# Patient Record
Sex: Female | Born: 1950
Health system: Southern US, Community
[De-identification: ages and names within clinical notes are randomized; demographics above are authoritative.]

## PROBLEM LIST (undated history)

## (undated) ENCOUNTER — Emergency Department (HOSPITAL_COMMUNITY): Admission: EM | Discharge status: Absent without leave | Payer: Self-pay

## (undated) DIAGNOSIS — R011 Cardiac murmur, unspecified: Secondary | ICD-10-CM

## (undated) DIAGNOSIS — E785 Hyperlipidemia, unspecified: Secondary | ICD-10-CM

## (undated) DIAGNOSIS — I119 Hypertensive heart disease without heart failure: Secondary | ICD-10-CM

## (undated) DIAGNOSIS — I2510 Atherosclerotic heart disease of native coronary artery without angina pectoris: Secondary | ICD-10-CM

## (undated) DIAGNOSIS — C801 Malignant (primary) neoplasm, unspecified: Secondary | ICD-10-CM

## (undated) DIAGNOSIS — I10 Essential (primary) hypertension: Secondary | ICD-10-CM

## (undated) DIAGNOSIS — I471 Supraventricular tachycardia, unspecified: Secondary | ICD-10-CM

## (undated) DIAGNOSIS — IMO0001 Reserved for inherently not codable concepts without codable children: Secondary | ICD-10-CM

## (undated) DIAGNOSIS — F419 Anxiety disorder, unspecified: Secondary | ICD-10-CM

## (undated) DIAGNOSIS — F329 Major depressive disorder, single episode, unspecified: Secondary | ICD-10-CM

## (undated) DIAGNOSIS — H269 Unspecified cataract: Secondary | ICD-10-CM

## (undated) DIAGNOSIS — R42 Dizziness and giddiness: Secondary | ICD-10-CM

## (undated) DIAGNOSIS — Z794 Long term (current) use of insulin: Secondary | ICD-10-CM

## (undated) DIAGNOSIS — E1165 Type 2 diabetes mellitus with hyperglycemia: Secondary | ICD-10-CM

## (undated) DIAGNOSIS — Z923 Personal history of irradiation: Secondary | ICD-10-CM

## (undated) DIAGNOSIS — F3289 Other specified depressive episodes: Secondary | ICD-10-CM

## (undated) HISTORY — DX: Anxiety disorder, unspecified: F41.9

## (undated) HISTORY — PX: COLONOSCOPY: SHX174

## (undated) HISTORY — PX: COLON SURGERY: SHX602

## (undated) HISTORY — DX: Dizziness and giddiness: R42

## (undated) HISTORY — DX: Malignant (primary) neoplasm, unspecified: C80.1

## (undated) HISTORY — DX: Unspecified cataract: H26.9

## (undated) HISTORY — PX: OTHER SURGICAL HISTORY: SHX169

## (undated) HISTORY — PX: CORONARY ANGIOPLASTY WITH STENT PLACEMENT: SHX49

---

## 2002-01-01 ENCOUNTER — Encounter: Payer: Self-pay | Admitting: Family Medicine

## 2002-01-01 ENCOUNTER — Inpatient Hospital Stay (HOSPITAL_COMMUNITY): Admission: EM | Admit: 2002-01-01 | Discharge: 2002-01-02 | Payer: Self-pay

## 2002-01-01 ENCOUNTER — Encounter: Payer: Self-pay | Admitting: Emergency Medicine

## 2002-09-14 ENCOUNTER — Emergency Department (HOSPITAL_COMMUNITY): Admission: EM | Admit: 2002-09-14 | Discharge: 2002-09-14 | Payer: Self-pay | Admitting: Emergency Medicine

## 2006-03-14 ENCOUNTER — Encounter: Payer: Self-pay | Admitting: Family Medicine

## 2007-02-26 ENCOUNTER — Emergency Department (HOSPITAL_COMMUNITY): Admission: EM | Admit: 2007-02-26 | Discharge: 2007-02-26 | Payer: Self-pay | Admitting: Emergency Medicine

## 2007-02-26 ENCOUNTER — Ambulatory Visit: Payer: Self-pay | Admitting: Vascular Surgery

## 2007-02-26 ENCOUNTER — Encounter (INDEPENDENT_AMBULATORY_CARE_PROVIDER_SITE_OTHER): Payer: Self-pay | Admitting: Emergency Medicine

## 2007-03-17 ENCOUNTER — Emergency Department (HOSPITAL_COMMUNITY): Admission: EM | Admit: 2007-03-17 | Discharge: 2007-03-17 | Payer: Self-pay | Admitting: Emergency Medicine

## 2007-04-04 ENCOUNTER — Emergency Department (HOSPITAL_COMMUNITY): Admission: EM | Admit: 2007-04-04 | Discharge: 2007-04-05 | Payer: Self-pay | Admitting: Emergency Medicine

## 2008-04-28 ENCOUNTER — Telehealth: Payer: Self-pay | Admitting: Family Medicine

## 2008-04-29 ENCOUNTER — Ambulatory Visit: Payer: Self-pay | Admitting: Family Medicine

## 2008-04-29 DIAGNOSIS — E785 Hyperlipidemia, unspecified: Secondary | ICD-10-CM

## 2008-04-29 DIAGNOSIS — I119 Hypertensive heart disease without heart failure: Secondary | ICD-10-CM

## 2008-04-29 DIAGNOSIS — E1159 Type 2 diabetes mellitus with other circulatory complications: Secondary | ICD-10-CM | POA: Insufficient documentation

## 2008-05-02 LAB — CONVERTED CEMR LAB
ALT: 57 units/L — ABNORMAL HIGH (ref 0–35)
AST: 37 units/L (ref 0–37)
Albumin: 4.5 g/dL (ref 3.5–5.2)
Alkaline Phosphatase: 49 units/L (ref 39–117)
Calcium: 10.3 mg/dL (ref 8.4–10.5)
Cholesterol: 223 mg/dL — ABNORMAL HIGH (ref 0–200)
Creatinine, Ser: 0.63 mg/dL (ref 0.40–1.20)
HDL: 46 mg/dL (ref >39)
Sodium: 140 meq/L (ref 135–145)
Total Bilirubin: 0.6 mg/dL (ref 0.3–1.2)
Total Protein: 7.6 g/dL (ref 6.0–8.3)
Triglycerides: 219 mg/dL — ABNORMAL HIGH (ref <150)

## 2008-06-03 ENCOUNTER — Encounter: Payer: Self-pay | Admitting: Family Medicine

## 2008-06-18 ENCOUNTER — Encounter (INDEPENDENT_AMBULATORY_CARE_PROVIDER_SITE_OTHER): Payer: Self-pay | Admitting: Cardiology

## 2008-06-18 ENCOUNTER — Inpatient Hospital Stay (HOSPITAL_COMMUNITY): Admission: EM | Admit: 2008-06-18 | Discharge: 2008-06-19 | Payer: Self-pay | Admitting: Emergency Medicine

## 2008-06-18 ENCOUNTER — Ambulatory Visit: Payer: Self-pay | Admitting: *Deleted

## 2008-06-23 ENCOUNTER — Emergency Department (HOSPITAL_COMMUNITY): Admission: EM | Admit: 2008-06-23 | Discharge: 2008-06-23 | Payer: Self-pay | Admitting: Emergency Medicine

## 2008-06-24 ENCOUNTER — Inpatient Hospital Stay (HOSPITAL_COMMUNITY): Admission: AD | Admit: 2008-06-24 | Discharge: 2008-06-25 | Payer: Self-pay | Admitting: Cardiology

## 2008-07-01 ENCOUNTER — Encounter: Payer: Self-pay | Admitting: Family Medicine

## 2008-07-15 ENCOUNTER — Inpatient Hospital Stay (HOSPITAL_COMMUNITY): Admission: AD | Admit: 2008-07-15 | Discharge: 2008-07-16 | Payer: Self-pay | Admitting: Cardiology

## 2008-08-23 ENCOUNTER — Emergency Department (HOSPITAL_COMMUNITY): Admission: EM | Admit: 2008-08-23 | Discharge: 2008-08-23 | Payer: Self-pay | Admitting: Emergency Medicine

## 2008-09-15 ENCOUNTER — Ambulatory Visit: Payer: Self-pay | Admitting: Internal Medicine

## 2008-09-15 ENCOUNTER — Inpatient Hospital Stay (HOSPITAL_COMMUNITY): Admission: EM | Admit: 2008-09-15 | Discharge: 2008-09-16 | Payer: Self-pay | Admitting: Emergency Medicine

## 2008-09-28 ENCOUNTER — Ambulatory Visit: Payer: Self-pay | Admitting: Internal Medicine

## 2008-09-28 ENCOUNTER — Observation Stay (HOSPITAL_COMMUNITY): Admission: AD | Admit: 2008-09-28 | Discharge: 2008-09-29 | Payer: Self-pay | Admitting: Internal Medicine

## 2008-09-29 IMAGING — CR DG CHEST 1V PORT
1 series · 1 of 1 positions shown · non-contrast
Comparison: none

CLINICAL DATA: Nausea. Tachycardia. ER patient.
 PORTABLE CHEST - 1 VIEW (1210 hours):

[AP]
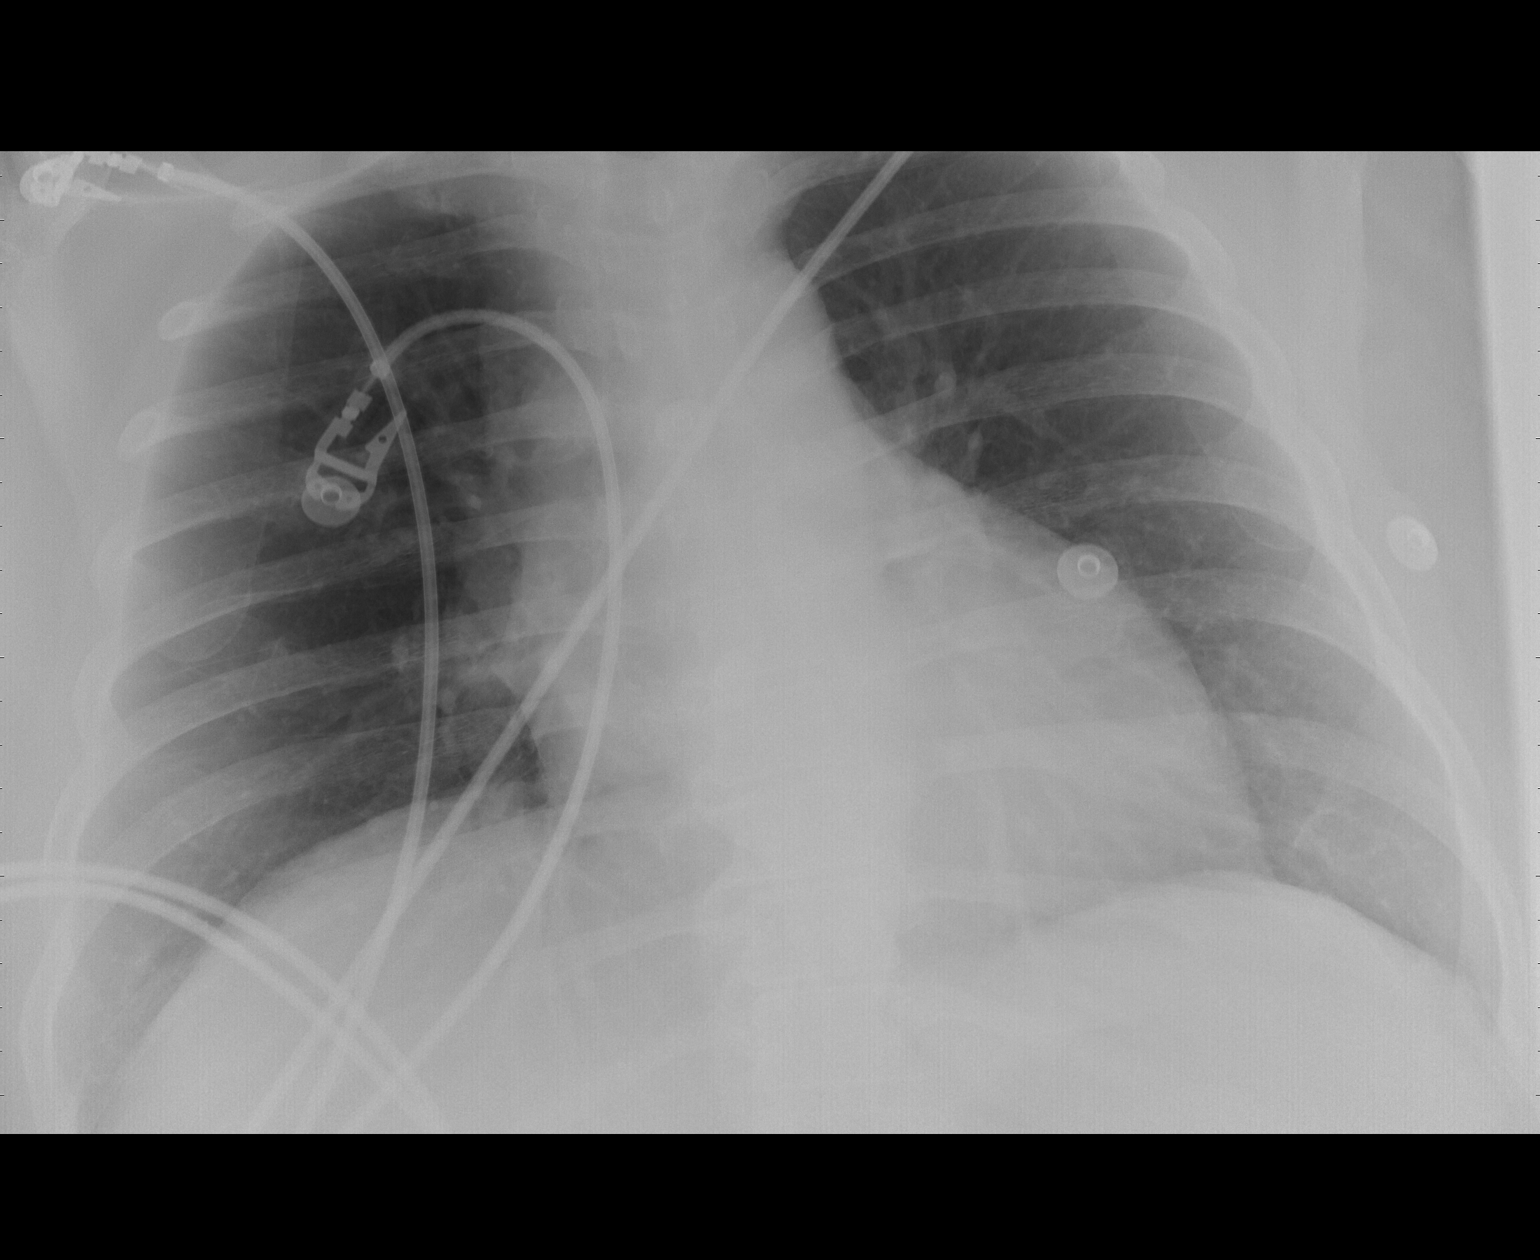

[1 of 1 positions shown; findings below may reference images not displayed]

FINDINGS: Cardiac size is normal for this single view.  No pulmonary vascular congestion or active lung process.
IMPRESSION: No acute chest disease.

## 2008-10-14 ENCOUNTER — Telehealth: Payer: Self-pay | Admitting: Internal Medicine

## 2008-10-15 DIAGNOSIS — Z8659 Personal history of other mental and behavioral disorders: Secondary | ICD-10-CM

## 2008-10-15 DIAGNOSIS — I471 Supraventricular tachycardia: Secondary | ICD-10-CM

## 2008-10-15 DIAGNOSIS — I2510 Atherosclerotic heart disease of native coronary artery without angina pectoris: Secondary | ICD-10-CM | POA: Insufficient documentation

## 2009-02-07 ENCOUNTER — Telehealth: Payer: Self-pay | Admitting: Family Medicine

## 2009-02-22 ENCOUNTER — Telehealth: Payer: Self-pay | Admitting: Family Medicine

## 2009-03-03 ENCOUNTER — Telehealth: Payer: Self-pay | Admitting: Family Medicine

## 2009-04-27 ENCOUNTER — Telehealth: Payer: Self-pay | Admitting: Family Medicine

## 2010-03-06 NOTE — Progress Notes (Signed)
Summary: Plavix BEING MAILED  Phone Note Call from Patient   Caller: Patient  [phone] Reason for Call: Talk to Nurse Summary of Call: Pt  called to adv that the manufacer [facility] [employer] patient assistance) sent her medicine (plavix, 90-day supply) to the wrong place... The med was suppose to go to [facility] but was sent to Dr Caryl Never at old location of [facility].... [facility] then put the med in the mail yesterday (04/26/2009) to be sent to [facility]... We are currently awaiting the arrival of pts med.... She would like to be contacted at [phone] when same arrives at [facility] so she can come and pick med up.   Initial call taken by: Debbra Riding,  April 27, 2009 10:20 AM  Follow-up for Phone Call        Pt informed we did receive the plavix yesterday.  Pt will pick up med next week. Follow-up by: Sid Falcon LPN,  April 27, 2009 10:43 AM

## 2010-03-06 NOTE — Progress Notes (Signed)
Summary: fyi  Phone Note Call from Patient Call back at Home Phone [phone]   Caller: Patient Call For: Evelena Peat MD Summary of Call: pt know longer has ins, she will be getting her care at health department Initial call taken by: Heron Sabins,  February 22, 2009 12:53 PM  Follow-up for Phone Call        Noted. Follow-up by: Evelena Peat MD,  February 22, 2009 12:55 PM

## 2010-03-06 NOTE — Progress Notes (Signed)
Summary: Faxed back unsigned med refill request  Phone Note From Other Clinic   Summary of Call: Received faxed refill requests for Accupril and Zoloft.  Per Dr Caryl Never, pt is no longer being treated by per earlier phone note.  These 2 requests were faxed back to [phone], Blima Singer. Initial call taken by: Sid Falcon LPN,  March 03, 2009 8:20 AM

## 2010-03-06 NOTE — Progress Notes (Signed)
Summary: plavix  Phone Note From Other Clinic   Caller: Morrie Sheldon [phone], [facility] Call For: Clorine Swing Summary of Call: Today got a bottle for a Berneda Piccininni, no date of birth, of #90 Plavix 75mg  once daily from [facility] [employer] patient [facility].  She was former patient here.  Call me back so we can get this to the right patient.   Initial call taken by: Rudy Jew, RN,  February 07, 2009 12:33 PM  Follow-up for Phone Call        This pt had been seen formerly at [facility] but has established here.  We need to call pt to get here med samples.  Pt also needs f/u here as not seen since last march. Follow-up by: Evelena Peat MD,  February 07, 2009 1:28 PM  Additional Follow-up for Phone Call Additional follow up Details #1::        Left message to inform patient. Additional Follow-up by: Rudy Jew, RN,  February 07, 2009 1:51 PM

## 2010-04-11 ENCOUNTER — Other Ambulatory Visit: Payer: Self-pay | Admitting: Obstetrics and Gynecology

## 2010-04-11 ENCOUNTER — Ambulatory Visit (HOSPITAL_COMMUNITY)
Admission: RE | Admit: 2010-04-11 | Discharge: 2010-04-11 | Disposition: A | Discharge status: Home / Self Care | Payer: Self-pay | Source: Ambulatory Visit | Attending: Obstetrics and Gynecology | Admitting: Obstetrics and Gynecology

## 2010-04-11 DIAGNOSIS — R059 Cough, unspecified: Secondary | ICD-10-CM | POA: Insufficient documentation

## 2010-04-11 DIAGNOSIS — R05 Cough: Secondary | ICD-10-CM

## 2010-05-12 LAB — BASIC METABOLIC PANEL
BUN: 12 mg/dL (ref 6–23)
Creatinine, Ser: 0.46 mg/dL (ref 0.4–1.2)
GFR calc non Af Amer: >60 mL/min (ref >60)
Glucose, Bld: 295 mg/dL — ABNORMAL HIGH (ref 70–99)
Potassium: 3.8 mEq/L (ref 3.5–5.1)

## 2010-05-12 LAB — POCT CARDIAC MARKERS
CKMB, poc: 1.3 ng/mL (ref 1.0–8.0)
CKMB, poc: 2.2 ng/mL (ref 1.0–8.0)
Myoglobin, poc: 47.7 ng/mL (ref 12–200)
Troponin i, poc: <0.05 ng/mL (ref 0.00–0.09)
Troponin i, poc: <0.05 ng/mL (ref 0.00–0.09)

## 2010-05-12 LAB — POCT I-STAT, CHEM 8
Chloride: 101 mEq/L (ref 96–112)
HCT: 39 % (ref 36.0–46.0)
Potassium: 4.1 mEq/L (ref 3.5–5.1)

## 2010-05-12 LAB — DIFFERENTIAL
Eosinophils Absolute: 0.1 10*3/uL (ref 0.0–0.7)
Eosinophils Relative: 1 % (ref 0–5)
Lymphs Abs: 1.7 10*3/uL (ref 0.7–4.0)

## 2010-05-12 LAB — COMPREHENSIVE METABOLIC PANEL
ALT: 48 U/L — ABNORMAL HIGH (ref 0–35)
AST: 35 U/L (ref 0–37)
CO2: 25 mEq/L (ref 19–32)
Calcium: 9.6 mg/dL (ref 8.4–10.5)
Chloride: 99 mEq/L (ref 96–112)
GFR calc Af Amer: >60 mL/min (ref >60)
GFR calc non Af Amer: >60 mL/min (ref >60)
Sodium: 136 mEq/L (ref 135–145)
Total Bilirubin: 0.7 mg/dL (ref 0.3–1.2)

## 2010-05-12 LAB — CBC
HCT: 39.9 % (ref 36.0–46.0)
HCT: 32.1 % — ABNORMAL LOW (ref 36.0–46.0)
Hemoglobin: 11.5 g/dL — ABNORMAL LOW (ref 12.0–15.0)
MCHC: 35.9 g/dL (ref 30.0–36.0)
MCV: 93.3 fL (ref 78.0–100.0)
Platelets: 115 10*3/uL — ABNORMAL LOW (ref 150–400)
RBC: 4.24 MIL/uL (ref 3.87–5.11)
RDW: 13.3 % (ref 11.5–15.5)
RDW: 12.7 % (ref 11.5–15.5)
WBC: 7.7 10*3/uL (ref 4.0–10.5)

## 2010-05-12 LAB — CARDIAC PANEL(CRET KIN+CKTOT+MB+TROPI)
CK, MB: 2.6 ng/mL (ref 0.3–4.0)
Total CK: 100 U/L (ref 7–177)
Total CK: 101 U/L (ref 7–177)

## 2010-05-12 LAB — HEMOGLOBIN A1C: Hgb A1c MFr Bld: 9 % — ABNORMAL HIGH (ref 4.6–6.1)

## 2010-05-12 LAB — GLUCOSE, CAPILLARY
Glucose-Capillary: 152 mg/dL — ABNORMAL HIGH (ref 70–99)
Glucose-Capillary: 159 mg/dL — ABNORMAL HIGH (ref 70–99)

## 2010-05-12 LAB — LIPID PANEL
Cholesterol: 147 mg/dL (ref 0–200)
LDL Cholesterol: UNDETERMINED mg/dL (ref 0–99)

## 2010-05-13 LAB — CBC
Hemoglobin: 14.6 g/dL (ref 12.0–15.0)
RBC: 4.42 MIL/uL (ref 3.87–5.11)
RDW: 12.8 % (ref 11.5–15.5)

## 2010-05-13 LAB — POCT I-STAT, CHEM 8
BUN: 19 mg/dL (ref 6–23)
Calcium, Ion: 1.11 mmol/L — ABNORMAL LOW (ref 1.12–1.32)
Chloride: 98 mEq/L (ref 96–112)

## 2010-05-13 LAB — DIFFERENTIAL
Basophils Absolute: 0 10*3/uL (ref 0.0–0.1)
Lymphocytes Relative: 21 % (ref 12–46)
Monocytes Absolute: 0.6 10*3/uL (ref 0.1–1.0)
Neutro Abs: 5.1 10*3/uL (ref 1.7–7.7)

## 2010-05-14 LAB — BASIC METABOLIC PANEL
Calcium: 8.8 mg/dL (ref 8.4–10.5)
Chloride: 105 mEq/L (ref 96–112)
Creatinine, Ser: 0.51 mg/dL (ref 0.4–1.2)
GFR calc Af Amer: >60 mL/min (ref >60)
GFR calc non Af Amer: >60 mL/min (ref >60)

## 2010-05-14 LAB — CBC
RBC: 3.89 MIL/uL (ref 3.87–5.11)
WBC: 4.7 10*3/uL (ref 4.0–10.5)

## 2010-05-14 LAB — GLUCOSE, CAPILLARY
Glucose-Capillary: 135 mg/dL — ABNORMAL HIGH (ref 70–99)
Glucose-Capillary: 172 mg/dL — ABNORMAL HIGH (ref 70–99)
Glucose-Capillary: 210 mg/dL — ABNORMAL HIGH (ref 70–99)

## 2010-05-15 LAB — URINALYSIS, ROUTINE W REFLEX MICROSCOPIC
Bilirubin Urine: NEGATIVE
Glucose, UA: >1000 mg/dL — AB
Hgb urine dipstick: NEGATIVE
Ketones, ur: 15 mg/dL — AB
Protein, ur: NEGATIVE mg/dL
pH: 6 (ref 5.0–8.0)

## 2010-05-15 LAB — GLUCOSE, CAPILLARY
Glucose-Capillary: 169 mg/dL — ABNORMAL HIGH (ref 70–99)
Glucose-Capillary: 179 mg/dL — ABNORMAL HIGH (ref 70–99)
Glucose-Capillary: 184 mg/dL — ABNORMAL HIGH (ref 70–99)
Glucose-Capillary: 300 mg/dL — ABNORMAL HIGH (ref 70–99)
Glucose-Capillary: 44 mg/dL — ABNORMAL LOW (ref 70–99)
Glucose-Capillary: 103 mg/dL — ABNORMAL HIGH (ref 70–99)

## 2010-05-15 LAB — CARDIAC PANEL(CRET KIN+CKTOT+MB+TROPI)
CK, MB: 12.2 ng/mL — ABNORMAL HIGH (ref 0.3–4.0)
CK, MB: 20.6 ng/mL — ABNORMAL HIGH (ref 0.3–4.0)
CK, MB: 28.5 ng/mL — ABNORMAL HIGH (ref 0.3–4.0)
Relative Index: 10.6 — ABNORMAL HIGH (ref 0.0–2.5)
Relative Index: 6.8 — ABNORMAL HIGH (ref 0.0–2.5)
Total CK: 126 U/L (ref 7–177)
Total CK: 180 U/L — ABNORMAL HIGH (ref 7–177)
Total CK: 229 U/L — ABNORMAL HIGH (ref 7–177)
Total CK: 268 U/L — ABNORMAL HIGH (ref 7–177)
Troponin I: 1.43 ng/mL (ref 0.00–0.06)
Troponin I: 2.62 ng/mL (ref 0.00–0.06)

## 2010-05-15 LAB — DIFFERENTIAL
Basophils Absolute: 0 10*3/uL (ref 0.0–0.1)
Basophils Relative: 0 % (ref 0–1)
Basophils Relative: 0 % (ref 0–1)
Basophils Relative: 0 % (ref 0–1)
Eosinophils Absolute: 0.1 10*3/uL (ref 0.0–0.7)
Eosinophils Absolute: 0.1 10*3/uL (ref 0.0–0.7)
Eosinophils Absolute: 0.1 10*3/uL (ref 0.0–0.7)
Eosinophils Relative: 1 % (ref 0–5)
Eosinophils Relative: 2 % (ref 0–5)
Lymphocytes Relative: 26 % (ref 12–46)
Lymphs Abs: 1.5 10*3/uL (ref 0.7–4.0)
Monocytes Relative: 9 % (ref 3–12)
Monocytes Relative: 7 % (ref 3–12)
Neutro Abs: 4.9 10*3/uL (ref 1.7–7.7)
Neutrophils Relative %: 57 % (ref 43–77)
Neutrophils Relative %: 70 % (ref 43–77)

## 2010-05-15 LAB — BASIC METABOLIC PANEL
BUN: 13 mg/dL (ref 6–23)
BUN: 19 mg/dL (ref 6–23)
CO2: 23 mEq/L (ref 19–32)
CO2: 27 mEq/L (ref 19–32)
Chloride: 102 mEq/L (ref 96–112)
Chloride: 102 mEq/L (ref 96–112)
Creatinine, Ser: 0.71 mg/dL (ref 0.4–1.2)
GFR calc Af Amer: >60 mL/min (ref >60)
GFR calc non Af Amer: >60 mL/min (ref >60)
GFR calc non Af Amer: >60 mL/min (ref >60)
Glucose, Bld: 212 mg/dL — ABNORMAL HIGH (ref 70–99)
Potassium: 3.7 mEq/L (ref 3.5–5.1)
Potassium: 4 mEq/L (ref 3.5–5.1)
Potassium: 3.9 mEq/L (ref 3.5–5.1)
Sodium: 139 mEq/L (ref 135–145)

## 2010-05-15 LAB — CK TOTAL AND CKMB (NOT AT ARMC)
Relative Index: 7.3 — ABNORMAL HIGH (ref 0.0–2.5)
Total CK: 273 U/L — ABNORMAL HIGH (ref 7–177)

## 2010-05-15 LAB — POCT CARDIAC MARKERS
CKMB, poc: 1 ng/mL (ref 1.0–8.0)
Myoglobin, poc: 74 ng/mL (ref 12–200)
Troponin i, poc: <0.05 ng/mL (ref 0.00–0.09)

## 2010-05-15 LAB — CBC
HCT: 33.5 % — ABNORMAL LOW (ref 36.0–46.0)
HCT: 41.1 % (ref 36.0–46.0)
HCT: 38.7 % (ref 36.0–46.0)
MCHC: 35.1 g/dL (ref 30.0–36.0)
MCHC: 35.8 g/dL (ref 30.0–36.0)
MCHC: 35.9 g/dL (ref 30.0–36.0)
MCV: 92.5 fL (ref 78.0–100.0)
MCV: 93.2 fL (ref 78.0–100.0)
MCV: 93.3 fL (ref 78.0–100.0)
Platelets: 110 10*3/uL — ABNORMAL LOW (ref 150–400)
Platelets: 117 10*3/uL — ABNORMAL LOW (ref 150–400)
Platelets: 128 10*3/uL — ABNORMAL LOW (ref 150–400)
RBC: 4.41 MIL/uL (ref 3.87–5.11)
RBC: 4.15 MIL/uL (ref 3.87–5.11)
RDW: 12.7 % (ref 11.5–15.5)
WBC: 7 10*3/uL (ref 4.0–10.5)

## 2010-05-15 LAB — LIPID PANEL
Cholesterol: 142 mg/dL (ref 0–200)
LDL Cholesterol: 83 mg/dL (ref 0–99)

## 2010-05-15 LAB — URINE MICROSCOPIC-ADD ON

## 2010-05-15 LAB — TSH: TSH: 1.046 u[IU]/mL (ref 0.350–4.500)

## 2010-05-15 LAB — PROTIME-INR: INR: 1 (ref 0.00–1.49)

## 2010-05-15 LAB — TROPONIN I
Troponin I: 1.27 ng/mL (ref 0.00–0.06)
Troponin I: 3.23 ng/mL (ref 0.00–0.06)

## 2010-06-19 NOTE — H&P (Signed)
[name], [name]               ACCOUNT NO.:  [id number]   MEDICAL RECORD NO.:  [id number]          PATIENT TYPE:  INP   LOCATION:  4705                         FACILITY:  [facility]   PHYSICIAN:  Armanda Magic, M.D.     DATE OF BIRTH:  [birth year]   DATE OF ADMISSION:  09/15/2008  DATE OF DISCHARGE:                              HISTORY & PHYSICAL   REFERRING PHYSICIAN:  Donnetta Hutching, MD   CHIEF COMPLAINT:  Chest pain and palpitations.   HISTORY OF PRESENT ILLNESS:  This is a 60 year old white female with a  history of coronary disease and SVT who presented to the emergency room  with complaints of chest pain or palpitations.  Apparently around 1  o'clock today while at school helping with Bible [employer] at church, she  noticed acute onset of chest pressure and then developed diaphoresis.  She then started to go home but then decided to go to the emergency  room.  On the way to the emergency room, she broke out in palpitations  about 5-10 minutes after the chest pressure started.  By arrival in the  emergency room, she was still having palpitations and EKG showed SVT at  157 beats per minute, most likely AVNRT.  She was given adenosine 6 mg  IV, then 12 mg IV, and converted to sinus rhythm.  Immediately after  conversion to sinus rhythm, her chest pain terminated.   PAST MEDICAL HISTORY:  1. Coronary disease status post non-ST elevation MI in May 2010 with      PCI of the left circumflex on Jun 24, 2008.  She then had a PCI of      the RCA on July 15, 2008.  2. 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  3. Diabetes mellitus.  4. Hypertension.  5. Depression.   PAST SURGICAL HISTORY:  PCI.   MEDICATIONS:  1. Lantus insulin 90 mg at bedtime.  2. Metformin 500 mg b.i.d.  3. Accupril 20 mg at bedtime.  4. Plavix 75 mg a day.  5. Metoprolol 50 mg in the morning and 25 mg in the evening.  6. Simvastatin 40 mg daily.  7. Zoloft 50 mg at bedtime.  8. Aspirin 325 mg a day.  9. Fish oil 3000 mg daily.  10.Hydrochlorothiazide 12.5 mg a day.   ALLERGIES:  None.   SOCIAL HISTORY:  She has one son.  She is single.  She denies tobacco or  alcohol use.   FAMILY HISTORY:  Noncontributory.   REVIEW OF SYSTEMS:  Otherwise stated in HPI is negative.   PHYSICAL EXAMINATION:  VITAL SIGNS:  Blood pressure is 106/47, heart  rate 74, respirations 20, and O2 saturation is 99% on 2 L O2.  GENERAL:  This is a well-developed, well-nourished female in no acute  distress.  HEENT:  Benign.  NECK:  Supple without lymphadenopathy.  Carotid upstrokes +2  bilaterally.  No bruits.  LUNGS:  Clear to auscultation throughout.  HEART:  Regular rate and rhythm.  No murmurs, rubs, or gallops.  Normal  S1-S2.  ABDOMEN:  Soft, nontender, and nondistended with active bowel sounds.  No  hepatosplenomegaly.  EXTREMITIES:  No edema.   LABORATORY DATA:  Myoglobin 78.  CPK-MB 2.2 and troponin less than 0.05.  Sodium 136, potassium 4.1, chloride 101, bicarb 24, BUN 28, creatinine  0.4, and glucose 351.  Chest x-ray shows questionable left lower lobe  retrocardiac airspace opacity, recommending further evaluation.  EKG #1  shows SVT 157 beats per minute with no ST changes.  She is now back in  sinus rhythm.   ASSESSMENT:  1. Chest pain with initial cardiac markers negative.  There was no      ischemic changes on EKG during supraventricular tachycardia.  2. Coronary artery disease status post non-ST elevation myocardial      infarction with percutaneous coronary intervention of the left      circumflex in May 2010 and percutaneous coronary intervention of      the right coronary artery in June 2010, on aspirin and Plavix.  3. Supraventricular tachycardia, now in normal sinus rhythm after      adenosine.  She has had multiple emergency room admissions for      supraventricular tachycardia which appears to be atrioventricular      nodal reentrant tachycardia.  4. Diabetes mellitus.  5. Dyslipidemia.  6.  Hypertension.   PLAN:  Admit to telemetry to rule out MI with serial cardiac enzymes,  subcu Lovenox per Pharmacy, ED consult to evaluate possible ablation.  We cannot increase her beta-blocker further secondary to decreased blood  pressure.  Sliding-scale insulin coverage.  We will continue  nitroglycerin paste 1 inch anterior chest wall q.6 h. Until she rules  out.  Continue aspirin and Plavix.  We will check a chest CT to further  evaluate abnormal chest x-ray and rule out PE as well.      Armanda Magic, M.D.  Electronically Signed     TT/MEDQ  D:  09/15/2008  T:  09/16/2008  Job:  [id number]

## 2010-06-19 NOTE — Consult Note (Signed)
[name], [name]               ACCOUNT NO.:  [id number]   MEDICAL RECORD NO.:  [id number]          PATIENT TYPE:  INP   LOCATION:  4705                         FACILITY:  [facility]   PHYSICIAN:  Doylene Canning. Ladona Ridgel, MD    DATE OF BIRTH:  08/04/1950   DATE OF CONSULTATION:  09/16/2008  DATE OF DISCHARGE:  09/16/2008                                 CONSULTATION   REQUESTING PHYSICIAN:  Armanda Magic, MD   INDICATION FOR CONSULTATION:  Evaluation of recurrent episodes of  supraventricular tachycardia.   HISTORY OF PRESENT ILLNESS:  The patient is a 60 year old woman with  known coronary artery disease and SVT.  She has had multiple  hospitalizations in the last year, both for SVT and coronary artery  disease with chest pain.  She sustained a non ST-segment elevation MI in  May 2010 and ultimately underwent percutaneous intervention of her left  circumflex and her right coronary artery.  The patient's SVT starts and  stop suddenly.  It has been terminated on multiple occasions with  intravenous adenosine and only began occurring approximately 3 years  ago.  She specifically denies SVT symptoms as a teenager or as a young  adult.  The patient states when she has SVT, she has pressure on her  chest and she gets short of breath.  She feels palpitations.  She has  had no frank syncope.  She has been on escalating doses of beta-  blockers, but despite this she has continued have episodes of SVT.   PAST MEDICAL HISTORY:  Also notable for diabetes, hypertension, and a  history of depression.   SOCIAL HISTORY:  The patient has one child.  She is single.  She denies  tobacco or ethanol abuse.   PHYSICAL EXAMINATION:  Noncontributory advanced age, but is negative for  SVT.   REVIEW OF SYSTEMS:  The patient has minimal arthritic complaints.  She  has occasional anginal symptoms.  She has tachy palpitations as  previously noted with her SVT.  She has problems with excess weight and  has been  obese for a while.  Otherwise, all systems are reviewed and  negative except as noted in the HPI.   PHYSICAL EXAMINATION:  GENERAL:  She is a pleasant middle-aged obese  woman in no acute distress.  VITAL SIGNS:  Blood pressure was 110/50.  The pulse was 80 and regular,  respirations were 18, temperature was 98.  HEENT:  Normocephalic and atraumatic.  Pupils are round.  Oropharynx  moist.  Sclerae anicteric.  NECK:  Mo jugular distention.  There is no thyromegaly.  Trachea is  midline.  Carotid are 2+ and symmetric.  LUNGS:  Clear bilaterally to auscultation.  No wheezes, rales, or  rhonchi are present.  There is no increased work of breathing.  CARDIOVASCULAR:  Regular rate and rhythm.  Normal S1 and S2.  There are  no murmurs, rubs, or gallops.  ABDOMEN:  Soft, nontender, nondistended, obese.  There is no obvious  organomegaly.  EXTREMITIES:  No cyanosis, clubbing or edema.  The pulses 2+ and  symmetric.  NEUROLOGIC:  Alert  and oriented x3.  Cranial nerves intact.  Strength  was 5/5 and symmetric.   DIAGNOSTIC STUDIES:  The EKG demonstrates sinus rhythm with no evidence  of preexcitation.  EKGs during SVT demonstrates a narrow QRS, short RP  tachycardia around 60 beats per minute.   IMPRESSION:  1. Recurrent symptomatic supraventricular tachycardia despite medical      therapy.  2. Coronary artery disease status post intervention and stents.   DISCUSSION:  I have discussed treatment options with the patient.  The  risks, benefits, goals, and expectations of electrophysiology study and  catheter ablation were discussed with the patient.  She is willing to  proceed with this procedure though she would like to do it as an  outpatient.  I will allow the patient to be discharged home and have her  rescheduled in the next several weeks.      Doylene Canning. Ladona Ridgel, MD  Electronically Signed     GWT/MEDQ  D:  09/16/2008  T:  09/17/2008  Job:  [id number]   cc:   Armanda Magic, M.D.

## 2010-06-19 NOTE — H&P (Signed)
[name], [name]               ACCOUNT NO.:  [id number]   MEDICAL RECORD NO.:  [id number]          PATIENT TYPE:  INP   LOCATION:  4735                         FACILITY:  [facility]   PHYSICIAN:  Jennelle Human. Marisue Humble, MD DATE OF BIRTH:  1950-09-28   DATE OF ADMISSION:  06/18/2008  DATE OF DISCHARGE:                              HISTORY & PHYSICAL   PRIMARY CARE PHYSICIAN:  Danielle Peat, MD, with [facility].   CHIEF COMPLAINT:  Supraventricular tachycardia.   HISTORY OF PRESENT ILLNESS:  This is a 60 year old lady with history of  tachyarrhythmias with which she had an episode today that began around  10:00 p.m., it felt as if her heart was pounding for about 30-40 minutes  before paramedics arrived and administered adenosine x2 doses.  She  subsequently broke as it appeared she was in AVNRT.  She had  premedicated with 4 baby aspirins and nitroglycerin x3, although she  does not know of any coronary artery disease.  She states that she has  had these episodes approximately 3-4 times before and they did not start  until approximately 10 years ago.  She had no chest pain, diaphoresis,  or shortness of breath.   PAST MEDICAL HISTORY:  1. Diabetes mellitus x15 years, on insulin.  2. Hyperlipidemia, combined hypertriglyceridemia and      hypercholesterolemia.  3. History of SVT.   ALLERGIES:  No known drug allergies.   MEDICATIONS:  1. Lantus 90 units nightly.  2. Accupril 20 mg daily.  3. Zoloft 50 mg daily.  4. Simvastatin 20 mg daily.  5. Metformin 500 mg b.i.d.   SOCIAL HISTORY:  She lives in [place] with her husband and her son.  She is currently retired.  No tobacco or alcohol use.   Family history is not significant for any history of early heart  disease; however, there is stroke, COPD, and diabetes that runs in the  family.   Review of systems is negative x10 except for that stated in the HPI.   PHYSICAL EXAMINATION:  VITAL SIGNS:  She is  afebrile, pulse is 80,  respirations 24, blood pressure 126/64.  GENERAL:  She is alert, awake, and oriented x3.  HEENT:  Normal.  NECK:  Normal jugular venous pressure.  Carotid upstrokes normal.  No  bruits.  CARDIOVASCULAR:  Regular rate and rhythm without murmurs, gallops, or  rubs.  LUNGS:  Clear to auscultation bilaterally.  GI:  Soft, nondistended, nontender.  Good bowel sounds.  No  hepatosplenomegaly.  EXTREMITIES:  No clubbing, cyanosis, or edema.  Dorsalis pedis,  posterior tibial pulses are normal.   Chest x-ray is normal.  ECG shows a normal sinus rhythm with a leftward  axis and poor R-wave progression.  Reviewing the strips from the field  shows that there is an RSR prime pattern during the tachycardia that is  not present in her native EKG, thus this is consistent with AVNRT.   LABORATORY DATA:  Sodium is 139, potassium is 3.9, chloride 102, bicarb  26, BUN 16, creatinine 0.57, glucose 301.  First set of cardiac enzymes,  total CK is 292, MB is 21.3, and troponin is 1.27.   ASSESSMENT AND PLAN:  1. Atrioventricular nodal reentrant tachycardia with elevated cardiac      biomarkers.  Her episode lasted approximately 30-40 minutes, and      her elevated cardiac biomarkers are likely secondary to demand      ischemia.  She will likely need an echocardiogram to assess for      structural changes.  However, I think the most important thing for      her is to get a nuclear stress test to evaluate for underlying flow-      limiting ischemic disease.  2. Diabetes mellitus.  We will continue her current management.  3. Hyperlipidemia.  We will continue her current regimen and get a      fasting lipid panel in the morning.      Jennelle Human Marisue Humble, MD  Electronically Signed     GBS/MEDQ  D:  06/18/2008  T:  06/18/2008  Job:  [id number]

## 2010-06-19 NOTE — H&P (Signed)
[name], [name]               ACCOUNT NO.:  [id number]   MEDICAL RECORD NO.:  [id number]          PATIENT TYPE:  OIB   LOCATION:  2505                         FACILITY:  [facility]   PHYSICIAN:  [name], M.D.  DATE OF BIRTH:  [birth year]   DATE OF ADMISSION:  06/24/2008  DATE OF DISCHARGE:                              HISTORY & PHYSICAL   CHIEF COMPLAINT:  Palpitations and recent elevated troponin.   [name] is a 60 year old female who was initially admitted to [facility] on Jun 18, 2008, with SVT.  She did have some elevated  troponins and we felt this was secondary to demand ischemia.  Ultimately  her SVT resolved and she had no further chest pain and we felt that it  was safe for her to go home and come back today for cardiac  catheterization.  While lying on the cath table, she does tell us that  she had a come back in last night with SVT, had to get a shot, and  then she was let to go home.   PAST MEDICAL HISTORY:  1. Diabetes mellitus.  2. Hyperlipidemia.  3. SVT.  4. Long-term medication use.  5. Depression.   ALLERGIES:  No known drug allergies.   MEDICATIONS:  1. Lantus 90 units subcu q.h.s.  2. Accupril 20 mg a day.  3. Zoloft 50 mg a day.  4. Simvastatin 20 mg a day.  5. Metformin 500 mg p.o. b.i.d. (she has held this for a week).   SOCIAL HISTORY:  She lives in [place] with her husband and her son.  She is currently retired.  She denies any tobacco, alcohol, or illicit  drug use.   FAMILY HISTORY:  Not significant for any early heart disease, but there  is a history of stroke, COPD, and diabetes that runs in the family.   Glucose 179, BUN 18, creatinine 0.5, sodium 137, potassium 4.2,  hemoglobin 14.3, hematocrit 40.3.  White count 6.3, platelets 137.  PT  12.3, INR 1.12.   PHYSICAL EXAMINATION:  VITAL SIGNS:  Pulse 90, blood pressure 138/75,  respirations 20, and O2 saturation is 97% on room air.  HEENT:  Grossly normal.  No carotid or  subclavian bruits.  No JVD or  thyromegaly.  Sclerae clear.  Conjunctivae normal.  Nares without  drainage.  CHEST:  Clear to auscultation bilaterally.  No wheezing or rhonchi.  HEART:  Regular rate and rhythm.  No obvious murmur, rub, or ectopy.  ABDOMEN:  Good bowel sounds.  Nontender and nondistended.  No masses.  No bruits.  EXTREMITIES:  No peripheral edema.  SKIN:  Warm and dry.  NEUROLOGIC:  Cranial nerves II through XII grossly intact.   ASSESSMENT AND PLAN:  1. Supraventricular tachycardia.  2. Recent elevated troponin consistent with myocardial      demand/ischemia.  3. Diabetes mellitus.  4. Hyperlipidemia.   We will wait 48 hours, then restart her metformin.  She is being  admitted today for cardiac catheterization with possible percutaneous  intervention as well as arrhythmia control.      [name],  P.A.      [name], M.D.  Electronically Signed    LB/MEDQ  D:  06/24/2008  T:  06/25/2008  Job:  [id number]

## 2010-06-19 NOTE — Consult Note (Signed)
[name], [name]               ACCOUNT NO.:  [id number]   MEDICAL RECORD NO.:  [id number]          PATIENT TYPE:  INP   LOCATION:                               FACILITY:  [facility]   PHYSICIAN:  Doylene Canning. Ladona Ridgel, MD    DATE OF BIRTH:  01-15-1951   DATE OF CONSULTATION:  09/16/2008  DATE OF DISCHARGE:                                 CONSULTATION   PRESENTING CIRCUMSTANCE:  My heart has been racing again.   HISTORY OF PRESENT ILLNESS:  [name] is a 60 year old female.  She  has a history of palpitations and heart racing.  Her first experience  with this occurred back in 2003.  She thought she had been under a lot  of stress.  She felt a range of symptoms which included palpitation,  shortness of breath, nausea, diaphoresis, flushing, and chest pressure,  but no dizziness, no presyncope, or syncope.  By the time she got to the  emergency room, the symptoms had abated and the patient was released the  next day.  Since that time, she has had recurrence of these tachy  palpitations off and they have lasted about 20-30 minutes.  Usually when  she would rest and relax, they would dissipate.  For the longest time  since 2003, they have occurred about one time a month   However, lately the symptoms of her heart racing have increased and they  have not abated with any procedures that she was familiar with.  Once  again, the symptoms are chest pressure, shortness of breath, fatigue,  diaphoresis, nausea, decreased exercise tolerance.  She relates at least  4 visits to the emergency room in the year 2010 alone.  She presented on  September 15, 2008, once again with these symptoms.  She required adenosine  6 mg and a 12 mg dose to abate these symptoms, and she says she has been  using chemical prophylaxis all through this year.   Of note, the patient did have 2 other admissions this year, once in May  for STEMI.  At that time, catheterization was done on Jun 24, 2008, that  showed two-vessel  coronary artery disease.  At the May catheterization,  she received a left circumflex stent.  At the subsequent catheterization  on July 15, 2008, she received a right coronary artery stent.  The  patient is on Plavix and aspirin for 1 year.  The patient also has a  history of diabetes insulin dependent, hypertension, and depression.   SOCIAL HISTORY:  The patient lives in [place].  She is divorced.  She does not smoke, take alcoholic beverages, or use drugs.   FAMILY HISTORY:  Her father died at age 76 of CHF and COPD.  Mother had  schizophrenia.  She has 2 sisters, both of whom do have obesity but no  coronary artery disease.   REVIEW OF SYSTEMS:  GENERAL:  The patient is not complaining of fevers,  chills, sweats when she is in a rapid tachy palpitations.  No weight  gain or loss unintentionally.  No adenopathy.  HEENT:  No epistaxis.  No  hoarseness.  No vertigo.  No photophobia.  No diplopia.  INTEGUMENT:  No  rashes.  No nonhealing ulcerations.  CARDIOPULMONARY:  Chest pressure  when she has tachy palpitations and shortness of breath.  Otherwise, she  is not dyspneic on exertion.  No orthopnea.  No paroxysmal nocturnal  dyspnea.  She does not relate edema of the lower extremities or  claudication with walking.  Definite palpitation when the patient goes  into her rapid supraventricular tachycardia.  No presyncope or syncope.  UROGENITAL:  No dysuria.  No hematuria.  Occasional nocturia.  NEUROPSYCHIATRIC:  No weakness, numbness.  She does have depression.  Somewhat anxious.  MUSCULOSKELETAL:  No arthralgias.  No effusions or  deformities.  GASTROINTESTINAL:  The patient does not have chronic  heartburn.  She is not experiencing nausea, vomiting, or diarrhea  lately.  She has never had hematemesis or melena.  ENDOCRINE:  The  patient has history of diabetes.  This is a risky equivalent as well as  her known coronary artery disease.  She does not follow a diabetic diet.    PHYSICAL EXAMINATION:  VITAL SIGNS:  Temperature 97.8, pulse was 83,  respirations 18, blood pressure 121/74, oxygen saturation 98% on room  air.  The patient's electrocardiogram shows a sinus rhythm.  There is an  electrocardiogram on admission to the emergency room which shows a  narrow complex tachycardia 158 beats per minute, consistent with SVT.  GENERAL:  Alert and oriented x3 in no acute distress.  HEENT:  Eyes, pupils equal, round, reactive to light.  Extraocular  movements are intact.  The sclerae are anicteric.  Nares are patent.  Oropharynx shows the mucous membranes are moist.  Dentition preserved.  No plaques, exudates, or erythema.  NECK:  Supple.  No carotid bruits auscultated.  No jugular venous  distention.  No cervical lymphadenopathy.  HEART:  Regular rate and rhythm without murmur.  No rubs or gallops.  PULMONARY:  Lungs are clear to auscultation bilaterally.  INTEGUMENT:  No rashes or lesions.  ABDOMEN:  Soft, nondistended.  Bowel sounds are present.  Deeper  structures not palpated.  UROGENITAL AND RECTAL:  Deferred.  EXTREMITIES:  No evidence of clubbing, cyanosis, edema.  MUSCULOSKELETAL:  No joint deformity or effusions.  NEUROLOGIC:  Alert and oriented x3.  Cranial nerves II through XII  grossly intact.  Grip strength preserved bilaterally.  Gait is normal.   CT of the chest on September 15, 2008, no pulmonary embolism.  Chest x-ray  on September 15, 2008, left lower lobe opacity, but this is not confirmed  on CT scan.  Once again electrocardiogram shows sinus rhythm with narrow  QRS.  QTc is not prolonged, and there is an electrocardiogram showing  her SVT.   LABORATORY STUDIES:  White cells 5.3, hemoglobin 11.5, hematocrit 32.1,  platelets are 298.  Sodium 136, potassium 3.8, chloride 99, carbonate  25, glucose is 345, BUN is 24, creatinine 0.63.  TSH is 0.464.  HGB A1c  is 9.  BNP this admission is 70.  Protime 13.3, INR 1.0.  Total  cholesterol 147,  triglycerides 569, HDL cholesterol 29, LDL cannot  calculate secondary to high triglycerides.  Troponin I studies are  negative x3.   Medications for this patient are as follows:  1. Lantus 90 units subcutaneously at bedtime.  2. Metformin 500 mg b.i.d.  3. Accupril 20 mg at bedtime.  4. Plavix 75 mg daily.  5. Metoprolol 50 mg q.a.m. and 25 mg  q.p.m.  6. Simvastatin 40 mg at bedtime.  7. Zoloft 50 mg daily.  8. Enteric-coated aspirin 325 mg daily.  9. Fish oil capsule 1000 mg twice daily.  10.Hydrochlorothiazide 12.5 mg daily.   The patient was seen by Dr. Ladona Ridgel who analyzed the patient's history  and the electrocardiogram.  Dr. Ladona Ridgel is happy to offer this patient  ablation of her SVT.  The risks and benefits of the procedure have been  described to the patient, and she wishes to proceed.  This procedure  will be done on September 28, 2008.  She will present to Short Stay at 7:30  in the morning.  She will be n.p.o.  IV fluids will be started.  Her  metformin and Lantus will be held.  Other medications she may take in  the morning.  She is asked to stop her metoprolol with the 50 mg a.m.  dose on Tuesday, September 28, 2008, not to take the 25 mg p.m. dose.   Once again, this patient has symptomatic supraventricular tachycardia.  Symptoms include chest pressure, shortness of breath, diaphoresis,  palpitation.      Maple Mirza, PA      Doylene Canning. Ladona Ridgel, MD  Electronically Signed    GM/MEDQ  D:  09/16/2008  T:  09/17/2008  Job:  [id number]

## 2010-06-19 NOTE — Op Note (Signed)
[name], [name]               ACCOUNT NO.:  [id number]   MEDICAL RECORD NO.:  [id number]          PATIENT TYPE:  INP   LOCATION:  4729                         FACILITY:  [facility]   PHYSICIAN:  Doylene Canning. Ladona Ridgel, MD    DATE OF BIRTH:  01/23/1951   DATE OF PROCEDURE:  09/28/2008  DATE OF DISCHARGE:                               OPERATIVE REPORT   PROCEDURE PERFORMED:  Electrophysiologic study and RF catheter ablation  of arteriovenous nodal reentrant tachycardia.   INTRODUCTION:  The patient is a 60 year old woman who had a several-year  history of recurrent tachy palpitations and documented SVT rates over  160 beats per minute.  These have been incessant as of late and  terminated with adenosine.   PROCEDURE:  After informed consent was obtained, the patient was taken  to the diagnostic EP lab in a fasting state.  After usual preparation  and draping, intravenous fentanyl and midazolam were given for sedation.  A 6-French hexapolar catheter was inserted percutaneously into the right  jugular vein and advanced to the coronary sinus.  A 5-French quadripolar  catheter was inserted percutaneously in the right femoral vein and  advanced to the right ventricle.  A 5-French quadripolar catheter was  inserted percutaneously in the right femoral vein and advanced to the  His bundle region under fluoroscopic guidance.  After measurement of the  basic intervals, rapid ventricular pacing was carried out from the RV  apex demonstrating VA Wenckebach at 300 milliseconds.  During rapid  ventricular pacing, the atrial activation sequence was midline and  decremental.  Next, programmed ventricular stimulation was carried out  from the RV apex at a base drive cycle length of 540 milliseconds.  The  S1-S2 interval was stepwise decreased down to 320 milliseconds where  ventricular refractoriness was observed.  During programmed ventricular  stimulation, the atrial activation sequence was again midline  and  decremental.  Next, programmed atrial stimulation was carried out from  the coronary sinus at a base drive cycle length of 086 milliseconds and  stepwise decreased down to 330 milliseconds resulting in the initiation  of SVT.  This was nonsustained AVNRT.  Next, rapid atrial pacing was  carried out from the coronary sinus and the high right atrium at a base  drive cycle length of 761 milliseconds and stepwise decreased down to  320 milliseconds where AV Wenckebach was observed.  During rapid atrial  pacing, the PR interval was initially greater than the RR interval  followed by the initiation of SVT.  At this point, unfortunately the SVT  was nonsustained typically lasting 6-8 beats.  Isoproterenol was then  infused at a mcg per minute, and while it was being infused, a 7-French  quadripolar ablation catheter was advanced by way of the right femoral  vein into the right atrium under fluoroscopic guidance.  Mapping was  carried out in the right atrium.  Alycia Rossetti triangle was unusually small and  anteriorly displaced.  After additional mapping, two RF energy  applications were subsequently delivered and this resulted in prolonged  accelerated junctional rhythm.  At this point, low-dose  isoproterenol 1  mcg per minute was infused and additional rapid atrial pacing and  programmed atrial stimulation of the right atrium and then the right  ventricle carried out and in the coronary sinus.  This demonstrated no  inducible SVT and no evidence of any residual slow pathway conduction.  The patient was observed for approximately 30 minutes and during this  time, additional rapid atrial pacing was carried out with and without  isoproterenol resulting in no inducible SVT and furthermore, no evidence  of any residual slow pathway conduction.  At this point, the catheters  were removed.  Hemostasis assured and the patient was returned to her  room in satisfactory condition.   COMPLICATIONS:  There  were no immediate procedure complications.   RESULTS:  A.  Baseline ECG.  Baseline ECG demonstrates sinus rhythm with  normal axis and intervals.  B.  Baseline intervals.  The sinus node cycle length was 688  milliseconds.  The HV interval was 50 milliseconds, the QRS duration was  81 milliseconds, the AH interval was 100 milliseconds.  C.  Rapid ventricular pacing.  Rapid ventricular pacing was carried from  the RV apex demonstrating a VA Wenckebach cycle length of 300  milliseconds.  During rapid ventricular pacing, the atrial activation  sequence was midline and decremental.  D.  Programmed ventricular stimulation.  Programmed ventricular  stimulation was carried out from the RV apex at base drive cycle length  of 347 milliseconds.  The S1-S2 interval was stepwise decreased from 400  milliseconds down to 220 milliseconds where ventricular refractoriness  was observed.  During programmed ventricular stimulation, the atrial  activation sequence was midline and decremental.  E.  Rapid atrial pacing.  Rapid atrial pacing was carried out from the  coronary sinus and high right atrium and stepwise decreased down to 320  milliseconds resulting in initiation of AV nodal reentrant tachycardia.  The tachycardia, however, was nonsustained.  F.  Programmed atrial stimulation.  Programmed atrial stimulation was  carried out from the coronary sinus and the high right atrium, base  drive cycle length of 425 milliseconds.  The S1-S2 interval stepwise  decreased down to 330 milliseconds where the AV node ERP was observed.  During programmed atrial stimulation, there were jumps and echo beats  and inducible SVT.  Following ablation, there were neither.  G.  Arrhythmia was observed.  1. AV nodal reentrant tachycardia initiation was with rapid atrial      pacing and programmed atrial stimulation.  The duration was      sustained.  Termination was spontaneous.  Cycle length was around      320  milliseconds.      a.     Mapping.  Mapping of the patient's SVT demonstrated a very       small Koch triangle which was anteriorly displaced.      b.     RF energy application.  Total of two RF energy applications       were delivered resulting in prolonged accelerated junctional       rhythm.  There was intact AV conduction following the ablation,       although the slow pathway was no longer present.   CONCLUSION:  This study demonstrates successful electrophysiology study  and RF catheter ablation of AV nodal reentrant tachycardia with a total  of 2 RF energy applications delivered to sites 7 and 8 in Saxon triangle  rendering the SVT noninducible.      Doylene Canning.  Ladona Ridgel, MD  Electronically Signed     GWT/MEDQ  D:  09/28/2008  T:  09/29/2008  Job:  [id number]   cc:   Armanda Magic, M.D.

## 2010-06-22 NOTE — Discharge Summary (Signed)
NAMEHARGUN, SPURLING               ACCOUNT NO.:  [id number]   MEDICAL RECORD NO.:  [id number]          PATIENT TYPE:  INP   LOCATION:  4735                         FACILITY:  [facility]   PHYSICIAN:  Francisca December, M.D.  DATE OF BIRTH:  01-17-1951   DATE OF ADMISSION:  06/18/2008  DATE OF DISCHARGE:  06/19/2008                               DISCHARGE SUMMARY   DISCHARGE DIAGNOSES:  1. Atrioventricular nodal reentrant tachycardia, resolved.  2. Elevated troponin, felt to be secondary to atrioventricular nodal      reentrant tachycardia.  3. Diabetes mellitus.  4. Hyperlipidemia.  5. Longterm medication use.   HOSPITAL COURSE:  [name] is a 60 year old female who is admitted to  the hospital on Jun 18, 2008, after having an episode of tachycardia.  She said it felt as if her heart was pounding for about 30-40 minutes.  Paramedics arrived and administered adenosine x2 doses.  She  subsequently broke as it appears she was in AVNRT.  She came into the  hospital for observation and her troponin was elevated at 2.62.  The  troponins seem to be a little high, just as in an SVT, so we wanted to  do a cardiac catheterization on the patient.  She was hesitant to have  this done, instead she will let us do an echo on her that showed normal  systolic function.  Images were inadequate for LV wall motion  assessment.   She has been discharged to home in stable, but improved condition.   LABORATORY STUDIES:  BUN 16, creatinine 0.57, hemoglobin 13.8, and  hematocrit 38.7.   DISCHARGE MEDICATIONS:  1. Lantus as before admission.  2. Accupril 20 mg a day.  3. Zoloft 50 mg a day.  4. Simvastatin 20 mg a day.  5. Metoprolol 25 mg twice a day.  6. Plavix 75 mg a day.  7. Enteric-coated aspirin 325 mg a day.  8. She was encouraged not to take the Glucophage at this time.   She is to remain on a low-sodium heart-healthy diabetic diet.  Increase  activity slowly.  Dr. Amil Amen will call the  patient for an appointment.      Guy Franco, P.A.      Francisca December, M.D.  Electronically Signed    LB/MEDQ  D:  08/02/2008  T:  08/02/2008  Job:  [id number]

## 2010-06-22 NOTE — H&P (Signed)
NAMESONNY, POTH                           ACCOUNT NO.:  [id number]   MEDICAL RECORD NO.:  [id number]                   PATIENT TYPE:  INP   LOCATION:  1824                                 FACILITY:  [facility]   PHYSICIAN:  Cassell Clement, M.D.              DATE OF BIRTH:  13-Aug-1950   DATE OF ADMISSION:  01/01/2002  DATE OF DISCHARGE:                                HISTORY & PHYSICAL   CHIEF COMPLAINT:  Chest discomfort.   HISTORY:  This is a 60 year old Caucasian female referred from [facility] today to the emergency room because of abnormal EKG and  chest pressure.  The patient states that she has not felt well for about ten  days.  About ten days ago, she felt that her heart had become overstressed  and she felt tachycardia and pressure.  She felt that if she cut back on her  workload and rested that her symptoms would improve.  She has tried to do  that over the past ten days with no improvement.   Today, she went to [facility], where her electrocardiogram  was done and showed some nonspecific changes, and she was referred to [facility] and brought by EMS.  The patient does not have any history of  clear cut angina pectoris with exertion.  There has been a feeling of  squeezing in the throat but no radiation into the arms.  There was dyspnea,  but no nausea or vomiting.  The patient has had a history of palpitations  and racing of the heart.   SOCIAL HISTORY:  Revealed that she has been divorced for twenty years.  She  has a 33 year old son.  She works as a Administrator, arts.  She enjoys  her work but finds it to be stressful at times.  She does not use alcohol or  tobacco.  She lives in [place] and enjoys gardening.  She avoids  caffeine and, although a diabetic, has been on a regular diet.  She has had  a history of elevated triglycerides in the past.   PAST MEDICAL HISTORY:  She has had no operations.  She was  hospitalized  years ago for exhaustion.   ALLERGIES:  No known drug allergies.   MEDICATIONS:  1. Lantus insulin 120 units at h.s.  2. Avandia 8 mg each a.m. and, by mistake, she has been taking 16 mg each     a.m..  3. Glucovance 5/500, one b.i.d.  4. Zoloft 50 mg h.s.  5. Altace 2.5 mg daily.  6. Aspirin 325 mg daily.   REVIEW OF SYSTEMS:  She denies any history of exertional chest pain.  There  has been no history of congestive heart failure or lung congestion or fluid  accumulation.  Gastrointestinal reveals no history of ulcers.  Appetite is  good, weight is 170 pounds.  Genitourinary reveals no dysuria.  Remainder of  review of systems is negative in detail.   FAMILY HISTORY:  The patient's father died of CHF and COPD at age 2.  Her  mother is living but is in a nursing home with schizophrenia.  She has  several overweight siblings who have not had premature coronary disease.   PHYSICAL EXAMINATION:  VITAL SIGNS:  Blood pressure is 130/65, pulse is 92  and regular.  Respirations are normal.  GENERAL:  Reveals an obese, Caucasian female in no acute distress.  SKIN:  Is warm and dry.  HEENT:  Pupils are equal and reactive to light and accommodation.  Extraocular movements are full.  Sclerae are clear.  Fundi show no  hemorrhages or exudate.  Mouth and pharynx:  Normal.  NECK:  Jugulovenous pressure normal.  Carotids normal.  CHEST:  Clear.  HEART:  Normal first and second heart sound.  There is no S3.  There is a  very faint apical systolic murmur grade 1/6.  ABDOMEN:  Soft and nontender without hepatosplenomegaly or masses.  EXTREMITIES:  Show good peripheral pulses and no phlebitis or edema.   LABORATORY DATA:  Her electrocardiogram shows normal sinus rhythm,  nonspecific ST-T wave abnormalities with low voltage but no acute ischemia  change.   Chest x-ray shows normal heart size and clear lungs.   IMPRESSION:  1. Chest discomfort with dyspnea; rule out myocardial  infarction.  2. Diabetes mellitus.  3. Hyperlipidemia and exogenous obesity, all consistent with a metabolic     syndrome.   DISPOSITION:  Admit to telemetry.  Will get serial enzymes.  Anticipate home  on November 29, if initial studies are negative.                                               Cassell Clement, M.D.    TB/MEDQ  D:  01/01/2002  T:  01/01/2002  Job:  [id number]   cc:   Evelena Peat, M.D.  [address]  [place]  [place] [address]  Fax: [phone]

## 2010-06-22 NOTE — Discharge Summary (Signed)
[name], [name]                           ACCOUNT NO.:  [id number]   MEDICAL RECORD NO.:  [id number]                   PATIENT TYPE:  INP   LOCATION:  2031                                 FACILITY:  [facility]   PHYSICIAN:  Cassell Clement, M.D.              DATE OF BIRTH:  10-29-1950   DATE OF ADMISSION:  01/01/2002  DATE OF DISCHARGE:  01/02/2002                                 DISCHARGE SUMMARY   HISTORY:  This is a 60 year old woman admitted through the emergency room  with chest pressure. She began having trouble with her heart about 10 days  ago, and she attributed at that time to increased stress. She noted that her  heart appeared to be racing, and she felt some squeezing in her throat.  There was mild dyspnea but no nausea. There was no left arm radiation. For  the last 10 days, she has felt somewhat more weak and fatigued. She became  concerned on the day of admission and went to [facility]  where she was evaluated, and electrocardiogram was felt to be abnormal, and  she was referred to [facility] ER and brought here by EMS. History reveals that  she does not smoke or drink. She is on a regular diet but is a diabetic and  has a history of hypertriglyceridemia.   PRESENT MEDICATIONS:  1. Lantus insulin 20 units a day.  2. Avandia 8 mg daily.  3. Glucovance 5/500 one twice a day.  4. Zoloft 50 mg daily.  5. Altace 2.5 mg daily.  6. Ecotrin at 325 daily.   FAMILY HISTORY:  She has a positive family history for heart problems.   REVIEW OF SYMPTOMS:  Positive for obesity and diabetes, and she is not  careful with her diet.   PHYSICAL EXAMINATION:  Unremarkable except for a soft systolic ejection  murmur at the left sternal edge.   LABORATORY DATA:  The chest x-ray showed normal heart size, clear lungs. EKG  showed low voltage QRS but otherwise unremarkable EKG. There were minimal  nonspecific ST-T wave abnormalities but no acute ischemic changes.   HOSPITAL COURSE:  The patient was admitted to telemetry. Serial enzymes were  obtained and showed no evidence of myocardial dysfunction or damage. By the  day after admission which was 01/02/02, rhythm had remained stable  overnight, a repeat EKG was normal, and her exam was unchanged except that  the systolic murmur was slightly louder in sitting position. Thyroid studies  and lipid studies are still pending at the time of this dictation.   This being the weekend, it was felt it would be reasonable to discharge the  patient home to be followed up in the office. Depending on clinical course,  we may wish to do a Cardiolite study on her as an outpatient and also  consider a 2-D echocardiogram if heart murmur remains a concern.    DISCHARGE  INSTRUCTIONS:  The patient is being discharged on her same  medication except that we have increased her Altace to 5 mg daily, and we  have added Nitrostat 1/150 sublingually p.r.n. She is to continue on a more  strict diet and must loose weight. She will be rechecked in the office in  two weeks or sooner p.r.n.                                               Cassell Clement, M.D.    TB/MEDQ  D:  01/02/2002  T:  01/02/2002  Job:  [id number]   cc:   Evelena Peat, M.D.  [address]  [place]  [place] [address]  Fax: [phone]

## 2010-10-25 LAB — APTT: aPTT: 25

## 2010-10-25 LAB — I-STAT 8, (EC8 V) (CONVERTED LAB)
Acid-base deficit: 4 — ABNORMAL HIGH
BUN: 12
Bicarbonate: 19.7 — ABNORMAL LOW
Chloride: 107
Glucose, Bld: 158 — ABNORMAL HIGH
HCT: 35 — ABNORMAL LOW
Hemoglobin: 11.9 — ABNORMAL LOW
Operator id: 198171
Potassium: 4
Sodium: 136
TCO2: 21
pCO2, Ven: 31 — ABNORMAL LOW
pH, Ven: 7.411 — ABNORMAL HIGH

## 2010-10-25 LAB — PROTIME-INR: INR: 1

## 2010-10-25 LAB — POCT I-STAT CREATININE
Creatinine, Ser: 0.4
Operator id: 198171

## 2010-10-25 LAB — CBC
MCV: 92.1
RBC: 3.76 — ABNORMAL LOW
WBC: 8.6

## 2010-10-25 LAB — DIFFERENTIAL
Eosinophils Absolute: 0.1
Lymphocytes Relative: 18
Lymphs Abs: 1.6
Monocytes Relative: 8
Neutrophils Relative %: 72

## 2010-10-26 LAB — POCT CARDIAC MARKERS
CKMB, poc: 1.8
Operator id: 277751
Troponin i, poc: <0.05

## 2010-10-26 LAB — DIFFERENTIAL
Basophils Relative: 0
Eosinophils Absolute: 0.1
Eosinophils Relative: 2
Neutrophils Relative %: 66

## 2010-10-26 LAB — I-STAT 8, (EC8 V) (CONVERTED LAB)
Acid-base deficit: 3 — ABNORMAL HIGH
Glucose, Bld: 184 — ABNORMAL HIGH
TCO2: 24
pCO2, Ven: 39.7 — ABNORMAL LOW
pH, Ven: 7.359 — ABNORMAL HIGH

## 2010-10-26 LAB — CBC
MCHC: 35.2
MCV: 92
Platelets: 152
RDW: 13.4

## 2010-10-26 LAB — POCT I-STAT CREATININE
Creatinine, Ser: 0.6
Operator id: 277751

## 2010-10-29 LAB — I-STAT 8, (EC8 V) (CONVERTED LAB)
Acid-base deficit: 2
Chloride: 103
HCT: 38
Hemoglobin: 12.9
Operator id: 222501
Potassium: 3.8
Sodium: 136
TCO2: 23
pH, Ven: 7.414 — ABNORMAL HIGH

## 2010-10-29 LAB — POCT CARDIAC MARKERS
CKMB, poc: <1 — ABNORMAL LOW
Myoglobin, poc: 25.7
Myoglobin, poc: 32.5
Operator id: 222501
Troponin i, poc: <0.05

## 2010-10-29 LAB — DIFFERENTIAL
Basophils Absolute: 0
Eosinophils Relative: 2
Lymphocytes Relative: 33
Lymphs Abs: 2.1
Neutro Abs: 3.6

## 2010-10-29 LAB — POCT I-STAT CREATININE: Operator id: 222501

## 2010-10-29 LAB — CBC
HCT: 36.6
Platelets: 135 — ABNORMAL LOW
RDW: 13.6
WBC: 6.5

## 2010-10-29 LAB — D-DIMER, QUANTITATIVE: D-Dimer, Quant: 0.39

## 2011-01-24 ENCOUNTER — Other Ambulatory Visit: Payer: Self-pay

## 2011-01-24 ENCOUNTER — Emergency Department (HOSPITAL_COMMUNITY)
Admission: EM | Admit: 2011-01-24 | Discharge: 2011-01-24 | Disposition: A | Discharge status: Home / Self Care | Payer: Self-pay | Attending: Emergency Medicine | Admitting: Emergency Medicine

## 2011-01-24 DIAGNOSIS — M5430 Sciatica, unspecified side: Secondary | ICD-10-CM | POA: Insufficient documentation

## 2011-01-24 DIAGNOSIS — I10 Essential (primary) hypertension: Secondary | ICD-10-CM | POA: Insufficient documentation

## 2011-01-24 DIAGNOSIS — Z794 Long term (current) use of insulin: Secondary | ICD-10-CM | POA: Insufficient documentation

## 2011-01-24 DIAGNOSIS — E119 Type 2 diabetes mellitus without complications: Secondary | ICD-10-CM | POA: Insufficient documentation

## 2011-01-24 DIAGNOSIS — R110 Nausea: Secondary | ICD-10-CM | POA: Insufficient documentation

## 2011-01-24 DIAGNOSIS — M25559 Pain in unspecified hip: Secondary | ICD-10-CM | POA: Insufficient documentation

## 2011-01-24 DIAGNOSIS — M79609 Pain in unspecified limb: Secondary | ICD-10-CM | POA: Insufficient documentation

## 2011-01-24 DIAGNOSIS — I252 Old myocardial infarction: Secondary | ICD-10-CM | POA: Insufficient documentation

## 2011-01-24 DIAGNOSIS — R61 Generalized hyperhidrosis: Secondary | ICD-10-CM | POA: Insufficient documentation

## 2011-01-24 DIAGNOSIS — R42 Dizziness and giddiness: Secondary | ICD-10-CM | POA: Insufficient documentation

## 2011-01-24 HISTORY — DX: Essential (primary) hypertension: I10

## 2011-01-24 LAB — BASIC METABOLIC PANEL
Calcium: 9.3 mg/dL (ref 8.4–10.5)
Creatinine, Ser: 0.38 mg/dL — ABNORMAL LOW (ref 0.50–1.10)
GFR calc non Af Amer: >90 mL/min (ref >90)
Glucose, Bld: 339 mg/dL — ABNORMAL HIGH (ref 70–99)
Sodium: 133 mEq/L — ABNORMAL LOW (ref 135–145)

## 2011-01-24 LAB — DIFFERENTIAL
Basophils Absolute: 0 10*3/uL (ref 0.0–0.1)
Eosinophils Absolute: 0.1 10*3/uL (ref 0.0–0.7)
Eosinophils Relative: 1 % (ref 0–5)
Monocytes Absolute: 0.5 10*3/uL (ref 0.1–1.0)

## 2011-01-24 LAB — CBC
MCH: 32.4 pg (ref 26.0–34.0)
MCV: 89 fL (ref 78.0–100.0)
Platelets: 114 10*3/uL — ABNORMAL LOW (ref 150–400)
RDW: 13.2 % (ref 11.5–15.5)

## 2011-01-24 LAB — POCT I-STAT TROPONIN I: Troponin i, poc: 0.01 ng/mL (ref 0.00–0.08)

## 2011-01-24 LAB — GLUCOSE, CAPILLARY
Glucose-Capillary: 343 mg/dL — ABNORMAL HIGH (ref 70–99)
Glucose-Capillary: 272 mg/dL — ABNORMAL HIGH (ref 70–99)

## 2011-01-24 MED ORDER — FENTANYL CITRATE 0.05 MG/ML IJ SOLN
50.0000 ug | Freq: Once | INTRAMUSCULAR | Status: AC
  Administered 2011-01-24: 50 ug via INTRAVENOUS
  Filled 2011-01-24: qty 2

## 2011-01-24 MED ORDER — DIAZEPAM 5 MG/ML IJ SOLN
5.0000 mg | Freq: Once | INTRAMUSCULAR | Status: AC
  Administered 2011-01-24: 5 mg via INTRAVENOUS
  Filled 2011-01-24: qty 2

## 2011-01-24 MED ORDER — ONDANSETRON HCL 4 MG/2ML IJ SOLN
4.0000 mg | Freq: Once | INTRAMUSCULAR | Status: AC
  Administered 2011-01-24: 4 mg via INTRAVENOUS
  Filled 2011-01-24: qty 2

## 2011-01-24 MED ORDER — KETOROLAC TROMETHAMINE 30 MG/ML IJ SOLN
30.0000 mg | Freq: Once | INTRAMUSCULAR | Status: AC
  Administered 2011-01-24: 30 mg via INTRAVENOUS
  Filled 2011-01-24: qty 1

## 2011-01-24 MED ORDER — NAPROXEN 500 MG PO TABS: 500.0000 mg | ORAL_TABLET | Freq: Two times a day (BID) | ORAL | Status: AC

## 2011-01-24 MED ORDER — SODIUM CHLORIDE 0.9 % IV BOLUS (SEPSIS)
1000.0000 mL | Freq: Once | INTRAVENOUS | Status: AC
  Administered 2011-01-24: 1000 mL via INTRAVENOUS

## 2011-01-24 MED ORDER — HYDROCODONE-ACETAMINOPHEN 5-500 MG PO TABS: 1.0000 | ORAL_TABLET | Freq: Four times a day (QID) | ORAL | Status: AC | PRN

## 2011-01-24 NOTE — ED Provider Notes (Signed)
Medical screening examination/treatment/procedure(s) were conducted as a shared visit with non-physician practitioner(s) and myself.  I personally evaluated the patient during the encounter  L hip and leg pain x 4 days, acutely worsened today and was associated with nausea, dizziness, and flushing.  Similar symptoms to previous MI but without chest pain.  No weakness, numbness, tingling, bowel or bladder incontinence.  5/5 strength throughout, bilateral great toes upgoing.  EKG without changes  Glynn Octave, MD 01/24/11 [phone]

## 2011-01-24 NOTE — ED Notes (Signed)
Catheter intact on IV removal

## 2011-01-24 NOTE — ED Notes (Signed)
Notified PA CBG [id number]

## 2011-01-24 NOTE — ED Notes (Signed)
Notified RN CBG [id number]

## 2011-01-24 NOTE — ED Notes (Signed)
Patient presents with left hip pain x 3 days with pain now radiating down left leg since last night.  Patient also reported a sudden onset of dizziness, diaphoresis and nausea this AM.

## 2011-01-24 NOTE — ED Provider Notes (Signed)
History     CSN: [id number]  Arrival date & time 01/24/11  1054   First MD Initiated Contact with Patient 01/24/11 1111      Chief Complaint  Patient presents with  . Dizziness  . Nausea  . Hip Pain    (Consider location/radiation/quality/duration/timing/severity/associated sxs/prior treatment) HPI  Patient with history of non-STEMI MI in May of 2010 with stent placement x 2 is followed by [facility] cardiology presents to emergency department complaining of a four-day history of left lateral hip/buttocks pain with radiation of pain down into left inner thigh with radiation to into left lower leg towards great toe stating that the pain was not improved with Tylenol however after taking a hot bath at 5 AM temporary resolution of pain until she was sitting in her desk chair at 9 AM doing office work when she had acute onset sharp pain in her hip followed by nausea, dizziness, and then feeling flushed and diaphoretic. Patient states that when she became flushed and diaphoretic along with the nausea and the dizziness she became worried because she had her in 9 she had similar symptoms however at that time she also had "crushing chest pain" and patient denies any chest pain throughout episode today. Patient states since arriving to the emergency room in laying down the dizziness and nausea has significantly improved as well as her hip pain. Patient denies any known history of injury to her hip, loss of bowel or bladder function, saddle seat paresthesias, extremity weakness/tingling/numbness, skin changes of extremity to include swelling, redness, or heat. Pain in hip aggravated by movement and prolonged sitting and improved with hot bath and lying down. Patient denies visual changes, headache, facial droop, slurred speech, extremity weakness, or loss of coordination. Patient states dizziness was like a "light headedness" denying the room spinning about.   Past Medical History  Diagnosis Date  .  Diabetes mellitus   . Hypertension     Past Surgical History  Procedure Date  . Coronary angioplasty with stent placement     History reviewed. No pertinent family history.  History  Substance Use Topics  . Smoking status: Never Smoker   . Smokeless tobacco: Not on file  . Alcohol Use: No    OB History    Grav Para Term Preterm Abortions TAB SAB Ect Mult Living                  Review of Systems  All other systems reviewed and are negative.    Allergies  Review of patient's allergies indicates no known allergies.  Home Medications   Current Outpatient Rx  Name Route Sig Dispense Refill  . ASPIRIN EC 81 MG PO TBEC Oral Take 81 mg by mouth at bedtime.      . CLOPIDOGREL BISULFATE 75 MG PO TABS Oral Take 75 mg by mouth daily.      . OMEGA-3 FATTY ACIDS 1000 MG PO CAPS Oral Take 2 g by mouth 2 (two) times daily.      Marland Kitchen HYDROCHLOROTHIAZIDE 12.5 MG PO CAPS Oral Take 12.5 mg by mouth daily.      . INSULIN DETEMIR 100 UNIT/ML Burr SOLN Subcutaneous Inject 100 Units into the skin at bedtime.      Marland Kitchen METFORMIN HCL 500 MG PO TABS Oral Take 500 mg by mouth 2 (two) times daily with a meal.      . METOPROLOL TARTRATE 50 MG PO TABS Oral Take 50 mg by mouth 2 (two) times daily.      Marland Kitchen  QUINAPRIL HCL 20 MG PO TABS Oral Take 20 mg by mouth at bedtime.      . SERTRALINE HCL 50 MG PO TABS Oral Take 50 mg by mouth daily.      Marland Kitchen SIMVASTATIN 40 MG PO TABS Oral Take 40 mg by mouth at bedtime.        BP 147/92  Pulse 65  Temp(Src) 98.5 F (36.9 C) (Oral)  Resp 16  SpO2 100%  Physical Exam  Nursing note and vitals reviewed. Constitutional: She is oriented to person, place, and time. She appears well-developed and well-nourished. No distress.  HENT:  Head: Normocephalic and atraumatic.  Eyes: Conjunctivae and EOM are normal. Pupils are equal, round, and reactive to light.  Neck: Normal range of motion. Neck supple.  Cardiovascular: Normal rate, regular rhythm, normal heart sounds and  intact distal pulses.  Exam reveals no gallop and no friction rub.   No murmur heard. Pulmonary/Chest: Effort normal and breath sounds normal. No respiratory distress. She has no wheezes. She has no rales. She exhibits no tenderness.  Abdominal: Bowel sounds are normal. She exhibits no distension and no mass. There is no tenderness. There is no rebound and no guarding.  Musculoskeletal: Normal range of motion. She exhibits tenderness. She exhibits no edema.       Tenderness to palpation of left lateral hip/buttocks but no skin changes or crepitus. No tenderness to palpation of entire left lower extremity with no skin changes or edema. Full range of motion of left lower extremity with mild pain in lateral hip but 5 out of 5 strength. No inguinal tenderness to palpation or mass. Good femoral pulses and pedal pulses.  Neurological: She is alert and oriented to person, place, and time. She has normal reflexes. She displays normal reflexes. No cranial nerve deficit. Coordination normal.  Skin: Skin is warm and dry. No rash noted. She is not diaphoretic. No erythema.  Psychiatric: She has a normal mood and affect.    ED Course  Procedures (including critical care time)  IV fentanyl and Zofran, by mouth Valium. IV fluid bolus  IV fentanyl and Toradol.    Date: 01/24/2011  Rate: 65  Rhythm: normal sinus rhythm  QRS Axis: normal  Intervals: normal  ST/T Wave abnormalities: normal, non specific T wave changes  Conduction Disutrbances:none  Narrative Interpretation:   Old EKG Reviewed: compare to Sep 29, 2008    Labs Reviewed  CBC - Abnormal; Notable for the following:    MCHC 36.4 (*)    Platelets 114 (*) PLATELET COUNT CONFIRMED BY SMEAR   All other components within normal limits  BASIC METABOLIC PANEL - Abnormal; Notable for the following:    Sodium 133 (*)    Glucose, Bld 339 (*)    Creatinine, Ser 0.38 (*)    All other components within normal limits  GLUCOSE, CAPILLARY - Abnormal;  Notable for the following:    Glucose-Capillary 343 (*)    All other components within normal limits  GLUCOSE, CAPILLARY - Abnormal; Notable for the following:    Glucose-Capillary 272 (*)    All other components within normal limits  DIFFERENTIAL  POCT I-STAT TROPONIN I  POCT I-STAT TROPONIN I  I-STAT TROPONIN I  POCT CBG MONITORING  I-STAT TROPONIN I  POCT CBG MONITORING   No results found.   1. Sciatica   2. Diabetes mellitus     2:29 PM I spoke with Dr. Garnette Scheuermann for [facility] who states that with out CP throughout  event and patient became symptomatic of nausea, diaphoresis and dizziness acutely after severe pain that it is appropriate to perform 2 sets of troponins with her EKG findings of only slight non specific T wave changes and allow patient to follow up with cardiology as an outpatient with pain management for sciatica by PCP.   MDM  No chest pain throughout ER stay with 2 negative troponins. No signs or symptoms of cauda equina or central cord compression with no known injury to hip. No signs of septic joint. Left lower extremities neurovascularly intact. The neurofocal findings. Patient ambulating without difficulty. Patient is agreeable to following up with her primary care physician for further evaluation and management of her diabetes in for further evaluation and management of sciatica however to return to emergency department for emergent changing or worsening of symptoms as well as following up with cardiology for further evaluation but returning to emergency department for emergent changing or worsening of symptoms.        Jenness Corner, PA 01/24/11 1500

## 2011-03-06 ENCOUNTER — Ambulatory Visit (INDEPENDENT_AMBULATORY_CARE_PROVIDER_SITE_OTHER): Payer: Self-pay | Admitting: Family Medicine

## 2011-03-06 ENCOUNTER — Encounter: Payer: Self-pay | Admitting: Family Medicine

## 2011-03-06 DIAGNOSIS — R42 Dizziness and giddiness: Secondary | ICD-10-CM

## 2011-03-06 DIAGNOSIS — G5700 Lesion of sciatic nerve, unspecified lower limb: Secondary | ICD-10-CM

## 2011-03-06 DIAGNOSIS — E119 Type 2 diabetes mellitus without complications: Secondary | ICD-10-CM

## 2011-03-06 DIAGNOSIS — M5430 Sciatica, unspecified side: Secondary | ICD-10-CM

## 2011-03-06 DIAGNOSIS — I10 Essential (primary) hypertension: Secondary | ICD-10-CM

## 2011-03-06 NOTE — Patient Instructions (Signed)
You need to followup with your primary provider at [facility] Department within the next week or 2 to have blood pressure reassessed with elevated reading today of 180/80

## 2011-03-06 NOTE — Progress Notes (Signed)
  Subjective:    Patient ID: [name], female    DOB: [birth year], 61 y.o.   MRN: [id number]  HPI  Patient has not been seen here in over a couple of years. She has multiple chronic medical problems include type 2 diabetes, hyperlipidemia, depression, hypertension, and CAD history. She has been basically followed at [facility] Department because of lack of insurance and lack of funds. She's had multiple emergency department visits over the past couple of years. Seen basically today for consultation requesting a letter.  She relates she has plane tickets to fly to the [place] in March. However, she had change in health status since her plane ticket purchased and is trying to get a refund. She's had some recent increased vertigo. No hearing changes. Also some left sciatica symptoms. Also her blood pressure is poorly controlled today. She done any recent or current chest pain. She was placed recently on Naprosyn for her sciatica symptoms and that seems to be helping.   Diabetes and poorly controlled. She is followed through the health Department regarding this. History of poor compliance with medications.  Past Medical History  Diagnosis Date  . Diabetes mellitus   . Hypertension    Past Surgical History  Procedure Date  . Coronary angioplasty with stent placement     reports that she has never smoked. She does not have any smokeless tobacco history on file. She reports that she does not drink alcohol or use illicit drugs. family history is not on file. No Known Allergies   Review of Systems  Constitutional: Negative for fatigue.  Eyes: Negative for visual disturbance.  Respiratory: Negative for cough, chest tightness, shortness of breath and wheezing.   Cardiovascular: Negative for chest pain, palpitations and leg swelling.  Musculoskeletal: Positive for back pain.  Neurological: Positive for dizziness. Negative for seizures, syncope, weakness, light-headedness and  headaches.       Objective:   Physical Exam  Constitutional: She is oriented to person, place, and time. She appears well-developed and well-nourished.  HENT:  Mouth/Throat: Oropharynx is clear and moist.  Neck: Neck supple. No thyromegaly present.  Cardiovascular: Normal rate and regular rhythm.   Pulmonary/Chest: Effort normal and breath sounds normal. No respiratory distress. She has no wheezes. She has no rales.  Musculoskeletal: She exhibits no edema.  Lymphadenopathy:    She has no cervical adenopathy.  Neurological: She is alert and oriented to person, place, and time. No cranial nerve deficit.       No ataxia.  Skin: No rash noted.  Psychiatric: She has a normal mood and affect. Her behavior is normal.          Assessment & Plan:  #1 left-sided sciatica symptoms. Improved on naproxen. #2 vertigo with nonfocal exam. Probably benign positional vertigo  #3 hypertension poorly controlled. She is encouraged to followup promptly in the next week or 2 with [facility] Department who is providing her primary care at this time

## 2011-12-23 ENCOUNTER — Encounter: Payer: Self-pay | Admitting: Family Medicine

## 2011-12-23 ENCOUNTER — Ambulatory Visit (INDEPENDENT_AMBULATORY_CARE_PROVIDER_SITE_OTHER): Payer: Medicaid Other | Admitting: Family Medicine

## 2011-12-23 VITALS — BP 130/68 | Temp 98.6°F | Wt 159.0 lb

## 2011-12-23 DIAGNOSIS — K219 Gastro-esophageal reflux disease without esophagitis: Secondary | ICD-10-CM

## 2011-12-23 NOTE — Progress Notes (Signed)
Subjective:     Patient ID: [name], female   DOB: 16-Apr-1950, 61 y.o.   MRN: [id number]  HPI 61 year old with history of T2DM, HTN, hyperlipidemia and CAD here for evaluation of RUQ and post-sternal discomfort.  Describes a burning sensation periodically that extends continuously from RUQ to epigastrium, behind sternum and into the back of her throat.  She has been experiencing this for roughly 3 months and is concerned about it being a gall bladder issue.  Denies that it is colicky, or associated with fever, and she has not vomited.  Sometimes makes her nauseated.  No hematemesis, melena or grossly bloody stool.  No particular aggravating factors, including large meals, or fried/fatty foods.  Has not noticed any relieving factors.  No changes in bowel habits--she describes baseline loose stools as a result of the diabetes medications she takes, including metformin and insulin.  No dysphagia or unexpected weight loss.  Review of Systems  Constitutional: Negative for fever, diaphoresis and unexpected weight change.  Respiratory: Negative for chest tightness and shortness of breath.   Cardiovascular: Negative for chest pain.  Gastrointestinal: Positive for nausea (per HPI) and abdominal pain (as per HPI.). Negative for vomiting, diarrhea, constipation and blood in stool.       Objective:   Physical Exam  Constitutional: She is oriented to person, place, and time. She appears well-developed and well-nourished. She does not appear ill. No distress.  Cardiovascular: Normal rate, regular rhythm and normal heart sounds.   Pulmonary/Chest: Effort normal and breath sounds normal.  Abdominal: Soft. Bowel sounds are normal. She exhibits no distension and no mass. There is no tenderness. There is no rebound and no guarding.  Neurological: She is alert and oriented to person, place, and time.  Skin: Skin is warm and dry.  Psychiatric: She has a normal mood and affect.       Assessment:       61 year old with T2DM, HTN, hyperlipidemia, and CAD here for evaluation of RUQ discomfort.    Plan:     1. RUQ discomfort: likely GERD.  Pt describes discomfort as burning, and denies that it is ever colicky, or radiating to her back, accompanied by fever, worse at night, or that it has caused her to vomit, which make a gallbladder process less likely.  Will continue to monitor for development of gallbladder symptoms however.  Educate on lifestyle modifications for GERD, including limiting spicy or acidic foods, raising the head of the bed 6-8 inches, and losing weight.  Also start daily famotidine or ranitidine for several weeks to help control symptoms.  Follow up if these do not relieve symptoms, or intense, colicky RUQ pain with or without radiation to the back, vomiting, or fever develop, as this would warrant further workup for potential gallbladder process.  Marthann Schiller, MS3     Agree with assessment and plan as per Marthann Schiller, MS 3 She has radiation pattern toward sternum and burning quality which suggest reflux.  No chest tightness or exertional feature to suggest cardiac. Pt needs to lose some weight but she admits current motivation low. Evelena Peat MD

## 2011-12-23 NOTE — Patient Instructions (Addendum)
Try Pepcid (famotidine) or Zantac (ranitidine) everyday for several weeks to see if this helps relieve your symptoms. In addition, see the information below for other ways you may be able to improve your symptoms. If you start to feel sharp right-sided abdominal pain or cramps, fever, vomiting, or pain radiating to your back, follow up immediately for further evaluation.    Gastroesophageal Reflux Disease, Adult Gastroesophageal reflux disease (GERD) happens when acid from your stomach flows up into the esophagus. When acid comes in contact with the esophagus, the acid causes soreness (inflammation) in the esophagus. Over time, GERD may create small holes (ulcers) in the lining of the esophagus. CAUSES   Increased body weight. This puts pressure on the stomach, making acid rise from the stomach into the esophagus.  Smoking. This increases acid production in the stomach.  Drinking alcohol. This causes decreased pressure in the lower esophageal sphincter (valve or ring of muscle between the esophagus and stomach), allowing acid from the stomach into the esophagus.  Late evening meals and a full stomach. This increases pressure and acid production in the stomach.  A malformed lower esophageal sphincter. Sometimes, no cause is found. SYMPTOMS   Burning pain in the lower part of the mid-chest behind the breastbone and in the mid-stomach area. This may occur twice a week or more often.  Trouble swallowing.  Sore throat.  Dry cough.  Asthma-like symptoms including chest tightness, shortness of breath, or wheezing. DIAGNOSIS  Your caregiver may be able to diagnose GERD based on your symptoms. In some cases, X-rays and other tests may be done to check for complications or to check the condition of your stomach and esophagus. TREATMENT  Your caregiver may recommend over-the-counter or prescription medicines to help decrease acid production. Ask your caregiver before starting or adding any new  medicines.  HOME CARE INSTRUCTIONS   Change the factors that you can control. Ask your caregiver for guidance concerning weight loss, quitting smoking, and alcohol consumption.  Avoid foods and drinks that make your symptoms worse, such as:  Caffeine or alcoholic drinks.  Chocolate.  Peppermint or mint flavorings.  Garlic and onions.  Spicy foods.  Citrus fruits, such as oranges, lemons, or limes.  Tomato-based foods such as sauce, chili, salsa, and pizza.  Fried and fatty foods.  Avoid lying down for the 3 hours prior to your bedtime or prior to taking a nap.  Eat small, frequent meals instead of large meals.  Wear loose-fitting clothing. Do not wear anything tight around your waist that causes pressure on your stomach.  Raise the head of your bed 6 to 8 inches with wood blocks to help you sleep. Extra pillows will not help.  Only take over-the-counter or prescription medicines for pain, discomfort, or fever as directed by your caregiver.  Do not take aspirin, ibuprofen, or other nonsteroidal anti-inflammatory drugs (NSAIDs). SEEK IMMEDIATE MEDICAL CARE IF:   You have pain in your arms, neck, jaw, teeth, or back.  Your pain increases or changes in intensity or duration.  You develop nausea, vomiting, or sweating (diaphoresis).  You develop shortness of breath, or you faint.  Your vomit is green, yellow, black, or looks like coffee grounds or blood.  Your stool is red, bloody, or black. These symptoms could be signs of other problems, such as heart disease, gastric bleeding, or esophageal bleeding. MAKE SURE YOU:   Understand these instructions.  Will watch your condition.  Will get help right away if you are not doing well  or get worse. Document Released: 10/31/2004 Document Revised: 04/15/2011 Document Reviewed: 08/10/2010 [facility] Patient Information 2013 [place], [place].   Diet for Gastroesophageal Reflux Disease, Adult Reflux (acid reflux) is when acid  from your stomach flows up into the esophagus. When acid comes in contact with the esophagus, the acid causes irritation and soreness (inflammation) in the esophagus. When reflux happens often or so severely that it causes damage to the esophagus, it is called gastroesophageal reflux disease (GERD). Nutrition therapy can help ease the discomfort of GERD. FOODS OR DRINKS TO AVOID OR LIMIT  Smoking or chewing tobacco. Nicotine is one of the most potent stimulants to acid production in the gastrointestinal tract.  Caffeinated and decaffeinated coffee and black tea.  Regular or low-calorie carbonated beverages or energy drinks (caffeine-free carbonated beverages are allowed).   Strong spices, such as black pepper, white pepper, red pepper, cayenne, curry powder, and chili powder.  Peppermint or spearmint.  Chocolate.  High-fat foods, including meats and fried foods. Extra added fats including oils, butter, salad dressings, and nuts. Limit these to less than 8 tsp per day.  Fruits and vegetables if they are not tolerated, such as citrus fruits or tomatoes.  Alcohol.  Any food that seems to aggravate your condition. If you have questions regarding your diet, call your caregiver or a registered dietitian. OTHER THINGS THAT MAY HELP GERD INCLUDE:   Eating your meals slowly, in a relaxed setting.  Eating 5 to 6 small meals per day instead of 3 large meals.  Eliminating food for a period of time if it causes distress.  Not lying down until 3 hours after eating a meal.  Keeping the head of your bed raised 6 to 9 inches (15 to 23 cm) by using a foam wedge or blocks under the legs of the bed. Lying flat may make symptoms worse.  Being physically active. Weight loss may be helpful in reducing reflux in overweight or obese adults.  Wear loose fitting clothing EXAMPLE MEAL PLAN This meal plan is approximately 2,000 calories based on https://www.[facility]nard.org/ meal planning guidelines. Breakfast    cup cooked oatmeal.  1 cup strawberries.  1 cup low-fat milk.  1 oz almonds. Snack  1 cup cucumber slices.  6 oz yogurt (made from low-fat or fat-free milk). Lunch  2 slice whole-wheat bread.  2 oz sliced [place].  2 tsp mayonnaise.  1 cup blueberries.  1 cup snap peas. Snack  6 whole-wheat crackers.  1 oz string cheese. Dinner   cup brown rice.  1 cup mixed veggies.  1 tsp olive oil.  3 oz grilled fish. Document Released: 01/21/2005 Document Revised: 04/15/2011 Document Reviewed: 12/07/2010 [facility] Patient Information 2013 [place], [place].

## 2012-02-22 ENCOUNTER — Inpatient Hospital Stay (HOSPITAL_COMMUNITY)
Admission: EM | Admit: 2012-02-22 | Discharge: 2012-03-04 | DRG: 234 | Disposition: A | Discharge status: Home / Self Care | Payer: Medicaid Other | Attending: Thoracic Surgery (Cardiothoracic Vascular Surgery) | Admitting: Thoracic Surgery (Cardiothoracic Vascular Surgery)

## 2012-02-22 ENCOUNTER — Encounter (HOSPITAL_COMMUNITY): Payer: Self-pay | Admitting: *Deleted

## 2012-02-22 ENCOUNTER — Emergency Department (HOSPITAL_COMMUNITY): Payer: Medicaid Other

## 2012-02-22 DIAGNOSIS — I200 Unstable angina: Secondary | ICD-10-CM | POA: Diagnosis present

## 2012-02-22 DIAGNOSIS — D696 Thrombocytopenia, unspecified: Secondary | ICD-10-CM | POA: Diagnosis present

## 2012-02-22 DIAGNOSIS — I119 Hypertensive heart disease without heart failure: Secondary | ICD-10-CM | POA: Diagnosis present

## 2012-02-22 DIAGNOSIS — Z79899 Other long term (current) drug therapy: Secondary | ICD-10-CM

## 2012-02-22 DIAGNOSIS — Z9861 Coronary angioplasty status: Secondary | ICD-10-CM

## 2012-02-22 DIAGNOSIS — E669 Obesity, unspecified: Secondary | ICD-10-CM | POA: Diagnosis present

## 2012-02-22 DIAGNOSIS — Z951 Presence of aortocoronary bypass graft: Secondary | ICD-10-CM

## 2012-02-22 DIAGNOSIS — D62 Acute posthemorrhagic anemia: Secondary | ICD-10-CM | POA: Diagnosis not present

## 2012-02-22 DIAGNOSIS — F3289 Other specified depressive episodes: Secondary | ICD-10-CM | POA: Diagnosis present

## 2012-02-22 DIAGNOSIS — E785 Hyperlipidemia, unspecified: Secondary | ICD-10-CM | POA: Diagnosis present

## 2012-02-22 DIAGNOSIS — Z794 Long term (current) use of insulin: Secondary | ICD-10-CM

## 2012-02-22 DIAGNOSIS — I798 Other disorders of arteries, arterioles and capillaries in diseases classified elsewhere: Secondary | ICD-10-CM | POA: Diagnosis present

## 2012-02-22 DIAGNOSIS — E8779 Other fluid overload: Secondary | ICD-10-CM | POA: Diagnosis not present

## 2012-02-22 DIAGNOSIS — Z7982 Long term (current) use of aspirin: Secondary | ICD-10-CM

## 2012-02-22 DIAGNOSIS — F329 Major depressive disorder, single episode, unspecified: Secondary | ICD-10-CM

## 2012-02-22 DIAGNOSIS — F411 Generalized anxiety disorder: Secondary | ICD-10-CM | POA: Diagnosis present

## 2012-02-22 DIAGNOSIS — I471 Supraventricular tachycardia: Secondary | ICD-10-CM

## 2012-02-22 DIAGNOSIS — I498 Other specified cardiac arrhythmias: Secondary | ICD-10-CM | POA: Diagnosis not present

## 2012-02-22 DIAGNOSIS — E1159 Type 2 diabetes mellitus with other circulatory complications: Secondary | ICD-10-CM | POA: Diagnosis present

## 2012-02-22 DIAGNOSIS — I2510 Atherosclerotic heart disease of native coronary artery without angina pectoris: Principal | ICD-10-CM | POA: Diagnosis present

## 2012-02-22 HISTORY — DX: Atherosclerotic heart disease of native coronary artery without angina pectoris: I25.10

## 2012-02-22 HISTORY — DX: Long term (current) use of insulin: Z79.4

## 2012-02-22 HISTORY — DX: Supraventricular tachycardia: I47.1

## 2012-02-22 HISTORY — DX: Other specified depressive episodes: F32.89

## 2012-02-22 HISTORY — DX: Supraventricular tachycardia, unspecified: I47.10

## 2012-02-22 HISTORY — DX: Type 2 diabetes mellitus with hyperglycemia: E11.65

## 2012-02-22 HISTORY — DX: Hypertensive heart disease without heart failure: I11.9

## 2012-02-22 HISTORY — DX: Reserved for inherently not codable concepts without codable children: IMO0001

## 2012-02-22 HISTORY — DX: Major depressive disorder, single episode, unspecified: F32.9

## 2012-02-22 HISTORY — DX: Hyperlipidemia, unspecified: E78.5

## 2012-02-22 LAB — COMPREHENSIVE METABOLIC PANEL
AST: 25 U/L (ref 0–37)
Albumin: 3.6 g/dL (ref 3.5–5.2)
BUN: 17 mg/dL (ref 6–23)
BUN: 18 mg/dL (ref 6–23)
CO2: 26 mEq/L (ref 19–32)
CO2: 25 mEq/L (ref 19–32)
Calcium: 10 mg/dL (ref 8.4–10.5)
Chloride: 97 mEq/L (ref 96–112)
Chloride: 100 mEq/L (ref 96–112)
Creatinine, Ser: 0.42 mg/dL — ABNORMAL LOW (ref 0.50–1.10)
Creatinine, Ser: 0.4 mg/dL — ABNORMAL LOW (ref 0.50–1.10)
GFR calc Af Amer: >90 mL/min (ref >90)
GFR calc non Af Amer: >90 mL/min (ref >90)
GFR calc non Af Amer: >90 mL/min (ref >90)
Glucose, Bld: 278 mg/dL — ABNORMAL HIGH (ref 70–99)
Total Bilirubin: 0.3 mg/dL (ref 0.3–1.2)
Total Bilirubin: 0.3 mg/dL (ref 0.3–1.2)

## 2012-02-22 LAB — CBC
MCH: 32.6 pg (ref 26.0–34.0)
MCV: 89.6 fL (ref 78.0–100.0)
Platelets: 127 10*3/uL — ABNORMAL LOW (ref 150–400)
RBC: 4.32 MIL/uL (ref 3.87–5.11)
RDW: 13 % (ref 11.5–15.5)

## 2012-02-22 LAB — HEMOGLOBIN A1C
Hgb A1c MFr Bld: 8.5 % — ABNORMAL HIGH (ref <5.7)
Mean Plasma Glucose: 197 mg/dL — ABNORMAL HIGH (ref <117)

## 2012-02-22 LAB — GLUCOSE, CAPILLARY: Glucose-Capillary: 117 mg/dL — ABNORMAL HIGH (ref 70–99)

## 2012-02-22 LAB — TROPONIN I: Troponin I: <0.3 ng/mL (ref <0.30)

## 2012-02-22 LAB — POCT I-STAT TROPONIN I

## 2012-02-22 LAB — PROTIME-INR: Prothrombin Time: 13.1 seconds (ref 11.6–15.2)

## 2012-02-22 LAB — APTT: aPTT: 26 seconds (ref 24–37)

## 2012-02-22 MED ORDER — INSULIN ASPART 100 UNIT/ML ~~LOC~~ SOLN
10.0000 [IU] | Freq: Three times a day (TID) | SUBCUTANEOUS | Status: DC
  Administered 2012-02-22 – 2012-02-25 (×9): 10 [IU] via SUBCUTANEOUS

## 2012-02-22 MED ORDER — SODIUM CHLORIDE 0.9 % IV SOLN
250.0000 mL | INTRAVENOUS | Status: DC | PRN
  Administered 2012-02-22: 250 mL via INTRAVENOUS

## 2012-02-22 MED ORDER — ACETAMINOPHEN 325 MG PO TABS: 650.0000 mg | ORAL_TABLET | ORAL | Status: DC | PRN

## 2012-02-22 MED ORDER — ONDANSETRON HCL 4 MG/2ML IJ SOLN: 4.0000 mg | Freq: Four times a day (QID) | INTRAMUSCULAR | Status: DC | PRN

## 2012-02-22 MED ORDER — INSULIN REGULAR HUMAN 100 UNIT/ML IJ SOLN
10.0000 [IU] | Freq: Three times a day (TID) | INTRAMUSCULAR | Status: DC
  Administered 2012-02-22: 10 [IU] via SUBCUTANEOUS

## 2012-02-22 MED ORDER — SERTRALINE HCL 50 MG PO TABS
50.0000 mg | ORAL_TABLET | Freq: Every day | ORAL | Status: DC
  Administered 2012-02-22 – 2012-02-25 (×4): 50 mg via ORAL
  Filled 2012-02-22 (×5): qty 1

## 2012-02-22 MED ORDER — INSULIN ASPART 100 UNIT/ML ~~LOC~~ SOLN
0.0000 [IU] | Freq: Three times a day (TID) | SUBCUTANEOUS | Status: DC
  Administered 2012-02-23 (×2): 11 [IU] via SUBCUTANEOUS
  Administered 2012-02-23: 3 [IU] via SUBCUTANEOUS
  Administered 2012-02-24: 7 [IU] via SUBCUTANEOUS
  Administered 2012-02-24: 4 [IU] via SUBCUTANEOUS
  Administered 2012-02-25: 7 [IU] via SUBCUTANEOUS
  Administered 2012-02-25 (×2): 4 [IU] via SUBCUTANEOUS

## 2012-02-22 MED ORDER — SIMVASTATIN 40 MG PO TABS
40.0000 mg | ORAL_TABLET | Freq: Every day | ORAL | Status: DC
  Administered 2012-02-22 – 2012-02-25 (×4): 40 mg via ORAL
  Filled 2012-02-22 (×5): qty 1

## 2012-02-22 MED ORDER — SODIUM CHLORIDE 0.9 % IJ SOLN: 3.0000 mL | INTRAMUSCULAR | Status: DC | PRN

## 2012-02-22 MED ORDER — INSULIN DETEMIR 100 UNIT/ML ~~LOC~~ SOLN
100.0000 [IU] | Freq: Every day | SUBCUTANEOUS | Status: DC
  Administered 2012-02-22 – 2012-02-25 (×4): 100 [IU] via SUBCUTANEOUS
  Filled 2012-02-22: qty 10

## 2012-02-22 MED ORDER — ASPIRIN EC 81 MG PO TBEC
81.0000 mg | DELAYED_RELEASE_TABLET | Freq: Every day | ORAL | Status: DC
  Administered 2012-02-23 – 2012-02-25 (×2): 81 mg via ORAL
  Filled 2012-02-22 (×4): qty 1

## 2012-02-22 MED ORDER — NITROGLYCERIN 0.4 MG SL SUBL: 0.4000 mg | SUBLINGUAL_TABLET | SUBLINGUAL | Status: DC | PRN

## 2012-02-22 MED ORDER — HEPARIN BOLUS VIA INFUSION
4000.0000 [IU] | Freq: Once | INTRAVENOUS | Status: AC
  Administered 2012-02-22: 4000 [IU] via INTRAVENOUS

## 2012-02-22 MED ORDER — METFORMIN HCL 500 MG PO TABS
1000.0000 mg | ORAL_TABLET | Freq: Two times a day (BID) | ORAL | Status: DC
  Administered 2012-02-22 – 2012-02-23 (×3): 1000 mg via ORAL
  Filled 2012-02-22 (×6): qty 2

## 2012-02-22 MED ORDER — SODIUM CHLORIDE 0.9 % IJ SOLN
3.0000 mL | Freq: Two times a day (BID) | INTRAMUSCULAR | Status: DC
  Administered 2012-02-22 – 2012-02-25 (×6): 3 mL via INTRAVENOUS

## 2012-02-22 MED ORDER — HEPARIN (PORCINE) IN NACL 100-0.45 UNIT/ML-% IJ SOLN
1250.0000 [IU]/h | INTRAMUSCULAR | Status: DC
  Administered 2012-02-22: 800 [IU]/h via INTRAVENOUS
  Administered 2012-02-23: 1250 [IU]/h via INTRAVENOUS
  Administered 2012-02-23: 1150 [IU]/h via INTRAVENOUS
  Administered 2012-02-24: 1250 [IU]/h via INTRAVENOUS
  Filled 2012-02-22 (×4): qty 250

## 2012-02-22 MED ORDER — HEPARIN BOLUS VIA INFUSION
1000.0000 [IU] | Freq: Once | INTRAVENOUS | Status: AC
  Administered 2012-02-22: 1000 [IU] via INTRAVENOUS
  Filled 2012-02-22: qty 1000

## 2012-02-22 MED ORDER — HYDROCHLOROTHIAZIDE 12.5 MG PO CAPS
12.5000 mg | ORAL_CAPSULE | Freq: Every day | ORAL | Status: DC
  Administered 2012-02-23 – 2012-02-25 (×2): 12.5 mg via ORAL
  Filled 2012-02-22 (×4): qty 1

## 2012-02-22 MED ORDER — QUINAPRIL HCL 10 MG PO TABS
40.0000 mg | ORAL_TABLET | Freq: Every day | ORAL | Status: DC
  Administered 2012-02-22 – 2012-02-25 (×4): 40 mg via ORAL
  Filled 2012-02-22 (×5): qty 4

## 2012-02-22 MED ORDER — METOPROLOL TARTRATE 50 MG PO TABS
50.0000 mg | ORAL_TABLET | Freq: Two times a day (BID) | ORAL | Status: DC
  Administered 2012-02-22 – 2012-02-25 (×7): 50 mg via ORAL
  Filled 2012-02-22 (×9): qty 1

## 2012-02-22 NOTE — Consult Note (Signed)
ANTICOAGULATION CONSULT NOTE - Initial Consult  Pharmacy Consult for Heparin Indication: chest pain/ACS  No Known Allergies  Patient Measurements: Height: 5\' 2"  (157.5 cm) Weight: 159 lb (72.122 kg) IBW/kg (Calculated) : 50.1  Heparin Dosing Weight: 65kg  Vital Signs: Temp: 98.5 F (36.9 C) (01/18 1441) Temp src: Oral (01/18 1441) BP: 102/77 mmHg (01/18 1441) Pulse Rate: 77  (01/18 1441)  Labs:  Basename 02/22/12 1156  HGB 14.1  HCT 38.7  PLT 127*  APTT --  LABPROT --  INR --  HEPARINUNFRC --  CREATININE 0.42*  CKTOTAL --  CKMB --  TROPONINI --    Estimated Creatinine Clearance: 67.8 ml/min (by C-G formula based on Cr of 0.42).   Medical History: Past Medical History  Diagnosis Date  . CAD     Coronary disease status post non-ST elevation MI in May 2010 with  PCI of the left circumflex on Jun 24, 2008. She then had a PCI of  the RCA on July 15, 2008.    . Diabetes mellitus, insulin dependent (IDDM), uncontrolled   . Hyperlipidemia   . Hypertensive heart disease   .  Paroxysmal SVT (ANVRT)     S/p AV nodal ablation Dr. Ladona Ridgel 09/28/08   . Depression     Medications:  No anticoagulants pta  Assessment: 61yof with hx CAD s/p 2 stents in 2010 to begin IV heparin for unstable angina. Baseline CBC and renal function wnl. Plan for cardiac cath during this admission.  Goal of Therapy:  Heparin level 0.3-0.7 units/ml Monitor platelets by anticoagulation protocol: Yes   Plan:  1) Heparin bolus 4000 units x 1 2) Heparin drip @ 800 units/hr 3) 6 hour heparin level 4) Daily heparin level and CBC  Fredrik Rigger 02/22/2012,2:47 PM

## 2012-02-22 NOTE — ED Provider Notes (Signed)
History     CSN: [id number]  Arrival date & time 02/22/12  1140   First MD Initiated Contact with Patient 02/22/12 1244      Chief Complaint  Patient presents with  . Chest Pain    (Consider location/radiation/quality/duration/timing/severity/associated sxs/prior treatment) The history is provided by the patient.   patient here complaining of chest pain midsternal similar to her anginal equivalent is worse with exertion and better with nitroglycerin. She has not had any associated dyspnea or diaphoresis. A recent fever or cough. She is also insurance and has not been compliant with her antiplatelet therapy. She has not seen a cardiologist in 3 years. She came in today because her chest pressure gets worse with any type of activity.  Past Medical History  Diagnosis Date  . Diabetes mellitus   . Hypertension   . Coronary artery disease     3 stent placements    Past Surgical History  Procedure Date  . Coronary angioplasty with stent placement     No family history on file.  History  Substance Use Topics  . Smoking status: Never Smoker   . Smokeless tobacco: Not on file  . Alcohol Use: No    OB History    Grav Para Term Preterm Abortions TAB SAB Ect Mult Living                  Review of Systems  All other systems reviewed and are negative.    Allergies  Review of patient's allergies indicates no known allergies.  Home Medications   Current Outpatient Rx  Name  Route  Sig  Dispense  Refill  . ASPIRIN 325 MG PO TABS   Oral   Take 325 mg by mouth daily.         Marland Kitchen CLOPIDOGREL BISULFATE 75 MG PO TABS   Oral   Take 75 mg by mouth daily.           . OMEGA-3 FATTY ACIDS 1000 MG PO CAPS   Oral   Take 2 g by mouth 2 (two) times daily.           Marland Kitchen HYDROCHLOROTHIAZIDE 12.5 MG PO CAPS   Oral   Take 12.5 mg by mouth daily.           . INSULIN DETEMIR 100 UNIT/ML [place] SOLN   Subcutaneous   Inject 100 Units into the skin at bedtime.           .  INSULIN REGULAR HUMAN 100 UNIT/ML IJ SOLN   Subcutaneous   Inject into the skin 2 (two) times daily with a meal. 8 units         . METFORMIN HCL 1000 MG PO TABS   Oral   Take 1,000 mg by mouth 2 (two) times daily with a meal.         . METOPROLOL TARTRATE 50 MG PO TABS   Oral   Take 50 mg by mouth. On tab in am         . METOPROLOL TARTRATE 25 MG PO TABS   Oral   Take 25 mg by mouth. 1 tab in the PM         . QUINAPRIL HCL 20 MG PO TABS   Oral   Take 20 mg by mouth at bedtime.           . SERTRALINE HCL 50 MG PO TABS   Oral   Take 50 mg by mouth daily.           Marland Kitchen  SIMVASTATIN 40 MG PO TABS   Oral   Take 40 mg by mouth at bedtime.             BP 141/65  Pulse 84  Temp 98.1 F (36.7 C) (Oral)  Resp 18  Ht 5\' 2"  (1.575 m)  Wt 159 lb (72.122 kg)  BMI 29.08 kg/m2  SpO2 98%  Physical Exam  Nursing note and vitals reviewed. Constitutional: She is oriented to person, place, and time. She appears well-developed and well-nourished.  Non-toxic appearance. No distress.  HENT:  Head: Normocephalic and atraumatic.  Eyes: Conjunctivae normal, EOM and lids are normal. Pupils are equal, round, and reactive to light.  Neck: Normal range of motion. Neck supple. No tracheal deviation present. No mass present.  Cardiovascular: Normal rate, regular rhythm and normal heart sounds.  Exam reveals no gallop.   No murmur heard. Pulmonary/Chest: Effort normal and breath sounds normal. No stridor. No respiratory distress. She has no decreased breath sounds. She has no wheezes. She has no rhonchi. She has no rales.  Abdominal: Soft. Normal appearance and bowel sounds are normal. She exhibits no distension. There is no tenderness. There is no rebound and no CVA tenderness.  Musculoskeletal: Normal range of motion. She exhibits no edema and no tenderness.  Neurological: She is alert and oriented to person, place, and time. She has normal strength. No cranial nerve deficit or sensory  deficit. GCS eye subscore is 4. GCS verbal subscore is 5. GCS motor subscore is 6.  Skin: Skin is warm and dry. No abrasion and no rash noted.  Psychiatric: She has a normal mood and affect. Her speech is normal and behavior is normal.    ED Course  Procedures (including critical care time)  Labs Reviewed  CBC - Abnormal; Notable for the following:    MCHC 36.4 (*)     Platelets 127 (*)     All other components within normal limits  POCT I-STAT TROPONIN I  COMPREHENSIVE METABOLIC PANEL   No results found.   No diagnosis found.    MDM   Date: 02/22/2012  Rate: 85   Rhythm: normal sinus rhythm  QRS Axis: left  Intervals: normal  ST/T Wave abnormalities: normal  Conduction Disutrbances:none  Narrative Interpretation:   Old EKG Reviewed: none available   Patient with symptoms of chest pain concerning for unstable angina and she will be admitted to the hospital       Toy Baker, MD 02/22/12 1252

## 2012-02-22 NOTE — H&P (Signed)
History and Physical   Admit date: 02/22/2012 Name:  [name] Medical record number: [id number] DOB/Age:  1950-10-14  62 y.o. female  Referring Physician:  [facility]  Primary Physician:  Dr. Evelena Peat Chief complaint/reason for admission:   Chest pain  HPI:  This 62 year old female has a prior history of diabetes hypertension obesity and hyperlipidemia. She also has had significant depression. She has a history of AV nodal reentry tachycardia and had an ablation in 2010. In May of 2010 she had a stent placed to the circumflex in in June a stent placed the right coronary artery by Dr. Ty Hilts. Since the patient is held as were done she is really not had Cardiologic followup due to finances and has been seen mostly at the health Department and [facility] [place]. She began to have chest discomfort in December of this year described as heaviness with exertion such as taking the trash down. The discomfort became increasingly severe with activity and she had an episode of chest pain occurring at rest this morning relieved with nitroglycerin. She then came to the emergency room and even while walking from the parking lot to the emergency room had recurrent chest discomfort. She is currently admitted for treatment of unstable angina pectoris. She is obese and is relatively inactive. She denies PND, orthopnea or edema. Her diabetes is poorly controlled.    Past Medical History  Diagnosis Date  . CAD     Coronary disease status post non-ST elevation MI in May 2010 with  PCI of the left circumflex on Jun 24, 2008. She then had a PCI of  the RCA on July 15, 2008.    . Diabetes mellitus, insulin dependent (IDDM), uncontrolled   . Hyperlipidemia   . Hypertensive heart disease   .  Paroxysmal SVT (ANVRT)     S/p AV nodal ablation Dr. Ladona Ridgel 09/28/08   . Depression      Past Surgical History  Procedure Date  . Coronary angioplasty with stent placement    Allergies:  has no  known allergies.   Medications: Prior to Admission medications   Medication Sig Start Date End Date Taking? Authorizing Provider  aspirin 325 MG tablet Take 325 mg by mouth at bedtime.    Yes Historical Provider, MD  Coral Calcium 1000 (390 CA) MG TABS Take 1,000 mg by mouth 2 (two) times daily.   Yes Historical Provider, MD  fish oil-omega-3 fatty acids 1000 MG capsule Take 2 g by mouth 2 (two) times daily.    Yes Historical Provider, MD  hydrochlorothiazide (MICROZIDE) 12.5 MG capsule Take 12.5 mg by mouth daily.     Yes Historical Provider, MD  insulin detemir (LEVEMIR) 100 UNIT/ML injection Inject 100 Units into the skin at bedtime.    Yes Historical Provider, MD  insulin regular (NOVOLIN R,HUMULIN R) 100 units/mL injection Inject 10 Units into the skin 3 (three) times daily. 30 minutes after each meal   Yes Historical Provider, MD  metFORMIN (GLUCOPHAGE) 1000 MG tablet Take 1,000 mg by mouth 2 (two) times daily with a meal.   Yes Historical Provider, MD  PRESCRIPTION MEDICATION Take 50 mg by mouth daily. Metoprolol but patient does not know if it is tartrate or succinate - filled at [facility]   Yes Historical Provider, MD  PRESCRIPTION MEDICATION Take 25 mg by mouth every evening. Metoprolol but patient does not know if it is tartrate or succinate - filled at [facility] Dept  Yes Historical Provider, MD  quinapril (ACCUPRIL) 40 MG tablet Take 40 mg by mouth at bedtime.   Yes Historical Provider, MD  sertraline (ZOLOFT) 50 MG tablet Take 50 mg by mouth at bedtime.    Yes Historical Provider, MD  simvastatin (ZOCOR) 40 MG tablet Take 40 mg by mouth at bedtime.    Yes Historical Provider, MD    Family History:  Family Status  Relation Status Death Age  . Father Deceased 73    died of CHF and COPD  . Mother      schizophrenia    Social History:   reports that she has never smoked. She has never used smokeless tobacco. She reports that she does not  drink alcohol or use illicit drugs.   History   Social History Narrative  . No narrative on file     Review of Systems: She is been obese for many years. May have some mild numbness in her feet. She is sedentary and inactive. She has intermittent reflux and has been treated in the past with Zantac without recurrent problems. Other than as noted above, the remainder of the review of systems is normal  Physical Exam: BP 133/52  Pulse 82  Temp 98.1 F (36.7 C) (Oral)  Resp 18  Ht 5\' 2"  (1.575 m)  Wt 72.122 kg (159 lb)  BMI 29.08 kg/m2  SpO2 94% General appearance: alert, cooperative, appears stated age, no distress and moderately obese Head: Normocephalic, without obvious abnormality, atraumatic Eyes: conjunctivae/corneas clear. PERRL, EOM's intact. Fundi not examined  Neck: no adenopathy, no carotid bruit, no JVD and supple, symmetrical, trachea midline Lungs: clear to auscultation bilaterally Heart: regular rate and rhythm, S1, S2 normal, no murmur, click, rub or gallop Abdomen: soft, non-tender; bowel sounds normal; no masses,  no organomegaly Pelvic: deferred Extremities: extremities normal, atraumatic, no cyanosis or edema Pulses: 2+ and symmetric Skin: Skin color, texture, turgor normal. No rashes or lesions Neurologic: Grossly normal  Labs: CBC  Basename 02/22/12 1156  WBC 6.4  RBC 4.32  HGB 14.1  HCT 38.7  PLT 127*  MCV 89.6  MCH 32.6  MCHC 36.4*  RDW 13.0  LYMPHSABS --  MONOABS --  EOSABS --  BASOSABS --   CMP   Basename 02/22/12 1156  NA 139  K 3.9  CL 97  CO2 26  GLUCOSE 278*  BUN 17  CREATININE 0.42*  CALCIUM 9.9  PROT 6.9  ALBUMIN 3.7  AST 25  ALT 29  ALKPHOS 40  BILITOT 0.3  GFRNONAA >90  GFRAA >90    EKG: Sinus rhythm, left axis deviation, isolated T wave inversion in V2  Radiology: Low lung volumes   IMPRESSIONS: 1. Unstable angina pectoris 2. Coronary artery disease with previous right coronary and left coronary  circumflex stent 3. Insulin-dependent diabetes mellitus poorly controlled 4. Hypertensive heart disease 5. Hyperlipidemia 6. Depression 7. Obesity  PLAN: Patient has unstable angina and will be placed on intravenous heparin, serial cardiac enzymes, because of the episode of pain occurring at rest and maximum medical therapy she will need to have catheterization.  Signed: Darden Palmer MD [facility] Cardiology  02/22/2012, 2:36 PM

## 2012-02-22 NOTE — Consult Note (Signed)
ANTICOAGULATION CONSULT NOTE - Follow Up  Pharmacy Consult for Heparin Indication: chest pain/ACS  No Known Allergies  Patient Measurements: Height: 5\' 2"  (157.5 cm) Weight: 159 lb (72.122 kg) IBW/kg (Calculated) : 50.1  Heparin Dosing Weight: 65kg  Vital Signs: Temp: 98.3 F (36.8 C) (01/18 2100) Temp src: Oral (01/18 1749) BP: 129/69 mmHg (01/18 2100) Pulse Rate: 75  (01/18 2100)  Labs:  Basename 02/22/12 2144 02/22/12 2024 02/22/12 1441 02/22/12 1440 02/22/12 1156  HGB -- -- -- -- 14.1  HCT -- -- -- -- 38.7  PLT -- -- -- -- 127*  APTT -- -- -- 26 --  LABPROT -- -- -- 13.1 --  INR -- -- -- 1.00 --  HEPARINUNFRC 0.13* -- -- -- --  CREATININE -- -- -- 0.40* 0.42*  CKTOTAL -- -- -- -- --  CKMB -- -- -- -- --  TROPONINI -- <0.30 <0.30 -- --    Estimated Creatinine Clearance: 67.8 ml/min (by C-G formula based on Cr of 0.4).   Medical History: Past Medical History  Diagnosis Date  . CAD     Coronary disease status post non-ST elevation MI in May 2010 with  PCI of the left circumflex on Jun 24, 2008. She then had a PCI of  the RCA on July 15, 2008.    . Diabetes mellitus, insulin dependent (IDDM), uncontrolled   . Hyperlipidemia   . Hypertensive heart disease   .  Paroxysmal SVT (ANVRT)     S/p AV nodal ablation Dr. Ladona Ridgel 09/28/08   . Depression     Medications:  No anticoagulants pta  Assessment: 61yof with hx CAD s/p 2 stents in 2010 to begin IV heparin for unstable angina. Baseline CBC and renal function wnl. Plan for cardiac cath during this admission. Initial heparin level subtherapeutic.  Goal of Therapy:  Heparin level 0.3-0.7 units/ml Monitor platelets by anticoagulation protocol: Yes   Plan:  1) Heparin bolus 1000 units x 1 2) Increase heparin drip to 1000  units/hr 3) 8 hour heparin level   Ski Polich Poteet 02/22/2012,10:29 PM

## 2012-02-22 NOTE — ED Notes (Signed)
Pt c/o acute onset chest pressure, generalized.  Stent placements in 2010 by Dr Corinna Lines as well as ablation for "racing heart".  Pt took 1 SL nitro with some relief.  Pt states she began feeling chest discomfort in Dec, off-and-on.

## 2012-02-23 ENCOUNTER — Encounter (HOSPITAL_COMMUNITY): Payer: Self-pay | Admitting: *Deleted

## 2012-02-23 LAB — CBC
MCH: 32.5 pg (ref 26.0–34.0)
MCHC: 35.6 g/dL (ref 30.0–36.0)
MCV: 91.2 fL (ref 78.0–100.0)
Platelets: 89 10*3/uL — ABNORMAL LOW (ref 150–400)
RDW: 13.3 % (ref 11.5–15.5)

## 2012-02-23 LAB — LIPID PANEL
Cholesterol: 128 mg/dL (ref 0–200)
HDL: 22 mg/dL — ABNORMAL LOW (ref >39)
Total CHOL/HDL Ratio: 5.8 RATIO
VLDL: UNDETERMINED mg/dL (ref 0–40)

## 2012-02-23 LAB — TSH: TSH: 0.588 u[IU]/mL (ref 0.350–4.500)

## 2012-02-23 LAB — GLUCOSE, CAPILLARY
Glucose-Capillary: 268 mg/dL — ABNORMAL HIGH (ref 70–99)
Glucose-Capillary: 125 mg/dL — ABNORMAL HIGH (ref 70–99)
Glucose-Capillary: 268 mg/dL — ABNORMAL HIGH (ref 70–99)

## 2012-02-23 LAB — HEPARIN LEVEL (UNFRACTIONATED): Heparin Unfractionated: 0.29 IU/mL — ABNORMAL LOW (ref 0.30–0.70)

## 2012-02-23 MED ORDER — DIAZEPAM 5 MG PO TABS
10.0000 mg | ORAL_TABLET | ORAL | Status: AC
  Administered 2012-02-24: 10 mg via ORAL
  Filled 2012-02-23: qty 2

## 2012-02-23 MED ORDER — HEPARIN BOLUS VIA INFUSION
1000.0000 [IU] | Freq: Once | INTRAVENOUS | Status: AC
  Administered 2012-02-23: 1000 [IU] via INTRAVENOUS
  Filled 2012-02-23: qty 1000

## 2012-02-23 MED ORDER — SODIUM CHLORIDE 0.9 % IV SOLN
1.0000 mL/kg/h | INTRAVENOUS | Status: DC
  Administered 2012-02-24: 1 mL/kg/h via INTRAVENOUS

## 2012-02-23 MED ORDER — ZOLPIDEM TARTRATE 5 MG PO TABS
5.0000 mg | ORAL_TABLET | Freq: Every evening | ORAL | Status: DC | PRN
  Administered 2012-02-23 – 2012-02-25 (×4): 5 mg via ORAL
  Filled 2012-02-23 (×5): qty 1

## 2012-02-23 NOTE — Progress Notes (Signed)
Subjective:  No chest pain overnight. EKG shows minor T wave changes in V2 and V3, troponins are negative.  Objective:  Vital Signs in the last 24 hours: BP 105/64  Pulse 72  Temp 98.3 F (36.8 C) (Oral)  Resp 16  Ht 5' 2" (1.575 m)  Wt 69.219 kg (152 lb 9.6 oz)  BMI 27.91 kg/m2  SpO2 96%  Physical Exam: Obese white female in no acute distress Lungs:  Clear  Cardiac:  Regular rhythm, normal S1 and S2, no S3 Extremities:  No edema present  Intake/Output from previous day: 01/18 0701 - 01/19 0700 In: 332.9 [P.O.:240; I.V.:92.9] Out: -   Weight Filed Weights   02/22/12 1154 02/22/12 1457 02/23/12 0500  Weight: 72.122 kg (159 lb) 72.122 kg (159 lb) 69.219 kg (152 lb 9.6 oz)    Lab Results: Basic Metabolic Panel:  Basename 02/22/12 1440 02/22/12 1156  NA 138 139  K 3.9 3.9  CL 100 97  CO2 25 26  GLUCOSE 178* 278*  BUN 18 17  CREATININE 0.40* 0.42*   CBC:  Basename 02/23/12 0303 02/22/12 1156  WBC 5.7 6.4  NEUTROABS -- --  HGB 11.4* 14.1  HCT 32.0* 38.7  MCV 91.2 89.6  PLT 89* 127*   Cardiac Enzymes:  Basename 02/23/12 0258 02/22/12 2024 02/22/12 1441  CKTOTAL -- -- --  CKMB -- -- --  CKMBINDEX -- -- --  TROPONINI <0.30 <0.30 <0.30    Telemetry: Sinus rhythm  Assessment/Plan: 1. Unstable angina pectoris 2. Type 2 diabetes 3. Hypertension under control 4. Hyperlipidemia 5. Obesity 6. Anxiety and depression  Recommendations:  Plan cardiac catheterization because of unstable angina and rest chest pain with known stents.Cardiac catheterization was discussed with the patient fully including risks of myocardial infarction, death, stroke, bleeding, arrhythmia, dye allergy, renal insufficiency or bleeding.  The patient understands and is willing to proceed. Possibility of stenting also discussed with patient to be done by an interventional cardiologist      W. Spencer Damond Borchers, Jr.  MD F[facility] Cardiology  02/23/2012, 9:16 AM    

## 2012-02-23 NOTE — Progress Notes (Addendum)
ANTICOAGULATION CONSULT NOTE - Follow Up Consult  Pharmacy Consult for Heparin Indication: chest pain/ACS  No Known Allergies  Patient Measurements: Height: 5\' 2"  (157.5 cm) Weight: 152 lb 9.6 oz (69.219 kg) IBW/kg (Calculated) : 50.1  Heparin Dosing Weight: 64.6kg  Vital Signs: Temp: 98.3 F (36.8 C) (01/19 0500) BP: 105/64 mmHg (01/19 0500) Pulse Rate: 72  (01/19 0500)  Labs:  Basename 02/23/12 0615 02/23/12 0303 02/23/12 0258 02/22/12 2144 02/22/12 2024 02/22/12 1441 02/22/12 1440 02/22/12 1156  HGB -- 11.4* -- -- -- -- -- 14.1  HCT -- 32.0* -- -- -- -- -- 38.7  PLT -- 89* -- -- -- -- -- 127*  APTT -- -- -- -- -- -- 26 --  LABPROT -- -- -- -- -- -- 13.1 --  INR -- -- -- -- -- -- 1.00 --  HEPARINUNFRC 0.22* -- -- 0.13* -- -- -- --  CREATININE -- -- -- -- -- -- 0.40* 0.42*  CKTOTAL -- -- -- -- -- -- -- --  CKMB -- -- -- -- -- -- -- --  TROPONINI -- -- <0.30 -- <0.30 <0.30 -- --    Estimated Creatinine Clearance: 66.4 ml/min (by C-G formula based on Cr of 0.4).   Medications:  Heparin 1000 units/hr  Assessment: 61yof continuing on heparin for CP/ACS. Heparin level (0.22) is subtherapeutic but trended up after rate increase. - H/H and Plts decreased - monitor - No signficant bleeding reported and no problems with infusion/line per RN  Goal of Therapy:  Heparin level 0.3-0.7 units/ml Monitor platelets by anticoagulation protocol: Yes   Plan:  1. Heparin bolus IV 1000 units x 1 2. Increase heparin drip to 1150 units/hr (11.5 ml/hr) 3. Check heparin level 6 hours after rate increase  Cleon Dew [phone] 02/23/2012,7:24 AM    Addendum: Heparin level (0.29) is just below goal level after rate increase but level was drawn ~1.5hrs early. Will increase rate some and follow-up AM heparin level. No bleeding reported.  Goal heparin level: 0.3-0.7  Plan: 1. Increase heparin drip to 1250 units/hr (12.5 ml/hr) 2. Follow-up AM heparin level and  CBC  Wilfred Lacy, PharmD Clinical Pharmacist [phone] 02/23/2012, 2:39 PM

## 2012-02-24 ENCOUNTER — Encounter (HOSPITAL_COMMUNITY)
Admission: EM | Disposition: A | Discharge status: Home / Self Care | Payer: Self-pay | Source: Home / Self Care | Attending: Thoracic Surgery (Cardiothoracic Vascular Surgery)

## 2012-02-24 ENCOUNTER — Other Ambulatory Visit: Payer: Self-pay | Admitting: *Deleted

## 2012-02-24 DIAGNOSIS — I2510 Atherosclerotic heart disease of native coronary artery without angina pectoris: Secondary | ICD-10-CM

## 2012-02-24 DIAGNOSIS — D696 Thrombocytopenia, unspecified: Secondary | ICD-10-CM | POA: Diagnosis present

## 2012-02-24 DIAGNOSIS — R079 Chest pain, unspecified: Secondary | ICD-10-CM

## 2012-02-24 HISTORY — PX: LEFT HEART CATHETERIZATION WITH CORONARY ANGIOGRAM: SHX5451

## 2012-02-24 LAB — CBC
MCHC: 36.9 g/dL — ABNORMAL HIGH (ref 30.0–36.0)
Platelets: 111 10*3/uL — ABNORMAL LOW (ref 150–400)
RDW: 13 % (ref 11.5–15.5)

## 2012-02-24 LAB — URINALYSIS, ROUTINE W REFLEX MICROSCOPIC
Bilirubin Urine: NEGATIVE
Nitrite: NEGATIVE
Specific Gravity, Urine: 1.038 — ABNORMAL HIGH (ref 1.005–1.030)
pH: 5 (ref 5.0–8.0)

## 2012-02-24 LAB — GLUCOSE, CAPILLARY
Glucose-Capillary: 168 mg/dL — ABNORMAL HIGH (ref 70–99)
Glucose-Capillary: 179 mg/dL — ABNORMAL HIGH (ref 70–99)
Glucose-Capillary: 244 mg/dL — ABNORMAL HIGH (ref 70–99)

## 2012-02-24 LAB — SURGICAL PCR SCREEN: Staphylococcus aureus: POSITIVE — AB

## 2012-02-24 LAB — URINE MICROSCOPIC-ADD ON

## 2012-02-24 LAB — POCT ACTIVATED CLOTTING TIME: Activated Clotting Time: 192 seconds

## 2012-02-24 LAB — HEPARIN LEVEL (UNFRACTIONATED): Heparin Unfractionated: 0.34 IU/mL (ref 0.30–0.70)

## 2012-02-24 SURGERY — LEFT HEART CATHETERIZATION WITH CORONARY ANGIOGRAM
Anesthesia: LOCAL

## 2012-02-24 MED ORDER — DIAZEPAM 5 MG PO TABS
5.0000 mg | ORAL_TABLET | Freq: Once | ORAL | Status: AC
  Administered 2012-02-26: 5 mg via ORAL
  Filled 2012-02-24: qty 1

## 2012-02-24 MED ORDER — LIDOCAINE HCL (PF) 1 % IJ SOLN
INTRAMUSCULAR | Status: AC
  Filled 2012-02-24: qty 30

## 2012-02-24 MED ORDER — CHLORHEXIDINE GLUCONATE 4 % EX LIQD
60.0000 mL | Freq: Once | CUTANEOUS | Status: AC
  Administered 2012-02-26: 4 via TOPICAL
  Filled 2012-02-24 (×2): qty 60

## 2012-02-24 MED ORDER — ACETAMINOPHEN 325 MG PO TABS: 650.0000 mg | ORAL_TABLET | ORAL | Status: DC | PRN

## 2012-02-24 MED ORDER — ALPRAZOLAM 0.25 MG PO TABS: 0.2500 mg | ORAL_TABLET | ORAL | Status: DC | PRN

## 2012-02-24 MED ORDER — TEMAZEPAM 15 MG PO CAPS: 15.0000 mg | ORAL_CAPSULE | Freq: Once | ORAL | Status: AC | PRN

## 2012-02-24 MED ORDER — HEPARIN (PORCINE) IN NACL 2-0.9 UNIT/ML-% IJ SOLN
INTRAMUSCULAR | Status: AC
  Filled 2012-02-24: qty 1000

## 2012-02-24 MED ORDER — SODIUM CHLORIDE 0.9 % IV SOLN
1.0000 mL/kg/h | INTRAVENOUS | Status: DC
  Administered 2012-02-24: 1 mL/kg/h via INTRAVENOUS
  Administered 2012-02-26: 0.143 mL/kg/h via INTRAVENOUS

## 2012-02-24 MED ORDER — METOPROLOL TARTRATE 12.5 MG HALF TABLET
12.5000 mg | ORAL_TABLET | Freq: Once | ORAL | Status: AC
  Administered 2012-02-26: 12.5 mg via ORAL
  Filled 2012-02-24: qty 1

## 2012-02-24 MED ORDER — HEPARIN (PORCINE) IN NACL 100-0.45 UNIT/ML-% IJ SOLN
1250.0000 [IU]/h | INTRAMUSCULAR | Status: DC
  Administered 2012-02-24 – 2012-02-26 (×3): 1250 [IU]/h via INTRAVENOUS
  Filled 2012-02-24 (×2): qty 250

## 2012-02-24 MED ORDER — NITROGLYCERIN 0.2 MG/ML ON CALL CATH LAB
INTRAVENOUS | Status: AC
  Filled 2012-02-24: qty 1

## 2012-02-24 MED ORDER — BISACODYL 5 MG PO TBEC
5.0000 mg | DELAYED_RELEASE_TABLET | Freq: Once | ORAL | Status: DC
  Filled 2012-02-24 (×2): qty 1

## 2012-02-24 NOTE — Interval H&P Note (Signed)
History and Physical Interval Note:    Patient seen and examined.  No interval change in history and exam since last note.  Stable for procedure.  [name]. MD [facility]  02/24/2012      02/24/2012 7:51 AM

## 2012-02-24 NOTE — Progress Notes (Signed)
ANTICOAGULATION CONSULT NOTE - Follow Up Consult  Pharmacy Consult for Heparin Indication: chest pain/ACS  No Known Allergies  Patient Measurements: Height: 5\' 2"  (157.5 cm) Weight: 154 lb 4.8 oz (69.99 kg) IBW/kg (Calculated) : 50.1  Heparin Dosing Weight: 64.6kg  Vital Signs: Temp: 98 F (36.7 C) (01/20 0500) BP: 112/67 mmHg (01/20 0958) Pulse Rate: 78  (01/20 0958)  Labs:  Basename 02/24/12 0550 02/23/12 1243 02/23/12 0615 02/23/12 0303 02/23/12 0258 02/22/12 2024 02/22/12 1441 02/22/12 1440 02/22/12 1156  HGB 13.4 -- -- 11.4* -- -- -- -- --  HCT 36.3 -- -- 32.0* -- -- -- -- 38.7  PLT 111* -- -- 89* -- -- -- -- 127*  APTT -- -- -- -- -- -- -- 26 --  LABPROT -- -- -- -- -- -- -- 13.1 --  INR -- -- -- -- -- -- -- 1.00 --  HEPARINUNFRC 0.34 0.29* 0.22* -- -- -- -- -- --  CREATININE -- -- -- -- -- -- -- 0.40* 0.42*  CKTOTAL -- -- -- -- -- -- -- -- --  CKMB -- -- -- -- -- -- -- -- --  TROPONINI -- -- -- -- <0.30 <0.30 <0.30 -- --    Estimated Creatinine Clearance: 66.9 ml/min (by C-G formula based on Cr of 0.4).  Assessment: 89 yof now s/p cath which demonstrated significant 3V CAD recommended for CABG. Heparin is to restart 8 hours post-sheath removal. Sheath was removed ~9am today. Pt remains anemic and thrombocytopenic but this is relatively stable. No overt bleeding noted. Pt has been therapeutic on heparin 1250 units/hr.   Goal of Therapy:  Heparin level 0.3-0.7 units/ml Monitor platelets by anticoagulation protocol: Yes   Plan:  1. Restart heparin at 1700 tonight at 1250 units/hr 2. Check an 8 hour heparin level 3. Daily heparin level and CBC 4. F/u CABG plans  Lysle Pearl, PharmD, BCPS Pager # [phone] 02/24/2012 10:01 AM

## 2012-02-24 NOTE — CV Procedure (Signed)
Cardiac Catheterization Report   [name]    62 y.o.  female  DOB: 1950-08-23  MRN: [id number]  02/24/2012   PROCEDURE:  Left heart catheterization with selective coronary angiography, left ventriculogram.  INDICATIONS:  Unstable angina pectoris in a patient with previous coronary artery disease and stenting The risks, benefits, and details of the procedure were explained to the patient.  The patient verbalized understanding and wanted to proceed.  Informed written consent was obtained.  PROCEDURE TECHNIQUE:  After Xylocaine anesthesia a 50F sheath was placed in the right femoral artery with a single anterior needle wall stick.   Left coronary angiography was done using a Judkins L4 guide catheter.  Right coronary angiography was done using a Judkins R4 guide catheter.  A 30 cc ventriculogram was performed in the 30 degree RAO projection.  Tolerated the procedure well.  Sheath removed in the holding area.   CONTRAST:  Total of  70 cc.  COMPLICATIONS:  None.    HEMODYNAMICS:  Aortic postcontrast 118/57, LV postcontrast 118/7.  There was no gradient between the left ventricle and aorta.    ANGIOGRAPHIC DATA:    CORONARY ARTERIES:   Arise and distribute normally. Right dominant. Dense coronary calcification is noted practically in the left coronary system.  Left main coronary artery: Mild distal narrowing.  Left anterior descending: Severely calcified proximally, eccentric 80% proximal stenosis followed by a severe 95% proximal stenosis and segmental disease. Distal vessel has mild distal disease..  Circumflex coronary artery: Heavily calcified proximally. Severe ostial segmental 95% stenosis prior to the prior stent that is calcified. The stent has a moderate 30-40% in-stent restenosis that is focal..  Right coronary artery: Catheter damping with ostial 60-70% stenosis. Previous stent site is patent.  LEFT VENTRICULOGRAM:  Performed in the 30  RAO projection.  The aortic and mitral valves are normal. The left ventricle is normal in size with normal wall motion. Estimated ejection fraction is 60%.  IMPRESSIONS:  1. Significant three-vessel coronary artery disease 2. Normal left ventricle.Marland Kitchen  RECOMMENDATION:  Coronary artery bypass grafting, restart heparin   W. Viann Fish, Montez Hageman. MD [facility]

## 2012-02-24 NOTE — Care Management Note (Addendum)
    Page 1 of 2   03/04/2012     1:33:20 PM   CARE MANAGEMENT NOTE 03/04/2012  Patient:  BRIGHTEN, BUZZELLI   Account Number:  [id number]  Date Initiated:  02/24/2012  Documentation initiated by:  GRAVES-BIGELOW,BRENDA  Subjective/Objective Assessment:   Pt admitted with cp at rest and has cardiac hx.     Action/Plan:   CM will continue to monitor for disposition needs.   Anticipated DC Date:  03/02/2012   Anticipated DC Plan:  HOME/SELF CARE      DC Planning Services  CM consult      Choice offered to / List presented to:             Status of service:  Completed, signed off Medicare Important Message given?   (If response is "NO", the following Medicare IM given date fields will be blank) Date Medicare IM given:   Date Additional Medicare IM given:    Discharge Disposition:  HOME/SELF CARE  Per UR Regulation:  Reviewed for med. necessity/level of care/duration of stay  If discussed at Long Length of Stay Meetings, dates discussed:    Comments:  ContactKeitha [place]te Sister [phone]  03/04/12 Osmani Kersten,RN,BSN [id number] ORDER FOR HHPT FOR VESTIBULAR REHAB.  THIS IS ONLY AVAILABLE ON OUTPT BASIS AT OUTPT CENTER.  MET WITH PT TO SEE IF SHE WANTS TO ARRANGE.  PT STATES SHE HAS NO INSURANCE AND WANTS TO WAIT ON THIS RIGHT NOW, AS SHE IS CONCERNED ABOUT MULTIPLE MEDICAL BILLS.  SHE STATES SHE WILL CALL HER PRIMARY MD FOR A REFERRAL FOR THIS ONCE HER FINANCIAL SITUATION IS BETTER.  03/02/12 Bharath Bernstein,RN,BSN [phone] PT FOR DC HOME TODAY.  SISTER TO PROVIDE 24HR CARE AT DC. PT STATES SHE HAS ALL DME NEEDED AT HOME.  DENIES ANY OTHER HOME NEEDS.  02-26-12 1pm Avie Arenas, RNBSN [phone] Post op CABG x3 on 02-26-12.

## 2012-02-24 NOTE — Consult Note (Signed)
Reason for Consult:3 vessel cad Referring Physician: Dr. Viann Fish  [name] is an 62 y.o. female.  HPI: 62 yo WF with DM and known CAD presents with a cc/o chest pressure with exertion  [name] is a 62 yo with known CAD, having had a stent to the circumflex and then later one to the RCA in 2010. Her follow up has been sporadic due to financial concerns. She says that about 2 weeks ago she began having chest pressure with exertion. Pain would occur with almost any exertion such as walking to the mailbox. She thought she just needed more rest after the holidays. Her last episode of pain before admission occurred with her "not doing much of anything at all." This concerned her and she finally sought medical attention. She was admitted with an unstable coronary syndrome.  Today she had catheterization which revealed severe 3 vessel CAD with preserved LV function(EF 60%). There was no gradient across the aortic valve. She is currently pain free.  Her DM has been poorly controlled. She says she only checks her sugars in the morning and they usually run between 200 and 250.  Past Medical History  Diagnosis Date  . CAD     Coronary disease status post non-ST elevation MI in May 2010 with  PCI of the left circumflex on Jun 24, 2008. She then had a PCI of  the RCA on July 15, 2008.    . Diabetes mellitus, insulin dependent (IDDM), uncontrolled   . Hyperlipidemia   . Hypertensive heart disease   .  Paroxysmal SVT (ANVRT)     S/p AV nodal ablation Dr. Ladona Ridgel 09/28/08   . Depression     Past Surgical History  Procedure Date  . Coronary angioplasty with stent placement     No family history on file.  Social History:  reports that she has never smoked. She has never used smokeless tobacco. She reports that she does not drink alcohol or use illicit drugs.  Allergies: No Known Allergies  Medications:  Prior to Admission:  Prescriptions prior to admission  Medication Sig Dispense  Refill  . aspirin 325 MG tablet Take 325 mg by mouth at bedtime.       Marland Kitchen Coral Calcium 1000 (390 CA) MG TABS Take 1,000 mg by mouth 2 (two) times daily.      . fish oil-omega-3 fatty acids 1000 MG capsule Take 2 g by mouth 2 (two) times daily.       . hydrochlorothiazide (MICROZIDE) 12.5 MG capsule Take 12.5 mg by mouth daily.        . insulin detemir (LEVEMIR) 100 UNIT/ML injection Inject 100 Units into the skin at bedtime.       . insulin regular (NOVOLIN R,HUMULIN R) 100 units/mL injection Inject 10 Units into the skin 3 (three) times daily. 30 minutes after each meal      . metFORMIN (GLUCOPHAGE) 1000 MG tablet Take 1,000 mg by mouth 2 (two) times daily with a meal.      . PRESCRIPTION MEDICATION Take 50 mg by mouth daily. Metoprolol but patient does not know if it is tartrate or succinate - filled at [facility]      . PRESCRIPTION MEDICATION Take 25 mg by mouth every evening. Metoprolol but patient does not know if it is tartrate or succinate - filled at [facility]      . quinapril (ACCUPRIL) 40 MG tablet Take 40 mg by mouth at bedtime.      Marland Kitchen  sertraline (ZOLOFT) 50 MG tablet Take 50 mg by mouth at bedtime.       . simvastatin (ZOCOR) 40 MG tablet Take 40 mg by mouth at bedtime.         Results for orders placed during the hospital encounter of 02/22/12 (from the past 48 hour(s))  PROTIME-INR     Status: Normal   Collection Time   02/22/12  2:40 PM      Component Value Range Comment   Prothrombin Time 13.1  11.6 - 15.2 seconds    INR 1.00  0.00 - 1.49   APTT     Status: Normal   Collection Time   02/22/12  2:40 PM      Component Value Range Comment   aPTT 26  24 - 37 seconds   TSH     Status: Normal   Collection Time   02/22/12  2:40 PM      Component Value Range Comment   TSH 0.588  0.350 - 4.500 uIU/mL   COMPREHENSIVE METABOLIC PANEL     Status: Abnormal   Collection Time   02/22/12  2:40 PM      Component Value Range Comment   Sodium 138   135 - 145 mEq/L    Potassium 3.9  3.5 - 5.1 mEq/L    Chloride 100  96 - 112 mEq/L    CO2 25  19 - 32 mEq/L    Glucose, Bld 178 (*) 70 - 99 mg/dL    BUN 18  6 - 23 mg/dL    Creatinine, Ser 4.54 (*) 0.50 - 1.10 mg/dL    Calcium 09.8  8.4 - 10.5 mg/dL    Total Protein 6.8  6.0 - 8.3 g/dL    Albumin 3.6  3.5 - 5.2 g/dL    AST 23  0 - 37 U/L    ALT 29  0 - 35 U/L    Alkaline Phosphatase 38 (*) 39 - 117 U/L    Total Bilirubin 0.3  0.3 - 1.2 mg/dL    GFR calc non Af Amer >90  >90 mL/min    GFR calc Af Amer >90  >90 mL/min   HEMOGLOBIN A1C     Status: Abnormal   Collection Time   02/22/12  2:40 PM      Component Value Range Comment   Hemoglobin A1C 8.5 (*) <5.7 %    Mean Plasma Glucose 197 (*) <117 mg/dL   TROPONIN I     Status: Normal   Collection Time   02/22/12  2:41 PM      Component Value Range Comment   Troponin I <0.30  <0.30 ng/mL   GLUCOSE, CAPILLARY     Status: Abnormal   Collection Time   02/22/12  3:15 PM      Component Value Range Comment   Glucose-Capillary 157 (*) 70 - 99 mg/dL    Comment 1 Documented in Chart      Comment 2 Notify RN     GLUCOSE, CAPILLARY     Status: Abnormal   Collection Time   02/22/12  5:45 PM      Component Value Range Comment   Glucose-Capillary 117 (*) 70 - 99 mg/dL    Comment 1 Notify RN     TROPONIN I     Status: Normal   Collection Time   02/22/12  8:24 PM      Component Value Range Comment   Troponin I <0.30  <0.30 ng/mL   GLUCOSE,  CAPILLARY     Status: Abnormal   Collection Time   02/22/12  9:12 PM      Component Value Range Comment   Glucose-Capillary 144 (*) 70 - 99 mg/dL   HEPARIN LEVEL (UNFRACTIONATED)     Status: Abnormal   Collection Time   02/22/12  9:44 PM      Component Value Range Comment   Heparin Unfractionated 0.13 (*) 0.30 - 0.70 IU/mL   TROPONIN I     Status: Normal   Collection Time   02/23/12  2:58 AM      Component Value Range Comment   Troponin I <0.30  <0.30 ng/mL   LIPID PANEL     Status: Abnormal    Collection Time   02/23/12  3:03 AM      Component Value Range Comment   Cholesterol 128  0 - 200 mg/dL    Triglycerides 161 (*) <150 mg/dL    HDL 22 (*) >09 mg/dL    Total CHOL/HDL Ratio 5.8      VLDL UNABLE TO CALCULATE IF TRIGLYCERIDE OVER 400 mg/dL  0 - 40 mg/dL    LDL Cholesterol UNABLE TO CALCULATE IF TRIGLYCERIDE OVER 400 mg/dL  0 - 99 mg/dL   CBC     Status: Abnormal   Collection Time   02/23/12  3:03 AM      Component Value Range Comment   WBC 5.7  4.0 - 10.5 K/uL    RBC 3.51 (*) 3.87 - 5.11 MIL/uL    Hemoglobin 11.4 (*) 12.0 - 15.0 g/dL DELTA CHECK NOTED   HCT 32.0 (*) 36.0 - 46.0 %    MCV 91.2  78.0 - 100.0 fL    MCH 32.5  26.0 - 34.0 pg    MCHC 35.6  30.0 - 36.0 g/dL    RDW 60.4  54.0 - 98.1 %    Platelets 89 (*) 150 - 400 K/uL PLATELET COUNT CONFIRMED BY SMEAR  HEPARIN LEVEL (UNFRACTIONATED)     Status: Abnormal   Collection Time   02/23/12  6:15 AM      Component Value Range Comment   Heparin Unfractionated 0.22 (*) 0.30 - 0.70 IU/mL   GLUCOSE, CAPILLARY     Status: Abnormal   Collection Time   02/23/12  8:54 AM      Component Value Range Comment   Glucose-Capillary 268 (*) 70 - 99 mg/dL    Comment 1 Notify RN     GLUCOSE, CAPILLARY     Status: Abnormal   Collection Time   02/23/12 11:54 AM      Component Value Range Comment   Glucose-Capillary 125 (*) 70 - 99 mg/dL    Comment 1 Notify RN     HEPARIN LEVEL (UNFRACTIONATED)     Status: Abnormal   Collection Time   02/23/12 12:43 PM      Component Value Range Comment   Heparin Unfractionated 0.29 (*) 0.30 - 0.70 IU/mL   GLUCOSE, CAPILLARY     Status: Abnormal   Collection Time   02/23/12  5:03 PM      Component Value Range Comment   Glucose-Capillary 268 (*) 70 - 99 mg/dL    Comment 1 Notify RN     GLUCOSE, CAPILLARY     Status: Abnormal   Collection Time   02/23/12  9:01 PM      Component Value Range Comment   Glucose-Capillary 197 (*) 70 - 99 mg/dL   CBC     Status: Abnormal  Collection Time   02/24/12   5:50 AM      Component Value Range Comment   WBC 5.9  4.0 - 10.5 K/uL    RBC 4.09  3.87 - 5.11 MIL/uL    Hemoglobin 13.4  12.0 - 15.0 g/dL    HCT 16.1  09.6 - 04.5 %    MCV 88.8  78.0 - 100.0 fL    MCH 32.8  26.0 - 34.0 pg    MCHC 36.9 (*) 30.0 - 36.0 g/dL    RDW 40.9  81.1 - 91.4 %    Platelets 111 (*) 150 - 400 K/uL CONSISTENT WITH PREVIOUS RESULT  HEPARIN LEVEL (UNFRACTIONATED)     Status: Normal   Collection Time   02/24/12  5:50 AM      Component Value Range Comment   Heparin Unfractionated 0.34  0.30 - 0.70 IU/mL   GLUCOSE, CAPILLARY     Status: Abnormal   Collection Time   02/24/12  7:28 AM      Component Value Range Comment   Glucose-Capillary 168 (*) 70 - 99 mg/dL   GLUCOSE, CAPILLARY     Status: Abnormal   Collection Time   02/24/12  8:44 AM      Component Value Range Comment   Glucose-Capillary 179 (*) 70 - 99 mg/dL   POCT ACTIVATED CLOTTING TIME     Status: Normal   Collection Time   02/24/12  8:53 AM      Component Value Range Comment   Activated Clotting Time 170     GLUCOSE, CAPILLARY     Status: Abnormal   Collection Time   02/24/12 11:11 AM      Component Value Range Comment   Glucose-Capillary 193 (*) 70 - 99 mg/dL     No results found.  Review of Systems  Constitutional: Positive for malaise/fatigue. Negative for fever, chills and weight loss.       Sedentary  Respiratory: Negative for cough.   Cardiovascular: Positive for chest pain. Negative for palpitations.       Heart murmur  Gastrointestinal: Positive for heartburn.  Neurological: Positive for sensory change (numbness in feet).  Psychiatric/Behavioral: Positive for depression.  All other systems reviewed and are negative.   Blood pressure 122/63, pulse 78, temperature 98 F (36.7 C), temperature source Oral, resp. rate 18, height 5\' 2"  (1.575 m), weight 154 lb 4.8 oz (69.99 kg), SpO2 96.00%. Physical Exam  Vitals reviewed. Constitutional: She is oriented to person, place, and time.        Obese   HENT:  Head: Normocephalic and atraumatic.  Eyes: EOM are normal. Pupils are equal, round, and reactive to light.  Neck: No JVD present. No thyromegaly present.       + transmitted murmur bilaterally  Cardiovascular: Normal rate and regular rhythm.   Murmur (2/6 systolic at RUSB) heard. Respiratory: Effort normal and breath sounds normal. She has no wheezes. She has no rales.  GI: Soft. There is no tenderness.  Lymphadenopathy:    She has no cervical adenopathy.  Neurological: She is alert and oriented to person, place, and time. No cranial nerve deficit.  Skin: Skin is warm and dry.    Assessment/Plan: 62 yo with DM and CAD presents with new onset unstable angina and has 3 vessel CAD at catheterization. She needs CABG for survival benefit and relief of symptoms.   I have discussed with her the general nature of the procedure, need for general anesthesia and cardiopulmonary bypass, and the  incisions to be used. I discussed the expected hospital stay, overall recovery and short and long term outcomes. We discussed the indications, risks, benefits and alternatives. She understands the risks include but are not limited to death, stroke, MI, DVT/PE, bleeding, possible need for transfusion, infections, cardiac arrhythmias, and other organ system dysfunction including respiratory, renal, or GI complications. She accepts these risks and agrees to proceed.'   For OR Wednesday 02/26/12  Raylee Strehl C 02/24/2012, 1:58 PM

## 2012-02-24 NOTE — Progress Notes (Signed)
Pt's states her sister will assist her at home post CABG.  IS and book at bedside.  Pt declines CABG video at present and has no questions regarding surgery at this time.  Will continue to monitor.

## 2012-02-24 NOTE — H&P (View-Only) (Signed)
Subjective:  No chest pain overnight. EKG shows minor T wave changes in V2 and V3, troponins are negative.  Objective:  Vital Signs in the last 24 hours: BP 105/64  Pulse 72  Temp 98.3 F (36.8 C) (Oral)  Resp 16  Ht 5\' 2"  (1.575 m)  Wt 69.219 kg (152 lb 9.6 oz)  BMI 27.91 kg/m2  SpO2 96%  Physical Exam: Obese white female in no acute distress Lungs:  Clear  Cardiac:  Regular rhythm, normal S1 and S2, no S3 Extremities:  No edema present  Intake/Output from previous day: 01/18 0701 - 01/19 0700 In: 332.9 [P.O.:240; I.V.:92.9] Out: -   Weight Filed Weights   02/22/12 1154 02/22/12 1457 02/23/12 0500  Weight: 72.122 kg (159 lb) 72.122 kg (159 lb) 69.219 kg (152 lb 9.6 oz)    Lab Results: Basic Metabolic Panel:  Basename 02/22/12 1440 02/22/12 1156  NA 138 139  K 3.9 3.9  CL 100 97  CO2 25 26  GLUCOSE 178* 278*  BUN 18 17  CREATININE 0.40* 0.42*   CBC:  Basename 02/23/12 0303 02/22/12 1156  WBC 5.7 6.4  NEUTROABS -- --  HGB 11.4* 14.1  HCT 32.0* 38.7  MCV 91.2 89.6  PLT 89* 127*   Cardiac Enzymes:  Basename 02/23/12 0258 02/22/12 2024 02/22/12 1441  CKTOTAL -- -- --  CKMB -- -- --  CKMBINDEX -- -- --  TROPONINI <0.30 <0.30 <0.30    Telemetry: Sinus rhythm  Assessment/Plan: 1. Unstable angina pectoris 2. Type 2 diabetes 3. Hypertension under control 4. Hyperlipidemia 5. Obesity 6. Anxiety and depression  Recommendations:  Plan cardiac catheterization because of unstable angina and rest chest pain with known stents.Cardiac catheterization was discussed with the patient fully including risks of myocardial infarction, death, stroke, bleeding, arrhythmia, dye allergy, renal insufficiency or bleeding.  The patient understands and is willing to proceed. Possibility of stenting also discussed with patient to be done by an interventional cardiologist      W. Ashley Royalty  MD [facility]  02/23/2012, 9:16 AM

## 2012-02-25 ENCOUNTER — Encounter (HOSPITAL_COMMUNITY): Payer: Self-pay | Admitting: Anesthesiology

## 2012-02-25 ENCOUNTER — Inpatient Hospital Stay (HOSPITAL_COMMUNITY): Payer: Medicaid Other

## 2012-02-25 DIAGNOSIS — Z01810 Encounter for preprocedural cardiovascular examination: Secondary | ICD-10-CM

## 2012-02-25 LAB — BASIC METABOLIC PANEL
BUN: 16 mg/dL (ref 6–23)
Calcium: 9.3 mg/dL (ref 8.4–10.5)
GFR calc non Af Amer: >90 mL/min (ref >90)
Glucose, Bld: 272 mg/dL — ABNORMAL HIGH (ref 70–99)
Sodium: 136 mEq/L (ref 135–145)

## 2012-02-25 LAB — CBC
MCH: 32.2 pg (ref 26.0–34.0)
MCHC: 36.5 g/dL — ABNORMAL HIGH (ref 30.0–36.0)
Platelets: 147 10*3/uL — ABNORMAL LOW (ref 150–400)
RDW: 13.3 % (ref 11.5–15.5)

## 2012-02-25 LAB — BLOOD GAS, ARTERIAL
Acid-base deficit: 1.8 mmol/L (ref 0.0–2.0)
Bicarbonate: 22.1 mEq/L (ref 20.0–24.0)
FIO2: 0.21 %
O2 Saturation: 96 %
Patient temperature: 98.6
pO2, Arterial: 96.9 mmHg (ref 80.0–100.0)

## 2012-02-25 LAB — HEPARIN LEVEL (UNFRACTIONATED): Heparin Unfractionated: 0.33 IU/mL (ref 0.30–0.70)

## 2012-02-25 LAB — GLUCOSE, CAPILLARY: Glucose-Capillary: 190 mg/dL — ABNORMAL HIGH (ref 70–99)

## 2012-02-25 MED ORDER — POTASSIUM CHLORIDE 2 MEQ/ML IV SOLN
80.0000 meq | INTRAVENOUS | Status: DC
  Filled 2012-02-25: qty 40

## 2012-02-25 MED ORDER — DEXTROSE 5 % IV SOLN
750.0000 mg | INTRAVENOUS | Status: DC
  Filled 2012-02-25 (×2): qty 750

## 2012-02-25 MED ORDER — SODIUM CHLORIDE 0.9 % IV SOLN
INTRAVENOUS | Status: DC
  Administered 2012-02-26: 70 mL via INTRAVENOUS
  Administered 2012-02-26: 14 mL/h via INTRAVENOUS
  Filled 2012-02-25: qty 40

## 2012-02-25 MED ORDER — DEXMEDETOMIDINE HCL IN NACL 400 MCG/100ML IV SOLN
0.1000 ug/kg/h | INTRAVENOUS | Status: DC
  Administered 2012-02-26: 0.2 ug/kg/h via INTRAVENOUS
  Filled 2012-02-25: qty 100

## 2012-02-25 MED ORDER — VANCOMYCIN HCL 10 G IV SOLR
1250.0000 mg | INTRAVENOUS | Status: DC
  Filled 2012-02-25: qty 1250

## 2012-02-25 MED ORDER — VERAPAMIL HCL 2.5 MG/ML IV SOLN
INTRAVENOUS | Status: DC
  Filled 2012-02-25 (×2): qty 2.5

## 2012-02-25 MED ORDER — EPINEPHRINE HCL 1 MG/ML IJ SOLN
0.5000 ug/min | INTRAVENOUS | Status: DC
  Filled 2012-02-25: qty 4

## 2012-02-25 MED ORDER — DOPAMINE-DEXTROSE 3.2-5 MG/ML-% IV SOLN
2.0000 ug/kg/min | INTRAVENOUS | Status: DC
  Filled 2012-02-25: qty 250

## 2012-02-25 MED ORDER — CEFUROXIME SODIUM 1.5 G IJ SOLR
1.5000 g | INTRAMUSCULAR | Status: DC
  Filled 2012-02-25: qty 1.5

## 2012-02-25 MED ORDER — SODIUM CHLORIDE 0.9 % IV SOLN
INTRAVENOUS | Status: DC
  Administered 2012-02-26 (×2): 1 [IU]/h via INTRAVENOUS
  Filled 2012-02-25: qty 1

## 2012-02-25 MED ORDER — NITROGLYCERIN IN D5W 200-5 MCG/ML-% IV SOLN
2.0000 ug/min | INTRAVENOUS | Status: DC
  Administered 2012-02-26: 16.6 ug/min via INTRAVENOUS
  Filled 2012-02-25: qty 250

## 2012-02-25 MED ORDER — MAGNESIUM SULFATE 50 % IJ SOLN
40.0000 meq | INTRAMUSCULAR | Status: DC
  Filled 2012-02-25: qty 10

## 2012-02-25 MED ORDER — PHENYLEPHRINE HCL 10 MG/ML IJ SOLN
30.0000 ug/min | INTRAVENOUS | Status: DC
  Filled 2012-02-25: qty 2

## 2012-02-25 NOTE — Progress Notes (Signed)
Pre-op Cardiac Surgery  Carotid Findings:  No obvious evidence of hemodynamically significant internal carotid artery stenosis bilaterally. There is evidence of elevated right external carotid artery velocities, suggestive of stenosis.  Upper Extremity Right Left  Brachial Pressures 119-Triphasic 120-Triphasic  Radial Waveforms Triphasic Triphasic  Ulnar Waveforms Triphasic Triphasic  Palmar Arch (Allen's Test) Signal obliterates with radial compression, is unaffected with ulnar compression. Signal obliterates with radial compression, is unaffected with ulnar compression.     Lower  Extremity Right Left  Dorsalis Pedis    Anterior Tibial    Posterior Tibial    Ankle/Brachial Indices      Findings:  Bilateral palpable pedal pulses.  Upper extremity dopplers are within normal limits.    02/25/2012 12:29 PM Gertie Fey, RDMS, RDCS

## 2012-02-25 NOTE — Progress Notes (Signed)
ANTICOAGULATION CONSULT NOTE - Follow Up Consult  Pharmacy Consult for Heparin Indication: chest pain/ACS  No Known Allergies  Patient Measurements: Height: 5\' 2"  (157.5 cm) Weight: 154 lb 4.8 oz (69.99 kg) IBW/kg (Calculated) : 50.1  Heparin Dosing Weight: 64.6kg  Vital Signs: Temp: 98.7 F (37.1 C) (01/20 2100) BP: 138/85 mmHg (01/20 2100) Pulse Rate: 94  (01/20 2100)  Labs:  Basename 02/25/12 0100 02/24/12 0550 02/23/12 1243 02/23/12 0303 02/23/12 0258 02/22/12 2024 02/22/12 1441 02/22/12 1440 02/22/12 1156  HGB -- 13.4 -- 11.4* -- -- -- -- --  HCT -- 36.3 -- 32.0* -- -- -- -- 38.7  PLT -- 111* -- 89* -- -- -- -- 127*  APTT -- -- -- -- -- -- -- 26 --  LABPROT -- -- -- -- -- -- -- 13.1 --  INR -- -- -- -- -- -- -- 1.00 --  HEPARINUNFRC 0.30 0.34 0.29* -- -- -- -- -- --  CREATININE -- -- -- -- -- -- -- 0.40* 0.42*  CKTOTAL -- -- -- -- -- -- -- -- --  CKMB -- -- -- -- -- -- -- -- --  TROPONINI -- -- -- -- <0.30 <0.30 <0.30 -- --    Estimated Creatinine Clearance: 66.9 ml/min (by C-G formula based on Cr of 0.4).  Assessment: 44 yof now s/p cath which demonstrated significant 3V CAD recommended for CABG. Heparin is on low end of therapeutic range. Pt remains anemic and thrombocytopenic but this is relatively stable. Plan CABG 02/26/12  Goal of Therapy:  Heparin level 0.3-0.7 units/ml Monitor platelets by anticoagulation protocol: Yes   Plan:  1. Continue heparin at 1250 units/hr 2. Check an 8 hour heparin level 3. Daily heparin level and CBC   Talbert Cage, PharmD Pager # [phone] 02/25/2012 1:41 AM

## 2012-02-25 NOTE — Progress Notes (Signed)
1 Day Post-Op Procedure(s) (LRB): LEFT HEART CATHETERIZATION WITH CORONARY ANGIOGRAM (N/A) Subjective: No complaints, denies CP, SOB  Objective: Vital signs in last 24 hours: Temp:  [98.1 F (36.7 C)-98.7 F (37.1 C)] 98.1 F (36.7 C) (01/21 1400) Pulse Rate:  [70-94] 79  (01/21 1400) Cardiac Rhythm:  [-] Normal sinus rhythm (01/21 0725) Resp:  [18] 18  (01/21 1400) BP: (97-138)/(62-85) 132/70 mmHg (01/21 1400) SpO2:  [92 %-97 %] 94 % (01/21 1400) Weight:  [157 lb 3.2 oz (71.305 kg)] 157 lb 3.2 oz (71.305 kg) (01/21 0500)  Hemodynamic parameters for last 24 hours:    Intake/Output from previous day: 01/20 0701 - 01/21 0700 In: 1230 [P.O.:240; I.V.:990] Out: -  Intake/Output this shift: Total I/O In: 360 [P.O.:360] Out: -   General appearance: alert and no distress Neurologic: intact Heart: regular rate and rhythm Lungs: clear to auscultation bilaterally  Lab Results:  Basename 02/25/12 0100 02/24/12 0550  WBC 7.0 5.9  HGB 13.8 13.4  HCT 37.8 36.3  PLT 147* 111*   BMET:  Basename 02/25/12 0100  NA 136  K 3.7  CL 98  CO2 26  GLUCOSE 272*  BUN 16  CREATININE 0.56  CALCIUM 9.3    PT/INR: No results found for this basename: LABPROT,INR in the last 72 hours ABG    Component Value Date/Time   HCO3 22.3 04/05/2007 0059   TCO2 24 09/15/2008 1416   ACIDBASEDEF 2.0 04/05/2007 0059   CBG (last 3)   Basename 02/25/12 1650 02/25/12 1212 02/25/12 0722  GLUCAP 190* 207* 188*    Assessment/Plan: S/P Procedure(s) (LRB): LEFT HEART CATHETERIZATION WITH CORONARY ANGIOGRAM (N/A) For CABG in AM Carotids OK Palmar arches obliterate with radial compression PFTs OK All questions answered She is aware of risks and benefits and wishes to proceed   LOS: 3 days    Drae Mitzel C 02/25/2012

## 2012-02-25 NOTE — Progress Notes (Signed)
Subjective:  No chest pain overnight.  For CABG in am.   Objective:  Vital Signs in the last 24 hours: BP 116/64  Pulse 74  Temp 98.3 F (36.8 C) (Oral)  Resp 18  Ht 5\' 2"  (1.575 m)  Wt 71.305 kg (157 lb 3.2 oz)  BMI 28.75 kg/m2  SpO2 97%  Physical Exam: Obese white female in no acute distress Lungs:  Clear  Cardiac:  Regular rhythm, normal S1 and S2, no S3 Extremities:  Cath site clean and dry   Intake/Output from previous day: 01/20 0701 - 01/21 0700 In: 1230 [P.O.:240; I.V.:990] Out: -   Weight Filed Weights   02/23/12 0500 02/24/12 0500 02/25/12 0500  Weight: 69.219 kg (152 lb 9.6 oz) 69.99 kg (154 lb 4.8 oz) 71.305 kg (157 lb 3.2 oz)    Lab Results: Basic Metabolic Panel:  Basename 02/25/12 0100 02/22/12 1440  NA 136 138  K 3.7 3.9  CL 98 100  CO2 26 25  GLUCOSE 272* 178*  BUN 16 18  CREATININE 0.56 0.40*   CBC:  Basename 02/25/12 0100 02/24/12 0550  WBC 7.0 5.9  NEUTROABS -- --  HGB 13.8 13.4  HCT 37.8 36.3  MCV 88.1 88.8  PLT 147* 111*   Cardiac Enzymes:  Basename 02/23/12 0258 02/22/12 2024 02/22/12 1441  CKTOTAL -- -- --  CKMB -- -- --  CKMBINDEX -- -- --  TROPONINI <0.30 <0.30 <0.30    Telemetry: Sinus rhythm  Assessment/Plan: 1. Unstable angina pectoris with 3VD at cath not amenable for PCI 2. Type 2 diabetes 3. Hypertension under control 4. Hyperlipidemia 5. Obesity 6. Anxiety and depression  Recommendations:  For CABG in am.  Questions answered.   Darden Palmer  MD [facility]  02/25/2012, 1:42 PM

## 2012-02-25 NOTE — Progress Notes (Signed)
ANTICOAGULATION CONSULT NOTE - Follow Up Consult  Pharmacy Consult for Heparin Indication: 3V CAD awaiting CABG  No Known Allergies  Patient Measurements: Height: 5\' 2"  (157.5 cm) Weight: 157 lb 3.2 oz (71.305 kg) IBW/kg (Calculated) : 50.1  Heparin Dosing Weight: 64.6kg  Vital Signs: Temp: 98.3 F (36.8 C) (01/21 0500) BP: 116/64 mmHg (01/21 0926) Pulse Rate: 74  (01/21 0926)  Labs:  Basename 02/25/12 1035 02/25/12 0100 02/24/12 0550 02/23/12 0303 02/23/12 0258 02/22/12 2024 02/22/12 1441 02/22/12 1440  HGB -- 13.8 13.4 -- -- -- -- --  HCT -- 37.8 36.3 32.0* -- -- -- --  PLT -- 147* 111* 89* -- -- -- --  APTT -- -- -- -- -- -- -- 26  LABPROT -- -- -- -- -- -- -- 13.1  INR -- -- -- -- -- -- -- 1.00  HEPARINUNFRC 0.33 0.30 0.34 -- -- -- -- --  CREATININE -- 0.56 -- -- -- -- -- 0.40*  CKTOTAL -- -- -- -- -- -- -- --  CKMB -- -- -- -- -- -- -- --  TROPONINI -- -- -- -- <0.30 <0.30 <0.30 --    Estimated Creatinine Clearance: 67.5 ml/min (by C-G formula based on Cr of 0.56).  Assessment: 30 yof s/p cath 1/20 which demonstrated significant 3V CAD recommended for CABG. Heparin level remains therapeutic. No bleeding noted. Platelets improving, H/H stable. Plan CABG 02/26/12  Goal of Therapy:  Heparin level 0.3-0.7 units/ml Monitor platelets by anticoagulation protocol: Yes   Plan:  1. Continue heparin at 1250 units/hr 2. Daily heparin level and CBC 3. F/u CABG plans  Lysle Pearl, PharmD, BCPS Pager # [phone] 02/25/2012 12:08 PM

## 2012-02-26 ENCOUNTER — Inpatient Hospital Stay (HOSPITAL_COMMUNITY): Payer: Medicaid Other | Admitting: Anesthesiology

## 2012-02-26 ENCOUNTER — Encounter (HOSPITAL_COMMUNITY)
Admission: EM | Disposition: A | Discharge status: Home / Self Care | Payer: Self-pay | Source: Home / Self Care | Attending: Thoracic Surgery (Cardiothoracic Vascular Surgery)

## 2012-02-26 ENCOUNTER — Encounter (HOSPITAL_COMMUNITY): Payer: Self-pay | Admitting: Anesthesiology

## 2012-02-26 ENCOUNTER — Inpatient Hospital Stay (HOSPITAL_COMMUNITY): Payer: Medicaid Other

## 2012-02-26 DIAGNOSIS — I2510 Atherosclerotic heart disease of native coronary artery without angina pectoris: Secondary | ICD-10-CM

## 2012-02-26 HISTORY — PX: ENDOVEIN HARVEST OF GREATER SAPHENOUS VEIN: SHX5059

## 2012-02-26 HISTORY — PX: CORONARY ARTERY BYPASS GRAFT: SHX141

## 2012-02-26 LAB — PREPARE PLATELET PHERESIS: Unit division: 0

## 2012-02-26 LAB — URINE CULTURE

## 2012-02-26 LAB — MAGNESIUM: Magnesium: 2.6 mg/dL — ABNORMAL HIGH (ref 1.5–2.5)

## 2012-02-26 LAB — POCT I-STAT 4, (NA,K, GLUC, HGB,HCT)
Glucose, Bld: 189 mg/dL — ABNORMAL HIGH (ref 70–99)
Glucose, Bld: 226 mg/dL — ABNORMAL HIGH (ref 70–99)
Glucose, Bld: 177 mg/dL — ABNORMAL HIGH (ref 70–99)
HCT: 23 % — ABNORMAL LOW (ref 36.0–46.0)
Hemoglobin: 7.5 g/dL — ABNORMAL LOW (ref 12.0–15.0)
Hemoglobin: 7.8 g/dL — ABNORMAL LOW (ref 12.0–15.0)
Hemoglobin: 8.8 g/dL — ABNORMAL LOW (ref 12.0–15.0)
Potassium: 3.8 mEq/L (ref 3.5–5.1)
Potassium: 4.3 mEq/L (ref 3.5–5.1)
Potassium: 4.4 mEq/L (ref 3.5–5.1)
Potassium: 3.8 mEq/L (ref 3.5–5.1)
Sodium: 139 mEq/L (ref 135–145)
Sodium: 138 mEq/L (ref 135–145)

## 2012-02-26 LAB — CBC
HCT: 33.4 % — ABNORMAL LOW (ref 36.0–46.0)
HCT: 23.4 % — ABNORMAL LOW (ref 36.0–46.0)
Hemoglobin: 12.2 g/dL (ref 12.0–15.0)
MCH: 32.5 pg (ref 26.0–34.0)
MCH: 32.4 pg (ref 26.0–34.0)
MCHC: 36.5 g/dL — ABNORMAL HIGH (ref 30.0–36.0)
MCV: 89.3 fL (ref 78.0–100.0)
MCV: 88.9 fL (ref 78.0–100.0)
Platelets: 100 10*3/uL — ABNORMAL LOW (ref 150–400)
RBC: 2.62 MIL/uL — ABNORMAL LOW (ref 3.87–5.11)
RBC: 3.06 MIL/uL — ABNORMAL LOW (ref 3.87–5.11)
RDW: 13.1 % (ref 11.5–15.5)
WBC: 5.6 10*3/uL (ref 4.0–10.5)
WBC: 11.5 10*3/uL — ABNORMAL HIGH (ref 4.0–10.5)

## 2012-02-26 LAB — POCT I-STAT 3, ART BLOOD GAS (G3+)
Bicarbonate: 24.6 mEq/L — ABNORMAL HIGH (ref 20.0–24.0)
O2 Saturation: 100 %
O2 Saturation: 93 %
Patient temperature: 37.3
TCO2: 26 mmol/L (ref 0–100)
TCO2: 26 mmol/L (ref 0–100)
TCO2: 22 mmol/L (ref 0–100)
pCO2 arterial: 47.8 mmHg — ABNORMAL HIGH (ref 35.0–45.0)
pCO2 arterial: 40.5 mmHg (ref 35.0–45.0)
pH, Arterial: 7.32 — ABNORMAL LOW (ref 7.350–7.450)
pH, Arterial: 7.352 (ref 7.350–7.450)
pO2, Arterial: 71 mmHg — ABNORMAL LOW (ref 80.0–100.0)

## 2012-02-26 LAB — POCT I-STAT, CHEM 8
Calcium, Ion: 1.16 mmol/L (ref 1.13–1.30)
Chloride: 108 mEq/L (ref 96–112)
HCT: 25 % — ABNORMAL LOW (ref 36.0–46.0)
Hemoglobin: 8.5 g/dL — ABNORMAL LOW (ref 12.0–15.0)
TCO2: 22 mmol/L (ref 0–100)

## 2012-02-26 LAB — CREATININE, SERUM
Creatinine, Ser: 0.38 mg/dL — ABNORMAL LOW (ref 0.50–1.10)
GFR calc Af Amer: >90 mL/min (ref >90)
GFR calc non Af Amer: >90 mL/min (ref >90)

## 2012-02-26 LAB — GLUCOSE, CAPILLARY: Glucose-Capillary: 185 mg/dL — ABNORMAL HIGH (ref 70–99)

## 2012-02-26 LAB — APTT: aPTT: 29 seconds (ref 24–37)

## 2012-02-26 LAB — PLATELET COUNT: Platelets: 108 10*3/uL — ABNORMAL LOW (ref 150–400)

## 2012-02-26 SURGERY — CORONARY ARTERY BYPASS GRAFTING (CABG)
Anesthesia: General | Site: Leg Upper | Wound class: Clean

## 2012-02-26 MED ORDER — FAMOTIDINE IN NACL 20-0.9 MG/50ML-% IV SOLN
20.0000 mg | Freq: Two times a day (BID) | INTRAVENOUS | Status: DC
  Administered 2012-02-26: 20 mg via INTRAVENOUS

## 2012-02-26 MED ORDER — MORPHINE SULFATE 2 MG/ML IJ SOLN
2.0000 mg | INTRAMUSCULAR | Status: DC | PRN
  Administered 2012-02-27 – 2012-02-28 (×3): 2 mg via INTRAVENOUS
  Filled 2012-02-26 (×2): qty 1

## 2012-02-26 MED ORDER — LIDOCAINE HCL (CARDIAC) 20 MG/ML IV SOLN
INTRAVENOUS | Status: DC | PRN
  Administered 2012-02-26: 70 mg via INTRAVENOUS

## 2012-02-26 MED ORDER — ROCURONIUM BROMIDE 100 MG/10ML IV SOLN
INTRAVENOUS | Status: DC | PRN
  Administered 2012-02-26: 50 mg via INTRAVENOUS

## 2012-02-26 MED ORDER — LACTATED RINGERS IV SOLN
INTRAVENOUS | Status: DC | PRN
  Administered 2012-02-26: 08:00:00 via INTRAVENOUS

## 2012-02-26 MED ORDER — MAGNESIUM SULFATE 40 MG/ML IJ SOLN
4.0000 g | Freq: Once | INTRAMUSCULAR | Status: AC
  Administered 2012-02-26: 4 g via INTRAVENOUS
  Filled 2012-02-26: qty 100

## 2012-02-26 MED ORDER — HEMOSTATIC AGENTS (NO CHARGE) OPTIME
TOPICAL | Status: DC | PRN
  Administered 2012-02-26: 1 via TOPICAL

## 2012-02-26 MED ORDER — ACETAMINOPHEN 10 MG/ML IV SOLN
1000.0000 mg | Freq: Once | INTRAVENOUS | Status: AC
  Administered 2012-02-26: 1000 mg via INTRAVENOUS
  Filled 2012-02-26: qty 100

## 2012-02-26 MED ORDER — MORPHINE SULFATE 2 MG/ML IJ SOLN
1.0000 mg | INTRAMUSCULAR | Status: AC | PRN
  Administered 2012-02-26 (×2): 4 mg via INTRAVENOUS
  Filled 2012-02-26: qty 1
  Filled 2012-02-26 (×2): qty 2

## 2012-02-26 MED ORDER — METOPROLOL TARTRATE 1 MG/ML IV SOLN
2.5000 mg | INTRAVENOUS | Status: DC | PRN
  Administered 2012-02-28: 2.5 mg via INTRAVENOUS

## 2012-02-26 MED ORDER — METOPROLOL TARTRATE 12.5 MG HALF TABLET
12.5000 mg | ORAL_TABLET | Freq: Two times a day (BID) | ORAL | Status: DC
  Administered 2012-02-27: 12.5 mg via ORAL
  Filled 2012-02-26 (×5): qty 1

## 2012-02-26 MED ORDER — ONDANSETRON HCL 4 MG/2ML IJ SOLN: 4.0000 mg | Freq: Four times a day (QID) | INTRAMUSCULAR | Status: DC | PRN

## 2012-02-26 MED ORDER — SODIUM CHLORIDE 0.9 % IV SOLN
1250.0000 mg | INTRAVENOUS | Status: DC | PRN
  Administered 2012-02-26: 1250 mg via INTRAVENOUS

## 2012-02-26 MED ORDER — SODIUM CHLORIDE 0.9 % IJ SOLN: 3.0000 mL | INTRAMUSCULAR | Status: DC | PRN

## 2012-02-26 MED ORDER — SODIUM CHLORIDE 0.9 % IV SOLN
INTRAVENOUS | Status: DC
  Administered 2012-02-26: 8.4 [IU]/h via INTRAVENOUS
  Filled 2012-02-26: qty 1

## 2012-02-26 MED ORDER — MORPHINE SULFATE 2 MG/ML IJ SOLN: 2.0000 mg | INTRAMUSCULAR | Status: DC | PRN

## 2012-02-26 MED ORDER — ASPIRIN 81 MG PO CHEW
324.0000 mg | CHEWABLE_TABLET | Freq: Every day | ORAL | Status: DC
  Administered 2012-02-28: 324 mg
  Filled 2012-02-26: qty 4

## 2012-02-26 MED ORDER — SODIUM CHLORIDE 0.9 % IJ SOLN
3.0000 mL | Freq: Two times a day (BID) | INTRAMUSCULAR | Status: DC
  Administered 2012-02-27 – 2012-02-28 (×3): 3 mL via INTRAVENOUS

## 2012-02-26 MED ORDER — MUPIROCIN 2 % EX OINT
1.0000 "application " | TOPICAL_OINTMENT | Freq: Two times a day (BID) | CUTANEOUS | Status: AC
  Administered 2012-02-26 – 2012-03-02 (×10): 1 via NASAL
  Filled 2012-02-26: qty 22

## 2012-02-26 MED ORDER — VANCOMYCIN HCL IN DEXTROSE 1-5 GM/200ML-% IV SOLN
1000.0000 mg | Freq: Once | INTRAVENOUS | Status: AC
  Administered 2012-02-26: 1000 mg via INTRAVENOUS
  Filled 2012-02-26: qty 200

## 2012-02-26 MED ORDER — BISACODYL 5 MG PO TBEC
10.0000 mg | DELAYED_RELEASE_TABLET | Freq: Every day | ORAL | Status: DC
  Administered 2012-02-27 – 2012-02-29 (×3): 10 mg via ORAL
  Filled 2012-02-26 (×3): qty 2

## 2012-02-26 MED ORDER — PANTOPRAZOLE SODIUM 40 MG PO TBEC
40.0000 mg | DELAYED_RELEASE_TABLET | Freq: Every day | ORAL | Status: DC
  Administered 2012-02-28 – 2012-03-04 (×6): 40 mg via ORAL
  Filled 2012-02-26 (×6): qty 1

## 2012-02-26 MED ORDER — SODIUM CHLORIDE 0.9 % IV SOLN
INTRAVENOUS | Status: DC | PRN
  Administered 2012-02-26: 13:00:00 via INTRAVENOUS

## 2012-02-26 MED ORDER — OXYCODONE HCL 5 MG PO TABS
5.0000 mg | ORAL_TABLET | ORAL | Status: DC | PRN
  Administered 2012-02-26 – 2012-03-02 (×6): 10 mg via ORAL
  Filled 2012-02-26 (×6): qty 2

## 2012-02-26 MED ORDER — SODIUM CHLORIDE 0.9 % IJ SOLN
OROMUCOSAL | Status: DC | PRN
  Administered 2012-02-26: 10:00:00 via TOPICAL

## 2012-02-26 MED ORDER — PHENYLEPHRINE HCL 10 MG/ML IJ SOLN
0.0000 ug/min | INTRAVENOUS | Status: DC
  Filled 2012-02-26: qty 2

## 2012-02-26 MED ORDER — MIDAZOLAM HCL 2 MG/2ML IJ SOLN
2.0000 mg | INTRAMUSCULAR | Status: DC | PRN
  Administered 2012-02-26: 2 mg via INTRAVENOUS

## 2012-02-26 MED ORDER — SODIUM CHLORIDE 0.9 % IV SOLN: INTRAVENOUS | Status: DC

## 2012-02-26 MED ORDER — ALBUMIN HUMAN 5 % IV SOLN: 250.0000 mL | INTRAVENOUS | Status: AC | PRN

## 2012-02-26 MED ORDER — FENTANYL CITRATE 0.05 MG/ML IJ SOLN
INTRAMUSCULAR | Status: DC | PRN
  Administered 2012-02-26 (×5): 250 ug via INTRAVENOUS

## 2012-02-26 MED ORDER — SIMVASTATIN 40 MG PO TABS
40.0000 mg | ORAL_TABLET | Freq: Every day | ORAL | Status: DC
  Administered 2012-02-27 – 2012-02-28 (×2): 40 mg via ORAL
  Filled 2012-02-26 (×3): qty 1

## 2012-02-26 MED ORDER — MIDAZOLAM HCL 2 MG/2ML IJ SOLN
INTRAMUSCULAR | Status: AC
  Filled 2012-02-26: qty 2

## 2012-02-26 MED ORDER — SERTRALINE HCL 50 MG PO TABS
50.0000 mg | ORAL_TABLET | Freq: Every day | ORAL | Status: DC
  Administered 2012-02-27 – 2012-03-03 (×6): 50 mg via ORAL
  Filled 2012-02-26 (×7): qty 1

## 2012-02-26 MED ORDER — MIDAZOLAM HCL 5 MG/5ML IJ SOLN
INTRAMUSCULAR | Status: DC | PRN
  Administered 2012-02-26: 1 mg via INTRAVENOUS
  Administered 2012-02-26: 2 mg via INTRAVENOUS
  Administered 2012-02-26: 1 mg via INTRAVENOUS
  Administered 2012-02-26: 2 mg via INTRAVENOUS
  Administered 2012-02-26: 1 mg via INTRAVENOUS

## 2012-02-26 MED ORDER — METOPROLOL TARTRATE 25 MG/10 ML ORAL SUSPENSION
12.5000 mg | Freq: Two times a day (BID) | ORAL | Status: DC
  Filled 2012-02-26 (×5): qty 5

## 2012-02-26 MED ORDER — INSULIN REGULAR BOLUS VIA INFUSION
0.0000 [IU] | Freq: Three times a day (TID) | INTRAVENOUS | Status: DC
  Filled 2012-02-26: qty 10

## 2012-02-26 MED ORDER — SODIUM CHLORIDE 0.9 % IV SOLN: 250.0000 mL | INTRAVENOUS | Status: DC | PRN

## 2012-02-26 MED ORDER — LACTATED RINGERS IV SOLN: INTRAVENOUS | Status: DC

## 2012-02-26 MED ORDER — VECURONIUM BROMIDE 10 MG IV SOLR
INTRAVENOUS | Status: DC | PRN
  Administered 2012-02-26 (×2): 8 mg via INTRAVENOUS
  Administered 2012-02-26: 4 mg via INTRAVENOUS

## 2012-02-26 MED ORDER — POTASSIUM CHLORIDE 10 MEQ/50ML IV SOLN
10.0000 meq | INTRAVENOUS | Status: AC
  Administered 2012-02-26 (×3): 10 meq via INTRAVENOUS

## 2012-02-26 MED ORDER — CEFUROXIME SODIUM 1.5 G IJ SOLR
1.5000 g | Freq: Two times a day (BID) | INTRAMUSCULAR | Status: AC
  Administered 2012-02-26 – 2012-02-28 (×4): 1.5 g via INTRAVENOUS
  Filled 2012-02-26 (×4): qty 1.5

## 2012-02-26 MED ORDER — ACETAMINOPHEN 160 MG/5ML PO SOLN
975.0000 mg | Freq: Four times a day (QID) | ORAL | Status: AC
  Filled 2012-02-26: qty 40.6

## 2012-02-26 MED ORDER — CHLORHEXIDINE GLUCONATE CLOTH 2 % EX PADS
6.0000 | MEDICATED_PAD | Freq: Every day | CUTANEOUS | Status: AC
  Administered 2012-02-26 – 2012-03-01 (×5): 6 via TOPICAL

## 2012-02-26 MED ORDER — SODIUM CHLORIDE 0.45 % IV SOLN
INTRAVENOUS | Status: DC
  Administered 2012-02-26: 20 mL/h via INTRAVENOUS

## 2012-02-26 MED ORDER — LACTATED RINGERS IV SOLN: 500.0000 mL | Freq: Once | INTRAVENOUS | Status: AC | PRN

## 2012-02-26 MED ORDER — HEPARIN SODIUM (PORCINE) 1000 UNIT/ML IJ SOLN
INTRAMUSCULAR | Status: DC | PRN
  Administered 2012-02-26: 14000 [IU] via INTRAVENOUS
  Administered 2012-02-26: 2000 [IU] via INTRAVENOUS

## 2012-02-26 MED ORDER — ASPIRIN EC 325 MG PO TBEC
325.0000 mg | DELAYED_RELEASE_TABLET | Freq: Every day | ORAL | Status: DC
  Administered 2012-02-27: 325 mg via ORAL
  Filled 2012-02-26 (×2): qty 1

## 2012-02-26 MED ORDER — PROTAMINE SULFATE 10 MG/ML IV SOLN
INTRAVENOUS | Status: DC | PRN
  Administered 2012-02-26: 120 mg via INTRAVENOUS

## 2012-02-26 MED ORDER — PHENYLEPHRINE HCL 10 MG/ML IJ SOLN
INTRAMUSCULAR | Status: DC | PRN
  Administered 2012-02-26 (×6): 40 ug via INTRAVENOUS
  Administered 2012-02-26: 80 ug via INTRAVENOUS

## 2012-02-26 MED ORDER — ACETAMINOPHEN 500 MG PO TABS
1000.0000 mg | ORAL_TABLET | Freq: Four times a day (QID) | ORAL | Status: AC
Start: 2012-02-27 — End: 2012-03-02
  Administered 2012-02-27 – 2012-03-02 (×19): 1000 mg via ORAL
  Filled 2012-02-26 (×20): qty 2

## 2012-02-26 MED ORDER — PROPOFOL 10 MG/ML IV BOLUS
INTRAVENOUS | Status: DC | PRN
  Administered 2012-02-26: 60 mg via INTRAVENOUS

## 2012-02-26 MED ORDER — 0.9 % SODIUM CHLORIDE (POUR BTL) OPTIME
TOPICAL | Status: DC | PRN
  Administered 2012-02-26: 6000 mL

## 2012-02-26 MED ORDER — CEFUROXIME SODIUM 1.5 G IJ SOLR
1.5000 g | INTRAMUSCULAR | Status: DC | PRN
  Administered 2012-02-26: 1.5 g via INTRAVENOUS

## 2012-02-26 MED ORDER — DOCUSATE SODIUM 100 MG PO CAPS
200.0000 mg | ORAL_CAPSULE | Freq: Every day | ORAL | Status: DC
  Administered 2012-02-27 – 2012-03-04 (×5): 200 mg via ORAL
  Filled 2012-02-26 (×7): qty 2

## 2012-02-26 MED ORDER — ALBUMIN HUMAN 5 % IV SOLN
INTRAVENOUS | Status: DC | PRN
  Administered 2012-02-26 (×2): via INTRAVENOUS

## 2012-02-26 MED ORDER — NITROGLYCERIN IN D5W 200-5 MCG/ML-% IV SOLN: 0.0000 ug/min | INTRAVENOUS | Status: DC

## 2012-02-26 MED ORDER — BISACODYL 10 MG RE SUPP: 10.0000 mg | Freq: Every day | RECTAL | Status: DC

## 2012-02-26 MED ORDER — DEXMEDETOMIDINE HCL IN NACL 200 MCG/50ML IV SOLN: 0.1000 ug/kg/h | INTRAVENOUS | Status: DC

## 2012-02-26 SURGICAL SUPPLY — 89 items
ATTRACTOMAT 16X20 MAGNETIC DRP (DRAPES) ×3 IMPLANT
BAG DECANTER FOR FLEXI CONT (MISCELLANEOUS) ×3 IMPLANT
BANDAGE ELASTIC 4 VELCRO ST LF (GAUZE/BANDAGES/DRESSINGS) ×3 IMPLANT
BANDAGE ELASTIC 6 VELCRO ST LF (GAUZE/BANDAGES/DRESSINGS) ×3 IMPLANT
BANDAGE GAUZE ELAST BULKY 4 IN (GAUZE/BANDAGES/DRESSINGS) ×3 IMPLANT
BASKET HEART (ORDER IN 25'S) (MISCELLANEOUS) ×1
BASKET HEART (ORDER IN 25S) (MISCELLANEOUS) ×2 IMPLANT
BLADE STERNUM SYSTEM 6 (BLADE) ×3 IMPLANT
CANISTER SUCTION 2500CC (MISCELLANEOUS) ×3 IMPLANT
CATH CPB KIT HENDRICKSON (MISCELLANEOUS) ×3 IMPLANT
CATH ROBINSON RED A/P 18FR (CATHETERS) ×3 IMPLANT
CATH THORACIC 36FR (CATHETERS) ×3 IMPLANT
CATH THORACIC 36FR RT ANG (CATHETERS) ×3 IMPLANT
CLIP TI MEDIUM 24 (CLIP) IMPLANT
CLIP TI WIDE RED SMALL 24 (CLIP) ×1 IMPLANT
CLOTH BEACON ORANGE TIMEOUT ST (SAFETY) ×3 IMPLANT
COVER SURGICAL LIGHT HANDLE (MISCELLANEOUS) ×3 IMPLANT
CRADLE DONUT ADULT HEAD (MISCELLANEOUS) ×3 IMPLANT
DRAPE CARDIOVASCULAR INCISE (DRAPES) ×3
DRAPE SLUSH/WARMER DISC (DRAPES) ×3 IMPLANT
DRAPE SRG 135X102X78XABS (DRAPES) ×2 IMPLANT
DRSG COVADERM 4X14 (GAUZE/BANDAGES/DRESSINGS) ×3 IMPLANT
ELECT REM PT RETURN 9FT ADLT (ELECTROSURGICAL) ×6
ELECTRODE REM PT RTRN 9FT ADLT (ELECTROSURGICAL) ×4 IMPLANT
GLOVE BIO SURGEON STRL SZ 6 (GLOVE) ×2 IMPLANT
GLOVE BIO SURGEON STRL SZ 6.5 (GLOVE) ×2 IMPLANT
GLOVE BIO SURGEON STRL SZ7.5 (GLOVE) ×2 IMPLANT
GLOVE BIO SURGEON STRL SZ8 (GLOVE) ×2 IMPLANT
GLOVE BIOGEL PI IND STRL 6 (GLOVE) IMPLANT
GLOVE BIOGEL PI IND STRL 6.5 (GLOVE) IMPLANT
GLOVE BIOGEL PI IND STRL 7.0 (GLOVE) IMPLANT
GLOVE BIOGEL PI INDICATOR 6 (GLOVE) ×1
GLOVE BIOGEL PI INDICATOR 6.5 (GLOVE) ×1
GLOVE BIOGEL PI INDICATOR 7.0 (GLOVE) ×3
GLOVE EUDERMIC 7 POWDERFREE (GLOVE) ×9 IMPLANT
GOWN PREVENTION PLUS XLARGE (GOWN DISPOSABLE) ×7 IMPLANT
GOWN STRL NON-REIN LRG LVL3 (GOWN DISPOSABLE) ×14 IMPLANT
HEMOSTAT POWDER SURGIFOAM 1G (HEMOSTASIS) ×9 IMPLANT
HEMOSTAT SURGICEL 2X14 (HEMOSTASIS) ×3 IMPLANT
INSERT FOGARTY XLG (MISCELLANEOUS) IMPLANT
KIT BASIN OR (CUSTOM PROCEDURE TRAY) ×3 IMPLANT
KIT ROOM TURNOVER OR (KITS) ×3 IMPLANT
KIT SUCTION CATH 14FR (SUCTIONS) ×6 IMPLANT
KIT VASOVIEW W/TROCAR VH 2000 (KITS) ×3 IMPLANT
MARKER GRAFT CORONARY BYPASS (MISCELLANEOUS) ×9 IMPLANT
NS IRRIG 1000ML POUR BTL (IV SOLUTION) ×16 IMPLANT
PACK OPEN HEART (CUSTOM PROCEDURE TRAY) ×3 IMPLANT
PAD ARMBOARD 7.5X6 YLW CONV (MISCELLANEOUS) ×6 IMPLANT
PENCIL BUTTON HOLSTER BLD 10FT (ELECTRODE) ×3 IMPLANT
PUNCH AORTIC ROTATE 4.0MM (MISCELLANEOUS) ×1 IMPLANT
PUNCH AORTIC ROTATE 4.5MM 8IN (MISCELLANEOUS) IMPLANT
PUNCH AORTIC ROTATE 5MM 8IN (MISCELLANEOUS) IMPLANT
SET CARDIOPLEGIA MPS 5001102 (MISCELLANEOUS) ×1 IMPLANT
SPONGE GAUZE 4X4 12PLY (GAUZE/BANDAGES/DRESSINGS) ×7 IMPLANT
SUT BONE WAX W31G (SUTURE) ×3 IMPLANT
SUT MNCRL AB 4-0 PS2 18 (SUTURE) IMPLANT
SUT PROLENE 3 0 SH DA (SUTURE) ×3 IMPLANT
SUT PROLENE 4 0 RB 1 (SUTURE) ×6
SUT PROLENE 4 0 SH DA (SUTURE) IMPLANT
SUT PROLENE 4-0 RB1 .5 CRCL 36 (SUTURE) IMPLANT
SUT PROLENE 6 0 C 1 30 (SUTURE) ×6 IMPLANT
SUT PROLENE 7 0 BV 1 (SUTURE) ×3 IMPLANT
SUT PROLENE 7 0 BV1 MDA (SUTURE) ×3 IMPLANT
SUT PROLENE 8 0 BV175 6 (SUTURE) ×1 IMPLANT
SUT SILK  1 MH (SUTURE)
SUT SILK 1 MH (SUTURE) IMPLANT
SUT STEEL 6MS V (SUTURE) IMPLANT
SUT STEEL STERNAL CCS#1 18IN (SUTURE) IMPLANT
SUT STEEL SZ 6 DBL 3X14 BALL (SUTURE) ×1 IMPLANT
SUT VIC AB 1 CTX 36 (SUTURE) ×6
SUT VIC AB 1 CTX36XBRD ANBCTR (SUTURE) ×4 IMPLANT
SUT VIC AB 2-0 CT1 27 (SUTURE) ×3
SUT VIC AB 2-0 CT1 TAPERPNT 27 (SUTURE) IMPLANT
SUT VIC AB 2-0 CTX 27 (SUTURE) IMPLANT
SUT VIC AB 3-0 SH 27 (SUTURE)
SUT VIC AB 3-0 SH 27X BRD (SUTURE) IMPLANT
SUT VIC AB 3-0 X1 27 (SUTURE) ×1 IMPLANT
SUT VICRYL 4-0 PS2 18IN ABS (SUTURE) IMPLANT
SUTURE E-PAK OPEN HEART (SUTURE) ×3 IMPLANT
SYSTEM SAHARA CHEST DRAIN ATS (WOUND CARE) ×3 IMPLANT
TAPE CLOTH SURG 4X10 WHT LF (GAUZE/BANDAGES/DRESSINGS) ×2 IMPLANT
TAPE PAPER 2X10 WHT MICROPORE (GAUZE/BANDAGES/DRESSINGS) ×1 IMPLANT
TOWEL OR 17X24 6PK STRL BLUE (TOWEL DISPOSABLE) ×6 IMPLANT
TOWEL OR 17X26 10 PK STRL BLUE (TOWEL DISPOSABLE) ×6 IMPLANT
TRAY FOLEY IC TEMP SENS 14FR (CATHETERS) ×3 IMPLANT
TUBE FEEDING 8FR 16IN STR KANG (MISCELLANEOUS) ×3 IMPLANT
TUBING INSUFFLATION 10FT LAP (TUBING) ×3 IMPLANT
UNDERPAD 30X30 INCONTINENT (UNDERPADS AND DIAPERS) ×3 IMPLANT
WATER STERILE IRR 1000ML POUR (IV SOLUTION) ×6 IMPLANT

## 2012-02-26 NOTE — H&P (View-Only) (Signed)
1 Day Post-Op Procedure(s) (LRB): LEFT HEART CATHETERIZATION WITH CORONARY ANGIOGRAM (N/A) Subjective: No complaints, denies CP, SOB  Objective: Vital signs in last 24 hours: Temp:  [98.1 F (36.7 C)-98.7 F (37.1 C)] 98.1 F (36.7 C) (01/21 1400) Pulse Rate:  [70-94] 79  (01/21 1400) Cardiac Rhythm:  [-] Normal sinus rhythm (01/21 0725) Resp:  [18] 18  (01/21 1400) BP: (97-138)/(62-85) 132/70 mmHg (01/21 1400) SpO2:  [92 %-97 %] 94 % (01/21 1400) Weight:  [157 lb 3.2 oz (71.305 kg)] 157 lb 3.2 oz (71.305 kg) (01/21 0500)  Hemodynamic parameters for last 24 hours:    Intake/Output from previous day: 01/20 0701 - 01/21 0700 In: 1230 [P.O.:240; I.V.:990] Out: -  Intake/Output this shift: Total I/O In: 360 [P.O.:360] Out: -   General appearance: alert and no distress Neurologic: intact Heart: regular rate and rhythm Lungs: clear to auscultation bilaterally  Lab Results:  Basename 02/25/12 0100 02/24/12 0550  WBC 7.0 5.9  HGB 13.8 13.4  HCT 37.8 36.3  PLT 147* 111*   BMET:  Basename 02/25/12 0100  NA 136  K 3.7  CL 98  CO2 26  GLUCOSE 272*  BUN 16  CREATININE 0.56  CALCIUM 9.3    PT/INR: No results found for this basename: LABPROT,INR in the last 72 hours ABG    Component Value Date/Time   HCO3 22.3 04/05/2007 0059   TCO2 24 09/15/2008 1416   ACIDBASEDEF 2.0 04/05/2007 0059   CBG (last 3)   Basename 02/25/12 1650 02/25/12 1212 02/25/12 0722  GLUCAP 190* 207* 188*    Assessment/Plan: S/P Procedure(s) (LRB): LEFT HEART CATHETERIZATION WITH CORONARY ANGIOGRAM (N/A) For CABG in AM Carotids OK Palmar arches obliterate with radial compression PFTs OK All questions answered She is aware of risks and benefits and wishes to proceed   LOS: 3 days    Remington Highbaugh C 02/25/2012   

## 2012-02-26 NOTE — Anesthesia Preprocedure Evaluation (Signed)
Anesthesia Evaluation  Patient identified by MRN, date of birth, ID band Patient awake    Reviewed: Allergy & Precautions, H&P , NPO status , Patient's Chart, lab work & pertinent test results, reviewed documented beta blocker date and time   History of Anesthesia Complications Negative for: history of anesthetic complications  Airway       Dental   Pulmonary          Cardiovascular Exercise Tolerance: Poor + angina at rest + CAD + dysrhythmias Supra Ventricular Tachycardia  S/p multiple ptca stents, ef 60-65%  ekg - nsr , dopplers obliterates c radial compression bilat, nl carotids    Neuro/Psych PSYCHIATRIC DISORDERS Depression    GI/Hepatic   Endo/Other  diabetes, Poorly Controlled, Type 2, Insulin Dependent  Renal/GU      Musculoskeletal   Abdominal   Peds  Hematology   Anesthesia Other Findings   Reproductive/Obstetrics                           Anesthesia Physical Anesthesia Plan  ASA: III  Anesthesia Plan: General   Post-op Pain Management:    Induction: Intravenous  Airway Management Planned: Oral ETT  Additional Equipment: Arterial line, CVP, PA Cath and TEE  Intra-op Plan:   Post-operative Plan: Post-operative intubation/ventilation  Informed Consent: I have reviewed the patients History and Physical, chart, labs and discussed the procedure including the risks, benefits and alternatives for the proposed anesthesia with the patient or authorized representative who has indicated his/her understanding and acceptance.   Dental advisory given  Plan Discussed with: CRNA and Anesthesiologist  Anesthesia Plan Comments: (Low platelets )        Anesthesia Quick Evaluation

## 2012-02-26 NOTE — Transfer of Care (Signed)
Immediate Anesthesia Transfer of Care Note  Patient: [name]  Procedure(s) Performed: Procedure(s) (LRB) with comments: CORONARY ARTERY BYPASS GRAFTING (CABG) (N/A) - CABG x three,  using left internal mammary artery  and left leg greater saphenous vein,  ENDOVEIN HARVEST OF GREATER SAPHENOUS VEIN (Left) - entire leg harvested, exploration of right leg- (not harvested)  Patient Location: SICU  Anesthesia Type:General  Level of Consciousness: sedated and unresponsive  Airway & Oxygen Therapy: Patient remains intubated per anesthesia plan and Patient placed on Ventilator (see vital sign flow sheet for setting)  Post-op Assessment: Report given to PACU RN and Post -op Vital signs reviewed and stable  Post vital signs: Reviewed and stable  Complications: No apparent anesthesia complications

## 2012-02-26 NOTE — Anesthesia Postprocedure Evaluation (Signed)
  Anesthesia Post-op Note  Patient: [name]  Procedure(s) Performed: Procedure(s) (LRB) with comments: CORONARY ARTERY BYPASS GRAFTING (CABG) (N/A) - CABG x three,  using left internal mammary artery  and left leg greater saphenous vein,  ENDOVEIN HARVEST OF GREATER SAPHENOUS VEIN (Left) - entire leg harvested, exploration of right leg- (not harvested)  Patient Location: SICU  Anesthesia Type:General  Level of Consciousness: sedated  Airway and Oxygen Therapy: Patient remains intubated per anesthesia plan  Post-op Pain: none  Post-op Assessment: Patient's Cardiovascular Status Stable and Respiratory Function Stable  Post-op Vital Signs: stable  Complications: No apparent anesthesia complications

## 2012-02-26 NOTE — Interval H&P Note (Signed)
History and Physical Interval Note:  02/26/2012 8:12 AM  [name]  has presented today for surgery, with the diagnosis of CAD  The various methods of treatment have been discussed with the patient and family. After consideration of risks, benefits and other options for treatment, the patient has consented to  Procedure(s) (LRB) with comments: CORONARY ARTERY BYPASS GRAFTING (CABG) (N/A) as a surgical intervention .  The patient's history has been reviewed, patient examined, no change in status, stable for surgery.  I have reviewed the patient's chart and labs.  Questions were answered to the patient's satisfaction.     Lorien Shingler C

## 2012-02-26 NOTE — Procedures (Signed)
Extubation Procedure Note  Patient Details:   Name: [name] DOB: November 06, 1950 MRN: [id number]   Airway Documentation:     Evaluation  O2 sats: stable throughout Complications: No apparent complications Patient did tolerate procedure well. Bilateral Breath Sounds: Clear   Yes  Pt tolerated rapid wean. Achieved VC, -20 NIF and was positive for cuff leak. Pt extubated to 3lpm [place], no strider heard. Pt resting comfortably at this time, vitals are within normal limits. RT will continue to monitor.   Beatris Si 02/26/2012, 4:53 PM

## 2012-02-26 NOTE — Progress Notes (Signed)
UR Completed.  Eunice Winecoff Jane [phone] 01/05/2013  

## 2012-02-26 NOTE — Brief Op Note (Addendum)
                   [address]            [place] [address]          [phone]    02/22/2012 - 02/26/2012  11:56 AM  PATIENT:  [name]  62 y.o. female  PRE-OPERATIVE DIAGNOSIS:  CAD  POST-OPERATIVE DIAGNOSIS:  CAD  PROCEDURE:  Procedure(s): CORONARY ARTERY BYPASS GRAFTING (CABG)X3 LIMA-LAD; SVG-OM; SVG-RCA ENDOVEIN HARVEST OF GREATER SAPHENOUS VEIN LEFT THIGH  SURGEON:  Surgeon(s): Loreli Slot, MD  PHYSICIAN ASSISTANT: WAYNE GOLD PA-C  ANESTHESIA:   general  PATIENT CONDITION:  ICU - intubated and hemodynamically stable.  PRE-OPERATIVE WEIGHT: 71kg   COMPLICATIONS: NO KNOWN  XC= 65 min  CPB= 98 min  Targets relatively small but otherwise good quality Vein right leg small- not harvested/ vein from left leg good quality

## 2012-02-26 NOTE — OR Nursing (Addendum)
First call SICU @1233 , Second call to SICU @1259 

## 2012-02-27 ENCOUNTER — Inpatient Hospital Stay (HOSPITAL_COMMUNITY): Payer: Medicaid Other

## 2012-02-27 DIAGNOSIS — Z951 Presence of aortocoronary bypass graft: Secondary | ICD-10-CM

## 2012-02-27 LAB — MAGNESIUM
Magnesium: 2.1 mg/dL (ref 1.5–2.5)
Magnesium: 1.8 mg/dL (ref 1.5–2.5)

## 2012-02-27 LAB — GLUCOSE, CAPILLARY
Glucose-Capillary: 135 mg/dL — ABNORMAL HIGH (ref 70–99)
Glucose-Capillary: 126 mg/dL — ABNORMAL HIGH (ref 70–99)
Glucose-Capillary: 113 mg/dL — ABNORMAL HIGH (ref 70–99)
Glucose-Capillary: 102 mg/dL — ABNORMAL HIGH (ref 70–99)
Glucose-Capillary: 103 mg/dL — ABNORMAL HIGH (ref 70–99)
Glucose-Capillary: 106 mg/dL — ABNORMAL HIGH (ref 70–99)
Glucose-Capillary: 104 mg/dL — ABNORMAL HIGH (ref 70–99)
Glucose-Capillary: 104 mg/dL — ABNORMAL HIGH (ref 70–99)
Glucose-Capillary: 166 mg/dL — ABNORMAL HIGH (ref 70–99)
Glucose-Capillary: 260 mg/dL — ABNORMAL HIGH (ref 70–99)

## 2012-02-27 LAB — POCT I-STAT, CHEM 8
Calcium, Ion: 1.17 mmol/L (ref 1.13–1.30)
Creatinine, Ser: 0.7 mg/dL (ref 0.50–1.10)
Glucose, Bld: 174 mg/dL — ABNORMAL HIGH (ref 70–99)
Hemoglobin: 8.2 g/dL — ABNORMAL LOW (ref 12.0–15.0)
Potassium: 4.3 mEq/L (ref 3.5–5.1)

## 2012-02-27 LAB — CBC
Hemoglobin: 9.6 g/dL — ABNORMAL LOW (ref 12.0–15.0)
MCHC: 35.8 g/dL (ref 30.0–36.0)
MCHC: 35.7 g/dL (ref 30.0–36.0)
Platelets: 83 10*3/uL — ABNORMAL LOW (ref 150–400)
RBC: 2.98 MIL/uL — ABNORMAL LOW (ref 3.87–5.11)
RDW: 14 % (ref 11.5–15.5)

## 2012-02-27 LAB — CREATININE, SERUM
Creatinine, Ser: 0.45 mg/dL — ABNORMAL LOW (ref 0.50–1.10)
GFR calc non Af Amer: >90 mL/min (ref >90)

## 2012-02-27 LAB — BASIC METABOLIC PANEL
CO2: 23 mEq/L (ref 19–32)
GFR calc non Af Amer: >90 mL/min (ref >90)
Glucose, Bld: 99 mg/dL (ref 70–99)
Potassium: 4.1 mEq/L (ref 3.5–5.1)
Sodium: 136 mEq/L (ref 135–145)

## 2012-02-27 MED ORDER — INSULIN ASPART 100 UNIT/ML ~~LOC~~ SOLN
4.0000 [IU] | Freq: Three times a day (TID) | SUBCUTANEOUS | Status: DC
  Administered 2012-02-27: 4 [IU] via SUBCUTANEOUS

## 2012-02-27 MED ORDER — INSULIN DETEMIR 100 UNIT/ML ~~LOC~~ SOLN
30.0000 [IU] | Freq: Every day | SUBCUTANEOUS | Status: DC
  Administered 2012-02-27: 30 [IU] via SUBCUTANEOUS

## 2012-02-27 MED ORDER — FUROSEMIDE 10 MG/ML IJ SOLN
20.0000 mg | Freq: Two times a day (BID) | INTRAMUSCULAR | Status: DC
  Administered 2012-02-27 – 2012-02-28 (×3): 20 mg via INTRAVENOUS
  Filled 2012-02-27 (×2): qty 2

## 2012-02-27 MED ORDER — TRAMADOL HCL 50 MG PO TABS
50.0000 mg | ORAL_TABLET | Freq: Four times a day (QID) | ORAL | Status: DC | PRN
  Administered 2012-02-27 – 2012-03-03 (×6): 50 mg via ORAL
  Filled 2012-02-27 (×6): qty 1

## 2012-02-27 MED ORDER — INSULIN ASPART 100 UNIT/ML ~~LOC~~ SOLN
0.0000 [IU] | SUBCUTANEOUS | Status: DC
  Administered 2012-02-27: 12 [IU] via SUBCUTANEOUS
  Administered 2012-02-27: 4 [IU] via SUBCUTANEOUS

## 2012-02-27 MED ORDER — SODIUM CHLORIDE 0.9 % IV SOLN
INTRAVENOUS | Status: DC
  Administered 2012-02-27: 3.6 [IU]/h via INTRAVENOUS
  Filled 2012-02-27 (×3): qty 1

## 2012-02-27 MED FILL — Mannitol IV Soln 20%: INTRAVENOUS | Qty: 500 | Status: AC

## 2012-02-27 MED FILL — Sodium Chloride Irrigation Soln 0.9%: Qty: 3000 | Status: AC

## 2012-02-27 MED FILL — Potassium Chloride Inj 2 mEq/ML: INTRAVENOUS | Qty: 40 | Status: AC

## 2012-02-27 MED FILL — Heparin Sodium (Porcine) Inj 1000 Unit/ML: INTRAMUSCULAR | Qty: 30 | Status: AC

## 2012-02-27 MED FILL — Magnesium Sulfate Inj 50%: INTRAMUSCULAR | Qty: 10 | Status: AC

## 2012-02-27 MED FILL — Sodium Bicarbonate IV Soln 8.4%: INTRAVENOUS | Qty: 50 | Status: AC

## 2012-02-27 MED FILL — Electrolyte-R (PH 7.4) Solution: INTRAVENOUS | Qty: 4000 | Status: AC

## 2012-02-27 MED FILL — Sodium Chloride IV Soln 0.9%: INTRAVENOUS | Qty: 1000 | Status: AC

## 2012-02-27 MED FILL — Lidocaine HCl IV Inj 20 MG/ML: INTRAVENOUS | Qty: 5 | Status: AC

## 2012-02-27 MED FILL — Heparin Sodium (Porcine) Inj 1000 Unit/ML: INTRAMUSCULAR | Qty: 20 | Status: AC

## 2012-02-27 NOTE — Progress Notes (Signed)
Inpatient Diabetes Program Recommendations  AACE/ADA: New Consensus Statement on Inpatient Glycemic Control (2013)  Target Ranges:  Prepandial:   less than 140 mg/dL      Peak postprandial:   less than 180 mg/dL (1-2 hours)      Critically ill patients:  140 - 180 mg/dL   Reason for Visit: Note history of diabetes.  Patient is currently on the Glucostabilizer/IV insulin.  Note that prior to surgery patient was on Levemir 100 units at bedtime.  Patient is to transition off insulin drip today at 12 noon.  Please consider increasing Levemir to 50 units daily (this is 1/2 of home dose).   Will follow.

## 2012-02-27 NOTE — Progress Notes (Signed)
1 Day Post-Op Procedure(s) (LRB): CORONARY ARTERY BYPASS GRAFTING (CABG) (N/A) ENDOVEIN HARVEST OF GREATER SAPHENOUS VEIN (Left) Subjective: Stable post cabg Insulin drip continues  Objective: Vital signs in last 24 hours: Temp:  [97.2 F (36.2 C)-100.2 F (37.9 C)] 100 F (37.8 C) (01/23 0700) Pulse Rate:  [79-92] 87  (01/23 0700) Cardiac Rhythm:  [-] Atrial paced (01/22 2000) Resp:  [12-26] 24  (01/23 0700) BP: (91-128)/(51-78) 112/78 mmHg (01/23 0700) SpO2:  [93 %-100 %] 95 % (01/23 0700) Arterial Line BP: (90-115)/(45-62) 109/48 mmHg (01/23 0700) FiO2 (%):  [40 %-50 %] 40 % (01/22 1600) Weight:  [167 lb 12.3 oz (76.1 kg)] 167 lb 12.3 oz (76.1 kg) (01/23 0500)  Hemodynamic parameters for last 24 hours: PAP: (25-39)/(14-27) 29/15 mmHg CO:  [4.1 L/min-6.1 L/min] 6.1 L/min CI:  [2.4 L/min/m2-3.6 L/min/m2] 3.6 L/min/m2  Intake/Output from previous day: 01/22 0701 - 01/23 0700 In: 4824 [P.O.:450; I.V.:3249; Blood:225; IV Piggyback:900] Out: 2175 [Urine:1700; Chest Tube:475] Intake/Output this shift:    Exam Neuro intact Lungs clear  Lab Results:  Basename 02/27/12 0405 02/26/12 2003 02/26/12 1935  WBC 11.1* -- 11.5*  HGB 9.6* 8.5* --  HCT 26.8* 25.0* --  PLT 99* -- 100*   BMET:  Basename 02/27/12 0405 02/26/12 2003 02/25/12 0100  NA 136 141 --  K 4.1 4.5 --  CL 104 108 --  CO2 23 -- 26  GLUCOSE 99 132* --  BUN 9 8 --  CREATININE 0.41* 0.40* --  CALCIUM 8.1* -- 9.3    PT/INR:  Basename 02/26/12 1341  LABPROT 16.0*  INR 1.31   ABG    Component Value Date/Time   PHART 7.352 02/26/2012 1621   HCO3 23.2 02/26/2012 1621   TCO2 22 02/26/2012 2003   ACIDBASEDEF 2.0 02/26/2012 1621   O2SAT 99.0 02/26/2012 1621   CBG (last 3)   Basename 02/26/12 2315 02/26/12 2209 02/26/12 2105  GLUCAP 126* 122* 144*    Assessment/Plan: S/P Procedure(s) (LRB): CORONARY ARTERY BYPASS GRAFTING (CABG) (N/A) ENDOVEIN HARVEST OF GREATER SAPHENOUS VEIN (Left) See progression  orders   LOS: 5 days    VAN TRIGT III,PETER 02/27/2012

## 2012-02-27 NOTE — Progress Notes (Signed)
Subjective:  Tolerated CABG well.  Alert and oriented.  Mild chest soreness  Objective:  Vital Signs in the last 24 hours: BP 103/57  Pulse 87  Temp 99.5 F (37.5 C) (Core (Comment))  Resp 22  Ht 5\' 2"  (1.575 m)  Wt 76.1 kg (167 lb 12.3 oz)  BMI 30.69 kg/m2  SpO2 96%  Physical Exam: Obese white female in no acute distress Lungs:  Clear  Cardiac:  Regular rhythm, normal S1 and S2, no S3  Intake/Output from previous day: 01/22 0701 - 01/23 0700 In: 4824 [P.O.:450; I.V.:3249; Blood:225; IV Piggyback:900] Out: 2175 [Urine:1700; Chest Tube:475]  Weight Filed Weights   02/25/12 0500 02/26/12 0500 02/27/12 0500  Weight: 71.305 kg (157 lb 3.2 oz) 71.623 kg (157 lb 14.4 oz) 76.1 kg (167 lb 12.3 oz)    Lab Results: Basic Metabolic Panel:  Basename 02/27/12 0405 02/26/12 2003 02/25/12 0100  NA 136 141 --  K 4.1 4.5 --  CL 104 108 --  CO2 23 -- 26  GLUCOSE 99 132* --  BUN 9 8 --  CREATININE 0.41* 0.40* --   CBC:  Basename 02/27/12 0405 02/26/12 2003 02/26/12 1935  WBC 11.1* -- 11.5*  NEUTROABS -- -- --  HGB 9.6* 8.5* --  HCT 26.8* 25.0* --  MCV 89.9 -- 88.9  PLT 99* -- 100*   Telemetry: Sinus rhythm  Assessment/Plan: 1. CAD, doing well post CABG 2. Type 2 diabetes 3.  Volume overload  Recommendations:  Doing well post CABG at present tubes to come out.   Darden Palmer  MD [facility]  02/27/2012, 8:58 AM

## 2012-02-27 NOTE — Op Note (Signed)
[name]               ACCOUNT NO.:  [id number]  MEDICAL RECORD NO.:  [id number]  LOCATION:  2314                         FACILITY:  [facility]  PHYSICIAN:  Salvatore Decent. Dorris Fetch, M.D.DATE OF BIRTH:  1950-04-25  DATE OF PROCEDURE:  02/26/2012 DATE OF DISCHARGE:                              OPERATIVE REPORT   PREOPERATIVE DIAGNOSIS:  Severe three-vessel coronary disease with unstable angina.  POSTOPERATIVE DIAGNOSIS:  Severe three-vessel coronary disease with unstable angina.  PROCEDURE:  Median sternotomy, extracorporeal circulation, coronary bypass grafting x3 (left internal mammary artery to LAD, saphenous vein graft to obtuse marginal, saphenous vein graft to distal right coronary), endoscopic vein harvest, left thigh.  SURGEON:  Salvatore Decent. Dorris Fetch, M.D.  ASSISTANT:  Gershon Crane.  ANESTHESIA:  General.  FINDINGS:  Vein from right leg small not harvested, vein from left leg good quality.  Targets relatively small, but otherwise good quality.  CLINICAL NOTE:  [name] is a 62 year old woman with multiple cardiac risk factors and history of medical noncompliance.  She presented with a 2-week history of crescendo unstable angina.  She was admitted and had cardiac catheterization, which revealed severe three-vessel coronary disease with preserved left ventricular function.  She did have a heart murmur but no gradient was found on the aortic valve.  She was advised to undergo coronary artery bypass grafting.  The indications, risks, benefits, and alternatives were discussed in detail with the patient. She understood and accepted the risk and agreed to proceed.  OPERATIVE NOTE:  [name] was brought to the preoperative holding area on February 26, 2012.  There Anesthesia placed a Swan-Ganz catheter and arterial blood pressure monitoring line.  Intravenous antibiotics were administered.  She was taken to the operating room, anesthetized, and intubated.  A Foley  catheter was placed.  The chest, abdomen, and legs were prepped and draped in usual sterile fashion.  An incision made in the medial aspect of the right leg just below the knee.  Exploration revealed multiple small vein branches but no dominant saphenous vein. Therefore, an incision made in the medial aspect of the left leg at the level of the knee.  There the greater saphenous vein was identified and was harvested endoscopically from the groin to the upper calf.  It was a good quality vessel.  Simultaneously with the vein harvest, a median sternotomy was performed and the left internal mammary artery was harvested using standard technique.  It was a 1.5 mm vessel with good flow.  It was harvested in the standard fashion.  Heparin 2000 units was administered prior to dividing the distal end of the left mammary artery.  The remainder of full heparin dose was given after opening the pericardium.  The pericardium was opened.  The ascending aorta was inspected.  There was no evidence of atherosclerotic disease.  After the patient had been anticoagulated, the level of anticoagulation was confirmed with ACT measurement.  The aorta was cannulated via concentric 2-0 Ethibond pledgeted pursestring sutures.  A dual-stage venous cannula was placed via pursestring suture in the right atrial appendage.  Cardiopulmonary bypass was instituted and the patient was cooled to 34 degrees Celsius. Flows were maintained per protocol during the  bypass.  The coronary arteries were inspected and anastomotic sites were chosen.  The conduits were inspected and cut to length.  A foam pad was placed in the pericardium to insulate the heart and protect the left phrenic nerve.  A temperature probe was placed in myocardial septum and a cardioplegic cannula placed in the ascending aorta.  The aorta was crossclamped.  The left ventricle was emptied via aortic root vent.  Cardiac arrest then was achieved with a  combination of cold, antegrade, blood cardioplegia, and topical iced saline.  The patient had a rapid diastolic arrest. The heart was relatively slow to cool but eventually had septal cooling down to 10 degrees Celsius.  The following distal anastomoses were performed.  First, a reversed saphenous vein graft was placed end-to-side to the obtuse marginal.  This was the dominant lateral branch.  It was the vessel that had previously been stented.  It was a 1.5 mm good quality target site.  The vein graft was good quality.  It was anastomosed end-to-side with a running 7-0 Prolene suture.  All anastomoses were probed proximally and distally at their completion prior to tying the sutures.  Cardioplegia was administered at the completion of each vein graft to assess flow and hemostasis.  Next, a reversed saphenous vein graft was placed end-to-side to the distal right coronary.  This was grafted just across the acute margin of the heart just proximal to the bifurcation.  A 1.5-mm probe did pass into both branches of the bifurcation.  The vessel was a good quality target at the site of the anastomosis.  The vein was anastomosed end-to-side with a running 7-0 Prolene suture.  Again, cardioplegia was administered down the graft at its completion.  Additional cardioplegia was also administered in the aortic root.  The left internal mammary artery then was brought through a window in the pericardium and the distal end was beveled.  It was then anastomosed end- to-side to the distal LAD.  The mammary was a 1.5 mm vessel.  The LAD likewise accepted a 1.5 mm probe.  It did have some mild plaquing but no significant atherosclerotic disease at the site of the anastomosis.  The mammary to LAD anastomosis was performed with a running 8-0 Prolene suture.  At the completion of the anastomosis, the bulldog clamp was briefly removed.  Immediate and rapid septal rewarming was noted.  The bulldog clamp was  replaced and the mammary pedicle was tacked to the epicardial surface of the heart with 6-0 Prolene sutures.  Additional cardioplegia was administered.  The vein grafts were cut to length.  The cardioplegic cannula was removed from the ascending aorta. The proximal vein graft anastomoses were performed to 4.0 mm punch aortotomies with running 6-0 Prolene sutures.  At the completion of the final proximal anastomosis, the patient was placed in Trendelenburg position.  Lidocaine was administered.  The bulldog clamp was again removed from the left mammary artery and septal rewarming was again noted.  The aortic root was deaired.  The aortic crossclamp was removed. Total crossclamp time was 65 minutes.  The patient resumes sinus rhythm spontaneously and did not require defibrillation.  While rewarming was completed, all proximal and distal anastomoses were inspected for hemostasis.  Epicardial pacing wires were placed on the right ventricle and right atrium.  When the patient had rewarmed to a core temperature of 37 degrees Celsius, she was weaned from cardiopulmonary bypass on the first attempt.  Total bypass time was 98 minutes.  Initial  cardiac index was greater than 2 liters/minute/meter squared.  She remained hemodynamically stable throughout the post bypass period.  A test dose of protamine was administered was well tolerated.  The atrial and aortic cannulae were removed.  Remainder of the protamine was administered without incident.  The chest was irrigated with warm saline.  Hemostasis was achieved.  A left pleural and single mediastinal chest tubes were placed a separate subcostal incisions.  The pericardium was reapproximated with interrupted 3-0 silk sutures.  It came together easily without tension.  The sternum was closed with a combination of single and double heavy gauge stainless steel wires.  The pectoralis fascia, subcutaneous tissue, and skin were closed in  standard fashion.  All sponge, needle, and instrument counts were correct at the end of the procedure.  There were no intraoperative complications.  The patient was taken from the operating room to the surgical intensive care unit in good condition.     Salvatore Decent Dorris Fetch, M.D.     SCH/MEDQ  D:  02/26/2012  T:  02/27/2012  Job:  [id number]

## 2012-02-27 NOTE — Progress Notes (Signed)
T[facility] BRIEF SICU PROGRESS NOTE  1 Day Post-Op  S/P Procedure(s) (LRB): CORONARY ARTERY BYPASS GRAFTING (CABG) (N/A) ENDOVEIN HARVEST OF GREATER SAPHENOUS VEIN (Left)   Stable day NSR w/ stable BP O2 sats 95% on 3 L/min Diuresing fairly well after lasix  Plan: Continue current plan  Yannely Kintzel H 02/27/2012 3:00 PM

## 2012-02-28 ENCOUNTER — Inpatient Hospital Stay (HOSPITAL_COMMUNITY): Payer: Medicaid Other

## 2012-02-28 ENCOUNTER — Encounter (HOSPITAL_COMMUNITY): Payer: Self-pay | Admitting: Thoracic Surgery (Cardiothoracic Vascular Surgery)

## 2012-02-28 LAB — TYPE AND SCREEN
ABO/RH(D): A POS
Antibody Screen: NEGATIVE
Unit division: 0
Unit division: 0
Unit division: 0

## 2012-02-28 LAB — CBC
HCT: 24.4 % — ABNORMAL LOW (ref 36.0–46.0)
Hemoglobin: 8.5 g/dL — ABNORMAL LOW (ref 12.0–15.0)
MCH: 32.3 pg (ref 26.0–34.0)
MCHC: 34.8 g/dL (ref 30.0–36.0)
MCV: 92.8 fL (ref 78.0–100.0)
Platelets: 75 10*3/uL — ABNORMAL LOW (ref 150–400)
RBC: 2.63 MIL/uL — ABNORMAL LOW (ref 3.87–5.11)
RDW: 14 % (ref 11.5–15.5)
WBC: 6.9 10*3/uL (ref 4.0–10.5)

## 2012-02-28 LAB — GLUCOSE, CAPILLARY
Glucose-Capillary: 110 mg/dL — ABNORMAL HIGH (ref 70–99)
Glucose-Capillary: 125 mg/dL — ABNORMAL HIGH (ref 70–99)
Glucose-Capillary: 149 mg/dL — ABNORMAL HIGH (ref 70–99)
Glucose-Capillary: 158 mg/dL — ABNORMAL HIGH (ref 70–99)
Glucose-Capillary: 237 mg/dL — ABNORMAL HIGH (ref 70–99)
Glucose-Capillary: 239 mg/dL — ABNORMAL HIGH (ref 70–99)

## 2012-02-28 LAB — BASIC METABOLIC PANEL
BUN: 8 mg/dL (ref 6–23)
CO2: 28 mEq/L (ref 19–32)
Calcium: 8.3 mg/dL — ABNORMAL LOW (ref 8.4–10.5)
Chloride: 103 mEq/L (ref 96–112)
Creatinine, Ser: 0.47 mg/dL — ABNORMAL LOW (ref 0.50–1.10)
GFR calc Af Amer: >90 mL/min (ref >90)
GFR calc non Af Amer: >90 mL/min (ref >90)
Glucose, Bld: 106 mg/dL — ABNORMAL HIGH (ref 70–99)
Potassium: 3.8 mEq/L (ref 3.5–5.1)
Sodium: 137 mEq/L (ref 135–145)

## 2012-02-28 MED ORDER — SODIUM CHLORIDE 0.9 % IJ SOLN: 3.0000 mL | INTRAMUSCULAR | Status: DC | PRN

## 2012-02-28 MED ORDER — METOPROLOL TARTRATE 25 MG PO TABS
25.0000 mg | ORAL_TABLET | Freq: Two times a day (BID) | ORAL | Status: DC
  Administered 2012-02-28: 25 mg via ORAL
  Administered 2012-02-28: 12.5 mg via ORAL
  Administered 2012-02-29 – 2012-03-03 (×7): 25 mg via ORAL
  Filled 2012-02-28 (×10): qty 1

## 2012-02-28 MED ORDER — INSULIN DETEMIR 100 UNIT/ML ~~LOC~~ SOLN
50.0000 [IU] | Freq: Every day | SUBCUTANEOUS | Status: DC
  Administered 2012-02-28: 50 [IU] via SUBCUTANEOUS

## 2012-02-28 MED ORDER — METOPROLOL TARTRATE 25 MG/10 ML ORAL SUSPENSION
25.0000 mg | Freq: Two times a day (BID) | ORAL | Status: DC
  Filled 2012-02-28 (×3): qty 10

## 2012-02-28 MED ORDER — INSULIN ASPART 100 UNIT/ML ~~LOC~~ SOLN: 10.0000 [IU] | Freq: Three times a day (TID) | SUBCUTANEOUS | Status: DC

## 2012-02-28 MED ORDER — INSULIN ASPART 100 UNIT/ML ~~LOC~~ SOLN
0.0000 [IU] | SUBCUTANEOUS | Status: DC
  Administered 2012-02-28: 4 [IU] via SUBCUTANEOUS
  Administered 2012-02-28: 8 [IU] via SUBCUTANEOUS
  Administered 2012-02-29 (×2): 4 [IU] via SUBCUTANEOUS
  Administered 2012-02-29: 2 [IU] via SUBCUTANEOUS
  Administered 2012-02-29: 16 [IU] via SUBCUTANEOUS
  Administered 2012-02-29: 12 [IU] via SUBCUTANEOUS
  Administered 2012-02-29: 8 [IU] via SUBCUTANEOUS
  Administered 2012-03-01: 4 [IU] via SUBCUTANEOUS
  Administered 2012-03-01: 12 [IU] via SUBCUTANEOUS
  Administered 2012-03-01 (×2): 8 [IU] via SUBCUTANEOUS
  Administered 2012-03-01: 12 [IU] via SUBCUTANEOUS
  Administered 2012-03-01 – 2012-03-02 (×2): 2 [IU] via SUBCUTANEOUS
  Administered 2012-03-02: 12 [IU] via SUBCUTANEOUS
  Administered 2012-03-02 (×2): 2 [IU] via SUBCUTANEOUS
  Administered 2012-03-02 (×2): 4 [IU] via SUBCUTANEOUS
  Administered 2012-03-03: 2 [IU] via SUBCUTANEOUS
  Administered 2012-03-03: 8 [IU] via SUBCUTANEOUS
  Administered 2012-03-03 – 2012-03-04 (×5): 4 [IU] via SUBCUTANEOUS
  Administered 2012-03-04 (×2): 2 [IU] via SUBCUTANEOUS

## 2012-02-28 MED ORDER — METOPROLOL TARTRATE 25 MG/10 ML ORAL SUSPENSION: 25.0000 mg | Freq: Two times a day (BID) | ORAL | Status: DC

## 2012-02-28 MED ORDER — MOVING RIGHT ALONG BOOK
Freq: Once | Status: AC
  Administered 2012-02-28: 16:00:00
  Filled 2012-02-28: qty 1

## 2012-02-28 MED ORDER — FUROSEMIDE 40 MG PO TABS
40.0000 mg | ORAL_TABLET | Freq: Two times a day (BID) | ORAL | Status: DC
  Administered 2012-02-28: 40 mg via ORAL
  Filled 2012-02-28 (×4): qty 1

## 2012-02-28 MED ORDER — METOPROLOL TARTRATE 12.5 MG HALF TABLET
12.5000 mg | ORAL_TABLET | Freq: Two times a day (BID) | ORAL | Status: DC
  Administered 2012-02-28: 12.5 mg via ORAL
  Filled 2012-02-28 (×3): qty 1

## 2012-02-28 MED ORDER — AMIODARONE HCL 200 MG PO TABS
200.0000 mg | ORAL_TABLET | Freq: Two times a day (BID) | ORAL | Status: DC
  Administered 2012-02-28 – 2012-03-04 (×10): 200 mg via ORAL
  Filled 2012-02-28 (×11): qty 1

## 2012-02-28 MED ORDER — FUROSEMIDE 20 MG PO TABS
20.0000 mg | ORAL_TABLET | Freq: Two times a day (BID) | ORAL | Status: DC
  Filled 2012-02-28 (×2): qty 1

## 2012-02-28 MED ORDER — SODIUM CHLORIDE 0.9 % IJ SOLN
3.0000 mL | Freq: Two times a day (BID) | INTRAMUSCULAR | Status: DC
  Administered 2012-02-28 – 2012-03-03 (×7): 3 mL via INTRAVENOUS

## 2012-02-28 MED ORDER — SODIUM CHLORIDE 0.9 % IV SOLN: 250.0000 mL | INTRAVENOUS | Status: DC | PRN

## 2012-02-28 MED ORDER — GUAIFENESIN ER 600 MG PO TB12
600.0000 mg | ORAL_TABLET | Freq: Two times a day (BID) | ORAL | Status: DC | PRN
  Filled 2012-02-28: qty 1

## 2012-02-28 NOTE — Plan of Care (Signed)
Problem: Phase III Progression Outcomes Goal: Transfer to PCTU/Telemetry POD Outcome: Completed/Met Date Met:  02/28/12 1630 Goal: Time patient transferred to PCTU/Telemetry POD Outcome: Completed/Met Date Met:  02/28/12 1630

## 2012-02-28 NOTE — Progress Notes (Signed)
Subjective:   Alert and oriented.  Mild chest soreness.  Not SOB.  Has some abrupt atrial tachycardia last night treated with IV metoprolol and now overdrive pacing.  Currently AV pacing at 90.  Objective:  Vital Signs in the last 24 hours: BP 115/58  Pulse 91  Temp 98.1 F (36.7 C) (Oral)  Resp 23  Ht 5\' 2"  (1.575 m)  Wt 75.6 kg (166 lb 10.7 oz)  BMI 30.48 kg/m2  SpO2 98%  Physical Exam: Obese white female in no acute distress Lungs:  Clear  Cardiac:  Regular rhythm, normal S1 and S2, no S3  Intake/Output from previous day: 01/23 0701 - 01/24 0700 In: 1498.1 [P.O.:840; I.V.:554.1; IV Piggyback:104] Out: 2330 [Urine:2280; Chest Tube:50]  Weight Filed Weights   02/26/12 0500 02/27/12 0500 02/28/12 0500  Weight: 71.623 kg (157 lb 14.4 oz) 76.1 kg (167 lb 12.3 oz) 75.6 kg (166 lb 10.7 oz)    Lab Results: Basic Metabolic Panel:  Basename 02/28/12 0410 02/27/12 1701 02/27/12 0405  NA 137 135 --  K 3.8 4.3 --  CL 103 100 --  CO2 28 -- 23  GLUCOSE 106* 174* --  BUN 8 7 --  CREATININE 0.47* 0.70 --   CBC:  Basename 02/28/12 0410 02/27/12 1701 02/27/12 1700  WBC 6.9 -- 9.6  NEUTROABS -- -- --  HGB 8.5* 8.2* --  HCT 24.4* 24.0* --  MCV 92.8 -- 92.3  PLT 75* -- 83*   Telemetry: Sinus rhythm  Assessment/Plan: 1. CAD, doing well post CABG 2. Type 2 diabetes 3.  Volume overload better 4. Atrial tachycardia  Recommendations:  Increase metoprolol and try to wean from pacing.  May want to change to AAI.   Darden Palmer  MD [facility]  02/28/2012, 8:30 AM

## 2012-02-28 NOTE — Progress Notes (Signed)
Report called to RN and met at bedside.  Patient transferred to 2015 and attached to tele monitors without complications.

## 2012-02-28 NOTE — Progress Notes (Addendum)
2 Days Post-Op Procedure(s) (LRB): CORONARY ARTERY BYPASS GRAFTING (CABG) (N/A) ENDOVEIN HARVEST OF GREATER SAPHENOUS VEIN (Left) Subjective:  [name] has no specific complaints this morning.  She was re-started on her insulin drip this morning with sugar of 280  Objective: Vital signs in last 24 hours: Temp:  [97.6 F (36.4 C)-98.5 F (36.9 C)] 98.1 F (36.7 C) (01/24 0754) Pulse Rate:  [79-95] 95  (01/24 0904) Cardiac Rhythm:  [-] A-V Sequential paced;Normal sinus rhythm (01/24 0805) Resp:  [14-27] 17  (01/24 0800) BP: (78-134)/(43-76) 134/63 mmHg (01/24 0904) SpO2:  [93 %-98 %] 98 % (01/24 0800) Weight:  [166 lb 10.7 oz (75.6 kg)] 166 lb 10.7 oz (75.6 kg) (01/24 0500)   Intake/Output from previous day: 01/23 0701 - 01/24 0700 In: 1498.1 [P.O.:840; I.V.:554.1; IV Piggyback:104] Out: 2330 [Urine:2280; Chest Tube:50] Intake/Output this shift: Total I/O In: 28.6 [I.V.:28.6] Out: 100 [Urine:100]  General appearance: alert, cooperative and no distress Heart: regular rate and rhythm Lungs: clear to auscultation bilaterally Abdomen: soft, non-tender; bowel sounds normal; no masses,  no organomegaly Extremities: edema trace Wound: clean and dry  Lab Results:  Basename 02/28/12 0410 02/27/12 1701 02/27/12 1700  WBC 6.9 -- 9.6  HGB 8.5* 8.2* --  HCT 24.4* 24.0* --  PLT 75* -- 83*   BMET:  Basename 02/28/12 0410 02/27/12 1701 02/27/12 0405  NA 137 135 --  K 3.8 4.3 --  CL 103 100 --  CO2 28 -- 23  GLUCOSE 106* 174* --  BUN 8 7 --  CREATININE 0.47* 0.70 --  CALCIUM 8.3* -- 8.1*    PT/INR:  Basename 02/26/12 1341  LABPROT 16.0*  INR 1.31   ABG    Component Value Date/Time   PHART 7.352 02/26/2012 1621   HCO3 23.2 02/26/2012 1621   TCO2 24 02/27/2012 1701   ACIDBASEDEF 2.0 02/26/2012 1621   O2SAT 99.0 02/26/2012 1621   CBG (last 3)   Basename 02/28/12 0751 02/28/12 0456 02/28/12 0207  GLUCAP 125* 110* 139*    Assessment/Plan: S/P Procedure(s)  (LRB): CORONARY ARTERY BYPASS GRAFTING (CABG) (N/A) ENDOVEIN HARVEST OF GREATER SAPHENOUS VEIN (Left)  1. CV- NSR, tachy, pressure mildly elevated will increase Lopressor to 25 mg BID 2. Pulm- small bilateral effusions, + atelectasis, wean oxygen as tolerated 3. Renal- creatinine, lytes okay 4. Volume Overload- weight is up 7lbs, on IV Lasix, will convert to PO tomorrow 5. DM- CBGs uncontrolled, on insulin drip, will start Levemir at 50 units daily which is half of her home dose and meal coverage 6. Dispo- patient doing well, need better blood sugar control, transfer to step down?    LOS: 6 days    [name] 02/28/2012   Walked 300 feet Levimir increased due to inc CBG CXR with improved atelectasis Will put tx orders to stepdown and start amiodarone po for recurrent brief episodes of RAF

## 2012-02-29 ENCOUNTER — Inpatient Hospital Stay (HOSPITAL_COMMUNITY): Payer: Medicaid Other

## 2012-02-29 LAB — BASIC METABOLIC PANEL
BUN: 15 mg/dL (ref 6–23)
CO2: 27 mEq/L (ref 19–32)
Calcium: 8.6 mg/dL (ref 8.4–10.5)
Chloride: 99 mEq/L (ref 96–112)
Creatinine, Ser: 0.43 mg/dL — ABNORMAL LOW (ref 0.50–1.10)
GFR calc Af Amer: >90 mL/min (ref >90)
GFR calc non Af Amer: >90 mL/min (ref >90)
Glucose, Bld: 196 mg/dL — ABNORMAL HIGH (ref 70–99)
Potassium: 3.7 mEq/L (ref 3.5–5.1)
Sodium: 136 mEq/L (ref 135–145)

## 2012-02-29 LAB — GLUCOSE, CAPILLARY
Glucose-Capillary: 106 mg/dL — ABNORMAL HIGH (ref 70–99)
Glucose-Capillary: 106 mg/dL — ABNORMAL HIGH (ref 70–99)
Glucose-Capillary: 128 mg/dL — ABNORMAL HIGH (ref 70–99)
Glucose-Capillary: 162 mg/dL — ABNORMAL HIGH (ref 70–99)
Glucose-Capillary: 166 mg/dL — ABNORMAL HIGH (ref 70–99)
Glucose-Capillary: 173 mg/dL — ABNORMAL HIGH (ref 70–99)
Glucose-Capillary: 82 mg/dL (ref 70–99)

## 2012-02-29 LAB — CBC
HCT: 25.5 % — ABNORMAL LOW (ref 36.0–46.0)
Hemoglobin: 8.8 g/dL — ABNORMAL LOW (ref 12.0–15.0)
MCH: 32.5 pg (ref 26.0–34.0)
MCHC: 34.5 g/dL (ref 30.0–36.0)
MCV: 94.1 fL (ref 78.0–100.0)
Platelets: 105 10*3/uL — ABNORMAL LOW (ref 150–400)
RBC: 2.71 MIL/uL — ABNORMAL LOW (ref 3.87–5.11)
RDW: 14 % (ref 11.5–15.5)
WBC: 7.4 10*3/uL (ref 4.0–10.5)

## 2012-02-29 MED ORDER — ATORVASTATIN CALCIUM 20 MG PO TABS
20.0000 mg | ORAL_TABLET | Freq: Every day | ORAL | Status: DC
  Administered 2012-02-29 – 2012-03-03 (×4): 20 mg via ORAL
  Filled 2012-02-29 (×5): qty 1

## 2012-02-29 MED ORDER — FUROSEMIDE 40 MG PO TABS
40.0000 mg | ORAL_TABLET | Freq: Every day | ORAL | Status: DC
  Filled 2012-02-29: qty 1

## 2012-02-29 MED ORDER — POTASSIUM CHLORIDE CRYS ER 20 MEQ PO TBCR
30.0000 meq | EXTENDED_RELEASE_TABLET | Freq: Once | ORAL | Status: AC
  Administered 2012-02-29: 30 meq via ORAL
  Filled 2012-02-29: qty 1

## 2012-02-29 MED ORDER — INSULIN ASPART 100 UNIT/ML ~~LOC~~ SOLN
3.0000 [IU] | Freq: Three times a day (TID) | SUBCUTANEOUS | Status: DC
  Administered 2012-02-29 – 2012-03-01 (×3): 3 [IU] via SUBCUTANEOUS

## 2012-02-29 MED ORDER — INSULIN DETEMIR 100 UNIT/ML ~~LOC~~ SOLN
20.0000 [IU] | Freq: Every day | SUBCUTANEOUS | Status: DC
  Administered 2012-02-29: 20 [IU] via SUBCUTANEOUS

## 2012-02-29 MED ORDER — POLYETHYLENE GLYCOL 3350 17 G PO PACK
17.0000 g | PACK | Freq: Once | ORAL | Status: AC
  Administered 2012-02-29: 17 g via ORAL
  Filled 2012-02-29: qty 1

## 2012-02-29 MED ORDER — POTASSIUM CHLORIDE CRYS ER 20 MEQ PO TBCR
20.0000 meq | EXTENDED_RELEASE_TABLET | Freq: Every day | ORAL | Status: DC
  Administered 2012-02-29 – 2012-03-04 (×5): 20 meq via ORAL
  Filled 2012-02-29 (×5): qty 1

## 2012-02-29 MED ORDER — METFORMIN HCL 500 MG PO TABS
1000.0000 mg | ORAL_TABLET | Freq: Two times a day (BID) | ORAL | Status: DC
  Administered 2012-02-29 – 2012-03-04 (×8): 1000 mg via ORAL
  Filled 2012-02-29 (×10): qty 2

## 2012-02-29 NOTE — Progress Notes (Signed)
CARDIAC REHAB PHASE I   PRE:  Rate/Rhythm: AV Paced 91  BP:    Sitting: 115/54    SaO2: 99% 2L/min  MODE:  Ambulation: 200 ft   POST:  Rate/Rhythem: AV paced  BP:    Sitting: 114/70    SaO2:96% on 2L/min 1255-1315 Patient ambulated without complaints in hallway. Tolerated walk without difficulty. Patient assisted pt back to recliner with call bell within reach.  Harlon Flor, Arta Bruce

## 2012-02-29 NOTE — Progress Notes (Signed)
Patient ambulated 150 feet with RW on 2L O2. Patient tolerated well. Patient returned to room and wanted to go to bed. Will continue to monitor.

## 2012-02-29 NOTE — Progress Notes (Addendum)
                   [address]            [place] [address]          [phone]      3 Days Post-Op Procedure(s) (LRB): CORONARY ARTERY BYPASS GRAFTING (CABG) (N/A) ENDOVEIN HARVEST OF GREATER SAPHENOUS VEIN (Left)  Subjective: Patient feels tired. She is passing flatus but no bowel movement yet.  Objective: Vital signs in last 24 hours: Temp:  [97.7 F (36.5 C)-98.6 F (37 C)] 98.2 F (36.8 C) (01/25 0435) Pulse Rate:  [89-95] 91  (01/25 0435) Cardiac Rhythm:  [-] A-V Sequential paced (01/24 1945) Resp:  [19-25] 20  (01/25 0435) BP: (84-134)/(51-67) 117/60 mmHg (01/25 0435) SpO2:  [96 %-100 %] 96 % (01/25 0435) Weight:  [73.664 kg (162 lb 6.4 oz)] 73.664 kg (162 lb 6.4 oz) (01/25 0435)  Pre op weight 71 kg Current Weight  02/29/12 73.664 kg (162 lb 6.4 oz)      Intake/Output from previous day: 01/24 0701 - 01/25 0700 In: 268.4 [I.V.:218.4; IV Piggyback:50] Out: 1995 [Urine:1995]   Physical Exam:  Cardiovascular: RRR, no murmurs, gallops, or rubs. Pulmonary: Clear to auscultation bilaterally; no rales, wheezes, or rhonchi. Abdomen: Soft, non tender, bowel sounds present. Extremities: Mild bilateral lower extremity edema. Wounds: Clean and dry.  No erythema or signs of infection.  Lab Results: CBC: Basename 02/29/12 0645 02/28/12 0410  WBC 7.4 6.9  HGB 8.8* 8.5*  HCT 25.5* 24.4*  PLT 105* 75*   BMET:  Basename 02/29/12 0645 02/28/12 0410  NA 136 137  K 3.7 3.8  CL 99 103  CO2 27 28  GLUCOSE 196* 106*  BUN 15 8  CREATININE 0.43* 0.47*  CALCIUM 8.6 8.3*    PT/INR:  Lab Results  Component Value Date   INR 1.31 02/26/2012   INR 1.00 02/22/2012   INR 0.9 09/28/2008   ABG:  INR: Will add last result for INR, ABG once components are confirmed Will add last 4 CBG results once components are confirmed  Assessment/Plan:  1. CV - AV paced. On Lopressor 25 bid, Amiodarone 200 bid 2.  Pulmonary - CXR this am shows low lung volumes,  small bilateral pleural effusions, no pneumothorax, and decreasing left base atelectasis.Encourage incentive spirometer 3. Volume Overload - On lasix 40 bid. Will decrease to daily 4.  Acute blood loss anemia - H and H 8.8 and 25.5. 5.Thrombocytopenia-platelets up to 105,000 6.Supplement potassium 7.DM-CBGs 237/162/128. Pre op HGA1C 8.5. On Levemir and Novolog. Was on Metformin pre op. Will restart and decrease Insulin 8.LOC for constipation  ZIMMERMAN,DONIELLE MPA-C 02/29/2012,8:27 AM   patient examined and medical record reviewed,agree with above note. VAN TRIGT III,Marly Schuld 02/29/2012

## 2012-02-29 NOTE — Evaluation (Signed)
Physical Therapy Evaluation Patient Details Name: [name] MRN: [id number] DOB: [birth year] Today's Date: 02/29/2012 Time: 4098-1191 PT Time Calculation (min): 19 min  PT Assessment / Plan / Recommendation Clinical Impression  Patient is a 62 yo female s/p CABG x3.  Patient required assist for mobility/gait.  Will benefit from acute PT to maximize independence/safety prior to discharge home.  Patient has 24 hour assist at discharge.  Recommend HHPT for continued therapy at discharge.    PT Assessment  Patient needs continued PT services    Follow Up Recommendations  Home health PT;Supervision/Assistance - 24 hour    Does the patient have the potential to tolerate intense rehabilitation      Barriers to Discharge None Sister will be staying with patient at patient's home.    Equipment Recommendations  None recommended by PT    Recommendations for Other Services     Frequency Min 3X/week    Precautions / Restrictions Precautions Precautions: Sternal Precaution Comments: Reviewed sternal precautions with patient. Restrictions Weight Bearing Restrictions: No   Pertinent Vitals/Pain       Mobility  Bed Mobility Bed Mobility: Supine to Sit;Sitting - Scoot to Edge of Bed;Sit to Supine Supine to Sit: 3: Mod assist;HOB elevated Sitting - Scoot to Edge of Bed: 4: Min guard Sit to Supine: 4: Min assist;HOB elevated Details for Bed Mobility Assistance: Verbal cues for technique to maintain precautions.  Assist to raise trunk off bed to sit.  Assist to raise LE's onto bed to return to supine. Transfers Transfers: Sit to Stand;Stand to Sit Sit to Stand: 4: Min assist;Without upper extremity assist;From bed Stand to Sit: 4: Min guard;Without upper extremity assist;To bed Details for Transfer Assistance: Verbal cues for technique maintaining precautions.  Min assist to rise to sitting.  Balance good. Ambulation/Gait Ambulation/Gait Assistance: 4: Min guard Ambulation Distance  (Feet): 180 Feet Assistive device: Rolling walker Ambulation/Gait Assistance Details: Verbal cues for minimal weight on UE's - using RW for balance only.  Patient with steady gait with RW.  Cues to maintain a safe speed. Gait Pattern: Step-through pattern;Decreased stride length Gait velocity: Slow gait speed           PT Diagnosis: Difficulty walking;Abnormality of gait;Generalized weakness;Acute pain  PT Problem List: Decreased strength;Decreased activity tolerance;Decreased balance;Decreased mobility;Decreased knowledge of use of DME;Decreased knowledge of precautions;Cardiopulmonary status limiting activity;Pain;Obesity PT Treatment Interventions: DME instruction;Gait training;Functional mobility training;Patient/family education   PT Goals Acute Rehab PT Goals PT Goal Formulation: With patient Time For Goal Achievement: 03/07/12 Potential to Achieve Goals: Good Pt will go Supine/Side to Sit: with supervision;with HOB 0 degrees PT Goal: Supine/Side to Sit - Progress: Goal set today Pt will go Sit to Supine/Side: with supervision;with HOB 0 degrees PT Goal: Sit to Supine/Side - Progress: Goal set today Pt will go Sit to Stand: with supervision;without upper extremity assist PT Goal: Sit to Stand - Progress: Goal set today Pt will Ambulate: >150 feet;with supervision;with least restrictive assistive device PT Goal: Ambulate - Progress: Goal set today Additional Goals Additional Goal #1: Patient will complete all above goals maintaining sternal precautions. PT Goal: Additional Goal #1 - Progress: Goal set today  Visit Information  Last PT Received On: 02/29/12 Assistance Needed: +1    Subjective Data  Subjective: Talkative. "We can go for a walk"  "I'm used to being the strong one" Patient Stated Goal: To get stronger and go home.   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)  Available Help at Discharge:  Family;Available 24 hours/day (Sister is coming to stay with patient at discharge) Type of Home: House Home Access: Ramped entrance Home Layout: One level Bathroom Shower/Tub: Health visitor: Handicapped height Bathroom Accessibility: Yes How Accessible: Accessible via walker Home Adaptive Equipment: Walker - rolling;Shower chair with back Prior Function Level of Independence: Independent Able to Take Stairs?: Yes Driving: Yes Vocation: Full time employment Teacher, 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) Communication Communication: No difficulties    Cognition  Overall Cognitive Status: Appears within functional limits for tasks assessed/performed Arousal/Alertness: Awake/alert Orientation Level: Oriented X4 / Intact Behavior During Session: [facility] for tasks performed    Extremity/Trunk Assessment Right Upper Extremity Assessment RUE ROM/Strength/Tone: WFL for tasks assessed RUE Sensation: WFL - Light Touch Left Upper Extremity Assessment LUE ROM/Strength/Tone: WFL for tasks assessed LUE Sensation: WFL - Light Touch Right Lower Extremity Assessment RLE ROM/Strength/Tone: WFL for tasks assessed RLE Sensation: WFL - Light Touch RLE Coordination: WFL - gross motor Left Lower Extremity Assessment LLE ROM/Strength/Tone: WFL for tasks assessed LLE Sensation: WFL - Light Touch LLE Coordination: WFL - gross motor   Balance    End of Session PT - End of Session Equipment Utilized During Treatment: Gait belt Activity Tolerance: Patient limited by fatigue Patient left: in bed;with call bell/phone within reach Nurse Communication: Mobility status  GP   Vena Austria 02/29/2012, 10:19 AM [name]. Renaldo Fiddler, [facility] Pager [phone]

## 2012-02-29 NOTE — Progress Notes (Signed)
Subjective:  Feels better. No CP, no SOB.   Objective:  Vital Signs in the last 24 hours: Temp:  [97.7 F (36.5 C)-98.6 F (37 C)] 98.2 F (36.8 C) (01/25 0435) Pulse Rate:  [89-91] 91  (01/25 0435) Resp:  [19-25] 20  (01/25 0435) BP: (84-117)/(51-65) 117/60 mmHg (01/25 0435) SpO2:  [96 %-100 %] 96 % (01/25 0435) Weight:  [73.664 kg (162 lb 6.4 oz)] 73.664 kg (162 lb 6.4 oz) (01/25 0435)  Intake/Output from previous day: 01/24 0701 - 01/25 0700 In: 748.4 [P.O.:480; I.V.:218.4; IV Piggyback:50] Out: 2145 [Urine:2145]   Physical Exam: General: Well developed, well nourished, in no acute distress. Head:  Normocephalic and atraumatic. Lungs: Clear to auscultation and percussion. Heart: Normal S1 and S2.  No murmur, rubs or gallops. Pacer wires in place. Abdomen: soft, non-tender, positive bowel sounds.Obese Extremities: No clubbing or cyanosis. No edema. Neurologic: Alert and oriented x 3.    Lab Results:  Basename 02/29/12 0645 02/28/12 0410  WBC 7.4 6.9  HGB 8.8* 8.5*  PLT 105* 75*    Basename 02/29/12 0645 02/28/12 0410  NA 136 137  K 3.7 3.8  CL 99 103  CO2 27 28  GLUCOSE 196* 106*  BUN 15 8  CREATININE 0.43* 0.47*     Telemetry: Appears to be A-V paced. I do not see atrial tachycardia currently.  Personally viewed.    Assessment/Plan:  Principal Problem:  *Unstable angina Active Problems:  Type 2 diabetes mellitus with vascular disease  Obesity (BMI 30-39.9)  Thrombocytopenia  S/P CABG (coronary artery bypass graft)  1. Atrial tachycardia - improved with amiodarone, metoprolol. ? If temp pacer can be DC'd.   2. CAD- Currently aspirin is not on her medication list. Consider if comfortable from anemia and prior thrombocytopenia standpoint.   Will follow.    Ransome Helwig 02/29/2012, 10:13 AM

## 2012-02-29 NOTE — Progress Notes (Signed)
Patient refused to ambulate. She ambulated 2 times during day time.

## 2012-03-01 LAB — GLUCOSE, CAPILLARY
Glucose-Capillary: 156 mg/dL — ABNORMAL HIGH (ref 70–99)
Glucose-Capillary: 169 mg/dL — ABNORMAL HIGH (ref 70–99)
Glucose-Capillary: 254 mg/dL — ABNORMAL HIGH (ref 70–99)
Glucose-Capillary: 271 mg/dL — ABNORMAL HIGH (ref 70–99)

## 2012-03-01 MED ORDER — ASPIRIN EC 325 MG PO TBEC
325.0000 mg | DELAYED_RELEASE_TABLET | Freq: Every day | ORAL | Status: DC
  Administered 2012-03-01 – 2012-03-04 (×4): 325 mg via ORAL
  Filled 2012-03-01 (×4): qty 1

## 2012-03-01 MED ORDER — INSULIN DETEMIR 100 UNIT/ML ~~LOC~~ SOLN
30.0000 [IU] | Freq: Every day | SUBCUTANEOUS | Status: DC
  Administered 2012-03-01 – 2012-03-04 (×4): 30 [IU] via SUBCUTANEOUS

## 2012-03-01 MED ORDER — FUROSEMIDE 40 MG PO TABS
40.0000 mg | ORAL_TABLET | Freq: Two times a day (BID) | ORAL | Status: DC
  Administered 2012-03-01 – 2012-03-03 (×5): 40 mg via ORAL
  Filled 2012-03-01 (×7): qty 1

## 2012-03-01 MED ORDER — INSULIN ASPART 100 UNIT/ML ~~LOC~~ SOLN
6.0000 [IU] | Freq: Three times a day (TID) | SUBCUTANEOUS | Status: DC
  Administered 2012-03-01 – 2012-03-04 (×10): 6 [IU] via SUBCUTANEOUS

## 2012-03-01 NOTE — Progress Notes (Signed)
Subjective:  Sleeping. No complaints.   Objective:  Vital Signs in the last 24 hours: Temp:  [98.1 F (36.7 C)-99.9 F (37.7 C)] 98.1 F (36.7 C) (01/26 0409) Pulse Rate:  [91-98] 91  (01/26 0738) Resp:  [18-19] 19  (01/26 0409) BP: (103-152)/(54-77) 111/55 mmHg (01/26 0738) SpO2:  [96 %-99 %] 97 % (01/26 0409) Weight:  [74.798 kg (164 lb 14.4 oz)] 74.798 kg (164 lb 14.4 oz) (01/26 0409)  Intake/Output from previous day: 01/25 0701 - 01/26 0700 In: 240 [P.O.:240] Out: 200 [Urine:200]   Physical Exam: General: Well developed, well nourished, in no acute distress. Head:  Normocephalic and atraumatic. Lungs: Mildly decreased at bases. Heart: Normal S1 and S2.  No murmur, rubs or gallops.  Abdomen: soft, non-tender, positive bowel sounds. Extremities: No clubbing or cyanosis. Mild BLE edema. Neurologic: Alert and oriented x 3.    Lab Results:  Basename 02/29/12 0645 02/28/12 0410  WBC 7.4 6.9  HGB 8.8* 8.5*  PLT 105* 75*    Basename 02/29/12 0645 02/28/12 0410  NA 136 137  K 3.7 3.8  CL 99 103  CO2 27 28  GLUCOSE 196* 106*  BUN 15 8  CREATININE 0.43* 0.47*    Imaging: Dg Chest 2 View  02/29/2012  *RADIOLOGY REPORT*  Clinical Data: Postop from CABG.  Pleuritic chest pain.  CHEST - 2 VIEW  Comparison: 02/28/2012  Findings: Low lung volumes again seen.  Small bilateral pleural effusions are seen.  No pneumothorax identified. Decreased atelectasis seen in the left retrocardiac lung base.  No evidence of pulmonary infiltrate or edema.  Heart size is within normal limits.  Postop changes from CABG noted.  IMPRESSION: Decreased left basilar atelectasis.  Persistent small bilateral pleural effusions.   Original Report Authenticated By: Myles Rosenthal, M.D.    Personally viewed.   Telemetry: AV pacing Personally viewed.   Cardiac Studies:  Cath - 3v CAD  Assessment/Plan:  Principal Problem:  *Unstable angina Active Problems:  Type 2 diabetes mellitus with vascular  disease  Obesity (BMI 30-39.9)  Thrombocytopenia  S/P CABG (coronary artery bypass graft)  62 year old with CAD post CABG with history of atrial tachycardia/SVT.   1. SVT/ Atach - on amiodarone 200 BID. In review of chart, has seen Dr. Ladona Ridgel in the past because of this very issue. Also on metoprolol 25 BID. AV pacing currently. Hopeful discontinuation of temp pacer soon.   2. CAD - CABG. Slowly improving.   3. PLT's improving. Anemia improving.    [name] 03/01/2012, 10:28 AM

## 2012-03-01 NOTE — Progress Notes (Addendum)
                   [address]            [place] [address]          [phone]      4 Days Post-Op Procedure(s) (LRB): CORONARY ARTERY BYPASS GRAFTING (CABG) (N/A) ENDOVEIN HARVEST OF GREATER SAPHENOUS VEIN (Left)  Subjective: Patient with 2 bowel movements. Feels fairly well this am  Objective: Vital signs in last 24 hours: Temp:  [98.1 F (36.7 C)-99.9 F (37.7 C)] 98.1 F (36.7 C) (01/26 0409) Pulse Rate:  [91-98] 91  (01/26 0738) Cardiac Rhythm:  [-] A-V Sequential paced (01/25 1930) Resp:  [18-19] 19  (01/26 0409) BP: (103-152)/(54-77) 111/55 mmHg (01/26 0738) SpO2:  [96 %-99 %] 97 % (01/26 0409) Weight:  [74.798 kg (164 lb 14.4 oz)] 74.798 kg (164 lb 14.4 oz) (01/26 0409)  Pre op weight 71 kg Current Weight  03/01/12 74.798 kg (164 lb 14.4 oz)      Intake/Output from previous day: 01/25 0701 - 01/26 0700 In: 240 [P.O.:240] Out: 200 [Urine:200]   Physical Exam:  Cardiovascular: RRR, no murmurs, gallops, or rubs. Pulmonary: Diminished at bases, crackles Abdomen: Soft, non tender, bowel sounds present. Extremities: Mild bilateral lower extremity edema. Wounds: Clean and dry.  No erythema or signs of infection.  Lab Results: CBC:  Basename 02/29/12 0645 02/28/12 0410  WBC 7.4 6.9  HGB 8.8* 8.5*  HCT 25.5* 24.4*  PLT 105* 75*   BMET:   Basename 02/29/12 0645 02/28/12 0410  NA 136 137  K 3.7 3.8  CL 99 103  CO2 27 28  GLUCOSE 196* 106*  BUN 15 8  CREATININE 0.43* 0.47*  CALCIUM 8.6 8.3*    PT/INR:  Lab Results  Component Value Date   INR 1.31 02/26/2012   INR 1.00 02/22/2012   INR 0.9 09/28/2008   ABG:  INR: Will add last result for INR, ABG once components are confirmed Will add last 4 CBG results once components are confirmed  Assessment/Plan:  1. CV - AV paced. On Lopressor 25 bid, Amiodarone 200 bid 2.  Pulmonary - Encourage incentive spirometer. On 2 liters of oxygen via [place]-wean as tolerates 3. Volume Overload  - On lasix 40 daily. Had better diuresis with bid so will increase 4.  Acute blood loss anemia - Last H and H 8.8 and 25.5. 5.Thrombocytopenia-platelets up to 105,000 6.DM-CBGs 243/271/209 Pre op HGA1C 8.5. On Metformin,Levemir, and Novolog. Will increase Levemir and Novolog for better glucose control 7.Will place on ecasa as is currently not on this    ZIMMERMAN,DONIELLE MPA-C 03/01/2012,8:07 AM    patient examined and medical record reviewed,agree with above note. VAN TRIGT III,Johnross Nabozny 03/01/2012

## 2012-03-01 NOTE — Progress Notes (Signed)
Patient ambulated 550 feet with RW on room air. Patient tolerated well and returned to chair. Will continue to monitor closely. Lajuana Matte, RN

## 2012-03-01 NOTE — Progress Notes (Signed)
Patient ambulated 550 feet with RW on room air. Patient tolerated well, no SOB. Patient returned to chair, resting comfortably. Will continue to monitor. Lajuana Matte, RN

## 2012-03-02 LAB — BASIC METABOLIC PANEL
Chloride: 98 mEq/L (ref 96–112)
GFR calc Af Amer: >90 mL/min (ref >90)
GFR calc non Af Amer: >90 mL/min (ref >90)
Potassium: 3.6 mEq/L (ref 3.5–5.1)
Sodium: 139 mEq/L (ref 135–145)

## 2012-03-02 LAB — CBC
HCT: 24.1 % — ABNORMAL LOW (ref 36.0–46.0)
Hemoglobin: 8.3 g/dL — ABNORMAL LOW (ref 12.0–15.0)
RDW: 13.3 % (ref 11.5–15.5)
WBC: 5 10*3/uL (ref 4.0–10.5)

## 2012-03-02 LAB — GLUCOSE, CAPILLARY
Glucose-Capillary: 128 mg/dL — ABNORMAL HIGH (ref 70–99)
Glucose-Capillary: 160 mg/dL — ABNORMAL HIGH (ref 70–99)

## 2012-03-02 MED ORDER — METOPROLOL TARTRATE 25 MG PO TABS: 25.0000 mg | ORAL_TABLET | Freq: Two times a day (BID) | ORAL | Status: DC

## 2012-03-02 MED ORDER — FUROSEMIDE 40 MG PO TABS: 40.0000 mg | ORAL_TABLET | Freq: Two times a day (BID) | ORAL | Status: DC

## 2012-03-02 MED ORDER — POTASSIUM CHLORIDE CRYS ER 20 MEQ PO TBCR: 20.0000 meq | EXTENDED_RELEASE_TABLET | Freq: Every day | ORAL | Status: DC

## 2012-03-02 MED ORDER — ATORVASTATIN CALCIUM 20 MG PO TABS: 20.0000 mg | ORAL_TABLET | Freq: Every day | ORAL | Status: DC

## 2012-03-02 MED ORDER — TRAMADOL HCL 50 MG PO TABS: 50.0000 mg | ORAL_TABLET | Freq: Four times a day (QID) | ORAL | Status: DC | PRN

## 2012-03-02 MED ORDER — AMIODARONE HCL 200 MG PO TABS: 200.0000 mg | ORAL_TABLET | Freq: Two times a day (BID) | ORAL | Status: DC

## 2012-03-02 NOTE — Progress Notes (Signed)
Patient had 8 small runs of SVT ranging from 120-147 after pacing wires were pulled this morning. Patient appears asymptomatic and VSS.  Called PA on call and she advised me to keep the patient for 24 hr observation before discharging. Notified Dr. Donnie Aho and he was fine with her staying. Patient will transfer out to a regular telemetry bed (2019). Lajuana Matte, RN

## 2012-03-02 NOTE — Progress Notes (Signed)
Physical Therapy Treatment Patient Details Name: [name] MRN: [id number] DOB: [birth year] Today's Date: 03/02/2012 Time: 0921-1005 PT Time Calculation (min): 44 min  PT Assessment / Plan / Recommendation Comments on Treatment Session  Pt s/p CABG with decr mobility secondary to decr endurance and decr balance.  Also assessed for vertigo.  Pt appears to have a right hypofunction in which PT initiated x1 exercises.  Pt also possibly has right horizontal canal cupulolithiasis however cannot perform BBQ roll secondary to sternal precautions.  Therefore, gave pt forced prolonged postitioning handout to do at night.  Pt verbalizes understanding.  Needs to use RW at home and receive HHPT f/u for vestibular rehab.      Follow Up Recommendations  Home health PT;Supervision/Assistance - 24 hour (for vestibular rehab)                 Equipment Recommendations  None recommended by PT        Frequency Min 3X/week   Plan Discharge plan remains appropriate;Frequency remains appropriate    Precautions / Restrictions Precautions Precautions: Sternal Precaution Comments: Reviewed sternal precautions and gave handout Restrictions Weight Bearing Restrictions: No   Pertinent Vitals/Pain VSS, No pain    Mobility  Bed Mobility Bed Mobility: Rolling Right;Rolling Left;Right Sidelying to Sit;Left Sidelying to Sit;Sit to Sidelying Right;Sit to Sidelying Left Rolling Right: 5: Supervision Rolling Left: 5: Supervision Right Sidelying to Sit: 4: Min assist Left Sidelying to Sit: 4: Min assist Supine to Sit: 4: Min assist;HOB flat Sitting - Scoot to Edge of Bed: 5: Supervision Sit to Supine: Not Tested (comment) Sit to Sidelying Right: 4: Min assist Sit to Sidelying Left: 4: Min assist Details for Bed Mobility Assistance: verbal cues for technique and to maintain precautions.   Transfers Transfers: Sit to Stand;Stand to Sit Sit to Stand: 5: Supervision Stand to Sit: 5: Supervision Stand Pivot  Transfers: Not tested (comment) Details for Transfer Assistance: Pt used UEs minimally for sit to stand.  Balance good statically upon initial stand each time pt performed Ambulation/Gait Ambulation/Gait Assistance: 4: Min guard Ambulation Distance (Feet): 360 Feet Assistive device: None Ambulation/Gait Assistance Details: Pt slightly unsteady with gait.  Noted left beating nystagmus at times.  Pt reports vertigo at times.  No significant LOB with pt self correcting when she does stagger however pt needs to use RW for safety at home and patient aware.  See below for full vestibular assessment performed.   Gait Pattern: Step-through pattern;Decreased stride length;Shuffle Gait velocity: slow gait speed Stairs: No Wheelchair Mobility Wheelchair Mobility: No    Exercises Other Exercises Other Exercises: Initiated x1 exercises and forced prolonged positioning - handouts given   Vestibular assessment       Pt with nystagmus noted with turns while ambulating as well as report of history of vertigo at times.  Pt with negative Modified Hallpike test bil.  Pt with suspicious supine head roll test for right horizontal canal cupulolithiasis but unable to treat secondary to sternal precautions prevent pt from being able to do BBQ roll treatment.  Therefore, gave forced prolonged positioning handout and instructed pt to perform at bedtime.  Pt with positive head thrust right as well.  Suspect pt has developed a right hypofunction given that vertigo has been going on for some time.  Initiated x1 exercises with pt to address hypofunction in standing with feet apart and pt aware that she needs her family member with her when she is doing these exercises.  Progression of exercises included.  Will  need to f/u with vestibular rehab at home.    PT Goals Acute Rehab PT Goals PT Goal: Supine/Side to Sit - Progress: Progressing toward goal PT Goal: Sit to Supine/Side - Progress: Progressing toward goal PT Goal: Sit  to Stand - Progress: Progressing toward goal PT Goal: Ambulate - Progress: Progressing toward goal Additional Goals PT Goal: Additional Goal #1 - Progress: Progressing toward goal Additional Goal #2: Pt to decr vertigo symptoms to 0/10 with movement.   PT Goal: Additional Goal #2 - Progress: Goal set today  Visit Information  Last PT Received On: 03/02/12 Assistance Needed: +1    Subjective Data  Subjective: "I just get unsteady a lot and don't know why."   Cognition  Overall Cognitive Status: Appears within functional limits for tasks assessed/performed Arousal/Alertness: Awake/alert Orientation Level: Appears intact for tasks assessed Behavior During Session: [facility] for tasks performed    Balance  Static Standing Balance Static Standing - Balance Support: No upper extremity supported;During functional activity Static Standing - Level of Assistance: 5: Stand by assistance Static Standing - Comment/# of Minutes: 2 minutes Dynamic Standing Balance Dynamic Standing - Balance Support: No upper extremity supported;During functional activity Dynamic Standing - Level of Assistance: 4: Min assist Dynamic Standing - Balance Activities: Lateral lean/weight shifting;Forward lean/weight shifting;Reaching across midline Dynamic Standing - Comments: 2 minutes.  End of Session PT - End of Session Equipment Utilized During Treatment: Gait belt Activity Tolerance: Patient limited by fatigue Patient left: in chair;with call bell/phone within reach Nurse Communication: Mobility status        INGOLD,Suraj Ramdass 03/02/2012, 10:40 AM  [facility] [phone] (pager)

## 2012-03-02 NOTE — Progress Notes (Signed)
5 Days Post-Op Procedure(s) (LRB): CORONARY ARTERY BYPASS GRAFTING (CABG) (N/A) ENDOVEIN HARVEST OF GREATER SAPHENOUS VEIN (Left) Subjective:  [name] has no complaints this morning.  States she is ready to go home and was told last night she could go this morning.  + BM, + Ambulation  Objective: Vital signs in last 24 hours: Temp:  [98 F (36.7 C)-99.3 F (37.4 C)] 98 F (36.7 C) (01/27 0440) Pulse Rate:  [79-95] 95  (01/27 0821) Cardiac Rhythm:  [-] Normal sinus rhythm (01/26 1930) Resp:  [16-21] 21  (01/27 0440) BP: (99-123)/(57-91) 123/58 mmHg (01/27 0821) SpO2:  [90 %-100 %] 90 % (01/27 0440) Weight:  [160 lb 12.8 oz (72.938 kg)] 160 lb 12.8 oz (72.938 kg) (01/27 0440)   Intake/Output from previous day: 01/26 0701 - 01/27 0700 In: 720 [P.O.:720] Out: 1000 [Urine:1000]  General appearance: alert, cooperative and no distress Neurologic: intact Heart: regular rate and rhythm Lungs: clear to auscultation bilaterally Abdomen: soft, non-tender; bowel sounds normal; no masses,  no organomegaly Extremities: edema trace Wound: clean and djry  Lab Results:  Basename 03/02/12 0530 02/29/12 0645  WBC 5.0 7.4  HGB 8.3* 8.8*  HCT 24.1* 25.5*  PLT 153 105*   BMET:  Basename 03/02/12 0530 02/29/12 0645  NA 139 136  K 3.6 3.7  CL 98 99  CO2 29 27  GLUCOSE 187* 196*  BUN 19 15  CREATININE 0.50 0.43*  CALCIUM 9.1 8.6    PT/INR: No results found for this basename: LABPROT,INR in the last 72 hours ABG    Component Value Date/Time   PHART 7.352 02/26/2012 1621   HCO3 23.2 02/26/2012 1621   TCO2 24 02/27/2012 1701   ACIDBASEDEF 2.0 02/26/2012 1621   O2SAT 99.0 02/26/2012 1621   CBG (last 3)   Basename 03/02/12 0806 03/02/12 0426 03/01/12 2354  GLUCAP 255* 161* 131*    Assessment/Plan: S/P Procedure(s) (LRB): CORONARY ARTERY BYPASS GRAFTING (CABG) (N/A) ENDOVEIN HARVEST OF GREATER SAPHENOUS VEIN (Left)  1. CV- NSR, pacer turned off yesterday, rate tachy, pressure  controlled continue Lopressor, Amiodarone 2. Pulm- no acute issues, off oxygen continue IS 3.  Volume Overload- continue Lasix 4. Acute blood loss anemia stable 5. DM- AM sugar high at 255, will resume home insulin regimen 6. Dispo- will d/c EPW this  Morning, pending no arrythmia will plan of d/c home this afternoon     LOS: 9 days    Denijah Karrer 03/02/2012

## 2012-03-02 NOTE — Discharge Summary (Signed)
Physician Discharge Summary  Patient ID: [name] MRN: [id number] DOB/AGE: 1950/03/30 62 y.o.  Admit date: 02/22/2012 Discharge date: 03/02/2012  Admission Diagnoses:  Patient Active Problem List  Diagnosis  . Type 2 diabetes mellitus with vascular disease  . Hyperlipidemia  . Depression  . Hypertensive heart disease  . CAD  .  Paroxysmal SVT (ANVRT)  . Unstable angina  . Obesity (BMI 30-39.9)  . Thrombocytopenia   Discharge Diagnoses:   Patient Active Problem List  Diagnosis  . Type 2 diabetes mellitus with vascular disease  . Hyperlipidemia  . Depression  . Hypertensive heart disease  . CAD  .  Paroxysmal SVT (ANVRT)  . Unstable angina  . Obesity (BMI 30-39.9)  . Thrombocytopenia  . S/P CABG (coronary artery bypass graft)   Discharged Condition: good  History of Present Illness:   [name] is a 62 yo white female with known history of CAD S/P PCI with stent placement to the Left Circumflex and RCA.  She has not been routinely followed due to difficult financial situations.  She had been doing well since last stent placement.  However, 2 weeks ago she developed chest pressure with exertion.  The pain would occur during most exertional activity including walking to the mailbox.  The pain worsened in severity prompting presentation to the Emergency Department on 02/22/2012.  EKG in the ED did not reveal evidence of MI, but patient was felt to be suffering from unstable angina.  She was subsequently admitted for observation and placed on a Heparin drip.   Hospital Course:   Patient remained chest pain free during hospitalization.  She was taken for cardiac catheterization which revealed severe 3 vessel disease and a preserved EF.  It was felt the patient would benefit from coronary bypass procedure and TCTS was consulted.  Dr. Dorris Fetch evaluated the patient and felt she would benefit from bypass surgery.  The risks and benefits of the procedure were explained to the  patient and she was agreeable to proceed.  The patient was taken to the operating room on 02/26/2012.  She underwent CABG x 3 utilizing LIMA to LAD, SVG to OM, SVG to RCA.  She also underwent Endoscopic Saphenous vein harvest of the left thigh.  The patient tolerated the procedure well and was taken to the ICU in stable condition.  The patient was extubated the evening of surgery.  During her stay in the ICU she was weaned off her insulin drip.  Her chest tubes and arterial lines were removed without difficulty.  She developed episodes of Paroxysmal Atrial Fibrillation.  She was placed on Amiodarone.  Once medically stable she was transferred to the stepdown unit in stable condition for further convalescence.  Since transfer patient has been doing well.  She has been weaned off her temporary pacer.  She is maintaining NSR and her pacing wires were removed without difficulty.  Her blood sugars are moderately controlled and she will be placed back on her home insulin regimen.  She is medically stable at this time and we will plan to discharge later this afternoon, pending no new problems arise.                   Consults: cardiology  Significant Diagnostic Studies: angiography:   HEMODYNAMICS: Aortic post contrast 118/57, LV post contrast 118/7. There was no gradient between the left ventricle and aorta.  ANGIOGRAPHIC DATA:  CORONARY ARTERIES: Arise and distribute normally. Right dominant. Dense coronary calcification is noted practically in the  left coronary system.  Left main coronary artery: Mild distal narrowing.  Left anterior descending: Severely calcified proximally, eccentric 80% proximal stenosis followed by a severe 95% proximal stenosis and segmental disease. Distal vessel has mild distal disease..  Circumflex coronary artery: Heavily calcified proximally. Severe ostial segmental 95% stenosis prior to the prior stent that is calcified. The stent has a moderate 30-40% in-stent restenosis that is  focal..  Right coronary artery: Catheter damping with ostial 60-70% stenosis. Previous stent site is patent.  LEFT VENTRICULOGRAM: Performed in the 30 RAO projection. The aortic and mitral valves are normal. The left ventricle is normal in size with normal wall motion. Estimated ejection fraction is 60%.  Treatments: surgery:   Median sternotomy, extracorporeal circulation, coronary  bypass grafting x3 (left internal mammary artery to LAD, saphenous vein  graft to obtuse marginal, saphenous vein graft to distal right  coronary), endoscopic vein harvest, left thigh.   Disposition: 01-Home or Self Care  The patient has been discharged on:   1.Beta Blocker:  Yes [x  ]                              No   [   ]                              If No, reason:  2.Ace Inhibitor/ARB: Yes [   ]                                     No  [  x ]                                     If No, reason: preserved EF, Labile blood pressure  3.Statin:   Yes [x  ]                  No  [   ]                  If No, reason:  4.Ecasa:  Yes  [ x ]                  No   [   ]                  If No, reason:     Discharge Orders    Future Appointments: Provider: Department: Dept Phone: Center:   03/17/2012 3:00 PM Loreli Slot, MD [facility] [phone] [facility]       Medication List     As of 03/02/2012 10:03 AM    STOP taking these medications         hydrochlorothiazide 12.5 MG capsule   Commonly known as: MICROZIDE      PRESCRIPTION MEDICATION      quinapril 40 MG tablet   Commonly known as: ACCUPRIL      TAKE these medications         amiodarone 200 MG tablet   Commonly known as: PACERONE   Take 1 tablet (200 mg total) by mouth 2 (two) times daily.      aspirin 325 MG tablet   Take 325 mg by mouth at bedtime.  Coral Calcium 1000 (390 CA) MG Tabs   Take 1,000 mg by mouth 2 (two) times daily.      fish oil-omega-3 fatty acids 1000  MG capsule   Take 2 g by mouth 2 (two) times daily.      furosemide 40 MG tablet   Commonly known as: LASIX   Take 1 tablet (40 mg total) by mouth 2 (two) times daily. For 5 Days      insulin detemir 100 UNIT/ML injection   Commonly known as: LEVEMIR   Inject 100 Units into the skin at bedtime.      insulin regular 100 units/mL injection   Commonly known as: NOVOLIN R,HUMULIN R   Inject 10 Units into the skin 3 (three) times daily. 30 minutes after each meal      metFORMIN 1000 MG tablet   Commonly known as: GLUCOPHAGE   Take 1,000 mg by mouth 2 (two) times daily with a meal.      metoprolol tartrate 25 MG tablet   Commonly known as: LOPRESSOR   Take 1 tablet (25 mg total) by mouth 2 (two) times daily.      potassium chloride SA 20 MEQ tablet   Commonly known as: K-DUR,KLOR-CON   Take 1 tablet (20 mEq total) by mouth daily. For 5 Days      sertraline 50 MG tablet   Commonly known as: ZOLOFT   Take 50 mg by mouth at bedtime.      simvastatin 40 MG tablet   Commonly known as: ZOCOR   Take 40 mg by mouth at bedtime.      traMADol 50 MG tablet   Commonly known as: ULTRAM   Take 1 tablet (50 mg total) by mouth every 6 (six) hours as needed.           Follow-up Information    Follow up with Loreli Slot, MD. On 03/17/2012. (Appointment is at 3:00)    Contact information:   [address] [employer] [address] [place] [address] [phone]       Follow up with [facility]. On 03/17/2012. (Please get chest xray at 2:00pm)    Contact information:   [place]       Schedule an appointment as soon as possible for a visit with TILLEY JR,W SPENCER, MD. (Please contact office to set up follow up for 2-4 weeks)    Contact information:   [address] [place] [address] [phone]       Schedule an appointment as soon as possible for a visit with Kristian Covey, MD. (For 4 weeks, Diabetes management)    Contact information:   [address] [place] [address] [phone]          Signed: Lowella Dandy 03/02/2012, 10:03 AM

## 2012-03-02 NOTE — Progress Notes (Signed)
Subjective:   Alert and oriented.  Not SOB.  Rhtyhm is better Objective:  Vital Signs in the last 24 hours: BP 123/58  Pulse 95  Temp 98 F (36.7 C) (Oral)  Resp 21  Ht 5\' 2"  (1.575 m)  Wt 72.938 kg (160 lb 12.8 oz)  BMI 29.41 kg/m2  SpO2 90%  Physical Exam: Obese white female in no acute distress Lungs:  Clear  Cardiac:  Regular rhythm, normal S1 and S2, no S3  Intake/Output from previous day: 01/26 0701 - 01/27 0700 In: 720 [P.O.:720] Out: 1000 [Urine:1000]  Weight Filed Weights   02/29/12 0435 03/01/12 0409 03/02/12 0440  Weight: 73.664 kg (162 lb 6.4 oz) 74.798 kg (164 lb 14.4 oz) 72.938 kg (160 lb 12.8 oz)    Lab Results: Basic Metabolic Panel:  Basename 03/02/12 0530 02/29/12 0645  NA 139 136  K 3.6 3.7  CL 98 99  CO2 29 27  GLUCOSE 187* 196*  BUN 19 15  CREATININE 0.50 0.43*   CBC:  Basename 03/02/12 0530 02/29/12 0645  WBC 5.0 7.4  NEUTROABS -- --  HGB 8.3* 8.8*  HCT 24.1* 25.5*  MCV 91.6 94.1  PLT 153 105*   Telemetry: Sinus rhythm  Assessment/Plan: 1. CAD, doing well post CABG 2. Type 2 diabetes 3.  Volume overload better 4. Atrial tachycardia better  Recommendations:  I am OK with discharge.  She needs to see me in 2 weeks.   Darden Palmer  MD [facility]  03/02/2012, 10:05 AM

## 2012-03-02 NOTE — Progress Notes (Signed)
[id number] Pt walked with PT. For d/c. Education completed with pt. Understanding voiced. Permission given to refer to [facility] Phase 2. Encouraged following low sodium diet and watching carbs. Kellee Sittner DunlapRN

## 2012-03-02 NOTE — Progress Notes (Signed)
Pt ambulated approx 700 feet with rolling walker. Pt denies dizziness, SOB. Pt tolerated very well.

## 2012-03-02 NOTE — Progress Notes (Signed)
Removed EPW and CT sutures per MD order per hospital policy. Pacing wires intact, slight resistance on left side, no drainage. Patient tolerated well. Applied benzoin and steri strips to CT suture site. VSS. Patient reminded to remain in bed for 1 hour. Will continue to monitor closely. Lajuana Matte, RN

## 2012-03-03 LAB — GLUCOSE, CAPILLARY
Glucose-Capillary: 148 mg/dL — ABNORMAL HIGH (ref 70–99)
Glucose-Capillary: 180 mg/dL — ABNORMAL HIGH (ref 70–99)
Glucose-Capillary: 190 mg/dL — ABNORMAL HIGH (ref 70–99)
Glucose-Capillary: 210 mg/dL — ABNORMAL HIGH (ref 70–99)

## 2012-03-03 MED ORDER — FUROSEMIDE 40 MG PO TABS
40.0000 mg | ORAL_TABLET | Freq: Every day | ORAL | Status: DC
  Administered 2012-03-04: 40 mg via ORAL
  Filled 2012-03-03: qty 1

## 2012-03-03 MED ORDER — METOPROLOL TARTRATE 50 MG PO TABS
50.0000 mg | ORAL_TABLET | Freq: Two times a day (BID) | ORAL | Status: DC
  Administered 2012-03-03 – 2012-03-04 (×2): 50 mg via ORAL
  Filled 2012-03-03 (×4): qty 1

## 2012-03-03 NOTE — Progress Notes (Signed)
Subjective:  She has had some brief runs of atrial tachycardia when wires pulled and some since.  Moderately depressed over not going home yesterday and also c/o chest wall pain. Objective:  Vital Signs in the last 24 hours: BP 106/44  Pulse 85  Temp 98.7 F (37.1 C) (Oral)  Resp 18  Ht 5\' 2"  (1.575 m)  Wt 71.986 kg (158 lb 11.2 oz)  BMI 29.03 kg/m2  SpO2 92%  Physical Exam: Obese white female in no acute distress Lungs:  Clear  Cardiac:  Regular rhythm, normal S1 and S2, no S3  Intake/Output from previous day: 01/27 0701 - 01/28 0700 In: 640 [P.O.:640] Out: 1600 [Urine:1600]  Weight Filed Weights   03/01/12 0409 03/02/12 0440 03/03/12 0053  Weight: 74.798 kg (164 lb 14.4 oz) 72.938 kg (160 lb 12.8 oz) 71.986 kg (158 lb 11.2 oz)    Lab Results: Basic Metabolic Panel:  Basename 03/02/12 0530  NA 139  K 3.6  CL 98  CO2 29  GLUCOSE 187*  BUN 19  CREATININE 0.50   CBC:  Basename 03/02/12 0530  WBC 5.0  NEUTROABS --  HGB 8.3*  HCT 24.1*  MCV 91.6  PLT 153   Telemetry: Brief runs nonsuustained of atrial tachycardia  Assessment/Plan: 1. CAD, doing well post CABG 2. Type 2 diabetes 3.  Volume overload better 4. Atrial tachycardia nonsustained  Recommendations:  I would agree with increase in beta blockers.  Continue amiodarone.  If only nonsustained and brief I think she could go home in am if feeling OK.    Darden Palmer  MD [facility]  03/03/2012, 3:53 PM

## 2012-03-03 NOTE — Progress Notes (Addendum)
[address]            [place] [address]          [phone]     6 Days Post-Op  Procedure(s) (LRB): CORONARY ARTERY BYPASS GRAFTING (CABG) (N/A) ENDOVEIN HARVEST OF GREATER SAPHENOUS VEIN (Left) Subjective: Feels ok  Objective  Telemetry having short bursts of SVT into 140's  Temp:  [98 F (36.7 C)-99.6 F (37.6 C)] 98.4 F (36.9 C) (01/28 0539) Pulse Rate:  [79-90] 79  (01/28 0539) Resp:  [18-19] 19  (01/28 0539) BP: (108-122)/(50-65) 108/65 mmHg (01/28 0539) SpO2:  [90 %-95 %] 92 % (01/28 1100) Weight:  [158 lb 11.2 oz (71.986 kg)] 158 lb 11.2 oz (71.986 kg) (01/28 0053)   Intake/Output Summary (Last 24 hours) at 03/03/12 1159 Last data filed at 03/03/12 0806  Gross per 24 hour  Intake    760 ml  Output   1800 ml  Net  -1040 ml       General appearance: alert, cooperative and no distress Heart: regular rate and rhythm Lungs: mildly dim in bases Abdomen: benign Extremities: mild edema Wound: incisions healing well  Lab Results:  Basename 03/02/12 0530  NA 139  K 3.6  CL 98  CO2 29  GLUCOSE 187*  BUN 19  CREATININE 0.50  CALCIUM 9.1  MG --  PHOS --   No results found for this basename: AST:2,ALT:2,ALKPHOS:2,BILITOT:2,PROT:2,ALBUMIN:2 in the last 72 hours No results found for this basename: LIPASE:2,AMYLASE:2 in the last 72 hours  Basename 03/02/12 0530  WBC 5.0  NEUTROABS --  HGB 8.3*  HCT 24.1*  MCV 91.6  PLT 153   No results found for this basename: CKTOTAL:4,CKMB:4,TROPONINI:4 in the last 72 hours No components found with this basename: POCBNP:3 No results found for this basename: DDIMER in the last 72 hours No results found for this basename: HGBA1C in the last 72 hours No results found for this basename: CHOL,HDL,LDLCALC,TRIG,CHOLHDL in the last 72 hours No results found for this basename: TSH,T4TOTAL,FREET3,T3FREE,THYROIDAB in the last 72 hours No results found for this basename:  VITAMINB12,FOLATE,FERRITIN,TIBC,IRON,RETICCTPCT in the last 72 hours  Medications: Scheduled    . amiodarone  200 mg Oral BID  . aspirin EC  325 mg Oral Daily  . atorvastatin  20 mg Oral QHS  . bisacodyl  10 mg Oral Daily   Or  . bisacodyl  10 mg Rectal Daily  . docusate sodium  200 mg Oral Daily  . furosemide  40 mg Oral BID  . insulin aspart  0-24 Units Subcutaneous Q4H  . insulin aspart  6 Units Subcutaneous TID WC  . insulin detemir  30 Units Subcutaneous Daily  . metFORMIN  1,000 mg Oral BID WC  . metoprolol tartrate  25 mg Oral BID  . pantoprazole  40 mg Oral Daily  . potassium chloride  20 mEq Oral Daily  . sertraline  50 mg Oral QHS  . sodium chloride  3 mL Intravenous Q12H     Radiology/Studies:  No results found.  INR: Will add last result for INR, ABG once components are confirmed Will add last 4 CBG results once components are confirmed  Assessment/Plan: S/P Procedure(s) (LRB): CORONARY ARTERY BYPASS GRAFTING (CABG) (N/A) ENDOVEIN HARVEST OF GREATER SAPHENOUS VEIN (Left)  1 still with short episodes of SVT, will increase beta blocker dose 2 decrease lasix to daily 3 Arrange home PT  4 push pulm toilet/rehab as tol  LOS: 10 days    GOLD,WAYNE E 1/28/201411:59 AM    I have seen and examined the patient and agree with the assessment and plan as outlined.  Jaleesa Cervi H 03/03/2012 2:34 PM

## 2012-03-03 NOTE — Progress Notes (Signed)
Pt ambulated approx. 1000 feet with standby assistance. Pt tolerated very well.

## 2012-03-03 NOTE — Progress Notes (Signed)
Physical Therapy Treatment Patient Details Name: [name] MRN: [id number] DOB: [birth year] Today's Date: 03/03/2012 Time: 4098-1191 PT Time Calculation (min): 17 min  PT Assessment / Plan / Recommendation Comments on Treatment Session  Pt s/p CABG with decr mobility secondary to decr endurance and decr balance.  Pt refused any vestibular activity today.  Only agreed to ambulate.  Recommend that pt use a RW at all times on d/c.  Pt verbalizes that she will do so.  Recommend HHPT for vestibular Rehab.  Pt appears very different in her affect today as compared to yesterday where she was very animated.      Follow Up Recommendations  Home health PT;Supervision/Assistance - 24 hour (for vestibular rehab)                 Equipment Recommendations  None recommended by PT       Frequency Min 3X/week   Plan Discharge plan remains appropriate;Frequency remains appropriate    Precautions / Restrictions Precautions Precautions: Sternal Restrictions Weight Bearing Restrictions: No   Pertinent Vitals/Pain VSS, No pain    Mobility  Bed Mobility Bed Mobility: Not assessed Transfers Transfers: Sit to Stand;Stand to Sit Sit to Stand: Without upper extremity assist;6: Modified independent (Device/Increase time);From chair/3-in-1 Stand to Sit: Without upper extremity assist;6: Modified independent (Device/Increase time);To chair/3-in-1 Stand Pivot Transfers: Not tested (comment) Details for Transfer Assistance: Pt was up walking when PT entered room.  Had a LOB while ambulating around the BS table but self corrected and stated, "Vertigo". When pt did perform sit to stand to sit did so safely and following sternal precautions.   Ambulation/Gait Ambulation/Gait Assistance: 4: Min guard Ambulation Distance (Feet): 360 Feet Assistive device: None Ambulation/Gait Assistance Details: Pt wanted to ambulate without RW.  Performed fairly well without RW with occasional listing to right or left but  without LOB.  Pt reports no vertigo symptoms while in hallway.  Spoke with pt that PT recommends her use RW at all times upon d/c to home and pt states that she will at home.  Pt refused to perform any vestibular exercises and refused for PT to further assess vestibular system.  Pt states she did not do any of the exercises yesterday or today because the "didn't feel like it".   Gait Pattern: Step-through pattern;Decreased stride length;Shuffle Gait velocity: slow gait speed Stairs: No Wheelchair Mobility Wheelchair Mobility: No    PT Goals Acute Rehab PT Goals PT Goal: Sit to Stand - Progress: Progressing toward goal PT Goal: Ambulate - Progress: Progressing toward goal Additional Goals PT Goal: Additional Goal #1 - Progress: Progressing toward goal  Visit Information  Last PT Received On: 03/03/12 Assistance Needed: +1    Subjective Data  Subjective: "Great," pt stated when asked how her day was.  Pt very different today with flat affect and short one word responses.     Cognition  Overall Cognitive Status: Appears within functional limits for tasks assessed/performed Arousal/Alertness: Awake/alert Orientation Level: Appears intact for tasks assessed Behavior During Session: Flat affect Cognition - Other Comments: Pt was very interactive yesterday with PT however today almost appears depressed.  Very short one word responses and not wanting to do much of anything whereas yesterday she was animated and spent a lot of time doing exercise with this PT.      Balance  Static Standing Balance Static Standing - Balance Support: No upper extremity supported;During functional activity Static Standing - Level of Assistance: 5: Stand by assistance Static Standing - Comment/#  of Minutes: Stood at sink and washed hands without assist for 1 minute Dynamic Standing Balance Dynamic Standing - Balance Support: No upper extremity supported;During functional activity Dynamic Standing - Level of  Assistance: 5: Stand by assistance Dynamic Standing - Balance Activities: Lateral lean/weight shifting;Forward lean/weight shifting;Reaching across midline;Reaching for objects Dynamic Standing - Comments: Self corrects when she loses balance.    End of Session PT - End of Session Equipment Utilized During Treatment: Gait belt Activity Tolerance: Patient tolerated treatment well Patient left: in chair;with call bell/phone within reach Nurse Communication: Mobility status       INGOLD,Zarie Kosiba 03/03/2012, 11:52 AM  [facility] [phone] (pager)

## 2012-03-03 NOTE — Progress Notes (Signed)
CARDIAC REHAB PHASE I   PRE:  Rate/Rhythm: 96SR  BP:  Supine:   Sitting: 120/60  Standing:    SaO2: 94%RA  MODE:  Ambulation: 550 ft   POST:  Rate/Rhythem: 93SR  BP:  Supine:   Sitting: 130/62  Standing:    SaO2: 92%RA 0820-0840 Pt very quiet and only speaks if she is asked question. Seems depressed. Quite a difference from her affect during ed yesterday. She was excited yesterday to be going home. Walked 550 ft with hand held asst. Gait steady. Tolerated well. To recliner after walk. Call bell in reach. No arrhythmia noted.  [name]

## 2012-03-04 LAB — GLUCOSE, CAPILLARY
Glucose-Capillary: 136 mg/dL — ABNORMAL HIGH (ref 70–99)
Glucose-Capillary: 157 mg/dL — ABNORMAL HIGH (ref 70–99)

## 2012-03-04 MED ORDER — INSULIN DETEMIR 100 UNIT/ML ~~LOC~~ SOLN: 30.0000 [IU] | Freq: Every day | SUBCUTANEOUS | Status: DC

## 2012-03-04 MED ORDER — METOPROLOL TARTRATE 25 MG PO TABS: 37.5000 mg | ORAL_TABLET | Freq: Two times a day (BID) | ORAL | Status: DC

## 2012-03-04 MED ORDER — AMIODARONE HCL 200 MG PO TABS: 200.0000 mg | ORAL_TABLET | Freq: Two times a day (BID) | ORAL | Status: DC

## 2012-03-04 MED ORDER — ATORVASTATIN CALCIUM 20 MG PO TABS: 20.0000 mg | ORAL_TABLET | Freq: Every day | ORAL | Status: DC

## 2012-03-04 NOTE — Progress Notes (Signed)
[address]            [place] [address]          [phone]     7 Days Post-Op  Procedure(s) (LRB): CORONARY ARTERY BYPASS GRAFTING (CABG) (N/A) ENDOVEIN HARVEST OF GREATER SAPHENOUS VEIN (Left) Subjective: Feels better   Objective  Telemetry sinus rhythm  Temp:  [98.3 F (36.8 C)-99.4 F (37.4 C)] 98.3 F (36.8 C) (01/29 0434) Pulse Rate:  [69-88] 69  (01/29 0434) Resp:  [18] 18  (01/29 0434) BP: (93-106)/(44-59) 93/57 mmHg (01/29 0434) SpO2:  [91 %-92 %] 92 % (01/29 0434) Weight:  [153 lb 10.6 oz (69.7 kg)] 153 lb 10.6 oz (69.7 kg) (01/29 0434)   Intake/Output Summary (Last 24 hours) at 03/04/12 0738 Last data filed at 03/04/12 0500  Gross per 24 hour  Intake   1200 ml  Output    652 ml  Net    548 ml       General appearance: alert, cooperative and no distress Heart: regular rate and rhythm Lungs: clear to auscultation bilaterally Abdomen: benign Extremities: no edema Wound: incisions healing well  Lab Results:  Basename 03/02/12 0530  NA 139  K 3.6  CL 98  CO2 29  GLUCOSE 187*  BUN 19  CREATININE 0.50  CALCIUM 9.1  MG --  PHOS --   No results found for this basename: AST:2,ALT:2,ALKPHOS:2,BILITOT:2,PROT:2,ALBUMIN:2 in the last 72 hours No results found for this basename: LIPASE:2,AMYLASE:2 in the last 72 hours  Basename 03/02/12 0530  WBC 5.0  NEUTROABS --  HGB 8.3*  HCT 24.1*  MCV 91.6  PLT 153   No results found for this basename: CKTOTAL:4,CKMB:4,TROPONINI:4 in the last 72 hours No components found with this basename: POCBNP:3 No results found for this basename: DDIMER in the last 72 hours No results found for this basename: HGBA1C in the last 72 hours No results found for this basename: CHOL,HDL,LDLCALC,TRIG,CHOLHDL in the last 72 hours No results found for this basename: TSH,T4TOTAL,FREET3,T3FREE,THYROIDAB in the last 72 hours No results found for this basename:  VITAMINB12,FOLATE,FERRITIN,TIBC,IRON,RETICCTPCT in the last 72 hours  Medications: Scheduled    . amiodarone  200 mg Oral BID  . aspirin EC  325 mg Oral Daily  . atorvastatin  20 mg Oral QHS  . bisacodyl  10 mg Oral Daily   Or  . bisacodyl  10 mg Rectal Daily  . docusate sodium  200 mg Oral Daily  . furosemide  40 mg Oral Daily  . insulin aspart  0-24 Units Subcutaneous Q4H  . insulin aspart  6 Units Subcutaneous TID WC  . insulin detemir  30 Units Subcutaneous Daily  . metFORMIN  1,000 mg Oral BID WC  . metoprolol tartrate  50 mg Oral BID  . pantoprazole  40 mg Oral Daily  . potassium chloride  20 mEq Oral Daily  . sertraline  50 mg Oral QHS  . sodium chloride  3 mL Intravenous Q12H     Radiology/Studies:  No results found.  INR: Will add last result for INR, ABG once components are confirmed Will add last 4 CBG results once components are confirmed  Assessment/Plan: S/P Procedure(s) (LRB): CORONARY ARTERY BYPASS GRAFTING (CABG) (N/A) ENDOVEIN HARVEST OF GREATER SAPHENOUS VEIN (Left)  Rhythm stable, bp a little soft, will stop lasix and go to lopressor 37.5 bid Cbg's adequate control but may need furthur adjustments  as outpatient Otherwise appears quite stable for d/c   LOS: 11 days    [name] 1/29/20147:38 AM

## 2012-03-04 NOTE — Progress Notes (Signed)
Discharge instructions given to pt along with prescriptions. Educated pt on new medications. Pt verbalized her understanding. Pt is stable for discharge to home with her sister.

## 2012-03-04 NOTE — Discharge Summary (Signed)
                   [address]            [name] [address]          [phone]     Brief addendum:   Patient had some atrial tachycardias And betablocker has been adjusted. She is now maintaining sinus rhythm. She is otherwise stable for D/C  Current med list at discharge:    Medication List     As of 03/04/2012  8:06 AM    STOP taking these medications         hydrochlorothiazide 12.5 MG capsule   Commonly known as: MICROZIDE      PRESCRIPTION MEDICATION      quinapril 40 MG tablet   Commonly known as: ACCUPRIL      simvastatin 40 MG tablet   Commonly known as: ZOCOR      TAKE these medications         amiodarone 200 MG tablet   Commonly known as: PACERONE   Take 1 tablet (200 mg total) by mouth 2 (two) times daily.      aspirin 325 MG tablet   Take 325 mg by mouth at bedtime.      atorvastatin 20 MG tablet   Commonly known as: LIPITOR   Take 1 tablet (20 mg total) by mouth at bedtime.      Coral Calcium 1000 (390 CA) MG Tabs   Take 1,000 mg by mouth 2 (two) times daily.      fish oil-omega-3 fatty acids 1000 MG capsule   Take 2 g by mouth 2 (two) times daily.      insulin detemir 100 UNIT/ML injection   Commonly known as: LEVEMIR   Inject 30 Units into the skin at bedtime.      insulin regular 100 units/mL injection   Commonly known as: NOVOLIN R,HUMULIN R   Inject 10 Units into the skin 3 (three) times daily. 30 minutes after each meal      metFORMIN 1000 MG tablet   Commonly known as: GLUCOPHAGE   Take 1,000 mg by mouth 2 (two) times daily with a meal.      metoprolol tartrate 25 MG tablet   Commonly known as: LOPRESSOR   Take 1.5 tablets (37.5 mg total) by mouth 2 (two) times daily.      potassium chloride SA 20 MEQ tablet   Commonly known as: K-DUR,KLOR-CON   Take 1 tablet (20 mEq total) by mouth daily. For 5 Days      sertraline 50 MG tablet   Commonly known as: ZOLOFT   Take 50 mg by mouth at bedtime.      traMADol  50 MG tablet   Commonly known as: ULTRAM   Take 1 tablet (50 mg total) by mouth every 6 (six) hours as needed.

## 2012-03-04 NOTE — Progress Notes (Signed)
Subjective:  No atrial tachycardia seen.   Objective:  Vital Signs in the last 24 hours: BP 109/66  Pulse 69  Temp 98.3 F (36.8 C) (Oral)  Resp 18  Ht 5\' 2"  (1.575 m)  Wt 69.7 kg (153 lb 10.6 oz)  BMI 28.10 kg/m2  SpO2 92%  Physical Exam: Obese white female in no acute distress Lungs:  Clear  Cardiac:  Regular rhythm, normal S1 and S2, no S3  Intake/Output from previous day: 01/28 0701 - 01/29 0700 In: 1200 [P.O.:1200] Out: 652 [Urine:652]  Weight Filed Weights   03/02/12 0440 03/03/12 0053 03/04/12 0434  Weight: 72.938 kg (160 lb 12.8 oz) 71.986 kg (158 lb 11.2 oz) 69.7 kg (153 lb 10.6 oz)    Lab Results: Basic Metabolic Panel:  Basename 03/02/12 0530  NA 139  K 3.6  CL 98  CO2 29  GLUCOSE 187*  BUN 19  CREATININE 0.50   CBC:  Basename 03/02/12 0530  WBC 5.0  NEUTROABS --  HGB 8.3*  HCT 24.1*  MCV 91.6  PLT 153   Telemetry: Brief runs nonsuustained of atrial tachycardia  Assessment/Plan: 1. CAD, doing well post CABG 2. Type 2 diabetes 3.  Volume overload better 4. Atrial tachycardia nonsustained resolved  Recommendations:  I think OK to go home.  I will see in 2 weeks.   Darden Palmer  MD [facility]  03/04/2012, 9:12 AM

## 2012-03-17 ENCOUNTER — Encounter: Payer: Self-pay | Admitting: Thoracic Surgery (Cardiothoracic Vascular Surgery)

## 2012-03-24 ENCOUNTER — Encounter: Payer: Self-pay | Admitting: Cardiology

## 2012-03-24 NOTE — Progress Notes (Signed)
Patient ID: [name], female   DOB: 10/28/1950, 62 y.o.   MRN: [id number]   [name], [name]    Date of visit:  03/24/2012 DOB:  08/14/1950    Age:  62 yrs. Medical record number:  [id number]     Account number:  [id number] Primary Care Provider: Evelena Peat ____________________________ CURRENT DIAGNOSES  1. CAD,Native  2. Hypertension,Essential (Benign)  3. Angina- unstable  4. Hyperlipidemia  5. Obesity(BMI30-40)  6. Surgery-Aortocoronary Bypass Grafting  7. Diabetes Mellitus Type II controlled with vascular disease ____________________________ ALLERGIES  NKDA ____________________________ MEDICATIONS  1. furosemide 40 mg tablet, 1 p.o. daily  2. metformin 500 mg tablet, BID  3. metoprolol tartrate 25 mg tablet, 1-1/2 p.o. b.i.d.  4. aspirin 325 mg tablet, 1 p.o. daily  5. Fish Oil 1,000 mg capsule, 2 bid  6. Calcium 600 + D(3) 600-125 mg-unit tablet, BID  7. Zoloft 50 mg tablet, QHS  8. atorvastatin 40 mg tablet, 1 p.o. daily  9. Humulin R 100 unit/mL solution, 10u pc  10. Levemir 100 unit/mL solution, 30u qhs  11. amiodarone 200 mg tablet, 1 p.o. daily ____________________________ CHIEF COMPLAINTS  Followup of F/u cad ____________________________ HISTORY OF PRESENT ILLNESS  Patient seen for cardiac followup. She was seen as a new patient in the hospital when she presented with unstable angina. She has had poorly controlled diabetes and has really not had good medical care in the past few years because she has not been really going to the doctor. She had three-vessel disease and had bypass and did have some atrial arrhythmias following bypass. She was maintained on amiodarone. Since going on she had some recurrent edema and had to go back on Lasix after her prescription ran out. She has had the usual amount of chest wall soreness. She denies PND orthopnea notes the edema has gone down now. She states her diabetic control is better now. ____________________________ PAST  HISTORY  Past Medical Illnesses:  hypertension, DM-insulin dependent, hyperlipidemia, obesity;  Cardiovascular Illnesses:  CAD, arrhythmia-PSVT, atrial fibrillation;  Surgical Procedures:  CABG;  Cardiology Procedures-Invasive:  cardiac cath (left) January 2014, stent June 2010, [place] nodal ablation May 2010, CABG with LIMA to LAD, SVG to OM, SVG to RCA Dr. Dorris Fetch;  Cardiac Cath Results:  normal Left main, 90% stenosis proximal LAD, 70% stenosis proximal CFX, 95% stenosis RCA;  LVEF not documented ____________________________ CARDIO-PULMONARY TEST DATES EKG Date:  03/24/2012;   Cardiac Cath Date:  02/24/2012;   ____________________________ SOCIAL HISTORY Alcohol Use:  no alcohol use;  Smoking:  never smoked;  Diet:  regular diet;  Lifestyle:  divorced;  Exercise:  no regular exercise;  Occupation:  retired;   ____________________________ REVIEW OF SYSTEMS General:  obesity  Integumentary:  no rashes or new skin lesions.  Eyes:  wears eye glasses/contact lenses  Respiratory:  denies dyspnea, cough, wheezing or hemoptysis.  Cardiovascular:  please review HPI  Abdominal:  denies dyspepsia, GI bleeding, constipation, or diarrhea  Genitourinary-Female:  no dysuria, urgency, frequency, UTIs, or stress incontinence Musculoskeletal:  denies arthritis, venous insufficiency, or muscle weakness.  Psychiatric:  anxiety ____________________________ PHYSICAL EXAMINATION VITAL SIGNS  Blood Pressure:  130/66 Sitting, Right arm, regular cuff  , 132/60 Standing, Right arm and regular cuff   Pulse:  76/min. Weight:  158.00 lbs. Height:  62"BMI: 29  Constitutional:  pleasant white female in no acute distress Skin:  warm and dry to touch, no apparent skin lesions, or masses noted. Head:  normocephalic, normal hair pattern, no masses or tenderness  Neck:  supple, without massess. No JVD, thyromegaly or carotid bruits. Carotid upstroke normal. Chest:  normal symmetry, clear to auscultation and percussion., healed  median sternotomy scar Cardiac:  regular rhythm, normal S1 and S2, No S3 or S4, no murmurs, gallops or rubs detected. Peripheral Pulses:  the femoral,dorsalis pedis, and posterior tibial pulses are full and equal bilaterally with no bruits auscultated. Extremities & Back:  well healed saphenous vein donor site LLE, no edema present Neurological:  no gross motor or sensory deficits noted, affect appropriate, oriented x3. ___________________________ IMPRESSIONS/PLAN  1. Significant 3 vessel coronary artery disease with recent bypass grafting 2. Diabetes mellitus vascular complications 3. Hypertension 4. History of supraventricular tachycardia and atrial fibrillation on amiodarone therapy  Recommendations:  Reduce amiodarone to 200 mg daily. Followup in one month. She states that she is getting at her primary care  through Dr. Caryl Never. Discussed importance of regular exercise and getting involved in rehabilitation. ____________________________ TODAYS ORDERS  1. Basic Metabolic Panel: Today  2. Return Visit: 1 month  3. 12 Lead EKG: Today                       ____________________________ Cardiology Physician:  Darden Palmer MD [facility]

## 2012-03-26 ENCOUNTER — Other Ambulatory Visit: Payer: Self-pay | Admitting: *Deleted

## 2012-03-26 DIAGNOSIS — I2510 Atherosclerotic heart disease of native coronary artery without angina pectoris: Secondary | ICD-10-CM

## 2012-03-31 ENCOUNTER — Encounter: Payer: Self-pay | Admitting: Thoracic Surgery (Cardiothoracic Vascular Surgery)

## 2012-03-31 ENCOUNTER — Ambulatory Visit (INDEPENDENT_AMBULATORY_CARE_PROVIDER_SITE_OTHER): Payer: Self-pay | Admitting: Thoracic Surgery (Cardiothoracic Vascular Surgery)

## 2012-03-31 ENCOUNTER — Ambulatory Visit
Admission: RE | Admit: 2012-03-31 | Discharge: 2012-03-31 | Disposition: A | Discharge status: Home / Self Care | Payer: No Typology Code available for payment source | Source: Ambulatory Visit | Attending: Thoracic Surgery (Cardiothoracic Vascular Surgery) | Admitting: Thoracic Surgery (Cardiothoracic Vascular Surgery)

## 2012-03-31 VITALS — BP 123/62 | HR 68 | Resp 20 | Ht 62.0 in | Wt 156.0 lb

## 2012-03-31 DIAGNOSIS — I2510 Atherosclerotic heart disease of native coronary artery without angina pectoris: Secondary | ICD-10-CM

## 2012-03-31 DIAGNOSIS — Z951 Presence of aortocoronary bypass graft: Secondary | ICD-10-CM

## 2012-03-31 NOTE — Progress Notes (Signed)
HPI:  [name] is a 62 year old woman who returns for a scheduled postoperative followup visit. She had coronary bypass grafting x3 on January 22. Postoperatively she did well she did have some supraventricular tachycardias which were controlled with medications. She did not require anticoagulation.  Says that she's been feeling well. She does have some incisional discomfort, but is only taking the Ultram about once every other day or so. She does need a refill she would like to have something done around. Her blood sugars have been better controlled and they had been previously. However she is still running in the 160-180 range in the mornings. Overall she feels well and is anxious to resume normal activities.  Past Medical History  Diagnosis Date  . CAD     Coronary disease status post non-ST elevation MI in May 2010 with  PCI of the left circumflex on Jun 24, 2008. She then had a PCI of  the RCA on July 15, 2008.    . Diabetes mellitus, insulin dependent (IDDM), uncontrolled   . Hyperlipidemia   . Hypertensive heart disease   .  Paroxysmal SVT (ANVRT)     S/p AV nodal ablation Dr. Ladona Ridgel 09/28/08   . Depression       Current Outpatient Prescriptions  Medication Sig Dispense Refill  . amiodarone (PACERONE) 200 MG tablet Take 200 mg by mouth daily.      Marland Kitchen aspirin 325 MG tablet Take 325 mg by mouth at bedtime.       Marland Kitchen atorvastatin (LIPITOR) 20 MG tablet Take 1 tablet (20 mg total) by mouth at bedtime.  30 tablet  1  . Coral Calcium 1000 (390 CA) MG TABS Take 1,000 mg by mouth 2 (two) times daily.      . fish oil-omega-3 fatty acids 1000 MG capsule Take 2 g by mouth 2 (two) times daily.       . insulin detemir (LEVEMIR) 100 UNIT/ML injection Inject 30 Units into the skin at bedtime.  10 mL  1  . insulin regular (NOVOLIN R,HUMULIN R) 100 units/mL injection Inject 10 Units into the skin 3 (three) times daily. 30 minutes after each meal      . metFORMIN (GLUCOPHAGE) 1000 MG tablet Take  1,000 mg by mouth 2 (two) times daily with a meal.      . metoprolol tartrate (LOPRESSOR) 25 MG tablet Take 1.5 tablets (37.5 mg total) by mouth 2 (two) times daily.  75 tablet  1  . sertraline (ZOLOFT) 50 MG tablet Take 50 mg by mouth at bedtime.       . traMADol (ULTRAM) 50 MG tablet Take 1 tablet (50 mg total) by mouth every 6 (six) hours as needed.  30 tablet  0   No current facility-administered medications for this visit.    Physical Exam BP 123/62  Pulse 68  Resp 20  Ht 5\' 2"  (1.575 m)  Wt 156 lb (70.761 kg)  BMI 28.53 kg/m2  SpO35 77% 62 year old woman in no acute distress Lungs clear with equal breath sounds Cardiac regular rate and rhythm 2/6 systolic murmur no rub Sternum stable, incision clean dry and intact Leg incisions healing well bilaterally No peripheral edema  Diagnostic Tests: Chest x-ray 03/31/12 CHEST - 2 VIEW  Comparison: Chest x-ray of 02/29/2012  Findings: Only a tiny left pleural effusion remains. Aeration has  improved slightly. Cardiomegaly is stable. Median sternotomy  sutures are noted.  IMPRESSION:  Only a tiny left pleural effusion remains.  Impression: [name]  is a 62 year old woman who had coronary bypass grafting about a month ago. She is doing well at this point in time. She is having some incisional discomfort, which is not unexpected. She only occasionally has to take Ultram for pain. Her exercise tolerance is good and is improving. She is anxious to resume full activities.  I think at this point she is safe to drive. She was cautioned not to drive after taking tramadol. She will limit herself to low speeds and short trips (duration less than 15 minutes) for the next 3-4 weeks.  She's not to lift anything over 10 pounds for another 2 weeks or anything over 20 pounds for another 4 weeks. Other than that her activities are unrestricted.  Her blood sugars continue to be elevated with a.m. levels of 160-180. She will followup with Dr.  Sander Radon regarding that issue.   Plan: She was given a prescription for Ultram 50-100 mg by mouth 3 times a day when necessary, 30 tablets no refills.  She will continue to be followed by Dr. Donnie Aho for her cardiac issues  I will be happy to see her back any time the future if I can be of any further assistance with her care

## 2012-04-21 ENCOUNTER — Encounter: Payer: Self-pay | Admitting: Cardiology

## 2012-04-21 NOTE — Progress Notes (Signed)
Patient ID: [name], female   DOB: 11-04-1950, 62 y.o.   MRN: [id number]  Danielle, Hart    Date of visit:  04/21/2012 DOB:  Mar 14, 1950    Age:  62 yrs. Medical record number:  [id number]     Account number:  [id number] Primary Care Provider: Evelena Peat ____________________________ CURRENT DIAGNOSES  1. CAD,Native  2. Hypertension,Essential (Benign)  3. Hyperlipidemia  4. Obesity(BMI30-40)  5. Diabetes Mellitus Type II controlled with vascular disease  6. Surgery-Aortocoronary Bypass Grafting ____________________________ ALLERGIES  NKDA ____________________________ MEDICATIONS  1. furosemide 40 mg tablet, 1 p.o. daily  2. Fish Oil 1,000 mg capsule, 2 bid  3. Calcium 600 + D(3) 600-125 mg-unit tablet, BID  4. Zoloft 50 mg tablet, QHS  5. Humulin R 100 unit/mL solution, 10u pc  6. Levemir 100 unit/mL solution, 30u qhs  7. metformin 1,000 mg tablet, BID  8. Lipitor 40 mg tablet, 1/2 tab daily  9. metoprolol tartrate 25 mg tablet, 1-1/2 p.o. b.i.d.  10. tramadol 50 mg tablet, PRN  11. aspirin 81 mg tablet,chewable, 1 p.o. daily ____________________________ CHIEF COMPLAINTS  Followup of CAD,Native ____________________________ HISTORY OF PRESENT ILLNESS  Patient seen for cardiac followup. 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 She has only a mild amount of chest wall soreness and denies angina. She has no PND, orthopnea, syncope, or claudication. She still has some edema and requires Lasix. She has had no recurrence of SVT. ____________________________ PAST HISTORY  Past Medical Illnesses:  hypertension, DM-insulin dependent, hyperlipidemia, obesity;  Cardiovascular Illnesses:  CAD, arrhythmia-PSVT, atrial fibrillation;  Surgical Procedures:  CABG;  Cardiology Procedures-Invasive:  cardiac cath (left) January 2014, stent June 2010, [place] nodal ablation May 2010, CABG with LIMA to LAD, SVG to  OM, SVG to RCA Dr. Dorris Fetch;  Cardiac Cath Results:  normal Left main, 90% stenosis proximal LAD, 70% stenosis proximal CFX, 95% stenosis RCA;  LVEF of 60% documented via cardiac cath on 02/24/2012 ____________________________ CARDIO-PULMONARY TEST DATES EKG Date:  03/24/2012;   Cardiac Cath Date:  02/24/2012;  Chest Xray Date: 03/24/2012;   ____________________________ FAMILY HISTORY Father -  deceased, CHF and COPD; Mother - age 63,  deceased; Brother 1 - age 70,  alive and well and [place]; Sister 1 - age 14,  alive and well and  diabetes-unknown type;  ____________________________ SOCIAL HISTORY Alcohol Use:  no alcohol use;  Smoking:  never smoked;  Diet:  regular diet;  Lifestyle:  divorced;  Exercise:  no regular exercise;  Occupation:  retired;   ____________________________ REVIEW OF SYSTEMS General:  obesity  Integumentary:  no rashes or new skin lesions.  Eyes:  wears eye glasses/contact lenses Respiratory:  denies dyspnea, cough, wheezing or hemoptysis.  Cardiovascular:  please review HPI  Psychiatric:  anxiety ____________________________ PHYSICAL EXAMINATION VITAL SIGNS  Blood Pressure:  110/60 Sitting, Left arm, regular cuff  , 114/52 Standing, Left arm and regular cuff   Pulse:  68/min. Weight:  160.00 lbs. Height:  62"BMI: 29  Constitutional:  pleasant white female in no acute distress Skin:  warm and dry to touch, no apparent skin lesions, or masses noted. Head:  normocephalic, normal hair pattern, no masses or tenderness Neck:  supple, without massess. No JVD, thyromegaly or carotid bruits. Carotid upstroke normal. Chest:  normal symmetry, clear to auscultation and percussion., healed median sternotomy scar Cardiac:  regular rhythm, normal S1 and S2, No S3 or S4, no murmurs,  gallops or rubs detected. Peripheral Pulses:  the femoral,dorsalis pedis, and posterior tibial pulses are full and equal bilaterally with no bruits auscultated. Extremities & Back:  well healed  saphenous vein donor site LLE, no edema present Neurological:  no gross motor or sensory deficits noted, affect appropriate, oriented x3. ____________________________ MOST RECENT LIPID PANEL 02/23/12  CHOL TOTL 128 mg/dl, HDL 22 mg/dl and TRIGLYCER 161 mg/dl ____________________________ IMPRESSIONS/PLAN  1. Coronary artery disease with previous bypass grafting no angina 2. Diabetes with vascular disease 3 hypertension controlled 4. Hyperlipidemia  Recommendations:  She may discontinue amiodarone at this time. I asked her to reduce her aspirin 81 mg daily. She has not followed up with a primary care physician. She asks about a referral to endocrinology specialists. I told her that she would need followup either with an endocrinologist or her primary care physician about her diabetes. Followup in 3 months. ____________________________ TODAYS ORDERS  1. Return Visit: 3 months                       ____________________________ Cardiology Physician:  Darden Palmer MD [facility]

## 2012-07-23 ENCOUNTER — Encounter: Payer: Self-pay | Admitting: Cardiology

## 2012-07-23 NOTE — Progress Notes (Signed)
Patient ID: [name], female   DOB: April 06, 1950, 62 y.o.   MRN: [id number]  [place], [place]    Date of visit:  07/23/2012 DOB:  05-19-1950    Age:  62 yrs. Medical record number:  [id number]     Account number:  [id number] Primary Care Provider: Evelena Peat ____________________________ CURRENT DIAGNOSES  1. CAD,Native  2. Hypertension,Essential (Benign)  3. Hyperlipidemia  4. Obesity(BMI30-40)  5. Diabetes Mellitus Type II controlled with vascular disease  6. Surgery-Aortocoronary Bypass Grafting ____________________________ ALLERGIES  No Known Drug Allergies ____________________________ MEDICATIONS  1. furosemide 40 mg tablet, 1 p.o. daily  2. Fish Oil 1,000 mg capsule, 2 bid  3. Calcium 600 + D(3) 600-125 mg-unit tablet, BID  4. Zoloft 50 mg tablet, QHS  5. Humulin R 100 unit/mL solution, 10u pc  6. metformin 1,000 mg tablet, BID  7. Lipitor 40 mg tablet, 1/2 tab daily  8. aspirin 81 mg tablet,chewable, 1 p.o. daily  9. metoprolol tartrate 25 mg tablet, BID  10. Levemir 100 unit/mL solution, 50 u qhs ____________________________ CHIEF COMPLAINTS  Followup of CAD,Native ____________________________ HISTORY OF PRESENT ILLNESS  Patient seen for cardiac followup. She does have some mild tightness of the skin where she had her previous median sternotomy but is feeling well and has no recurrence of angina. She has not seen her primary care physician. She is not currently having angina and denies PND orthopnea or claudication. Her diabetic control has only been fair. She has increased the amount of insulin that she has been taking recently. .____________________________ PAST HISTORY  Past Medical Illnesses:  hypertension, DM-insulin dependent, hyperlipidemia, obesity;  Cardiovascular Illnesses:  CAD, arrhythmia-PSVT, atrial fibrillation;  Surgical Procedures:  CABG;  Cardiology Procedures-Invasive:  cardiac cath (left) January 2014, stent June 2010, AV nodal ablation May 2010, CABG  with LIMA to LAD, SVG to OM, SVG to RCA Dr. Dorris Fetch;  Cardiac Cath Results:  normal Left main, 90% stenosis proximal LAD, 70% stenosis proximal CFX, 95% stenosis RCA;  LVEF of 60% documented via cardiac cath on 02/24/2012,   ____________________________ CARDIO-PULMONARY TEST DATES EKG Date:  03/24/2012;   Cardiac Cath Date:  02/24/2012;  Chest Xray Date: 03/24/2012;   ____________________________ SOCIAL HISTORY Alcohol Use:  no alcohol use;  Smoking:  never smoked;  Diet:  regular diet;  Lifestyle:  divorced;  Exercise:  no regular exercise;  Occupation:  retired;   ____________________________ REVIEW OF SYSTEMS General:  obesity  Integumentary:no rashes or new skin lesions. Eyes: wears eye glasses/contact lenses Respiratory: denies dyspnea, cough, wheezing or hemoptysis. Cardiovascular:  please review HPI Psychiatric:  anxiety  ____________________________ PHYSICAL EXAMINATION VITAL SIGNS  Blood Pressure:  120/66 Sitting, Right arm, regular cuff   Pulse:  78/min. Weight:  156.00 lbs. Height:  62"BMI: 28  Constitutional:  pleasant white female, in no acute distress Skin:  warm and dry to touch, no apparent skin lesions, or masses noted. Head:  normocephalic, normal hair pattern, no masses or tenderness Neck:  supple, without massess. No JVD, thyromegaly or carotid bruits. Carotid upstroke normal. Chest:  normal symmetry, clear to auscultation and percussion., healed median sternotomy scar Cardiac:  regular rhythm, normal S1 and S2, No S3 or S4, no murmurs, gallops or rubs detected. Peripheral Pulses:  the femoral,dorsalis pedis, and posterior tibial pulses are full and equal bilaterally with no bruits auscultated. Extremities & Back:  well healed saphenous vein donor site LLE, no edema present Neurological:  no gross motor or sensory deficits noted, affect appropriate, oriented x3. ____________________________ MOST RECENT  LIPID PANEL 02/23/12  CHOL TOTL 128 mg/dl, LDL 80 calc, HDL 22  mg/dl and TRIGLYCER 161 mg/dl ____________________________ IMPRESSIONS/PLAN  1. Coronary artery disease 3 vessel with recent bypass grafting 2. Hyperlipidemia under treatment 3. Diabetes mellitus insulin-dependent 4. Overweight but with some weight loss since here  Recommendations:  Continue her current cardiac treatment and followup in 6 months. We discussed importance of followup for her diabetic care and routine monitoring for diabetic complications ____________________________ TODAYS ORDERS  1. Return Visit: 6 months                       ____________________________ Cardiology Physician:  Darden Palmer MD [facility]

## 2012-08-10 ENCOUNTER — Ambulatory Visit (INDEPENDENT_AMBULATORY_CARE_PROVIDER_SITE_OTHER): Payer: Medicaid Other | Admitting: Family Medicine

## 2012-08-10 VITALS — BP 100/58 | HR 68 | Temp 98.4°F | Wt 159.0 lb

## 2012-08-10 DIAGNOSIS — I798 Other disorders of arteries, arterioles and capillaries in diseases classified elsewhere: Secondary | ICD-10-CM

## 2012-08-10 DIAGNOSIS — R42 Dizziness and giddiness: Secondary | ICD-10-CM

## 2012-08-10 DIAGNOSIS — I2510 Atherosclerotic heart disease of native coronary artery without angina pectoris: Secondary | ICD-10-CM

## 2012-08-10 DIAGNOSIS — E1159 Type 2 diabetes mellitus with other circulatory complications: Secondary | ICD-10-CM

## 2012-08-10 LAB — HM DIABETES EYE EXAM

## 2012-08-10 LAB — HM DIABETES FOOT EXAM: HM Diabetic Foot Exam: NORMAL

## 2012-08-10 NOTE — Patient Instructions (Addendum)
Set up eye exam Titrate Levemir up 2 units every 3 days until fasting sugars are consistently < 130 Continue with Humulin 10 units three times daily with meals.

## 2012-08-10 NOTE — Progress Notes (Signed)
Subjective:    Patient ID: [name], female    DOB: 05/22/1950, 62 y.o.   MRN: [id number]  HPI Medical followup Long history of poor compliance. For several years her diabetes has been followed by [facility] [place] [facility]  She had recent bypass back in January and is on recently well since then. Her medications are reviewed. For her diabetes, she takes Glucophage as Levemir or 50 units once daily. Humulin 10 units 3 times a day with meals. Fasting blood sugars are still consistently around 200-250. No eye exam apparently in several years.  No recent chest pains. She has some chronic vertigo for the past 3-4 years. Symptoms are intermittent. It was treated once by another provider with Valium which did not help. Symptoms are sometimes triggered by side to side head movement. No history of ataxia. No focal weakness.  Past Medical History  Diagnosis Date  . Diabetes mellitus, insulin dependent (IDDM), uncontrolled   . Hyperlipidemia   . Hypertensive heart disease   .  Paroxysmal SVT (ANVRT)     S/p AV nodal ablation Dr. Ladona Ridgel 09/28/08   . Depression   . CAD     Coronary disease status post non-ST elevation MI in May 2010 with  PCI of the left circumflex on Jun 24, 2008. She then had a PCI of  the RCA on July 15, 2008. 02/24/12 Cath, Severe 95% LAD, ostial 95% circ, ostial RCA, patent stents in distal RCA and mid circ normal LV 02/26/12 CABG with LIMA to LAD, SVG to RCA, and SVG to OM Dr. Dorris Fetch     Past Surgical History  Procedure Laterality Date  . Coronary angioplasty with stent placement    . Coronary artery bypass graft  02/26/2012    Dorris Fetch, MD;  Location: [facility];  Service: Open Heart Surgery;  Laterality: N/A;  CABG x three,  using left internal mammary artery  and left leg greater saphenous vein,   . Endovein harvest of greater saphenous vein  02/26/2012    reports that she has never smoked. She has never used smokeless tobacco. She reports that she does  not drink alcohol or use illicit drugs. family history is not on file. No Known Allergies    Review of Systems  Constitutional: Negative for unexpected weight change.  HENT: Negative for hearing loss, ear pain and tinnitus.   Respiratory: Negative for shortness of breath.   Cardiovascular: Negative for chest pain.  Endocrine: Negative for polydipsia and polyuria.  Genitourinary: Negative for dysuria.  Neurological: Positive for dizziness. Negative for tremors, seizures, syncope, weakness and headaches.       Objective:   Physical Exam  Constitutional: She is oriented to person, place, and time. She appears well-developed and well-nourished.  HENT:  Mouth/Throat: Oropharynx is clear and moist.  Eyes: Pupils are equal, round, and reactive to light.  Neck: Neck supple. No thyromegaly present.  No carotid bruits  Cardiovascular: Normal rate and regular rhythm.   Pulmonary/Chest: Effort normal and breath sounds normal. No respiratory distress. She has no wheezes. She has no rales.  Lymphadenopathy:    She has no cervical adenopathy.  Neurological: She is alert and oriented to person, place, and time. No cranial nerve deficit.  No focal weakness.  Normal finger to nose.  Gait normal.  No nysatagmus  Skin:  Feet reveal no lesions. No impairment with monofilament testing  Psychiatric: She has a normal mood and affect. Her behavior is normal.  Assessment & Plan:  #1 type 2 diabetes. History of poor control. Titrate Levemir up 2 units every 3 days fastings consistently around 130 or less. Continue Humulin R 10 units 3 times a day with meals. Recheck A1c today. Set up eye exam. Reassess 3 months #2 chronic vertigo. Consider vestibular rehabilitation

## 2012-09-14 ENCOUNTER — Telehealth: Payer: Self-pay | Admitting: Family Medicine

## 2012-09-14 NOTE — Telephone Encounter (Signed)
Set up referral to Dr Stephannie Li.

## 2012-09-14 NOTE — Telephone Encounter (Signed)
PT states that when she seen her opthalmologist, they found that her diabetes has caused damage with her eyes. She states that she now needs referred to a retina specialist, and the ophthalmologist has sent [place] those records and referral request. Please assist.

## 2012-09-14 NOTE — Telephone Encounter (Signed)
Pt aware about her referral.

## 2012-09-17 ENCOUNTER — Telehealth: Payer: Self-pay | Admitting: Family Medicine

## 2012-09-17 NOTE — Telephone Encounter (Signed)
Dr. Cherlynn Polo ([facility]) office called to make aware that pt is being sent to a retina Specialists and will be seen by Dr. Lelon Perla on 8/29/ @ 10:30

## 2012-09-22 ENCOUNTER — Encounter: Payer: Self-pay | Admitting: Family Medicine

## 2012-10-08 LAB — HM DIABETES EYE EXAM

## 2012-10-28 ENCOUNTER — Ambulatory Visit: Payer: Medicaid Other | Attending: Family Medicine | Admitting: Rehabilitative and Restorative Service Providers"

## 2012-11-10 ENCOUNTER — Ambulatory Visit: Payer: Medicaid Other | Admitting: Family Medicine

## 2012-11-19 ENCOUNTER — Encounter: Payer: Self-pay | Admitting: Family Medicine

## 2012-11-19 ENCOUNTER — Ambulatory Visit (INDEPENDENT_AMBULATORY_CARE_PROVIDER_SITE_OTHER): Payer: Medicaid Other | Admitting: Family Medicine

## 2012-11-19 VITALS — BP 100/60 | HR 77 | Temp 98.0°F | Wt 156.0 lb

## 2012-11-19 DIAGNOSIS — E11319 Type 2 diabetes mellitus with unspecified diabetic retinopathy without macular edema: Secondary | ICD-10-CM

## 2012-11-19 DIAGNOSIS — E1139 Type 2 diabetes mellitus with other diabetic ophthalmic complication: Secondary | ICD-10-CM

## 2012-11-19 DIAGNOSIS — I119 Hypertensive heart disease without heart failure: Secondary | ICD-10-CM

## 2012-11-19 DIAGNOSIS — I798 Other disorders of arteries, arterioles and capillaries in diseases classified elsewhere: Secondary | ICD-10-CM

## 2012-11-19 DIAGNOSIS — E1159 Type 2 diabetes mellitus with other circulatory complications: Secondary | ICD-10-CM

## 2012-11-19 DIAGNOSIS — E785 Hyperlipidemia, unspecified: Secondary | ICD-10-CM

## 2012-11-19 MED ORDER — FUROSEMIDE 40 MG PO TABS: 40.0000 mg | ORAL_TABLET | Freq: Every day | ORAL | Status: DC

## 2012-11-19 MED ORDER — ATORVASTATIN CALCIUM 20 MG PO TABS: 20.0000 mg | ORAL_TABLET | Freq: Every day | ORAL | Status: DC

## 2012-11-19 NOTE — Patient Instructions (Signed)
Start Humalog 10 units three times daily with meals (in place of Humulin R) Continue with same dose of Levemir.

## 2012-11-19 NOTE — Progress Notes (Signed)
Subjective:    Patient ID: [name], female    DOB: 1950-06-09, 62 y.o.   MRN: [id number]  HPI Medical followup. History of type 2 diabetes, CAD, obesity, hypertension, dyslipidemia, and history of chronic poor compliance. She had bypass last year and has done well from a cardiac standpoint. For several years, she has been followed by [facility] regarding her diabetes.  She requested our input starting a few months ago.  When she came to [place] she was taking Levemir 50 units once daily and Humulin R 10 units 3 times daily with meals. We gave her titration regimen for levemir and she is currently up to 74 units. Her fasting blood sugars have improved around 250 to around170. Last A1c 7.7%. She takes her Humulin R but is actually waiting about 30 minutes after meals to take this. She is not checking any postprandial blood sugars. No recent hypoglycemia. No chest pains. Compliant with other medications. She takes Lipitor 20 mg daily for hyperlipidemia.  She has had some retinopathy changes. She had eye exam September 4.  Past Medical History  Diagnosis Date  . Diabetes mellitus, insulin dependent (IDDM), uncontrolled   . Hyperlipidemia   . Hypertensive heart disease   .  Paroxysmal SVT (ANVRT)     S/p AV nodal ablation Dr. Ladona Ridgel 09/28/08   . Depression   . CAD     Coronary disease status post non-ST elevation MI in May 2010 with  PCI of the left circumflex on Jun 24, 2008. She then had a PCI of  the RCA on July 15, 2008. 02/24/12 Cath, Severe 95% LAD, ostial 95% circ, ostial RCA, patent stents in distal RCA and mid circ normal LV 02/26/12 CABG with LIMA to LAD, SVG to RCA, and SVG to OM Dr. Dorris Fetch     Past Surgical History  Procedure Laterality Date  . Coronary angioplasty with stent placement    . Coronary artery bypass graft  02/26/2012    Dorris Fetch, MD;  Location: [facility];  Service: Open Heart Surgery;  Laterality: N/A;  CABG x three,  using left internal  mammary artery  and left leg greater saphenous vein,   . Endovein harvest of greater saphenous vein  02/26/2012    reports that she has never smoked. She has never used smokeless tobacco. She reports that she does not drink alcohol or use illicit drugs. family history is not on file. No Known Allergies    Review of Systems  Constitutional: Negative for fatigue and unexpected weight change.  Eyes: Negative for visual disturbance.  Respiratory: Negative for cough, chest tightness, shortness of breath and wheezing.   Cardiovascular: Negative for chest pain, palpitations and leg swelling.  Endocrine: Negative for polydipsia and polyuria.  Neurological: Negative for dizziness, seizures, syncope, weakness, light-headedness and headaches.       Objective:   Physical Exam  Constitutional: She appears well-developed and well-nourished.  HENT:  Mouth/Throat: Oropharynx is clear and moist.  Neck: Neck supple. No thyromegaly present.  Cardiovascular: Normal rate and regular rhythm.   Pulmonary/Chest: Effort normal and breath sounds normal. No respiratory distress. She has no wheezes. She has no rales.  Musculoskeletal: She exhibits no edema.  Skin: No rash noted.  Feet reveal no lesions. Normal sensory function to touch. No calluses          Assessment & Plan:  #1 type 2 diabetes. History of poor control but improving. We have suggested meal time Humalog and take at the beginning of  meals and she will continue 10 units with meals. Continue levemir with slow titration until fasting blood sugars consistently around 130 or less. Recheck A1c. #2 dyslipidemia. Repeat lipid and hepatic panel #3 history of CAD. She's not any recent chest pains or other concerns #4 hypertension which is stable

## 2012-11-20 LAB — HEMOGLOBIN A1C: Hgb A1c MFr Bld: 8 % — ABNORMAL HIGH (ref 4.6–6.5)

## 2012-11-20 LAB — LIPID PANEL
Cholesterol: 129 mg/dL (ref 0–200)
HDL: 37.3 mg/dL — ABNORMAL LOW (ref >39.00)
Triglycerides: 221 mg/dL — ABNORMAL HIGH (ref 0.0–149.0)

## 2012-11-20 LAB — BASIC METABOLIC PANEL
CO2: 27 mEq/L (ref 19–32)
Calcium: 9.3 mg/dL (ref 8.4–10.5)
GFR: 129.18 mL/min (ref >60.00)
Sodium: 139 mEq/L (ref 135–145)

## 2012-11-20 LAB — HEPATIC FUNCTION PANEL
ALT: 25 U/L (ref 0–35)
Albumin: 4 g/dL (ref 3.5–5.2)
Total Bilirubin: 0.4 mg/dL (ref 0.3–1.2)
Total Protein: 7.2 g/dL (ref 6.0–8.3)

## 2012-12-10 ENCOUNTER — Telehealth: Payer: Self-pay | Admitting: Family Medicine

## 2012-12-10 MED ORDER — SERTRALINE HCL 50 MG PO TABS: 50.0000 mg | ORAL_TABLET | Freq: Every day | ORAL | Status: DC

## 2012-12-10 NOTE — Telephone Encounter (Signed)
Pt needs new rx zoloft 50 mg#30 with refills once a day. Pt was getting med [facility] [place]; please call pt once rx has been call in

## 2012-12-10 NOTE — Telephone Encounter (Signed)
RX sent to pharmacy  

## 2013-02-01 ENCOUNTER — Encounter: Payer: Self-pay | Admitting: Family Medicine

## 2013-02-01 ENCOUNTER — Ambulatory Visit (INDEPENDENT_AMBULATORY_CARE_PROVIDER_SITE_OTHER): Payer: Medicaid Other | Admitting: Family Medicine

## 2013-02-01 VITALS — BP 124/64 | HR 85 | Temp 98.0°F | Wt 162.0 lb

## 2013-02-01 DIAGNOSIS — E1159 Type 2 diabetes mellitus with other circulatory complications: Secondary | ICD-10-CM

## 2013-02-01 DIAGNOSIS — I798 Other disorders of arteries, arterioles and capillaries in diseases classified elsewhere: Secondary | ICD-10-CM

## 2013-02-01 LAB — HEMOGLOBIN A1C: Hgb A1c MFr Bld: 7.9 % — ABNORMAL HIGH (ref 4.6–6.5)

## 2013-02-01 NOTE — Progress Notes (Signed)
Pre visit review using our clinic review tool, if applicable. No additional management support is needed unless otherwise documented below in the visit note. 

## 2013-02-01 NOTE — Progress Notes (Signed)
   Subjective:    Patient ID: [name], female    DOB: [birth year], 62 y.o.   MRN: [id number]  HPI Followup regarding her type 2 diabetes. She has complications of retinopathy. She currently takes Levemir 80 units once daily and Humalog 10 units with meals. She does not check postprandial blood sugars. Fasting blood sugars have still been ranging around 170-180 though she has had poor dietary compliance. Her blood pressure has been well controlled. No dizziness. Eye exams have been normal recently. She does not take ACE inhibitor or angiotensin receptor blocker and no known contraindications. We do not have any recent urine microalbumin on record.  Past Medical History  Diagnosis Date  . Diabetes mellitus, insulin dependent (IDDM), uncontrolled   . Hyperlipidemia   . Hypertensive heart disease   .  Paroxysmal SVT (ANVRT)     S/p AV nodal ablation Dr. Ladona Ridgel 09/28/08   . Depression   . CAD     Coronary disease status post non-ST elevation MI in May 2010 with  PCI of the left circumflex on Jun 24, 2008. She then had a PCI of  the RCA on July 15, 2008. 02/24/12 Cath, Severe 95% LAD, ostial 95% circ, ostial RCA, patent stents in distal RCA and mid circ normal LV 02/26/12 CABG with LIMA to LAD, SVG to RCA, and SVG to OM Dr. Dorris Fetch     Past Surgical History  Procedure Laterality Date  . Coronary angioplasty with stent placement    . Coronary artery bypass graft  02/26/2012    Dorris Fetch, MD;  Location: [facility];  Service: Open Heart Surgery;  Laterality: N/A;  CABG x three,  using left internal mammary artery  and left leg greater saphenous vein,   . Endovein harvest of greater saphenous vein  02/26/2012    reports that she has never smoked. She has never used smokeless tobacco. She reports that she does not drink alcohol or use illicit drugs. family history is not on file. No Known Allergies    Review of Systems  Constitutional: Negative for fatigue and unexpected weight change.  Eyes:  Negative for visual disturbance.  Respiratory: Negative for cough, chest tightness, shortness of breath and wheezing.   Cardiovascular: Negative for chest pain, palpitations and leg swelling.  Endocrine: Negative for polydipsia and polyuria.  Neurological: Negative for dizziness, seizures, syncope, weakness, light-headedness and headaches.       Objective:   Physical Exam  Constitutional: She appears well-developed and well-nourished.  Neck: Neck supple. No thyromegaly present.  Cardiovascular: Normal rate.   Pulmonary/Chest: Effort normal and breath sounds normal. No respiratory distress. She has no wheezes. She has no rales.  Musculoskeletal: She exhibits no edema.          Assessment & Plan:  Type 2 diabetes. Check urine microalbumin. Repeat A1c. We've recommended checking some postprandial two-hour blood sugars and if consistently greater than 180 consider titrating her mealtime insulin to 12 units.

## 2013-02-01 NOTE — Patient Instructions (Signed)
Check some 2 hour postprandial blood sugars and if consistently > 180, consider increasing our Humalog to 12 units with meals.

## 2013-02-18 ENCOUNTER — Ambulatory Visit: Payer: Medicaid Other | Admitting: Family Medicine

## 2013-02-27 ENCOUNTER — Encounter (HOSPITAL_COMMUNITY): Payer: Self-pay | Admitting: Emergency Medicine

## 2013-02-27 ENCOUNTER — Emergency Department (HOSPITAL_COMMUNITY)
Admission: EM | Admit: 2013-02-27 | Discharge: 2013-02-28 | Disposition: A | Discharge status: Home / Self Care | Payer: Medicaid Other | Attending: Emergency Medicine | Admitting: Emergency Medicine

## 2013-02-27 DIAGNOSIS — T38801A Poisoning by unspecified hormones and synthetic substitutes, accidental (unintentional), initial encounter: Secondary | ICD-10-CM | POA: Insufficient documentation

## 2013-02-27 DIAGNOSIS — Z951 Presence of aortocoronary bypass graft: Secondary | ICD-10-CM | POA: Insufficient documentation

## 2013-02-27 DIAGNOSIS — Z9861 Coronary angioplasty status: Secondary | ICD-10-CM | POA: Insufficient documentation

## 2013-02-27 DIAGNOSIS — Y929 Unspecified place or not applicable: Secondary | ICD-10-CM | POA: Insufficient documentation

## 2013-02-27 DIAGNOSIS — E785 Hyperlipidemia, unspecified: Secondary | ICD-10-CM | POA: Insufficient documentation

## 2013-02-27 DIAGNOSIS — Z79899 Other long term (current) drug therapy: Secondary | ICD-10-CM | POA: Insufficient documentation

## 2013-02-27 DIAGNOSIS — I471 Supraventricular tachycardia, unspecified: Secondary | ICD-10-CM | POA: Insufficient documentation

## 2013-02-27 DIAGNOSIS — E119 Type 2 diabetes mellitus without complications: Secondary | ICD-10-CM | POA: Insufficient documentation

## 2013-02-27 DIAGNOSIS — I2510 Atherosclerotic heart disease of native coronary artery without angina pectoris: Secondary | ICD-10-CM | POA: Insufficient documentation

## 2013-02-27 DIAGNOSIS — Z794 Long term (current) use of insulin: Secondary | ICD-10-CM | POA: Insufficient documentation

## 2013-02-27 DIAGNOSIS — I119 Hypertensive heart disease without heart failure: Secondary | ICD-10-CM | POA: Insufficient documentation

## 2013-02-27 DIAGNOSIS — Z7982 Long term (current) use of aspirin: Secondary | ICD-10-CM | POA: Insufficient documentation

## 2013-02-27 DIAGNOSIS — F3289 Other specified depressive episodes: Secondary | ICD-10-CM | POA: Insufficient documentation

## 2013-02-27 DIAGNOSIS — F329 Major depressive disorder, single episode, unspecified: Secondary | ICD-10-CM | POA: Insufficient documentation

## 2013-02-27 DIAGNOSIS — Y9389 Activity, other specified: Secondary | ICD-10-CM | POA: Insufficient documentation

## 2013-02-27 DIAGNOSIS — T383X1A Poisoning by insulin and oral hypoglycemic [antidiabetic] drugs, accidental (unintentional), initial encounter: Secondary | ICD-10-CM

## 2013-02-27 LAB — BASIC METABOLIC PANEL
BUN: 20 mg/dL (ref 6–23)
CHLORIDE: 99 meq/L (ref 96–112)
CO2: 23 meq/L (ref 19–32)
CREATININE: 0.45 mg/dL — AB (ref 0.50–1.10)
Calcium: 9.6 mg/dL (ref 8.4–10.5)
GFR calc Af Amer: >90 mL/min (ref >90)
GFR calc non Af Amer: >90 mL/min (ref >90)
Glucose, Bld: 230 mg/dL — ABNORMAL HIGH (ref 70–99)
POTASSIUM: 4 meq/L (ref 3.7–5.3)
Sodium: 140 mEq/L (ref 137–147)

## 2013-02-27 LAB — CBC WITH DIFFERENTIAL/PLATELET
Basophils Absolute: 0 10*3/uL (ref 0.0–0.1)
Basophils Relative: 0 % (ref 0–1)
Eosinophils Absolute: 0.1 10*3/uL (ref 0.0–0.7)
Eosinophils Relative: 2 % (ref 0–5)
HEMATOCRIT: 36.1 % (ref 36.0–46.0)
HEMOGLOBIN: 13 g/dL (ref 12.0–15.0)
LYMPHS ABS: 1.4 10*3/uL (ref 0.7–4.0)
LYMPHS PCT: 25 % (ref 12–46)
MCH: 32.1 pg (ref 26.0–34.0)
MCHC: 36 g/dL (ref 30.0–36.0)
MCV: 89.1 fL (ref 78.0–100.0)
MONO ABS: 0.6 10*3/uL (ref 0.1–1.0)
MONOS PCT: 10 % (ref 3–12)
NEUTROS ABS: 3.5 10*3/uL (ref 1.7–7.7)
NEUTROS PCT: 62 % (ref 43–77)
Platelets: 120 10*3/uL — ABNORMAL LOW (ref 150–400)
RBC: 4.05 MIL/uL (ref 3.87–5.11)
RDW: 12.9 % (ref 11.5–15.5)
WBC: 5.7 10*3/uL (ref 4.0–10.5)

## 2013-02-27 LAB — GLUCOSE, CAPILLARY: GLUCOSE-CAPILLARY: 214 mg/dL — AB (ref 70–99)

## 2013-02-27 NOTE — ED Notes (Signed)
Ate dinner and took too much insulin afterwards. Was suppose to take 86 units of levemir, instead took 86 units of humulin R at 2201. Also took 12 units of humulin R at 2030. States, "I don't feel normal, but I feel fine". Denies sx.

## 2013-02-27 NOTE — ED Notes (Signed)
Pt instructed on signs of hypoglycemia and to alert staff. Pt given [place] sandwhich meal, juice and graham cracker and peanut butter

## 2013-02-27 NOTE — ED Notes (Signed)
Pt drew up and injected 12 units of Humalin R  at 2030 prior to eating a large dinner per normal. After dinner pt tooke 86 units of Humalin instead of Leviemir at 2201 Pt denies nasuea. Shaking, dizziness more than baseline (sts has vertigo).  Humalin  takes 30 to 60 minutes to begin working after injection, and has its maximum effect between 2 and 4 hours after injection. It stops working after 6 to 8 hours. Max effect for pt is appx midnight-2 AM

## 2013-02-27 NOTE — ED Provider Notes (Signed)
CSN: [id number]     Arrival date & time 02/27/13  2249 History   First MD Initiated Contact with Patient 02/27/13 2310     Chief Complaint  Patient presents with  . Diabetes  . Drug Overdose   (Consider location/radiation/quality/duration/timing/severity/associated sxs/prior Treatment) HPI Comments: Patient is a 63 year old female with history of insulin-dependent diabetes. She presents this evening after inadvertently administered 86 units of Humulin R instead of the 86 units of Levemir that she was supposed to take. She feels fine and her sugars remain adequate, however she is concerned about what the possible effects of this could be. She has no other complaints.  Patient is a 63 y.o. female presenting with diabetes problem and Overdose. The history is provided by the patient.  Diabetes This is a new problem. The current episode started 1 to 2 hours ago. The problem occurs constantly. The problem has not changed since onset.Pertinent negatives include no chest pain, no abdominal pain, no headaches and no shortness of breath. Nothing aggravates the symptoms. Nothing relieves the symptoms. She has tried nothing for the symptoms. The treatment provided no relief.  Drug Overdose Pertinent negatives include no chest pain, no abdominal pain, no headaches and no shortness of breath.    Past Medical History  Diagnosis Date  . Diabetes mellitus, insulin dependent (IDDM), uncontrolled   . Hyperlipidemia   . Hypertensive heart disease   .  Paroxysmal SVT (ANVRT)     S/p AV nodal ablation Dr. Lovena Le 09/28/08   . Depression   . CAD     Coronary disease status post non-ST elevation MI in May 2010 with  PCI of the left circumflex on Jun 24, 2008. She then had a PCI of  the RCA on July 15, 2008. 02/24/12 Cath, Severe 95% LAD, ostial 95% circ, ostial RCA, patent stents in distal RCA and mid circ normal LV 02/26/12 CABG with LIMA to LAD, SVG to RCA, and SVG to OM Dr. Roxan Hockey     Past Surgical History   Procedure Laterality Date  . Coronary angioplasty with stent placement    . Coronary artery bypass graft  02/26/2012    Roxan Hockey, MD;  Location: [place];  Service: Open Heart Surgery;  Laterality: N/A;  CABG x three,  using left internal mammary artery  and left leg greater saphenous vein,   . Endovein harvest of greater saphenous vein  02/26/2012   No family history on file. History  Substance Use Topics  . Smoking status: Never Smoker   . Smokeless tobacco: Never Used  . Alcohol Use: No   OB History   Grav Para Term Preterm Abortions TAB SAB Ect Mult Living                 Review of Systems  Respiratory: Negative for shortness of breath.   Cardiovascular: Negative for chest pain.  Gastrointestinal: Negative for abdominal pain.  Neurological: Negative for headaches.  All other systems reviewed and are negative.    Allergies  Review of patient's allergies indicates no known allergies.  Home Medications   Current Outpatient Rx  Name  Route  Sig  Dispense  Refill  . aspirin 81 MG tablet   Oral   Take 81 mg by mouth daily.         Marland Kitchen atorvastatin (LIPITOR) 20 MG tablet   Oral   Take 1 tablet (20 mg total) by mouth at bedtime.   90 tablet   3   . Coral Calcium 1000 (390  CA) MG TABS   Oral   Take 1,000 mg by mouth 2 (two) times daily.         . fish oil-omega-3 fatty acids 1000 MG capsule   Oral   Take 2 g by mouth 2 (two) times daily.          . furosemide (LASIX) 40 MG tablet   Oral   Take 1 tablet (40 mg total) by mouth daily.   90 tablet   3   . insulin detemir (LEVEMIR) 100 UNIT/ML injection   Subcutaneous   Inject 86 Units into the skin at bedtime.          . insulin lispro (HUMALOG) 100 UNIT/ML injection   Subcutaneous   Inject 10 Units into the skin 3 (three) times daily before meals.         . metFORMIN (GLUCOPHAGE) 1000 MG tablet   Oral   Take 1,000 mg by mouth 2 (two) times daily with a meal.         . metoprolol tartrate  (LOPRESSOR) 25 MG tablet   Oral   Take 1.5 tablets (37.5 mg total) by mouth 2 (two) times daily.   75 tablet   1   . sertraline (ZOLOFT) 50 MG tablet   Oral   Take 1 tablet (50 mg total) by mouth at bedtime.   30 tablet   3    BP 125/59  Pulse 83  Temp(Src) 98.1 F (36.7 C) (Oral)  Resp 16  Ht 5\' 2"  (1.575 m)  Wt 163 lb 2 oz (73.993 kg)  BMI 29.83 kg/m2  SpO2 97% Physical Exam  Nursing note and vitals reviewed. Constitutional: She is oriented to person, place, and time. She appears well-developed and well-nourished. No distress.  HENT:  Head: Normocephalic and atraumatic.  Mouth/Throat: Oropharynx is clear and moist.  Eyes: EOM are normal. Pupils are equal, round, and reactive to light.  Neck: Normal range of motion. Neck supple.  Cardiovascular: Normal rate and regular rhythm.  Exam reveals no gallop and no friction rub.   No murmur heard. Pulmonary/Chest: Effort normal and breath sounds normal. No respiratory distress. She has no wheezes.  Abdominal: Soft. Bowel sounds are normal. She exhibits no distension. There is no tenderness.  Musculoskeletal: Normal range of motion.  Neurological: She is alert and oriented to person, place, and time. No cranial nerve deficit. She exhibits normal muscle tone. Coordination normal.  Skin: Skin is warm and dry. She is not diaphoretic.    ED Course  Procedures (including critical care time) Labs Review Labs Reviewed  GLUCOSE, CAPILLARY - Abnormal; Notable for the following:    Glucose-Capillary 214 (*)    All other components within normal limits  CBC WITH DIFFERENTIAL  BASIC METABOLIC PANEL   Imaging Review No results found.    MDM  No diagnosis found. Patient observed for 4 hours after the additional dose of Humulin. Her blood sugars have maintained stable and she appears well. I feel as though she is stable for discharge to return as needed for any problems.    Veryl Speak, MD 02/28/13 [phone]

## 2013-02-28 LAB — GLUCOSE, CAPILLARY
Glucose-Capillary: 293 mg/dL — ABNORMAL HIGH (ref 70–99)
Glucose-Capillary: 294 mg/dL — ABNORMAL HIGH (ref 70–99)

## 2013-02-28 NOTE — Discharge Instructions (Signed)
Continue your medications as before.  Return to the emergency department if you develop any new or concerning symptoms.

## 2013-03-04 ENCOUNTER — Ambulatory Visit: Payer: Medicaid Other | Admitting: Family Medicine

## 2013-03-24 ENCOUNTER — Telehealth: Payer: Self-pay | Admitting: Family Medicine

## 2013-03-24 MED ORDER — METFORMIN HCL 1000 MG PO TABS: 1000.0000 mg | ORAL_TABLET | Freq: Two times a day (BID) | ORAL | Status: DC

## 2013-03-24 NOTE — Telephone Encounter (Signed)
RX sent to pharmacy  

## 2013-03-24 NOTE — Telephone Encounter (Signed)
[facility] [place], [place] requesting new script for metFORMIN (GLUCOPHAGE) 1000 MG tablet #60, last filled 02/05/13. (note pharmacy change)

## 2013-04-28 LAB — HM DIABETES EYE EXAM

## 2013-06-03 ENCOUNTER — Encounter: Payer: Self-pay | Admitting: Family Medicine

## 2013-06-03 ENCOUNTER — Ambulatory Visit (INDEPENDENT_AMBULATORY_CARE_PROVIDER_SITE_OTHER): Payer: Medicaid Other | Admitting: Family Medicine

## 2013-06-03 VITALS — BP 130/68 | HR 74 | Temp 98.2°F | Wt 165.0 lb

## 2013-06-03 DIAGNOSIS — I798 Other disorders of arteries, arterioles and capillaries in diseases classified elsewhere: Secondary | ICD-10-CM

## 2013-06-03 DIAGNOSIS — E1159 Type 2 diabetes mellitus with other circulatory complications: Secondary | ICD-10-CM

## 2013-06-03 DIAGNOSIS — I119 Hypertensive heart disease without heart failure: Secondary | ICD-10-CM

## 2013-06-03 DIAGNOSIS — Z1211 Encounter for screening for malignant neoplasm of colon: Secondary | ICD-10-CM

## 2013-06-03 DIAGNOSIS — E785 Hyperlipidemia, unspecified: Secondary | ICD-10-CM

## 2013-06-03 LAB — HEMOGLOBIN A1C: HEMOGLOBIN A1C: 7.1 % — AB (ref 4.6–6.5)

## 2013-06-03 NOTE — Progress Notes (Signed)
Pre visit review using our clinic review tool, if applicable. No additional management support is needed unless otherwise documented below in the visit note. 

## 2013-06-03 NOTE — Progress Notes (Signed)
Subjective:    Patient ID: [name], female    DOB: 1950/03/24, 63 y.o.   MRN: [id number]  HPI Patient seen for routine medical followup. She has history of CAD, hypertension, dyslipidemia, obesity, depression, type 2 diabetes with diabetic retinopathy. She is currently taking Levemir 90 units once daily and takes Humulin R. 20 units 3 times daily. She had a hypoglycemia episode back in January after mixing up her insulins and taking 90 units of Humulin R.  Her blood sugars fasting are still quite elevated ranging from 124-236 over the past couple of weeks. Postprandials are not being checked. Her last A1c was 7.9%.  Denies any recent chest pains. She states is compliant with other medications. She has history of normal renal function.  She had colonoscopy estimated over 10 years ago. Requesting referral for followup. She has had occasional episodes of bright red blood per rectum recently but no appetite or weight changes. No stool changes otherwise  Past Medical History  Diagnosis Date  . Diabetes mellitus, insulin dependent (IDDM), uncontrolled   . Hyperlipidemia   . Hypertensive heart disease   .  Paroxysmal SVT (ANVRT)     S/p AV nodal ablation Dr. Lovena Le 09/28/08   . Depression   . CAD     Coronary disease status post non-ST elevation MI in May 2010 with  PCI of the left circumflex on Jun 24, 2008. She then had a PCI of  the RCA on July 15, 2008. 02/24/12 Cath, Severe 95% LAD, ostial 95% circ, ostial RCA, patent stents in distal RCA and mid circ normal LV 02/26/12 CABG with LIMA to LAD, SVG to RCA, and SVG to OM Dr. Roxan Hockey     Past Surgical History  Procedure Laterality Date  . Coronary angioplasty with stent placement    . Coronary artery bypass graft  02/26/2012    Roxan Hockey, MD;  Location: [place];  Service: Open Heart Surgery;  Laterality: N/A;  CABG x three,  using left internal mammary artery  and left leg greater saphenous vein,   . Endovein harvest of greater  saphenous vein  02/26/2012    reports that she has never smoked. She has never used smokeless tobacco. She reports that she does not drink alcohol or use illicit drugs. family history is not on file. No Known Allergies    Review of Systems  Constitutional: Negative for fatigue.  Eyes: Negative for visual disturbance.  Respiratory: Negative for cough, chest tightness, shortness of breath and wheezing.   Cardiovascular: Negative for chest pain, palpitations and leg swelling.  Endocrine: Negative for polydipsia and polyuria.  Neurological: Negative for dizziness, seizures, syncope, weakness, light-headedness and headaches.       Objective:   Physical Exam  Constitutional: She appears well-developed and well-nourished.  Neck: Neck supple. No thyromegaly present.  Cardiovascular: Normal rate and regular rhythm.   Pulmonary/Chest: Effort normal and breath sounds normal. No respiratory distress. She has no wheezes. She has no rales.  Musculoskeletal: She exhibits no edema.  Skin:  Feet reveal no skin lesions. Good distal foot pulses. Good capillary refill. No calluses. Normal sensation with monofilament testing           Assessment & Plan:  #1 type 2 diabetes. History of fair control. She still is elevated fastings recently. Recheck A1c. Consider addition of SG- T2 inhibitor if still elevated since she has normal renal function  #2 hypertension stable and at goal #3 dyslipidemia. Continue atorvastatin #4 history of CAD. No recent chest  pains. She still is poor compliance with diet and had some recent weight gain #5 health maintenance.  Set up GI referral for repeat colonoscopy- she states last > 10 years.

## 2013-06-08 ENCOUNTER — Telehealth: Payer: Self-pay

## 2013-06-08 NOTE — Telephone Encounter (Signed)
Relevant patient education mailed to patient.  

## 2013-06-10 ENCOUNTER — Encounter: Payer: Self-pay | Admitting: Internal Medicine

## 2013-08-12 ENCOUNTER — Encounter: Payer: Medicaid Other | Admitting: Internal Medicine

## 2013-08-25 ENCOUNTER — Telehealth: Payer: Self-pay | Admitting: Family Medicine

## 2013-08-25 NOTE — Telephone Encounter (Signed)
Patient does not remember the medication, pt stated that she was given a Rx written out.

## 2013-08-25 NOTE — Telephone Encounter (Signed)
We had previously discussed change from Novolin insulin to NOVOLOG insulin. If we make that  Change, would take 5 units with each meal.  She would still remain on all of her other meds.

## 2013-08-25 NOTE — Telephone Encounter (Signed)
Pt states that dr. Elease Hashimoto informed her that he wanted her to change her insulin medicine because she needed meds that would work fast for her diabetes. Pt states he gave her prescription for [facility]. Pt does not remember the name of the meds, and would like for dr. Elease Hashimoto to send it to [facility] .

## 2013-08-26 ENCOUNTER — Other Ambulatory Visit: Payer: Self-pay

## 2013-08-26 MED ORDER — INSULIN ASPART 100 UNIT/ML ~~LOC~~ SOLN: SUBCUTANEOUS | Status: DC

## 2013-08-26 NOTE — Telephone Encounter (Signed)
Pt informed and Rx is sent to pharmacy

## 2013-10-04 ENCOUNTER — Ambulatory Visit (INDEPENDENT_AMBULATORY_CARE_PROVIDER_SITE_OTHER): Payer: Medicaid Other | Admitting: Family Medicine

## 2013-10-04 ENCOUNTER — Encounter: Payer: Self-pay | Admitting: Family Medicine

## 2013-10-04 VITALS — BP 130/70 | HR 72 | Temp 98.1°F | Wt 156.0 lb

## 2013-10-04 DIAGNOSIS — I798 Other disorders of arteries, arterioles and capillaries in diseases classified elsewhere: Secondary | ICD-10-CM

## 2013-10-04 DIAGNOSIS — E785 Hyperlipidemia, unspecified: Secondary | ICD-10-CM

## 2013-10-04 DIAGNOSIS — S90222A Contusion of left lesser toe(s) with damage to nail, initial encounter: Secondary | ICD-10-CM

## 2013-10-04 DIAGNOSIS — E1159 Type 2 diabetes mellitus with other circulatory complications: Secondary | ICD-10-CM

## 2013-10-04 DIAGNOSIS — S90129A Contusion of unspecified lesser toe(s) without damage to nail, initial encounter: Secondary | ICD-10-CM

## 2013-10-04 LAB — HM DIABETES FOOT EXAM: HM Diabetic Foot Exam: NORMAL

## 2013-10-04 LAB — HEMOGLOBIN A1C: HEMOGLOBIN A1C: 6.7 % — AB (ref 4.6–6.5)

## 2013-10-04 NOTE — Progress Notes (Signed)
Pre visit review using our clinic review tool, if applicable. No additional management support is needed unless otherwise documented below in the visit note. 

## 2013-10-04 NOTE — Progress Notes (Signed)
   Subjective:    Patient ID: [name], female    DOB: 1950-05-20, 63 y.o.   MRN: [id number]  HPI Medical followup  Type 2 diabetes. Most recent A1c 7.1%. She remains on combination treatment with metformin along with insulin. No recent hypoglycemia. No symptoms of hyperglycemia. She does have history of diabetic retinopathy and she sees ophthalmologist regularly every month.  Patient has new problem of left great toe reveals some discoloration along the proximal portion. She does not recall any history of injury. Nontender. She does ambulate a lot on feet.  Hyperlipidemia treated with Lipitor. She does have history of CAD and remote history of bypass graft. No chest pains. She is compliant with Lipitor and aspirin therapy.  Past Medical History  Diagnosis Date  . Diabetes mellitus, insulin dependent (IDDM), uncontrolled   . Hyperlipidemia   . Hypertensive heart disease   .  Paroxysmal SVT (ANVRT)     S/p AV nodal ablation Dr. Lovena Le 09/28/08   . Depression   . CAD     Coronary disease status post non-ST elevation MI in May 2010 with  PCI of the left circumflex on Jun 24, 2008. She then had a PCI of  the RCA on July 15, 2008. 02/24/12 Cath, Severe 95% LAD, ostial 95% circ, ostial RCA, patent stents in distal RCA and mid circ normal LV 02/26/12 CABG with LIMA to LAD, SVG to RCA, and SVG to OM Dr. Roxan Hockey     Past Surgical History  Procedure Laterality Date  . Coronary angioplasty with stent placement    . Coronary artery bypass graft  02/26/2012    Roxan Hockey, MD;  Location: [place];  Service: Open Heart Surgery;  Laterality: N/A;  CABG x three,  using left internal mammary artery  and left leg greater saphenous vein,   . Endovein harvest of greater saphenous vein  02/26/2012    reports that she has never smoked. She has never used smokeless tobacco. She reports that she does not drink alcohol or use illicit drugs. family history is not on file. No Known Allergies    Review of  Systems  Constitutional: Negative for fatigue.  Eyes: Negative for visual disturbance.  Respiratory: Negative for cough, chest tightness, shortness of breath and wheezing.   Cardiovascular: Negative for chest pain, palpitations and leg swelling.  Endocrine: Negative for polydipsia and polyuria.  Neurological: Negative for dizziness, seizures, syncope, weakness, light-headedness and headaches.       Objective:   Physical Exam  Constitutional: She appears well-developed and well-nourished.  Neck: Neck supple. No thyromegaly present.  Cardiovascular: Normal rate and regular rhythm.  Exam reveals no gallop.   Pulmonary/Chest: Effort normal and breath sounds normal. No respiratory distress. She has no wheezes. She has no rales.  Musculoskeletal: She exhibits no edema.  Skin:  Left great toe reveals somewhat concave shape purplish discoloration along the proximal aspect of the nail. Nontender. No skin changes. Feet reveal no lesions.          Assessment & Plan:  #1 type 2 diabetes. Blood sugars have shown improved control during the past year. No recent hypoglycemia. Recheck A1c today. Continue regular eye exams #2 dyslipidemia. Repeat lipids at followup in 2 months. Continue Lipitor #3 discoloration left great toe nail. Very likely related to subungual hematoma. Nonpainful and no signs of secondary infection. Recheck at follow up.

## 2013-10-18 ENCOUNTER — Telehealth: Payer: Self-pay

## 2013-10-18 NOTE — Telephone Encounter (Signed)
A1c improved and at goal at 6.7%

## 2013-10-18 NOTE — Telephone Encounter (Signed)
Lab results on 10/04/13

## 2013-10-18 NOTE — Telephone Encounter (Signed)
Pt informed

## 2013-12-02 ENCOUNTER — Ambulatory Visit (INDEPENDENT_AMBULATORY_CARE_PROVIDER_SITE_OTHER): Payer: Medicaid Other | Admitting: Family Medicine

## 2013-12-02 ENCOUNTER — Encounter: Payer: Self-pay | Admitting: Family Medicine

## 2013-12-02 VITALS — BP 128/64 | HR 72 | Temp 98.3°F | Wt 160.0 lb

## 2013-12-02 DIAGNOSIS — Z23 Encounter for immunization: Secondary | ICD-10-CM

## 2013-12-02 DIAGNOSIS — E785 Hyperlipidemia, unspecified: Secondary | ICD-10-CM

## 2013-12-02 DIAGNOSIS — E1159 Type 2 diabetes mellitus with other circulatory complications: Secondary | ICD-10-CM

## 2013-12-02 DIAGNOSIS — M7022 Olecranon bursitis, left elbow: Secondary | ICD-10-CM

## 2013-12-02 DIAGNOSIS — S90222D Contusion of left lesser toe(s) with damage to nail, subsequent encounter: Secondary | ICD-10-CM

## 2013-12-02 DIAGNOSIS — E1151 Type 2 diabetes mellitus with diabetic peripheral angiopathy without gangrene: Secondary | ICD-10-CM

## 2013-12-02 NOTE — Progress Notes (Signed)
Pre visit review using our clinic review tool, if applicable. No additional management support is needed unless otherwise documented below in the visit note. 

## 2013-12-02 NOTE — Progress Notes (Signed)
Subjective:    Patient ID: [name], female    DOB: Mar 27, 1950, 63 y.o.   MRN: [id number]  HPI 63 year old female with history of DM presents today to follow up on discoloration of left great toe nail.  On presentation 2 months ago, the base of the nail horizontally was discolored black.  It is now still discolored but growing out with the nail.  New clear nail is now visible at the nailbed.  No associated pain, trauma to nail or damage.   She also admits to 3 wks left elbow bursitis.  Has not gotten any bigger.  Denies warmth, erythema or fevers.  Not painful.  No injury that she can recall.  Feels "full of fluid." Her diabetic sugars range 157-205 at home.  She states that she is a "bad diabetic."  Diet has a lot of starch and she knows she should cut back.  Denies hypoglycemic events.     Patient got her flu shot today.   Past Medical History  Diagnosis Date  . Diabetes mellitus, insulin dependent (IDDM), uncontrolled   . Hyperlipidemia   . Hypertensive heart disease   .  Paroxysmal SVT (ANVRT)     S/p AV nodal ablation Dr. Lovena Le 09/28/08   . Depression   . CAD     Coronary disease status post non-ST elevation MI in May 2010 with  PCI of the left circumflex on Jun 24, 2008. She then had a PCI of  the RCA on July 15, 2008. 02/24/12 Cath, Severe 95% LAD, ostial 95% circ, ostial RCA, patent stents in distal RCA and mid circ normal LV 02/26/12 CABG with LIMA to LAD, SVG to RCA, and SVG to OM Dr. Roxan Hockey     Past Surgical History  Procedure Laterality Date  . Coronary angioplasty with stent placement    . Coronary artery bypass graft  02/26/2012    Roxan Hockey, MD;  Location: [place];  Service: Open Heart Surgery;  Laterality: N/A;  CABG x three,  using left internal mammary artery  and left leg greater saphenous vein,   . Endovein harvest of greater saphenous vein  02/26/2012    reports that she has never smoked. She has never used smokeless tobacco. She reports that she does not  drink alcohol or use illicit drugs. family history is not on file. No Known Allergies    Review of Systems  Constitutional: Negative for fever, activity change and appetite change.  HENT: Negative.        Chronic sinus issues, no changes from baseline.  Decreased hearing from wax.  Eyes: Negative for visual disturbance.  Respiratory: Negative for cough, shortness of breath and wheezing.   Cardiovascular: Negative for chest pain and leg swelling.  Gastrointestinal: Negative for nausea, vomiting, abdominal pain, diarrhea and constipation.  Musculoskeletal: Positive for joint swelling (No pain in left elblow.).  Neurological: Negative for dizziness, weakness, numbness and headaches.  Psychiatric/Behavioral: Negative.        Objective:   Physical Exam  Nursing note and vitals reviewed. Constitutional: She is oriented to person, place, and time. She appears well-developed and well-nourished. No distress.  HENT:  Head: Normocephalic and atraumatic.  Right Ear: External ear normal.  Left Ear: External ear normal.  Neck: Normal range of motion. Neck supple. No JVD present. No tracheal deviation present. No thyromegaly present.  Cardiovascular: Normal rate and regular rhythm.   Murmur (Holosystolic.  She is already aware.) heard. Pulmonary/Chest: Effort normal and breath sounds normal. No  stridor. No respiratory distress. She has no wheezes.  Musculoskeletal: Normal range of motion. She exhibits edema (fluid collection over left ollecranon. ).  Lymphadenopathy:    She has no cervical adenopathy.  Neurological: She is alert and oriented to person, place, and time.  Skin: Skin is warm and dry. No rash noted. She is not diaphoretic. No erythema. No pallor.  Left toenail is growing out.  Still dark black discoloration horizontally across nail, but no longer against the nail bed.  Psychiatric: She has a normal mood and affect. Her behavior is normal. Judgment and thought content normal.           Assessment & Plan:  1. DM- Sugars have been more controlled over the past year.  Denies episodes of hypoglycemia.  Continue to monitor at home.  Work on improving diet by decreasing amounts of starch and carbohydrates.  A1C two months ago was 6.7; will recheck next month.  Continue current medications. 2. Left toe nail discoloration- resolving, denies recollection of trauma.  Continue to watch the nail.  It should grow out as the nail does and can then be clipped off.  Biopsy at this point is unnecessary, since it has shown to grow out. 3. Left olecranon bursitis- small effusion over the left bursa.  Denies trauma.  Has not changed in size and causes no discomfort.  Agreed aspiration is not necessary at this time since it would likely recur.  She will keep the elbow off of hard surfaces and let it resolve.  4. Hyperlipidemia-  Will recheck lipid panel next month with A1C.  She was not fasting today, and wished to wait until next visit.  Continue Lipitor.   Joseph Art, PA-S  Agree with above.  New nail growth at base confirms this was a subungual hematoma.   Bruce Burchette M.D.

## 2013-12-18 ENCOUNTER — Other Ambulatory Visit: Payer: Self-pay | Admitting: Family Medicine

## 2013-12-20 ENCOUNTER — Telehealth: Payer: Self-pay | Admitting: Family Medicine

## 2013-12-20 ENCOUNTER — Other Ambulatory Visit: Payer: Self-pay | Admitting: Family Medicine

## 2013-12-20 DIAGNOSIS — Z1211 Encounter for screening for malignant neoplasm of colon: Secondary | ICD-10-CM

## 2013-12-20 NOTE — Telephone Encounter (Signed)
Referral is ordered

## 2013-12-20 NOTE — Telephone Encounter (Signed)
Pt missed her apt to Dr Hilarie Fredrickson and would like another referral . Pls advise

## 2013-12-20 NOTE — Telephone Encounter (Signed)
OK 

## 2013-12-27 ENCOUNTER — Telehealth: Payer: Self-pay | Admitting: Family Medicine

## 2013-12-27 NOTE — Telephone Encounter (Signed)
I called [facility] spoke with amy she states they will contact directly  And wanted to know if  It was in their work que , I  called  the pt to inform that [facility] will contact her to schedule pt  Stated ok .and [facility] schedule is a bit out per amy , and pt is aware of this

## 2013-12-27 NOTE — Telephone Encounter (Signed)
Pt calling to check the status of referral to GI, pt states she previosly declined the referral but now wants to go see Dr. Hilarie Fredrickson.  Please follow up with patient regarding status of referral.

## 2014-01-06 ENCOUNTER — Other Ambulatory Visit (INDEPENDENT_AMBULATORY_CARE_PROVIDER_SITE_OTHER): Payer: Medicaid Other

## 2014-01-06 DIAGNOSIS — E1151 Type 2 diabetes mellitus with diabetic peripheral angiopathy without gangrene: Secondary | ICD-10-CM

## 2014-01-06 DIAGNOSIS — E1159 Type 2 diabetes mellitus with other circulatory complications: Secondary | ICD-10-CM

## 2014-01-06 DIAGNOSIS — E785 Hyperlipidemia, unspecified: Secondary | ICD-10-CM

## 2014-01-06 LAB — LIPID PANEL
Cholesterol: 119 mg/dL (ref 0–200)
HDL: 29 mg/dL — ABNORMAL LOW (ref >39.00)
LDL Cholesterol: 67 mg/dL (ref 0–99)
NONHDL: 90
Total CHOL/HDL Ratio: 4
Triglycerides: 114 mg/dL (ref 0.0–149.0)
VLDL: 22.8 mg/dL (ref 0.0–40.0)

## 2014-01-06 LAB — HEMOGLOBIN A1C: HEMOGLOBIN A1C: 7.5 % — AB (ref 4.6–6.5)

## 2014-01-13 ENCOUNTER — Encounter (HOSPITAL_COMMUNITY): Payer: Self-pay | Admitting: Cardiology

## 2014-02-04 ENCOUNTER — Other Ambulatory Visit: Payer: Self-pay | Admitting: Family Medicine

## 2014-02-11 ENCOUNTER — Ambulatory Visit (AMBULATORY_SURGERY_CENTER): Payer: Self-pay | Admitting: *Deleted

## 2014-02-11 VITALS — Ht 62.0 in | Wt 159.8 lb

## 2014-02-11 DIAGNOSIS — K921 Melena: Secondary | ICD-10-CM

## 2014-02-11 MED ORDER — MOVIPREP 100 G PO SOLR: ORAL | Status: DC

## 2014-02-11 NOTE — Progress Notes (Signed)
No allergies to eggs or soy. No problems with anesthesia.  Pt given Emmi instructions for colonoscopy  No oxygen use  No diet drug use  

## 2014-02-25 ENCOUNTER — Encounter: Payer: Medicaid Other | Admitting: Gastroenterology

## 2014-02-28 ENCOUNTER — Encounter: Payer: Self-pay | Admitting: Cardiology

## 2014-02-28 ENCOUNTER — Other Ambulatory Visit: Payer: Self-pay | Admitting: Cardiology

## 2014-02-28 NOTE — Progress Notes (Signed)
Patient ID: [name], female   DOB: Jul 05, 1950, 64 y.o.   MRN: [id number]   [name], [name]    Date of visit:  02/28/2014 DOB:  [birth year]    Age:  64 yrs. Medical record number:  [id number]     Account number:  [id number] Primary Care Provider: Carolann Littler ____________________________ CURRENT DIAGNOSES  1. Atherosclerotic heart disease of native coronary artery without angina pectoris  2. Essential (primary) hypertension  3. Hyperlipidemia, unspecified  4. Type 2 diabetes mellitus with proliferative diabetic retinopathy without macular edema  5. Overweight  6. Presence of aortocoronary bypass graft ____________________________ ALLERGIES  No Known Drug Allergies ____________________________ MEDICATIONS  1. Fish Oil 1,000 mg capsule, 2 bid  2. Calcium 600 + D(3) 600-125 mg-unit tablet, BID  3. Zoloft 50 mg tablet, QHS  4. metformin 1,000 mg tablet, BID  5. aspirin 81 mg tablet,chewable, 1 p.o. daily  6. atorvastatin 20 mg tablet, 1 p.o. daily  7. furosemide 40 mg tablet, 1 p.o. daily  8. Levemir 100 unit/mL subcutaneous solution, 96u qpm  9. Novolog 100 unit/mL subcutaneous solution, 10u ac  10. metoprolol tartrate 25 mg tablet, BID ____________________________ CHIEF COMPLAINTS  Followup of Atherosclerotic heart disease of native coronary artery without angina pectoris ____________________________ HISTORY OF PRESENT ILLNESS  Patient seen for cardiac followup. She has been doing well since she was previously here. She denies angina and has no PND, orthopnea, syncope, palpitations, or claudication. Her lipids were reviewed today and are under excellent control. Diabetic control remains fair. She has had some retinal hemorrhage since she was here. She is currently under treatment for this. ____________________________ PAST HISTORY  Past Medical Illnesses:  hypertension, DM-insulin dependent, hyperlipidemia, obesity;  Cardiovascular Illnesses:  CAD, arrhythmia-PSVT, atrial  fibrillation;  Surgical Procedures:  CABG;  NYHA Classification:  I;  Canadian Angina Classification:  Class 0: Asymptomatic;  Cardiology Procedures-Invasive:  cardiac cath (left) January 2014, stent June 2010, AV nodal ablation May 2010, CABG with LIMA to LAD, SVG to OM, SVG to RCA Dr. Roxan Hockey;  Cardiac Cath Results:  normal Left main, 90% stenosis proximal LAD, 70% stenosis proximal CFX, 95% stenosis RCA;  LVEF of 60% documented via cardiac cath on 02/24/2012,   ____________________________ CARDIO-PULMONARY TEST DATES EKG Date:  02/28/2014;   Cardiac Cath Date:  02/24/2012;  Chest Xray Date: 03/24/2012;   ____________________________ FAMILY HISTORY Brother -- Brother alive with problem, Coronary Artery Disease, Dementia/Alzheimers Father -- Congestive heart failure, Pulmonary emphysema, Father dead Mother -- Mother dead, Unknown disease Sister -- Sister alive with problem, Diabetes mellitus ____________________________ SOCIAL HISTORY Alcohol Use:  no alcohol use;  Smoking:  never smoked;  Diet:  regular diet;  Lifestyle:  divorced;  Exercise:  walking;  Occupation:  retired;  Residence:  lives alone;   ____________________________ REVIEW OF SYSTEMS General:  obesity, weight gain of approximately 5 lbs  Integumentary:no rashes or new skin lesions. Eyes: wears eye glasses/contact lenses Respiratory: denies dyspnea, cough, wheezing or hemoptysis. Cardiovascular:  please review HPI Psychiatric:  anxiety  ____________________________ PHYSICAL EXAMINATION VITAL SIGNS  Blood Pressure:  142/60 Sitting, Right arm, regular cuff  , 140/62 Standing, Right arm and regular cuff   Pulse:  88/min. Weight:  161.00 lbs. Height:  62"BMI: 29  Constitutional:  pleasant white female, in no acute distress Skin:  warm and dry to touch, no apparent skin lesions, or masses noted. Head:  normocephalic, normal hair pattern, no masses or tenderness Neck:  supple, without massess. No JVD, thyromegaly or carotid  bruits. Carotid upstroke  normal. Chest:  normal symmetry, clear to auscultation, healed median sternotomy scar Cardiac:  regular rhythm, normal S1 and S2, No S3 or S4, no murmurs, gallops or rubs detected. Peripheral Pulses:  the femoral,dorsalis pedis, and posterior tibial pulses are full and equal bilaterally with no bruits auscultated. Extremities & Back:  well healed saphenous vein donor site LLE, no edema present Neurological:  no gross motor or sensory deficits noted, affect appropriate, oriented x3. ____________________________ MOST RECENT LIPID PANEL 01/06/14  CHOL TOTL 119 mg/dl, LDL 67 NM, HDL 29 mg/dl, TRIGLYCER 114 mg/dl and CHOL/HDL 4 (Calc) ____________________________ IMPRESSIONS/PLAN  1. Coronary artery disease with previous bypass grafting with no angina 2. Hypertension borderline control currently out of her Toprol all 3. Hyperlipidemia good control 4. Overweight with need to lose additional weight 5. Diabetes mellitus with retinopathy  Recommendations:  Followup in one year and call if problems. Discussed importance of regular exercise and weight loss.EKG shows poor R wave progression, LAD, and nonspecific changes. ____________________________ Cleda Clarks  1. 12 Lead EKG: Today                       ____________________________ Cardiology Physician:  Kerry Hough MD [facility]

## 2014-03-03 ENCOUNTER — Other Ambulatory Visit: Payer: Medicaid Other

## 2014-03-04 ENCOUNTER — Ambulatory Visit (INDEPENDENT_AMBULATORY_CARE_PROVIDER_SITE_OTHER): Payer: Medicaid Other | Admitting: Family Medicine

## 2014-03-04 ENCOUNTER — Encounter: Payer: Self-pay | Admitting: Family Medicine

## 2014-03-04 VITALS — BP 138/68 | HR 74 | Temp 97.8°F | Wt 162.0 lb

## 2014-03-04 DIAGNOSIS — E1159 Type 2 diabetes mellitus with other circulatory complications: Secondary | ICD-10-CM

## 2014-03-04 DIAGNOSIS — Z8659 Personal history of other mental and behavioral disorders: Secondary | ICD-10-CM

## 2014-03-04 DIAGNOSIS — E785 Hyperlipidemia, unspecified: Secondary | ICD-10-CM | POA: Diagnosis not present

## 2014-03-04 DIAGNOSIS — E1151 Type 2 diabetes mellitus with diabetic peripheral angiopathy without gangrene: Secondary | ICD-10-CM

## 2014-03-04 NOTE — Progress Notes (Signed)
Pre visit review using our clinic review tool, if applicable. No additional management support is needed unless otherwise documented below in the visit note. 

## 2014-03-04 NOTE — Progress Notes (Signed)
Subjective:    Patient ID: [name], female    DOB: [birth year], 64 y.o.   MRN: [id number]  HPI   Follow-up type 2 diabetes. Poor compliance with diet- especially snacking at night. She is currently on Levemir 96 units at night and NovoLog 10 units with each meal. Fastings vary considerably and seem to correlate very well with her dietary compliance. No recent hypoglycemia. No chest pains. Other medications reviewed and stable. Her blood pressures have been stable. No headaches. No dizziness. She remains on atorvastatin for hyperlipidemia. Recent lipids were reviewed with patient and at goal. Her triglycerides are now normal  Past Medical History  Diagnosis Date  . Diabetes mellitus, insulin dependent (IDDM), uncontrolled   . Hyperlipidemia   . Hypertensive heart disease   .  Paroxysmal SVT (ANVRT)     S/p AV nodal ablation Dr. Lovena Le 09/28/08   . Depression   . CAD     Coronary disease status post non-ST elevation MI in May 2010 with  PCI of the left circumflex on Jun 24, 2008. She then had a PCI of  the RCA on July 15, 2008. 02/24/12 Cath, Severe 95% LAD, ostial 95% circ, ostial RCA, patent stents in distal RCA and mid circ normal LV 02/26/12 CABG with LIMA to LAD, SVG to RCA, and SVG to OM Dr. Roxan Hockey    . Cataract    Past Surgical History  Procedure Laterality Date  . Coronary angioplasty with stent placement    . Coronary artery bypass graft  02/26/2012    Roxan Hockey, MD;  Location: [place];  Service: Open Heart Surgery;  Laterality: N/A;  CABG x three,  using left internal mammary artery  and left leg greater saphenous vein,   . Endovein harvest of greater saphenous vein  02/26/2012  . Left heart catheterization with coronary angiogram N/A 02/24/2012    Procedure: LEFT HEART CATHETERIZATION WITH CORONARY ANGIOGRAM;  Surgeon: Jacolyn Reedy, MD;  Location: [facility] CATH LAB;  Service: Cardiovascular;  Laterality: N/A;    reports that she has never smoked. She has never used smokeless  tobacco. She reports that she does not drink alcohol or use illicit drugs. family history is negative for Colon cancer. No Known Allergies    Review of Systems  Constitutional: Negative for fatigue.  Eyes: Negative for visual disturbance.  Respiratory: Negative for cough, chest tightness, shortness of breath and wheezing.   Cardiovascular: Negative for chest pain, palpitations and leg swelling.  Neurological: Negative for dizziness, seizures, syncope, weakness, light-headedness and headaches.       Objective:   Physical Exam  Constitutional: She is oriented to person, place, and time. She appears well-developed and well-nourished.  HENT:  Mouth/Throat: Oropharynx is clear and moist.  Neck: Neck supple. No JVD present.  Cardiovascular: Normal rate and regular rhythm.   Pulmonary/Chest: Effort normal and breath sounds normal. No respiratory distress. She has no wheezes. She has no rales.  Musculoskeletal: She exhibits no edema.  Neurological: She is alert and oriented to person, place, and time.          Assessment & Plan:  #1 type 2 diabetes. History of poor compliance. Recent A1c 7.5%. Tighten up dietary compliance and we'll plan repeat A1c in 3 months. We elected not to make any changes in medication this time. We discussed briefly potential for other medications such as Invokana but at this point prefer improved compliance first #2 dyslipidemia. Recent labs reviewed. Continue atorvastatin  # 3 history of depression. Currently  stable on sertraline.

## 2014-03-09 ENCOUNTER — Other Ambulatory Visit: Payer: Self-pay | Admitting: Family Medicine

## 2014-03-11 ENCOUNTER — Ambulatory Visit (AMBULATORY_SURGERY_CENTER): Payer: Commercial Managed Care - HMO | Admitting: Gastroenterology

## 2014-03-11 ENCOUNTER — Encounter: Payer: Self-pay | Admitting: Gastroenterology

## 2014-03-11 VITALS — BP 137/69 | HR 79 | Temp 98.6°F | Resp 17 | Ht 62.0 in | Wt 159.0 lb

## 2014-03-11 DIAGNOSIS — K6289 Other specified diseases of anus and rectum: Secondary | ICD-10-CM

## 2014-03-11 DIAGNOSIS — K635 Polyp of colon: Secondary | ICD-10-CM | POA: Diagnosis not present

## 2014-03-11 DIAGNOSIS — Z951 Presence of aortocoronary bypass graft: Secondary | ICD-10-CM | POA: Diagnosis not present

## 2014-03-11 DIAGNOSIS — R198 Other specified symptoms and signs involving the digestive system and abdomen: Secondary | ICD-10-CM

## 2014-03-11 DIAGNOSIS — K921 Melena: Secondary | ICD-10-CM

## 2014-03-11 DIAGNOSIS — K625 Hemorrhage of anus and rectum: Secondary | ICD-10-CM | POA: Diagnosis not present

## 2014-03-11 DIAGNOSIS — F329 Major depressive disorder, single episode, unspecified: Secondary | ICD-10-CM | POA: Diagnosis not present

## 2014-03-11 DIAGNOSIS — I2510 Atherosclerotic heart disease of native coronary artery without angina pectoris: Secondary | ICD-10-CM | POA: Diagnosis not present

## 2014-03-11 DIAGNOSIS — D122 Benign neoplasm of ascending colon: Secondary | ICD-10-CM

## 2014-03-11 DIAGNOSIS — I471 Supraventricular tachycardia: Secondary | ICD-10-CM | POA: Diagnosis not present

## 2014-03-11 DIAGNOSIS — D125 Benign neoplasm of sigmoid colon: Secondary | ICD-10-CM | POA: Diagnosis not present

## 2014-03-11 DIAGNOSIS — E119 Type 2 diabetes mellitus without complications: Secondary | ICD-10-CM | POA: Diagnosis not present

## 2014-03-11 LAB — GLUCOSE, CAPILLARY
GLUCOSE-CAPILLARY: 183 mg/dL — AB (ref 70–99)
GLUCOSE-CAPILLARY: 184 mg/dL — AB (ref 70–99)

## 2014-03-11 MED ORDER — SODIUM CHLORIDE 0.9 % IV SOLN: 500.0000 mL | INTRAVENOUS | Status: DC

## 2014-03-11 NOTE — Op Note (Signed)
[place]  [address] [place], [id number]   COLONOSCOPY PROCEDURE REPORT PATIENT: [name], [name]  MR#: [id number] BIRTHDATE: Nov 23, 1950 , 74  yrs. old GENDER: female ENDOSCOPIST: Ladene Artist, MD, [facility] REFERRED MG:[id number] Elease Hashimoto, M.D. PROCEDURE DATE:  03/11/2014 PROCEDURE:   Colonoscopy with biopsy, Colonoscopy with snare polypectomy, and Submucosal injection, any substance First Screening Colonoscopy - Avg.  risk and is 50 yrs.  old or older - No.  Prior Negative Screening - Now for repeat screening. N/A  History of Adenoma - Now for follow-up colonoscopy & has been > or = to 3 yrs.  N/A  Polyps Removed Today? Yes. ASA CLASS:   Class II INDICATIONS:hematochezia. MEDICATIONS: Monitored anesthesia care, Propofol 450 mg IV, and lidocaine 40 mg IV DESCRIPTION OF PROCEDURE:   After the risks benefits and alternatives of the procedure were thoroughly explained, informed consent was obtained.  The digital rectal exam revealed no abnormalities of the rectum.   The [facility] [id number]  endoscope was introduced through the anus and advanced to the cecum, which was identified by both the appendix and ileocecal valve. No adverse events experienced.   The quality of the prep was adequate, using MoviPrep  The instrument was then slowly withdrawn as the colon was fully examined.  COLON FINDINGS: A sessile polyp measuring 18 mm in size was found in the ascending colon. It was not removed due to the adjacent large polyp whcih will require surgical resection.  A sessile polyp measuring 40 mm in size was found in the ascending colon.  Multiple biopsies were performed.  Injection (tattooing) was performed. It was not amenable to endoscopic  removal. A sessile polyp measuring 6 mm in size was found in the sigmoid colon.  A polypectomy was performed with a cold snare.  The resection was complete, the polyp tissue was completely retrieved and sent to histology.   A  half circumferential firm mass, measuring 4 X 3cm in size, was found in the rectum. It extended from 7-11 cm in the mid rectum.  Multiple biopsies of the lesion were performed.   The examination was otherwise normal.  Retroflexed views revealed no abnormalities. The time to cecum=4 minutes 28 seconds.  Withdrawal time=19 minutes 30 seconds.  The scope was withdrawn and the procedure completed. COMPLICATIONS: There were no immediate complications.  ENDOSCOPIC IMPRESSION: 1.   Sessile polyp in the ascending colon-not removed 2.   Sessile polyp in the ascending colon; not removed; multiple biopsies performed; Injection (tattooing) performed 3.   Sessile polyp in the sigmoid colon; polypectomy performed with a cold snare 4.   Rectal mass, measuring 4 X 3 cm; multiple biopsies of the lesion performed  RECOMMENDATIONS: 1.  Await pathology results 2.  My office will arrange for you to have a CT scan of chest/abdomen/pelvis. 3.  My office will arrange for you to meet with a surgeon. 4.  Repeat Colonoscopy in 1 year with a more extensive bowel prep  eSigned:  Ladene Artist, MD, [facility] 03/11/2014 12:02 PM

## 2014-03-11 NOTE — Progress Notes (Signed)
Stable to RR 

## 2014-03-11 NOTE — Patient Instructions (Signed)
YOU HAD AN ENDOSCOPIC PROCEDURE TODAY AT THE [facility]: Refer to the procedure report that was given to you for any specific questions about what was found during the examination.  If the procedure report does not answer your questions, please call your gastroenterologist to clarify.  If you requested that your care partner not be given the details of your procedure findings, then the procedure report has been included in a sealed envelope for you to review at your convenience later.  YOU SHOULD EXPECT: Some feelings of bloating in the abdomen. Passage of more gas than usual.  Walking can help get rid of the air that was put into your GI tract during the procedure and reduce the bloating. If you had a lower endoscopy (such as a colonoscopy or flexible sigmoidoscopy) you may notice spotting of blood in your stool or on the toilet paper. If you underwent a bowel prep for your procedure, then you may not have a normal bowel movement for a few days.  DIET: Your first meal following the procedure should be a light meal and then it is ok to progress to your normal diet.  A half-sandwich or bowl of soup is an example of a good first meal.  Heavy or fried foods are harder to digest and may make you feel nauseous or bloated.  Likewise meals heavy in dairy and vegetables can cause extra gas to form and this can also increase the bloating.  Drink plenty of fluids but you should avoid alcoholic beverages for 24 hours.  ACTIVITY: Your care partner should take you home directly after the procedure.  You should plan to take it easy, moving slowly for the rest of the day.  You can resume normal activity the day after the procedure however you should NOT DRIVE or use heavy machinery for 24 hours (because of the sedation medicines used during the test).    SYMPTOMS TO REPORT IMMEDIATELY: A gastroenterologist can be reached at any hour.  During normal business hours, 8:30 AM to 5:00 PM Monday through Friday,  call [phone].  After hours and on weekends, please call the GI answering service at [phone] who will take a message and have the physician on call contact you.   Following lower endoscopy (colonoscopy or flexible sigmoidoscopy):  Excessive amounts of blood in the stool  Significant tenderness or worsening of abdominal pains  Swelling of the abdomen that is new, acute  Fever of 100F or higher  FOLLOW UP: If any biopsies were taken you will be contacted by phone or by letter within the next 1-3 weeks.  Call your gastroenterologist if you have not heard about the biopsies in 3 weeks.  Our staff will call the home number listed on your records the next business day following your procedure to check on you and address any questions or concerns that you may have at that time regarding the information given to you following your procedure. This is a courtesy call and so if there is no answer at the home number and we have not heard from you through the emergency physician on call, we will assume that you have returned to your regular daily activities without incident.  SIGNATURES/CONFIDENTIALITY: You and/or your care partner have signed paperwork which will be entered into your electronic medical record.  These signatures attest to the fact that that the information above on your After Visit Summary has been reviewed and is understood.  Full responsibility of the confidentiality of this   discharge information lies with you and/or your care-partner.  The nurses from the 3rd floor will call you with the CT scan date and time.   They will also set you up to see a surgeon.  You recovery room nurse gave you two bottles of contrast.  The nurse from the 3rd floor will also tell you when to drink them.  Call us if you have questions.

## 2014-03-11 NOTE — Progress Notes (Signed)
Called to room to assist during endoscopic procedure.  Patient ID and intended procedure confirmed with present staff. Received instructions for my participation in the procedure from the performing physician.  

## 2014-03-14 ENCOUNTER — Telehealth: Payer: Self-pay | Admitting: *Deleted

## 2014-03-14 NOTE — Telephone Encounter (Signed)
  Follow up Call-  Call back number 03/11/2014  Post procedure Call Back phone  # [phone]  Permission to leave phone message Yes     Patient questions:  Do you have a fever, pain , or abdominal swelling? No. Pain Score  0 *  Have you tolerated food without any problems? Yes.    Have you been able to return to your normal activities? Yes.    Do you have any questions about your discharge instructions: Diet   No. Medications  No. Follow up visit  Yes.    Do you have questions or concerns about your Care? No.  Actions: * If pain score is 4 or above: No action needed, pain <4. Pt. Had questions about CT scan and about surgeon, explained to pt. That the office will schedule a CT scan  and surgical consult and notify her of the appointment,she also wanted to know if they can do it fast as possible,made her aware that she needed to discuss with that with surgeon. Pt. Stated that our office was very good.

## 2014-03-15 ENCOUNTER — Other Ambulatory Visit: Payer: Self-pay

## 2014-03-15 DIAGNOSIS — C20 Malignant neoplasm of rectum: Secondary | ICD-10-CM

## 2014-03-15 NOTE — Progress Notes (Signed)
Per procedure report 03/11/14 patient needs surgical referral.  She is scheduled at [facility] with Dr. Marcello Moores for 03/29/14 10:30 CT chest abd/pelvis is scheduled for 03/18/14 3:00 Left message for patient to call back      Patient notified of the CT scan and aptt @ [facility]

## 2014-03-17 ENCOUNTER — Other Ambulatory Visit (INDEPENDENT_AMBULATORY_CARE_PROVIDER_SITE_OTHER): Payer: Commercial Managed Care - HMO

## 2014-03-17 DIAGNOSIS — C20 Malignant neoplasm of rectum: Secondary | ICD-10-CM | POA: Diagnosis not present

## 2014-03-17 LAB — CREATININE, SERUM: Creatinine, Ser: 0.49 mg/dL (ref 0.40–1.20)

## 2014-03-17 LAB — BUN: BUN: 14 mg/dL (ref 6–23)

## 2014-03-18 ENCOUNTER — Ambulatory Visit (INDEPENDENT_AMBULATORY_CARE_PROVIDER_SITE_OTHER)
Admission: RE | Admit: 2014-03-18 | Discharge: 2014-03-18 | Disposition: A | Discharge status: Home / Self Care | Payer: Commercial Managed Care - HMO | Source: Ambulatory Visit | Attending: Gastroenterology | Admitting: Gastroenterology

## 2014-03-18 DIAGNOSIS — C20 Malignant neoplasm of rectum: Secondary | ICD-10-CM | POA: Diagnosis not present

## 2014-03-18 DIAGNOSIS — Z951 Presence of aortocoronary bypass graft: Secondary | ICD-10-CM | POA: Diagnosis not present

## 2014-03-18 MED ORDER — IOHEXOL 300 MG/ML  SOLN
100.0000 mL | Freq: Once | INTRAMUSCULAR | Status: AC | PRN
  Administered 2014-03-18: 100 mL via INTRAVENOUS

## 2014-03-22 ENCOUNTER — Telehealth: Payer: Self-pay

## 2014-03-22 ENCOUNTER — Other Ambulatory Visit: Payer: Self-pay

## 2014-03-22 DIAGNOSIS — C20 Malignant neoplasm of rectum: Secondary | ICD-10-CM

## 2014-03-22 NOTE — Telephone Encounter (Signed)
EUS scheduled, pt instructed and medications reviewed.  Patient instructions mailed to home.  Patient to call with any questions or concerns.  

## 2014-03-22 NOTE — Telephone Encounter (Signed)
-----   Message from Milus Banister, MD sent at 03/21/2014  9:13 AM EST ----- Norberto Sorenson, I just reviewed your colonoscopy report, pathology, CT scan. I think endoscopic ultrasound of the rectal cancer is the appropriate next step. Please let her know that Chong Sicilian will be getting in touch with her in the next couple days to schedule lower endoscopic ultrasound. I can have this put on for this Thursday if she can do it then. She is going to need referral to general surgery definitely. May also need referral to medical and radiation oncology pending staging of the rectal cancer.  Thanks  Chong Sicilian can you call her in the next 1-2 days about lower endoscopic ultrasound. I would like to do that for her this Thursday, the 18th. Moderate sedation will be fine.   ----- Message -----    From: Ladene Artist, MD    Sent: 03/20/2014  10:18 AM      To: Milus Banister, MD  Dan,  This pt has a newly diagnosed rectal adenoocarcinoma. CT negative for signs of local or distant disease. Also has a large asc colon polyp not amenable to endoscopic removal. Should we proceed with a rectal EUS?  Norberto Sorenson

## 2014-03-24 ENCOUNTER — Ambulatory Visit (HOSPITAL_COMMUNITY)
Admission: RE | Admit: 2014-03-24 | Discharge: 2014-03-24 | Disposition: A | Discharge status: Home / Self Care | Payer: Commercial Managed Care - HMO | Source: Ambulatory Visit | Attending: Gastroenterology | Admitting: Gastroenterology

## 2014-03-24 ENCOUNTER — Encounter (HOSPITAL_COMMUNITY)
Admission: RE | Disposition: A | Discharge status: Home / Self Care | Payer: Self-pay | Source: Ambulatory Visit | Attending: Gastroenterology

## 2014-03-24 ENCOUNTER — Encounter (HOSPITAL_COMMUNITY): Payer: Self-pay | Admitting: *Deleted

## 2014-03-24 DIAGNOSIS — I2510 Atherosclerotic heart disease of native coronary artery without angina pectoris: Secondary | ICD-10-CM | POA: Diagnosis not present

## 2014-03-24 DIAGNOSIS — C20 Malignant neoplasm of rectum: Secondary | ICD-10-CM | POA: Diagnosis not present

## 2014-03-24 DIAGNOSIS — F329 Major depressive disorder, single episode, unspecified: Secondary | ICD-10-CM | POA: Insufficient documentation

## 2014-03-24 DIAGNOSIS — E785 Hyperlipidemia, unspecified: Secondary | ICD-10-CM | POA: Diagnosis not present

## 2014-03-24 DIAGNOSIS — I252 Old myocardial infarction: Secondary | ICD-10-CM | POA: Insufficient documentation

## 2014-03-24 DIAGNOSIS — I119 Hypertensive heart disease without heart failure: Secondary | ICD-10-CM | POA: Diagnosis not present

## 2014-03-24 DIAGNOSIS — Z955 Presence of coronary angioplasty implant and graft: Secondary | ICD-10-CM | POA: Insufficient documentation

## 2014-03-24 DIAGNOSIS — Z794 Long term (current) use of insulin: Secondary | ICD-10-CM | POA: Insufficient documentation

## 2014-03-24 DIAGNOSIS — Z951 Presence of aortocoronary bypass graft: Secondary | ICD-10-CM | POA: Insufficient documentation

## 2014-03-24 DIAGNOSIS — E119 Type 2 diabetes mellitus without complications: Secondary | ICD-10-CM | POA: Diagnosis not present

## 2014-03-24 HISTORY — PX: EUS: SHX5427

## 2014-03-24 LAB — GLUCOSE, CAPILLARY: Glucose-Capillary: 186 mg/dL — ABNORMAL HIGH (ref 70–99)

## 2014-03-24 SURGERY — ULTRASOUND, LOWER GI TRACT, ENDOSCOPIC
Anesthesia: Moderate Sedation

## 2014-03-24 MED ORDER — MIDAZOLAM HCL 10 MG/2ML IJ SOLN
INTRAMUSCULAR | Status: AC
  Filled 2014-03-24: qty 2

## 2014-03-24 MED ORDER — MIDAZOLAM HCL 10 MG/2ML IJ SOLN
INTRAMUSCULAR | Status: DC | PRN
  Administered 2014-03-24: 2 mg via INTRAVENOUS

## 2014-03-24 MED ORDER — SODIUM CHLORIDE 0.9 % IV SOLN
INTRAVENOUS | Status: DC
  Administered 2014-03-24: 500 mL via INTRAVENOUS

## 2014-03-24 MED ORDER — SPOT INK MARKER SYRINGE KIT
PACK | SUBMUCOSAL | Status: AC
  Filled 2014-03-24: qty 5

## 2014-03-24 MED ORDER — SPOT INK MARKER SYRINGE KIT
PACK | SUBMUCOSAL | Status: DC | PRN
  Administered 2014-03-24: 3.6 mL via SUBMUCOSAL

## 2014-03-24 MED ORDER — DIPHENHYDRAMINE HCL 50 MG/ML IJ SOLN
INTRAMUSCULAR | Status: AC
  Filled 2014-03-24: qty 1

## 2014-03-24 MED ORDER — FENTANYL CITRATE 0.05 MG/ML IJ SOLN
INTRAMUSCULAR | Status: DC | PRN
  Administered 2014-03-24: 25 ug via INTRAVENOUS

## 2014-03-24 MED ORDER — FENTANYL CITRATE 0.05 MG/ML IJ SOLN
INTRAMUSCULAR | Status: AC
  Filled 2014-03-24: qty 2

## 2014-03-24 NOTE — Interval H&P Note (Signed)
History and Physical Interval Note:  03/24/2014 2:10 PM  [name]  has presented today for surgery, with the diagnosis of rectal cancer  The various methods of treatment have been discussed with the patient and family. After consideration of risks, benefits and other options for treatment, the patient has consented to  Procedure(s): LOWER ENDOSCOPIC ULTRASOUND (EUS) (N/A) as a surgical intervention .  The patient's history has been reviewed, patient examined, no change in status, stable for surgery.  I have reviewed the patient's chart and labs.  Questions were answered to the patient's satisfaction.     Milus Banister

## 2014-03-24 NOTE — Discharge Instructions (Signed)
Flexible Sigmoidoscopy, Care After °Refer to this sheet in the next few weeks. These instructions provide you with information on caring for yourself after your procedure. Your health care provider may also give you more specific instructions. Your treatment has been planned according to current medical practices, but problems sometimes occur. Call your health care provider if you have any problems or questions after your procedure. °WHAT TO EXPECT AFTER THE PROCEDURE °After your procedure, it is typical to have the following:  °· Abdominal cramps. °· Bloating. °· A small amount of rectal bleeding if you had a biopsy. °HOME CARE INSTRUCTIONS °· Only take over-the-counter or prescription medicines for pain, fever, or discomfort as directed by your health care provider. °· Resume your normal diet and activities as directed by your health care provider. °SEEK MEDICAL CARE IF: °· You have abdominal pain or cramping that lasts longer than 1 hour after the procedure. °· You continue to have small amounts of rectal bleeding after 24 hours. °· You have nausea or vomiting. °· You feel weak or dizzy. °SEEK IMMEDIATE MEDICAL CARE IF:  °· You have a fever. °· You pass large blood clots or see a large amount of blood in the toilet after having a bowel movement. This may also occur 10-14 days after the procedure. It is more likely if you had a biopsy. °· You develop abdominal pain that is not relieved with medicine or your abdominal pain gets worse. °· You have nausea or vomiting for more than 24 hours after the procedure. °Document Released: 01/26/2013 Document Reviewed: 01/26/2013 °Exit[employer]® Patient Information ©2015 [employer]. This information is not intended to replace advice given to you by your health care provider. Make sure you discuss any questions you have with your health care provider. ° °

## 2014-03-24 NOTE — Op Note (Signed)
[facility] [place], [address]   ENDOSCOPIC ULTRASOUND PROCEDURE REPORT  PATIENT: [name], [name]  MR#: [id number] BIRTHDATE: Dec 19, 1950  GENDER: female ENDOSCOPIST: Milus Banister, MD REFERRED BY:  Ladene Artist, M.D, [facility] PROCEDURE DATE:  03/24/2014 PROCEDURE:   Lower EUS, flex sig with injection ASA CLASS:      Class II INDICATIONS:   Recently found to have rectal adenocarcinoma (as well as large ascending colon polyp that could not be removed endoscopically); CT scan shows no sign of metastatic disease. MEDICATIONS: Fentanyl 25 mcg IV and Versed 2 mg IV  DESCRIPTION OF PROCEDURE:   After the risks benefits and alternatives of the procedure were  explained, informed consent was obtained. The patient was then placed in the left, lateral, decubitus postion and IV sedation was administered. Throughout the procedure, the patients blood pressure, pulse and oxygen saturations were monitored continuously.  Under direct visualization, the Pentax Radial EUS [id number]  endoscope was introduced through the anus and advanced to the sigmoid colon . Water was used as necessary to provide an acoustic interface.  Upon completion of the imaging, water was removed and the patient was sent to the recovery room in satisfactory condition.  Sigmoidoscoipc findings: 1. Mass in proximal rectum, measuring 4cm in length. This is clearly malignant, fairly flat, occupies 1/2 the circumference of the rectal lumen (non-circumferential), distal edge is 8cm from the anal verge.  Following EUS evaluation I injected SPOT in two locations (lateral edges of the tumor) to aid in future localization.  EUS findings: 1. The mass above corresponds with a hypoechoic lesion that measures 1.5cm in depth, clearly passes into and through the muscularis propria layer of the rectal wall (uT3). 2. No perirectal adenopathy (uN0).  ENDOSCOPIC IMPRESSION: 4cm long, non-circumferential,  uT3N0 (Stage IIa) rectal adenocarcinoma that lays along the right anterior wall of the rectum wtih distal edge 8cm from the anal verge.  RECOMMENDATIONS: She will likely benefit from neoadjuvant chemo/XRT prior to surgical resection.  _______________________________ eSignedMilus Banister, MD 03/24/2014 3:03 PM

## 2014-03-24 NOTE — H&P (View-Only) (Signed)
Subjective:    Patient ID: [name], female    DOB: 03-12-1950, 64 y.o.   MRN: [id number]  HPI   Follow-up type 2 diabetes. Poor compliance with diet- especially snacking at night. She is currently on Levemir 96 units at night and NovoLog 10 units with each meal. Fastings vary considerably and seem to correlate very well with her dietary compliance. No recent hypoglycemia. No chest pains. Other medications reviewed and stable. Her blood pressures have been stable. No headaches. No dizziness. She remains on atorvastatin for hyperlipidemia. Recent lipids were reviewed with patient and at goal. Her triglycerides are now normal  Past Medical History  Diagnosis Date  . Diabetes mellitus, insulin dependent (IDDM), uncontrolled   . Hyperlipidemia   . Hypertensive heart disease   .  Paroxysmal SVT (ANVRT)     S/p AV nodal ablation Dr. Lovena Le 09/28/08   . Depression   . CAD     Coronary disease status post non-ST elevation MI in May 2010 with  PCI of the left circumflex on Jun 24, 2008. She then had a PCI of  the RCA on July 15, 2008. 02/24/12 Cath, Severe 95% LAD, ostial 95% circ, ostial RCA, patent stents in distal RCA and mid circ normal LV 02/26/12 CABG with LIMA to LAD, SVG to RCA, and SVG to OM Dr. Roxan Hockey    . Cataract    Past Surgical History  Procedure Laterality Date  . Coronary angioplasty with stent placement    . Coronary artery bypass graft  02/26/2012    Roxan Hockey, MD;  Location: [place];  Service: Open Heart Surgery;  Laterality: N/A;  CABG x three,  using left internal mammary artery  and left leg greater saphenous vein,   . Endovein harvest of greater saphenous vein  02/26/2012  . Left heart catheterization with coronary angiogram N/A 02/24/2012    Procedure: LEFT HEART CATHETERIZATION WITH CORONARY ANGIOGRAM;  Surgeon: Jacolyn Reedy, MD;  Location: [facility] CATH LAB;  Service: Cardiovascular;  Laterality: N/A;    reports that she has never smoked. She has never used smokeless  tobacco. She reports that she does not drink alcohol or use illicit drugs. family history is negative for Colon cancer. No Known Allergies    Review of Systems  Constitutional: Negative for fatigue.  Eyes: Negative for visual disturbance.  Respiratory: Negative for cough, chest tightness, shortness of breath and wheezing.   Cardiovascular: Negative for chest pain, palpitations and leg swelling.  Neurological: Negative for dizziness, seizures, syncope, weakness, light-headedness and headaches.       Objective:   Physical Exam  Constitutional: She is oriented to person, place, and time. She appears well-developed and well-nourished.  HENT:  Mouth/Throat: Oropharynx is clear and moist.  Neck: Neck supple. No JVD present.  Cardiovascular: Normal rate and regular rhythm.   Pulmonary/Chest: Effort normal and breath sounds normal. No respiratory distress. She has no wheezes. She has no rales.  Musculoskeletal: She exhibits no edema.  Neurological: She is alert and oriented to person, place, and time.          Assessment & Plan:  #1 type 2 diabetes. History of poor compliance. Recent A1c 7.5%. Tighten up dietary compliance and we'll plan repeat A1c in 3 months. We elected not to make any changes in medication this time. We discussed briefly potential for other medications such as Invokana but at this point prefer improved compliance first #2 dyslipidemia. Recent labs reviewed. Continue atorvastatin  # 3 history of depression. Currently  stable on sertraline.

## 2014-03-25 ENCOUNTER — Telehealth: Payer: Self-pay

## 2014-03-25 ENCOUNTER — Encounter (HOSPITAL_COMMUNITY): Payer: Self-pay | Admitting: Gastroenterology

## 2014-03-25 DIAGNOSIS — C20 Malignant neoplasm of rectum: Secondary | ICD-10-CM

## 2014-03-25 NOTE — Telephone Encounter (Signed)
Patient is scheduled with radiation oncology for 03/30/14

## 2014-03-25 NOTE — Telephone Encounter (Signed)
-----   Message from Danielle Artist, MD sent at 03/25/2014  8:49 AM EST ----- Thanks Linna Hoff. Barbera Setters will you please make the referrals.   ----- Message -----    From: Milus Banister, MD    Sent: 03/24/2014   3:04 PM      To: Danielle Artist, MD  Norberto Sorenson, Just completed EUS. See below.  She will need referral to medical and radiation oncology.  I'll let her know.  Thanks  DJ   Sigmoidoscoipc findings: 1. Mass in proximal rectum, measuring 4cm in length. This is clearly malignant, fairly flat, occupies 1/2 the circumference of the rectal lumen (non-circumferential), distal edge is 8cm from the anal verge.  Following EUS evaluation I injected SPOT in two locations (lateral edges of the tumor) to aid in future localization.  EUS findings: 1. The mass above corresponds with a hypoechoic lesion that measures 1.5cm in depth, clearly passes into and through the muscularis propria layer of the rectal wall (uT3). 2. No perirectal adenopathy (uN0).  ENDOSCOPIC IMPRESSION: 4cm long, non-circumferential, uT3N0 (Stage IIa)rectal adenocarcinoma that lays along the right anterior wall of the rectum wtih distal edge 8cm from the anal verge.     RECOMMENDATIONS: She will likely benefit from neoadjuvant chemo/XRT prior to surgical resection.

## 2014-03-25 NOTE — Telephone Encounter (Signed)
Patient notified .  She is aware that she will be contacted for two appointments with Oncology.  All questions answered Referrals placed

## 2014-03-28 ENCOUNTER — Encounter: Payer: Self-pay | Admitting: *Deleted

## 2014-03-28 NOTE — [facility] Oncology Navigator Note (Signed)
Received referral from [place] for medical oncology-rectal cancer. Sees radiation oncology on 2/24.

## 2014-03-29 ENCOUNTER — Encounter: Payer: Self-pay | Admitting: Radiation Oncology

## 2014-03-29 DIAGNOSIS — C20 Malignant neoplasm of rectum: Secondary | ICD-10-CM | POA: Diagnosis not present

## 2014-03-29 NOTE — Progress Notes (Signed)
GI Location of Tumor / Histology: adenocarcinoma of rectum  [name] requested a colonoscopy after seeing bright red blood in her stool.  Biopsies of Specimen(s) Obtained: 1. Surgical [P], ascending, biopsy 2. Surgical [P], sigmoid, polyp 3. Surgical [P], rectum, biopsy Revealed:   Past/Anticipated interventions by surgeon, if any: surgery following radiation  Past/Anticipated interventions by medical oncology, if any: none  Weight changes, if any: none  Bowel/Bladder complaints, if any: diarrhea x 6 with bright red blood; no dysuria or hematuria; denies vaginal discharge or odor  Nausea / Vomiting, if any: occasional nausea, denies vomiting  Pain issues, if any:    SAFETY ISSUES:  Prior radiation? no  Pacemaker/ICD? no  Possible current pregnancy? no  Is the patient on methotrexate? no  Current Complaints / other details:  64 year old female. 4 cm mass was found in the proximal rectum occupying 1/2 circum of the rectal lumen. Recommendation is for xrt prior to surgical resection.

## 2014-03-30 ENCOUNTER — Ambulatory Visit
Admission: RE | Admit: 2014-03-30 | Discharge: 2014-03-30 | Disposition: A | Discharge status: Home / Self Care | Payer: Commercial Managed Care - HMO | Source: Ambulatory Visit | Attending: Radiation Oncology | Admitting: Radiation Oncology

## 2014-03-30 ENCOUNTER — Encounter: Payer: Self-pay | Admitting: Radiation Oncology

## 2014-03-30 ENCOUNTER — Other Ambulatory Visit: Payer: Self-pay | Admitting: *Deleted

## 2014-03-30 ENCOUNTER — Telehealth: Payer: Self-pay | Admitting: Hematology

## 2014-03-30 VITALS — BP 129/64 | HR 91 | Temp 98.0°F | Resp 16 | Ht 60.0 in | Wt 159.8 lb

## 2014-03-30 DIAGNOSIS — C20 Malignant neoplasm of rectum: Secondary | ICD-10-CM | POA: Insufficient documentation

## 2014-03-30 DIAGNOSIS — C19 Malignant neoplasm of rectosigmoid junction: Secondary | ICD-10-CM

## 2014-03-30 NOTE — Progress Notes (Signed)
Patient reports she occasionally feels off balance related to effects of inner ear issues. Reports occasionally she feels nausea but, denies emesis. Reports diarrhea on average six times per day. Reports bright red blood in stool. Reports initially she saw just streaks of blood in her stool but, now she see approximately a tsp or so. Reports her paternal grandmother had polyp but, she is uncertain they were cancerous. Denies abdominal pain. Reports she is eating well and has an excellent appetite. Denies weight loss. Denies pain. Vitals WDL.

## 2014-03-30 NOTE — Progress Notes (Signed)
See progress note under physician encounter. 

## 2014-03-30 NOTE — Telephone Encounter (Signed)
Called pt could not leave vm.  Called pt sister Maryann left vm in ref to Marry's appt. Ask to return call to confirm appt.

## 2014-03-30 NOTE — Progress Notes (Signed)
Consulted with Dr. Burr Medico on referral for rectal cancer. She will see her on 2/26 at 12:00. Request to scheduler and HIM

## 2014-03-31 DIAGNOSIS — C20 Malignant neoplasm of rectum: Secondary | ICD-10-CM | POA: Insufficient documentation

## 2014-03-31 NOTE — Progress Notes (Signed)
[place]  [phone] [phone]  Clinic New Consult Note   Patient Care Team: Eulas Post, MD as PCP - General Jacolyn Reedy, MD as Consulting Physician (Cardiology) 04/01/2014  CHIEF COMPLAINTS/PURPOSE OF CONSULTATION:  Newly diagnosed rectal cancer    Oncology History   Rectal cancer   Staging form: Colon and Rectum, AJCC 7th Edition     Clinical: Stage IIA (T3, N0, M0) - Signed by Truitt Merle, MD on 03/31/2014       Rectal cancer   03/11/2014 Initial Diagnosis Rectal cancer, cT3N0   03/11/2014 Pathology Results Rectal mass biopsy showed invasive adenocarcinoma. Ascending colon polyp biopsy showed tubulovillous adenoma.   03/11/2014 Procedure Colonoscopy showed 3 sessiile polyp (2 in ascending, 1 in descending colon), not removed,  and a rectal mass measuring 4 x 3 cm. EUS showed T3N0 disease    03/18/2014 Imaging CT CAP showed lower rectal wall thickening, no metastatic disease or adenopathy.     HISTORY OF PRESENTING ILLNESS:  [name] 64 y.o. female is here because of newly diagnosed rectal cancer.  She has had rectal bleeding for 3 month, blood on surface of stool, 1/2-1 tea spoon each time, intermittent, no rectal pain or other complains. She feels well overall. She denies nausea, abdominal discomfort, change of her appetite or weight loss. She was evaluated by her PCP and was referred to Dr. Fuller Plan. Her appointment was rescheduled three times due to multiple reasons. She finally had colonoscopy on 03/07/2014, which showed a half circumferential firm mass at the rectum, measuring 4 x 3 cm,, it extended from 7-11 cm in the mid rectum. Additional 2 sessile polyps in the ascending colon and 1 sessile polyp in the sigmoid colon were found and biopsied. The rectal mass biopsy showed adenocarcinoma. EUS was done by Dr. Ardis Hughs showed a T3 lesion, no regional adenopathy. She was seen by colorectal surgeon Dr. Marcello Moores, and was referred to  radiation oncologist Dr. Lisbeth Renshaw and the me to discuss neoadjuvant chemotherapy radiation.  She feels well overall, has good appetite and eats well, she has mild fatigue since her CABG 2 years ago.  No chest pain or dyspnea. No significant weight loss recently.   MEDICAL HISTORY:  Past Medical History  Diagnosis Date  . Diabetes mellitus, insulin dependent (IDDM), uncontrolled   . Hyperlipidemia   . Hypertensive heart disease   .  Paroxysmal SVT (ANVRT)     S/p AV nodal ablation Dr. Lovena Le 09/28/08   . Depression   . CAD     Coronary disease status post non-ST elevation MI in May 2010 with  PCI of the left circumflex on Jun 24, 2008. She then had a PCI of  the RCA on July 15, 2008. 02/24/12 Cath, Severe 95% LAD, ostial 95% circ, ostial RCA, patent stents in distal RCA and mid circ normal LV 02/26/12 CABG with LIMA to LAD, SVG to RCA, and SVG to OM Dr. Roxan Hockey    . Cataract   . Cancer     adenocarcinoma of rectum    SURGICAL HISTORY: Past Surgical History  Procedure Laterality Date  . Coronary angioplasty with stent placement    . Coronary artery bypass graft  02/26/2012    Roxan Hockey, MD;  Location: [place] [place];  Service: Open Heart Surgery;  Laterality: N/A;  CABG x three,  using left internal mammary artery  and left leg greater saphenous vein,   . Endovein harvest of greater saphenous vein  02/26/2012  . Left heart  catheterization with coronary angiogram N/A 02/24/2012    Procedure: LEFT HEART CATHETERIZATION WITH CORONARY ANGIOGRAM;  Surgeon: Jacolyn Reedy, MD;  Location: [facility] CATH LAB;  Service: Cardiovascular;  Laterality: N/A;  . Eus N/A 03/24/2014    Procedure: LOWER ENDOSCOPIC ULTRASOUND (EUS);  Surgeon: Milus Banister, MD;  Location: [facility];  Service: Endoscopy;  Laterality: N/A;  . Colonoscopy      SOCIAL HISTORY: History   Social History  . Marital Status: Divorced    Spouse Name: N/A  . Number of Children: One son at age 38  . Years of Education: N/A    Occupational History  . Not on file.   Social History Main Topics  . Smoking status: Never Smoker   . Smokeless tobacco: Never Used  . Alcohol Use: No  . Drug Use: No  . Sexual Activity: No   Other Topics Concern  .  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 Colon cancer Neg Hx     ALLERGIES:  has No Known Allergies.  MEDICATIONS:  Current Outpatient Prescriptions  Medication Sig Dispense Refill  . aspirin 81 MG tablet Take 81 mg by mouth at bedtime.     Marland Kitchen atorvastatin (LIPITOR) 20 MG tablet TAKE ONE TABLET BY MOUTH ONCE DAILY AT BEDTIME 90 tablet 3  . Coral Calcium 1000 (390 CA) MG TABS Take 1,000 mg by mouth 2 (two) times daily.    . fish oil-omega-3 fatty acids 1000 MG capsule Take 2 g by mouth 2 (two) times daily.     . furosemide (LASIX) 40 MG tablet Take 40 mg by mouth daily as needed for fluid or edema.    . insulin aspart (NOVOLOG) 100 UNIT/ML injection Inject 10 units before each meal.    . insulin detemir (LEVEMIR) 100 UNIT/ML injection Inject 96 Units into the skin at bedtime.     . metFORMIN (GLUCOPHAGE) 1000 MG tablet Take 1 tablet (1,000 mg total) by mouth 2 (two) times daily with a meal. 60 tablet 5  . metoprolol tartrate (LOPRESSOR) 25 MG tablet Take 25 mg by mouth 2 (two) times daily.    . sertraline (ZOLOFT) 50 MG tablet Take 1 tablet (50 mg total) by mouth at bedtime. 30 tablet 3   No current facility-administered medications for this visit.    REVIEW OF SYSTEMS:   Constitutional: Denies fevers, chills or abnormal night sweats Eyes: Denies blurriness of vision, double vision or watery eyes Ears, nose, mouth, throat, and face: Denies mucositis or sore throat Respiratory: Denies cough, dyspnea or wheezes Cardiovascular: Denies palpitation, chest discomfort or lower extremity swelling Gastrointestinal:  Denies nausea, heartburn or change in bowel habits Skin: Denies abnormal skin  rashes Lymphatics: Denies new lymphadenopathy or easy bruising Neurological:Denies numbness, tingling or new weaknesses Behavioral/Psych: Mood is stable, no new changes  All other systems were reviewed with the patient and are negative.  PHYSICAL EXAMINATION: ECOG PERFORMANCE STATUS: 0 - Asymptomatic  Filed Vitals:   04/01/14 1232  BP: 146/56  Pulse: 79  Temp: 98.5 F (36.9 C)  Resp: 18   Filed Weights   04/01/14 1232  Weight: 158 lb 6.4 oz (71.85 kg)    GENERAL:alert, no distress and comfortable SKIN: skin color, texture, turgor are normal, no rashes or significant lesions EYES: normal, conjunctiva are pink and non-injected, sclera clear OROPHARYNX:no exudate, no erythema and lips, buccal mucosa, and tongue normal  NECK: supple, thyroid  normal size, non-tender, without nodularity LYMPH:  no palpable lymphadenopathy in the cervical, axillary or inguinal LUNGS: clear to auscultation and percussion with normal breathing effort HEART: regular rate & rhythm and no murmurs and no lower extremity edema ABDOMEN:abdomen soft, non-tender and normal bowel sounds Musculoskeletal:no cyanosis of digits and no clubbing  PSYCH: alert & oriented x 3 with fluent speech NEURO: no focal motor/sensory deficits  LABORATORY DATA:  I have reviewed the data as listed Lab Results  Component Value Date   WBC 5.7 02/27/2013   HGB 13.0 02/27/2013   HCT 36.1 02/27/2013   MCV 89.1 02/27/2013   PLT 120* 02/27/2013    Recent Labs  03/17/14 1044  BUN 14  CREATININE 0.49   PATHOLOGY REPORT: FINAL DIAGNOSIS Diagnosis 03/11/2014 1. Surgical [P], ascending, biopsy MULTIPLE FRAGMENTS OF TUBULOVILLOUS ADENOMA. PLEASE SEE COMMENT. 2. Surgical [P], sigmoid, polyp HYPERPLASTIC POLYP(S). NO ADENOMATOUS CHANGE OR MALIGNANCY IDENTIFIED. 3. Surgical [P], rectum, biopsy INVASIVE ADENOCARCINOMA. PLEASE SEE COMMENT. Microscopic Comment 1. There is adenomatous epithelium having both tubular and villous  growth patterns consistent with a tubulovillous adenoma if the biopsy is representative of the entire lesion. No high grade dysplasia or evidence of malignancy is identified. Clinical correlation is recommended. It is not clear whether this represents the entire lesion noted clinically or whether this is a portion of a larger lesion in which case the changes here may or may not be representative. 3. There are malignant glandular cells arranged in acinar and cribriform patterns with associated desmoplastic stromal reaction. The findings are diagnostic for invasive adenocarcinoma. Case was discussed with Dr. Fuller Plan on 03-15-2014.  RADIOGRAPHIC STUDIES: I have personally reviewed the radiological images as listed and agreed with the findings in the report.  Ct Abdomen Pelvis W Contrast 03/18/2014    IMPRESSION: Lower rectal wall thickening, likely corresponding to known rectal adenocarcinoma.  No findings suspicious for metastatic disease in the chest, abdomen, or pelvis.   Electronically Signed   By: Julian Hy M.D.   On: 03/18/2014 16:06   Colonoscopy 03/11/2014: COLON FINDINGS: A sessile polyp measuring 18 mm in size was found in the ascending colon. It was not removed due to the adjacent large polyp whcih will require surgical resection. A sessile polyp measuring 40 mm in size was found in the ascending colon. Multiple biopsies were performed. Injection (tattooing) was performed. It was not amenable to endoscopic removal. A sessile polyp measuring 6 mm in size was found in the sigmoid colon. A polypectomy was performed with a cold snare. The resection was complete, the polyp tissue was completely retrieved and sent to histology. A half circumferential firm mass, measuring 4 X 3cm in size, was found in the rectum. It extended from 7-11 cm in the mid rectum. Multiple biopsies of the lesion were performed. The examination was otherwise normal. Retroflexed views revealed no abnormalities. The  time to cecum=4 minutes 28 seconds. Withdrawal time=19 minutes 30 seconds. The scope was withdrawn and the procedure completed. COMPLICATIONS: There were no immediate complications. ENDOSCOPIC IMPRESSION: 1. Sessile polyp in the ascending colon-not removed 2. Sessile polyp in the ascending colon; not removed; multiple biopsies performed; Injection (tattooing) performed 3. Sessile polyp in the sigmoid colon; polypectomy performed with a cold snare 4. Rectal mass, measuring 4 X 3 cm; multiple biopsies of the lesion performed  EUS findings: 1. The mass above corresponds with a hypoechoic lesion that measures 1.5cm in depth, clearly passes into and through the muscularis propria layer of the rectal wall (uT3). 2.  No perirectal adenopathy (uN0). ENDOSCOPIC IMPRESSION: 4cm long, non-circumferential, uT3N0 (Stage IIa) rectal adenocarcinoma that lays along the right anterior wall of the rectum wtih distal edge 8cm from the anal verge.  ASSESSMENT & PLAN:  64 year old Caucasian female, with past medical history of hypertension, diabetes, multivessel coronary artery disease, status post 2 stents placement and triple bypass 2 years ago. She presented with rectal bleeding.  1. Rectal adenocarcinoma, cT3N0M0, stage IIA  -I reviewed her imaging, endoscopical, and biopsy findings with her in great details. She has a clinica stage II a mid rectal adenocarcinoma, and the goal of care is curative.  -for T3/T4 rectal cancer, the standard care is neoadjuvant concurrent chemoradiation followed by surgery, and possible adjuvant chemotherapy. -I discussed the option of chemotherapy either withcontinuous 5-FU infusion or capecitabine 8 50 mg/m, every 12 hours, with concurrent radiation. The benefits and side effects of treatment were discussed with her in great details. -Chemotherapy consent: Side effects including but not limited to, fatigue, nausea, cytopenia, infection, need for blood transfusion, bleeding,  alopecia, mucositis, diarrhea abdominal pain, neuropathy, I specifically discussed the risk of coronary artery spasm from 5-FU or capecitabine, which can cause myocardial infarction and heart failure, giving her her history of coronary artery disease. I would like to contact her cardiologist Dr. Wynonia Lawman and have her a stress test before she starts chemotherapy. She voiced good understanding, agrees to proceed. Chemotherapy consents obtained today. -She opted oral capecitabine. Prescription was sent to [facility] today. Burnis Medin tentatively start her treatment on 04/11/2014.  2. Hypertension, diabetes, coronary artery disease -She will continue follow-up with her primary care physician and cardiologist -We discussed the risk of coronary artery spasm with chemotherapy.  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 Her son was recently hospitalized and a is going to live with her after discharge and would need some care also.  -We discussed she may need social support and help during her concurrent chemoradiation -We will refer to our social worker   Plan -Follow-up with Dr. Wynonia Lawman for a stress test and cardiac clearance -Tentatively start radiation and chemotherapy (capecitabine) on March 7 -Chemotherapy class next week -I'll see her back on March 7.  All questions were answered. The patient knows to call the clinic with any problems, questions or concerns. I spent 50 minutes counseling the patient face to face. The total time spent in the appointment was 60 minutes and more than 50% was on counseling.     Truitt Merle, MD 04/01/2014 1:02 PM

## 2014-04-01 ENCOUNTER — Encounter: Payer: Self-pay | Admitting: Radiation Oncology

## 2014-04-01 ENCOUNTER — Telehealth: Payer: Self-pay | Admitting: Hematology

## 2014-04-01 ENCOUNTER — Ambulatory Visit (HOSPITAL_BASED_OUTPATIENT_CLINIC_OR_DEPARTMENT_OTHER): Payer: Commercial Managed Care - HMO | Admitting: Hematology

## 2014-04-01 ENCOUNTER — Encounter: Payer: Self-pay | Admitting: Hematology

## 2014-04-01 VITALS — BP 146/56 | HR 79 | Temp 98.5°F | Resp 18 | Ht 60.0 in | Wt 158.4 lb

## 2014-04-01 DIAGNOSIS — E119 Type 2 diabetes mellitus without complications: Secondary | ICD-10-CM

## 2014-04-01 DIAGNOSIS — I10 Essential (primary) hypertension: Secondary | ICD-10-CM | POA: Diagnosis not present

## 2014-04-01 DIAGNOSIS — C20 Malignant neoplasm of rectum: Secondary | ICD-10-CM

## 2014-04-01 DIAGNOSIS — I2510 Atherosclerotic heart disease of native coronary artery without angina pectoris: Secondary | ICD-10-CM | POA: Diagnosis not present

## 2014-04-01 MED ORDER — CAPECITABINE 500 MG PO TABS: 825.0000 mg/m2 | ORAL_TABLET | Freq: Two times a day (BID) | ORAL | Status: DC

## 2014-04-01 NOTE — Progress Notes (Signed)
Faxed new prescription for Xeloda to [facility].   Received prior authorization request from pharmacy.  Gave form to [facility], care management.

## 2014-04-01 NOTE — Progress Notes (Signed)
Faxed xeloda pa form to [facility]

## 2014-04-01 NOTE — Progress Notes (Signed)
Radiation Oncology         (336) [phone] ________________________________  Name: [name] MRN: [id number]  Date: 03/30/2014  DOB: January 02, 1951  JE:HUDJSHFWY,OVZCH W, MD  Ladene Artist, MD     REFERRING PHYSICIAN: Ladene Artist, MD   DIAGNOSIS: The primary encounter diagnosis was Colorectal cancer. A diagnosis of Rectal cancer was also pertinent to this visit.  Rectal cancer   Staging form: Colon and Rectum, AJCC 7th Edition     Clinical: Stage IIA (T3, N0, M0) - Signed by Truitt Merle, MD on 03/31/2014    HISTORY OF PRESENT ILLNESS::[name] is a 64 y.o. female who is seen for an initial consultation visit regarding the patient's diagnosis of rectal cancer.  The patient presented with rectal bleeding for approximately 3 months.   A colonoscopy has been performed. A rectal mass was noted. . A biospy was performed. This returned positive for invasive adenocarcinoma.  On colonoscopy, 2 sessile polyps were seen within the ascending colon which were not removed and a sessile polyp was also seen in the sigmoid colon where a polypectomy was performed. A biopsy of one of the descending colon polyps revealed a tubulovillous adenoma. The sigmoid polyp revealed hyperplastic changes. No evidence of malignancy was seen in either polyp.  An endoscopic ultrasound has been performed. The tumor was not palpable on digital rectal exam. The tumor was described as clearly malignant flat tumor occupying one half of the circumference of the rectum. The tumor began at 8 cm from the anal verge and extended to 12 cm. Based on the this study, the tumor was staged as T 3 N and.   Further workup has included a CT scan of the abdomen/ pelvis. This did  show a corresponding rectal tumor which was noted as some concentric wall thickening within the rectum. Pelvic lymphadenopathy was not seen on this scan.  Further work has included a chest CT scan. No suspicious pulmonary nodules were seen.   PREVIOUS  RADIATION THERAPY: No   PAST MEDICAL HISTORY:  has a past medical history of Diabetes mellitus, insulin dependent (IDDM), uncontrolled; Hyperlipidemia; Hypertensive heart disease;  Paroxysmal SVT (ANVRT); Depression; CAD; Cataract; and Cancer.     PAST SURGICAL HISTORY: Past Surgical History  Procedure Laterality Date  . Coronary angioplasty with stent placement    . Coronary artery bypass graft  02/26/2012    Roxan Hockey, MD;  Location: [place];  Service: Open Heart Surgery;  Laterality: N/A;  CABG x three,  using left internal mammary artery  and left leg greater saphenous vein,   . Endovein harvest of greater saphenous vein  02/26/2012  . Left heart catheterization with coronary angiogram N/A 02/24/2012    Procedure: LEFT HEART CATHETERIZATION WITH CORONARY ANGIOGRAM;  Surgeon: Jacolyn Reedy, MD;  Location: [facility] CATH LAB;  Service: Cardiovascular;  Laterality: N/A;  . Eus N/A 03/24/2014    Procedure: LOWER ENDOSCOPIC ULTRASOUND (EUS);  Surgeon: Milus Banister, MD;  Location: [facility];  Service: Endoscopy;  Laterality: N/A;  . Colonoscopy       FAMILY HISTORY: family history is negative for Colon cancer.   SOCIAL HISTORY:  reports that she has never smoked. She has never used smokeless tobacco. She reports that she does not drink alcohol or use illicit drugs.   ALLERGIES: Review of patient's allergies indicates no known allergies.   MEDICATIONS:  Current Outpatient Prescriptions  Medication Sig Dispense Refill  . aspirin 81 MG tablet Take 81 mg by mouth at bedtime.     Marland Kitchen  atorvastatin (LIPITOR) 20 MG tablet TAKE ONE TABLET BY MOUTH ONCE DAILY AT BEDTIME 90 tablet 3  . Coral Calcium 1000 (390 CA) MG TABS Take 1,000 mg by mouth 2 (two) times daily.    . fish oil-omega-3 fatty acids 1000 MG capsule Take 2 g by mouth 2 (two) times daily.     . furosemide (LASIX) 40 MG tablet Take 40 mg by mouth daily as needed for fluid or edema.    . insulin aspart (NOVOLOG) 100 UNIT/ML injection  Inject 10 units before each meal.    . insulin detemir (LEVEMIR) 100 UNIT/ML injection Inject 96 Units into the skin at bedtime.     . metFORMIN (GLUCOPHAGE) 1000 MG tablet Take 1 tablet (1,000 mg total) by mouth 2 (two) times daily with a meal. 60 tablet 5  . metoprolol tartrate (LOPRESSOR) 25 MG tablet Take 25 mg by mouth 2 (two) times daily.    . sertraline (ZOLOFT) 50 MG tablet Take 1 tablet (50 mg total) by mouth at bedtime. 30 tablet 3   No current facility-administered medications for this encounter.     REVIEW OF SYSTEMS:  A 15 point review of systems is documented in the electronic medical record. This was obtained by the nursing staff. However, I reviewed this with the patient to discuss relevant findings and make appropriate changes.  Pertinent items are noted in HPI.    PHYSICAL EXAM:  height is 5' (1.524 m) and weight is 159 lb 12.8 oz (72.485 kg). Her oral temperature is 98 F (36.7 C). Her blood pressure is 129/64 and her pulse is 91. Her respiration is 16 and oxygen saturation is 100%.   ECOG = 1  0 - Asymptomatic (Fully active, able to carry on all predisease activities without restriction)  1 - Symptomatic but completely ambulatory (Restricted in physically strenuous activity but ambulatory and able to carry out work of a light or sedentary nature. For example, light housework, office work)  2 - Symptomatic, <50% in bed during the day (Ambulatory and capable of all self care but unable to carry out any work activities. Up and about more than 50% of waking hours)  3 - Symptomatic, >50% in bed, but not bedbound (Capable of only limited self-care, confined to bed or chair 50% or more of waking hours)  4 - Bedbound (Completely disabled. Cannot carry on any self-care. Totally confined to bed or chair)  5 - Death   Eustace Pen MM, Creech RH, Tormey DC, et al. [phone]). "Toxicity and response criteria of the [facility]". [place] Oncol. 5 (6):  [phone]  General: Well-developed, in no acute distress HEENT: Normocephalic, atraumatic; oral cavity clear Neck: Supple without any lymphadenopathy Cardiovascular: Regular rate and rhythm Respiratory: Clear to auscultation bilaterally GI: Soft, nontender, normal bowel sounds Extremities: No edema present Neuro: No focal deficits Rectal:  On digital rectal exam, no tumor was felt. No blood on exam glove.    LABORATORY DATA:  Lab Results  Component Value Date   WBC 5.7 02/27/2013   HGB 13.0 02/27/2013   HCT 36.1 02/27/2013   MCV 89.1 02/27/2013   PLT 120* 02/27/2013   Lab Results  Component Value Date   NA 140 02/27/2013   K 4.0 02/27/2013   CL 99 02/27/2013   CO2 23 02/27/2013   Lab Results  Component Value Date   ALT 25 11/19/2012   AST 26 11/19/2012   ALKPHOS 40 11/19/2012   BILITOT 0.4 11/19/2012  CEA: Not available    RADIOGRAPHY: Ct Chest W Contrast  03/18/2014   CLINICAL DATA:  Newly diagnosed rectal cancer  EXAM: CT CHEST, ABDOMEN, AND PELVIS WITH CONTRAST  TECHNIQUE: Multidetector CT imaging of the chest, abdomen and pelvis was performed following the standard protocol during bolus administration of intravenous contrast.  CONTRAST:  132mL OMNIPAQUE IOHEXOL 300 MG/ML  SOLN  COMPARISON:  CTA chest dated 08/12 2010  FINDINGS: CT CHEST FINDINGS  Mediastinum/Nodes: Heart is normal in size. No pericardial effusion.  Coronary atherosclerosis. Postsurgical changes related to prior CABG.  Atherosclerotic calcifications of the aortic arch.  No suspicious mediastinal, hilar, or axillary lymphadenopathy.  Visualized thyroid is unremarkable.  Lungs/Pleura: No suspicious pulmonary nodules. Calcified granulomata in the right middle lobe (series 3/ image 29) and posterior right upper lobe (series 3/image 8).  Mild emphysematous changes.  No pleural effusion or pneumothorax.  Musculoskeletal: Degenerative changes the thoracic spine. Median sternotomy.  CT ABDOMEN PELVIS FINDINGS   Motion degraded images.  Hepatobiliary: Liver is within normal limits.  Gallbladder is underdistended. No intrahepatic or extrahepatic ductal dilatation.  Pancreas: Mild fatty atrophy of the pancreatic body.  Spleen: Within normal limits.  Adrenals/Urinary Tract: Adrenal glands are unremarkable.  Mild cortical atrophy of the right kidney. Left kidney is within normal limits. No hydronephrosis.  Bladder is within normal limits.  Stomach/Bowel: Stomach is unremarkable.  No evidence of bowel obstruction.  Normal appendix.  Concentric wall thickening of the lower rectum (series 2/image 110).  Vascular/Lymphatic: Atherosclerotic calcifications of the abdominal aorta and branch vessels.  No suspicious abdominopelvic lymph nodes. No perirectal lymphadenopathy.  Reproductive: Uterus is unremarkable.  Bilateral ovaries are within normal limits.  Other: No abdominopelvic ascites.  Musculoskeletal: Degenerative changes of the lumbar spine with lumbar dextroscoliosis.  IMPRESSION: Lower rectal wall thickening, likely corresponding to known rectal adenocarcinoma.  No findings suspicious for metastatic disease in the chest, abdomen, or pelvis.   Electronically Signed   By: Julian Hy M.D.   On: 03/18/2014 16:06   Ct Abdomen Pelvis W Contrast  03/18/2014   CLINICAL DATA:  Newly diagnosed rectal cancer  EXAM: CT CHEST, ABDOMEN, AND PELVIS WITH CONTRAST  TECHNIQUE: Multidetector CT imaging of the chest, abdomen and pelvis was performed following the standard protocol during bolus administration of intravenous contrast.  CONTRAST:  153mL OMNIPAQUE IOHEXOL 300 MG/ML  SOLN  COMPARISON:  CTA chest dated 08/12 2010  FINDINGS: CT CHEST FINDINGS  Mediastinum/Nodes: Heart is normal in size. No pericardial effusion.  Coronary atherosclerosis. Postsurgical changes related to prior CABG.  Atherosclerotic calcifications of the aortic arch.  No suspicious mediastinal, hilar, or axillary lymphadenopathy.  Visualized thyroid is  unremarkable.  Lungs/Pleura: No suspicious pulmonary nodules. Calcified granulomata in the right middle lobe (series 3/ image 29) and posterior right upper lobe (series 3/image 8).  Mild emphysematous changes.  No pleural effusion or pneumothorax.  Musculoskeletal: Degenerative changes the thoracic spine. Median sternotomy.  CT ABDOMEN PELVIS FINDINGS  Motion degraded images.  Hepatobiliary: Liver is within normal limits.  Gallbladder is underdistended. No intrahepatic or extrahepatic ductal dilatation.  Pancreas: Mild fatty atrophy of the pancreatic body.  Spleen: Within normal limits.  Adrenals/Urinary Tract: Adrenal glands are unremarkable.  Mild cortical atrophy of the right kidney. Left kidney is within normal limits. No hydronephrosis.  Bladder is within normal limits.  Stomach/Bowel: Stomach is unremarkable.  No evidence of bowel obstruction.  Normal appendix.  Concentric wall thickening of the lower rectum (series 2/image 110).  Vascular/Lymphatic: Atherosclerotic calcifications of  the abdominal aorta and branch vessels.  No suspicious abdominopelvic lymph nodes. No perirectal lymphadenopathy.  Reproductive: Uterus is unremarkable.  Bilateral ovaries are within normal limits.  Other: No abdominopelvic ascites.  Musculoskeletal: Degenerative changes of the lumbar spine with lumbar dextroscoliosis.  IMPRESSION: Lower rectal wall thickening, likely corresponding to known rectal adenocarcinoma.  No findings suspicious for metastatic disease in the chest, abdomen, or pelvis.   Electronically Signed   By: Julian Hy M.D.   On: 03/18/2014 16:06       IMPRESSION:  Oncology History   Rectal cancer   Staging form: Colon and Rectum, AJCC 7th Edition     Clinical: Stage IIA (T3, N0, M0) - Signed by Truitt Merle, MD on 03/31/2014       Rectal cancer   03/11/2014 Initial Diagnosis Rectal cancer, cT3N0   03/11/2014 Pathology Results Rectal mass biopsy showed invasive adenocarcinoma. Ascending colon polyp  biopsy showed tubulovillous adenoma.   03/11/2014 Procedure Colonoscopy showed 3 sessiile polyp (2 in ascending, 1 in descending colon), not removed,  and a rectal mass measuring 4 x 3 cm. EUS showed T3N0 disease    03/18/2014 Imaging CT CAP showed lower rectal wall thickening, no metastatic disease or adenopathy.     The patient is an appropriate candidate for preoperative chemoradiation treatment.   I discussed with the patient the rationale of radiation treatment in this setting. I discussed the benefit in terms of local/regional control and we also discussed how this can aid surgical resection. We also discussed the potential side effects and risks of treatment as well.   All of the patient's questions were answered. The patient wishes to proceed with radiation treatment.   PLAN: The patient will proceed with a simulation in the near future such that we can proceed with treatment planning. I anticipate treating the patient to 50.4 Gy in 5 1/2 weeks. This will correspond to a 3-D conformal technique with daily optical guidance and weekly port films to help ensure accurate localization of the target volume. I anticipate beginning this treatment on 03/13/2014. The patient also is seeing medical oncology and concurrent chemotherapy will be coordinated. It is my understanding that the patient also is being set up to see surgery in the near future.      ________________________________   Jodelle Gross, MD, PhD   **Disclaimer: This note was dictated with voice recognition software. Similar sounding words can inadvertently be transcribed and this note may contain transcription errors which may not have been corrected upon publication of note.**

## 2014-04-01 NOTE — Telephone Encounter (Signed)
gave avs & calendar for March

## 2014-04-01 NOTE — Progress Notes (Signed)
Met with patient during new patient visit. Explained the role of the GI Nurse Navigator and provided New Patient Packet with information on: 1.  Rectal cancer 2. Support groups 3. Advanced Directives 4. Fall Safety Plan Answered questions, reviewed current treatment plan using TEACH back and provided emotional support. Provided copy of current treatment plan. 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  Merceda Elks, RN, BSN GI Oncology [place]

## 2014-04-04 ENCOUNTER — Encounter: Payer: Self-pay | Admitting: Hematology

## 2014-04-04 ENCOUNTER — Telehealth: Payer: Self-pay | Admitting: Hematology

## 2014-04-04 ENCOUNTER — Other Ambulatory Visit: Payer: Self-pay | Admitting: *Deleted

## 2014-04-04 NOTE — Telephone Encounter (Signed)
Lft msg for pt confirming MD visit per 02/29 POF, edit notes for 03/01 to give pt updated schedule w/MD visit for 03/07... KJ

## 2014-04-04 NOTE — Progress Notes (Signed)
Per [facility] Capecitabine 500 mg tablet 180/30 was denied by medicare part D. She was approved thru medical medicare part B

## 2014-04-05 ENCOUNTER — Other Ambulatory Visit (HOSPITAL_BASED_OUTPATIENT_CLINIC_OR_DEPARTMENT_OTHER): Payer: Commercial Managed Care - HMO

## 2014-04-05 ENCOUNTER — Other Ambulatory Visit: Payer: Commercial Managed Care - HMO

## 2014-04-05 ENCOUNTER — Other Ambulatory Visit: Payer: Self-pay | Admitting: *Deleted

## 2014-04-05 DIAGNOSIS — K625 Hemorrhage of anus and rectum: Secondary | ICD-10-CM

## 2014-04-05 DIAGNOSIS — C20 Malignant neoplasm of rectum: Secondary | ICD-10-CM

## 2014-04-05 LAB — COMPREHENSIVE METABOLIC PANEL (CC13)
ALBUMIN: 3.6 g/dL (ref 3.5–5.0)
ALT: 26 U/L (ref 0–55)
AST: 20 U/L (ref 5–34)
Alkaline Phosphatase: 41 U/L (ref 40–150)
Anion Gap: 12 mEq/L — ABNORMAL HIGH (ref 3–11)
BUN: 12.9 mg/dL (ref 7.0–26.0)
CHLORIDE: 106 meq/L (ref 98–109)
CO2: 25 mEq/L (ref 22–29)
Calcium: 9.5 mg/dL (ref 8.4–10.4)
Creatinine: 0.7 mg/dL (ref 0.6–1.1)
EGFR: >90 mL/min/{1.73_m2} (ref >90)
Glucose: 164 mg/dl — ABNORMAL HIGH (ref 70–140)
POTASSIUM: 4.5 meq/L (ref 3.5–5.1)
Sodium: 142 mEq/L (ref 136–145)
Total Bilirubin: 0.39 mg/dL (ref 0.20–1.20)
Total Protein: 6.4 g/dL (ref 6.4–8.3)

## 2014-04-05 LAB — CBC & DIFF AND RETIC
BASO%: 0.2 % (ref 0.0–2.0)
Basophils Absolute: 0 10*3/uL (ref 0.0–0.1)
EOS ABS: 0.1 10*3/uL (ref 0.0–0.5)
EOS%: 1.5 % (ref 0.0–7.0)
HCT: 36.7 % (ref 34.8–46.6)
HEMOGLOBIN: 12.8 g/dL (ref 11.6–15.9)
Immature Retic Fract: 5.6 % (ref 1.60–10.00)
LYMPH%: 22.9 % (ref 14.0–49.7)
MCH: 31.7 pg (ref 25.1–34.0)
MCHC: 34.9 g/dL (ref 31.5–36.0)
MCV: 90.8 fL (ref 79.5–101.0)
MONO#: 0.5 10*3/uL (ref 0.1–0.9)
MONO%: 7.9 % (ref 0.0–14.0)
NEUT#: 4 10*3/uL (ref 1.5–6.5)
NEUT%: 67.5 % (ref 38.4–76.8)
PLATELETS: 117 10*3/uL — AB (ref 145–400)
RBC: 4.04 10*6/uL (ref 3.70–5.45)
RDW: 13.2 % (ref 11.2–14.5)
RETIC %: 2.26 % — AB (ref 0.70–2.10)
Retic Ct Abs: 91.3 10*3/uL — ABNORMAL HIGH (ref 33.70–90.70)
WBC: 5.9 10*3/uL (ref 3.9–10.3)
lymph#: 1.4 10*3/uL (ref 0.9–3.3)

## 2014-04-05 LAB — FERRITIN CHCC: Ferritin: 29 ng/ml (ref 9–269)

## 2014-04-06 ENCOUNTER — Telehealth: Payer: Self-pay | Admitting: *Deleted

## 2014-04-06 ENCOUNTER — Encounter: Payer: Self-pay | Admitting: Hematology

## 2014-04-06 ENCOUNTER — Ambulatory Visit
Admission: RE | Admit: 2014-04-06 | Discharge: 2014-04-06 | Disposition: A | Discharge status: Home / Self Care | Payer: Commercial Managed Care - HMO | Source: Ambulatory Visit | Attending: Radiation Oncology | Admitting: Radiation Oncology

## 2014-04-06 DIAGNOSIS — C20 Malignant neoplasm of rectum: Secondary | ICD-10-CM | POA: Diagnosis not present

## 2014-04-06 NOTE — Progress Notes (Signed)
  Radiation Oncology         (336) [phone] ________________________________  Name: [name] MRN: [id number]  Date: 04/06/2014  DOB: [birth year]  SIMULATION AND TREATMENT PLANNING NOTE  The patient presented for simulation for the patient's upcoming course of preoperative radiation for the diagnosis of rectal cancer. The patient was placed in a supine position. A customized vac-lock bag was constructed toaid in patient immobilization. This complex treatment device will be used on a daily basis during the treatment. In this fashion a CT scan was obtained through the pelvic region and the isocenter was placed near midline within the pelvis.  The patient will initially be planned to receive a course of radiation to a dose of 45 Gy. This will be accomplished in 25 fractions at 1.8 gray per fraction. This initial treatment will correspond to a 3-D conformal technique. The gross tumor volume has been contoured in addition to the rectum, bladder and femoral heads. DVH's of each of these structures have been requested and these will be carefully reviewed as part of the 3-D conformal treatment planning process. To accomplish this initial treatment, 4 customized blocks have been designed for this purpose. Each of these 4 complex treatment devices will be used on a daily basis during the initial course of his treatment. It is anticipated that the patient will then receive a boost for an additional 5.4 Gy. The anticipated total dose therefore will be 50.4 Gy.  Special treatment procedure The patient will receive chemotherapy during the course of radiation treatment. The patient may experience increased or overlapping toxicity due to this combined-modality approach and the patient will be monitored for such problems. This may include extra lab work as necessary. This therefore constitutes a special treatment procedure.    ________________________________  Jodelle Gross, MD, PhD

## 2014-04-06 NOTE — Telephone Encounter (Signed)
Spoke with Carmelina Noun in managed care: Script has been sent today to [facility] (per [facility] requirement) and they have a co pay assist program. Patient was told by [facility] that her co pay is around $600 for #180 tablets.  Left VM for Gabrielle to let her know the status of things and that we are working on it. If she is not able to start the pills till a day or two after her RT starts that won't be a problem. We will keep her up to date on status and requested she do the same.

## 2014-04-06 NOTE — Progress Notes (Signed)
Faxed xeloda prescription to [facility] so see if they can help with copay

## 2014-04-07 ENCOUNTER — Telehealth: Payer: Self-pay | Admitting: *Deleted

## 2014-04-07 ENCOUNTER — Other Ambulatory Visit: Payer: Self-pay | Admitting: *Deleted

## 2014-04-07 DIAGNOSIS — C20 Malignant neoplasm of rectum: Secondary | ICD-10-CM | POA: Diagnosis not present

## 2014-04-07 NOTE — Telephone Encounter (Signed)
[facility] called for clarification of Xeloda prescription--whether continuous or Monday through Friday.  Contacted Dr. Burr Medico who said she will take it Monday through Friday.  Dr. Burr Medico asked that we determine whether the patient will receive this medication in time to start it on Monday 04/11/14 with her radiation therapy.  The pharmacy will expedite the benefit verification and call me back to let us know whether it is approved, what the patient's co-pay is and whether they can get to the patient in time.  Awaiting return call from [place].

## 2014-04-07 NOTE — Telephone Encounter (Signed)
[place] and spoke with Vivica in the oncology dept. Inquiring about pt's Xeloda prescription.  Informed Vivica that pt will need to start Xeloda on Monday when pt starts radiation. Vivica stated she would send urgent message to her leader to expedite prescription fill process.  Gave Vivica direct nurses' phone number to update progress. [place]

## 2014-04-07 NOTE — Telephone Encounter (Signed)
Spoke with Aaron Edelman, pharmacist @ [facility].  Was informed that pt's copay will be $ 0.00 for Xeloda Aaron Edelman has been working with Medicaid to obtain Xeloda for pt ).  Aaron Edelman stated he would be contacting pt to let her know when to pick up Xeloda. Aaron Edelman called nurse back, and informed nurse that pt stated she would pick up Xeloda on Monday 04/11/14 in the am prior to coming in for office visit with Dr. Burr Medico.

## 2014-04-08 ENCOUNTER — Other Ambulatory Visit: Payer: Self-pay | Admitting: Family Medicine

## 2014-04-08 ENCOUNTER — Encounter: Payer: Self-pay | Admitting: *Deleted

## 2014-04-08 NOTE — Progress Notes (Signed)
[place] Psychosocial Distress Screening Clinical Social Work  Clinical Social Work was referred by distress screening protocol.  The patient scored a 6 on the Psychosocial Distress Thermometer which indicates moderate distress. Clinical Social Worker phoned pt to assess for distress and other psychosocial needs. 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 CSW discussed how she could possibly arranged CNA for family friend to ease her care giving burden while she is going through treatment. CSW reviewed resources of Pt and Family Support Team, GI Group and other sources of support. Pt may attend group and is "relieved to know there is help if she needs it'. She agrees to call or come see CSW as needed.   ONCBCN DISTRESS SCREENING 03/30/2014  Screening Type Initial Screening  Distress experienced in past week (1-10) 6  Family Problem type Children  Emotional problem type Nervousness/Anxiety  Information Concerns Type Lack of info about treatment  Physical Problem type Constipation/diarrhea  Physician notified of physical symptoms Yes  Referral to clinical psychology No  Referral to clinical social work Yes  Referral to dietition No  Referral to financial advocate No  Referral to support programs No  Referral to palliative care No    Clinical Social Worker follow up needed: No.  If yes, follow up plan:  Loren Racer, [place]  [facility] Phone: [phone] Fax: [phone]

## 2014-04-11 ENCOUNTER — Ambulatory Visit
Admission: RE | Admit: 2014-04-11 | Discharge: 2014-04-11 | Disposition: A | Discharge status: Home / Self Care | Payer: Commercial Managed Care - HMO | Source: Ambulatory Visit | Attending: Radiation Oncology | Admitting: Radiation Oncology

## 2014-04-11 ENCOUNTER — Encounter: Payer: Self-pay | Admitting: Hematology

## 2014-04-11 ENCOUNTER — Telehealth: Payer: Self-pay | Admitting: Hematology

## 2014-04-11 ENCOUNTER — Ambulatory Visit (HOSPITAL_BASED_OUTPATIENT_CLINIC_OR_DEPARTMENT_OTHER): Payer: Commercial Managed Care - HMO | Admitting: Hematology

## 2014-04-11 VITALS — BP 135/58 | HR 86 | Temp 98.4°F | Resp 18 | Ht 60.0 in | Wt 162.1 lb

## 2014-04-11 DIAGNOSIS — I10 Essential (primary) hypertension: Secondary | ICD-10-CM

## 2014-04-11 DIAGNOSIS — C20 Malignant neoplasm of rectum: Secondary | ICD-10-CM | POA: Diagnosis not present

## 2014-04-11 DIAGNOSIS — E119 Type 2 diabetes mellitus without complications: Secondary | ICD-10-CM | POA: Diagnosis not present

## 2014-04-11 DIAGNOSIS — D696 Thrombocytopenia, unspecified: Secondary | ICD-10-CM

## 2014-04-11 NOTE — Telephone Encounter (Signed)
Pt confirmed labs/ov per 03/07 POF, 03/28 no YF/LT availability put on [place] schedule and per MD if there is no availability for MD besides the week she is on vac. advised me to put on her schedule.... KJ

## 2014-04-11 NOTE — Progress Notes (Signed)
[place]  Telephone:([phone]) [phone] [phone]  Clinic New Consult Note   Patient Care Team: Eulas Post, MD as PCP - General Jacolyn Reedy, MD as Consulting Physician (Cardiology) Ladene Artist, MD as Consulting Physician (Gastroenterology) [name], MD as Consulting Physician (General Surgery) Kyung Rudd, MD as Consulting Physician (Radiation Oncology) 04/11/2014  CHIEF COMPLAINTS:  Follow up rectal cancer    Oncology History   Rectal cancer   Staging form: Colon and Rectum, AJCC 7th Edition     Clinical: Stage IIA (T3, N0, M0) - Signed by Truitt Merle, MD on 03/31/2014       Rectal cancer   03/11/2014 Initial Diagnosis Rectal cancer, cT3N0   03/11/2014 Pathology Results Rectal mass biopsy showed invasive adenocarcinoma. Ascending colon polyp biopsy showed tubulovillous adenoma.   03/11/2014 Procedure Colonoscopy showed 3 sessiile polyp (2 in ascending, 1 in descending colon), not removed,  and a rectal mass measuring 4 x 3 cm. EUS showed T3N0 disease    03/18/2014 Imaging CT CAP showed lower rectal wall thickening, no metastatic disease or adenopathy.     HISTORY OF PRESENTING ILLNESS:  [name] 64 y.o. female is here because of newly diagnosed rectal cancer.  She has had rectal bleeding for 3 month, blood on surface of stool, 1/2-1 tea spoon each time, intermittent, no rectal pain or other complains. She feels well overall. She denies nausea, abdominal discomfort, change of her appetite or weight loss. She was evaluated by her PCP and was referred to Dr. Fuller Plan. Her appointment was rescheduled three times due to multiple reasons. She finally had colonoscopy on 03/07/2014, which showed a half circumferential firm mass at the rectum, measuring 4 x 3 cm,, it extended from 7-11 cm in the mid rectum. Additional 2 sessile polyps in the ascending colon and 1 sessile polyp in the sigmoid colon were found and biopsied. The rectal mass biopsy showed  adenocarcinoma. EUS was done by Dr. Ardis Hughs showed a T3 lesion, no regional adenopathy. She was seen by colorectal surgeon Dr. Marcello Moores, and was referred to radiation oncologist Dr. Lisbeth Renshaw and the me to discuss neoadjuvant chemotherapy radiation.  She feels well overall, has good appetite and eats well, she has mild fatigue since her CABG 2 years ago.  No chest pain or dyspnea. No significant weight loss recently.   INTERIM HISTORY She came in today to start chemo and radiation. She feels well overall. She noticed a small blister in her inner low lip (she might bit her lip), no other complains. Her son was discharged from hospital after CABG and is currently staying with her. His son's girl friend is coming up to help out.   MEDICAL HISTORY:  Past Medical History  Diagnosis Date  . Diabetes mellitus, insulin dependent (IDDM), uncontrolled   . Hyperlipidemia   . Hypertensive heart disease   .  Paroxysmal SVT (ANVRT)     S/p AV nodal ablation Dr. Lovena Le 09/28/08   . Depression   . CAD     Coronary disease status post non-ST elevation MI in May 2010 with  PCI of the left circumflex on Jun 24, 2008. She then had a PCI of  the RCA on July 15, 2008. 02/24/12 Cath, Severe 95% LAD, ostial 95% circ, ostial RCA, patent stents in distal RCA and mid circ normal LV 02/26/12 CABG with LIMA to LAD, SVG to RCA, and SVG to OM Dr. Roxan Hockey    . Cataract   . Cancer  adenocarcinoma of rectum    SURGICAL HISTORY: Past Surgical History  Procedure Laterality Date  . Coronary angioplasty with stent placement    . Coronary artery bypass graft  02/26/2012    Roxan Hockey, MD;  Location: [facility];  Service: Open Heart Surgery;  Laterality: N/A;  CABG x three,  using left internal mammary artery  and left leg greater saphenous vein,   . Endovein harvest of greater saphenous vein  02/26/2012  . Left heart catheterization with coronary angiogram N/A 02/24/2012    Procedure: LEFT HEART CATHETERIZATION WITH CORONARY  ANGIOGRAM;  Surgeon: Jacolyn Reedy, MD;  Location: [facility] CATH LAB;  Service: Cardiovascular;  Laterality: N/A;  . Eus N/A 03/24/2014    Procedure: LOWER ENDOSCOPIC ULTRASOUND (EUS);  Surgeon: Milus Banister, MD;  Location: [facility];  Service: Endoscopy;  Laterality: N/A;  . Colonoscopy      SOCIAL HISTORY: History   Social History  . Marital Status: Divorced    Spouse Name: N/A  . Number of Children: One son at age 13  . Years of Education: N/A   Occupational History  . Not on file.   Social History Main Topics  . Smoking status: Never Smoker   . Smokeless tobacco: Never Used  . Alcohol Use: No  . Drug Use: No  . Sexual Activity: No   Other Topics Concern  .  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 Colon cancer Neg Hx   . Diabetes Father   . Diabetes Brother   . Diabetes Sister   . Colon polyps Maternal Grandmother     ALLERGIES:  has No Known Allergies.  MEDICATIONS:  Current Outpatient Prescriptions  Medication Sig Dispense Refill  . aspirin 81 MG tablet Take 81 mg by mouth at bedtime.     Marland Kitchen atorvastatin (LIPITOR) 20 MG tablet TAKE ONE TABLET BY MOUTH ONCE DAILY AT BEDTIME 90 tablet 3  . capecitabine (XELODA) 500 MG tablet Take 3 tablets (1,500 mg total) by mouth 2 (two) times daily after a meal. 90 tablet 1  . Coral Calcium 1000 (390 CA) MG TABS Take 1,000 mg by mouth 2 (two) times daily.    . fish oil-omega-3 fatty acids 1000 MG capsule Take 2 g by mouth 2 (two) times daily.     . furosemide (LASIX) 40 MG tablet Take 40 mg by mouth daily as needed for fluid or edema.    . insulin aspart (NOVOLOG) 100 UNIT/ML injection Inject 10 units before each meal.    . insulin detemir (LEVEMIR) 100 UNIT/ML injection Inject 96 Units into the skin at bedtime.     . metFORMIN (GLUCOPHAGE) 1000 MG tablet TAKE ONE TABLET BY MOUTH TWICE DAILY WITH  A  MEAL 60 tablet 5  . metoprolol tartrate (LOPRESSOR)  25 MG tablet Take 25 mg by mouth 2 (two) times daily.    . sertraline (ZOLOFT) 50 MG tablet Take 1 tablet (50 mg total) by mouth at bedtime. 30 tablet 3   No current facility-administered medications for this visit.    REVIEW OF SYSTEMS:   Constitutional: Denies fevers, chills or abnormal night sweats Eyes: Denies blurriness of vision, double vision or watery eyes Ears, nose, mouth, throat, and face: Denies mucositis or sore throat Respiratory: Denies cough, dyspnea or wheezes Cardiovascular: Denies palpitation, chest discomfort or lower extremity swelling Gastrointestinal:  Denies nausea, heartburn or change in bowel habits Skin: Denies abnormal  skin rashes Lymphatics: Denies new lymphadenopathy or easy bruising Neurological:Denies numbness, tingling or new weaknesses Behavioral/Psych: Mood is stable, no new changes  All other systems were reviewed with the patient and are negative.  PHYSICAL EXAMINATION: ECOG PERFORMANCE STATUS: 0 - Asymptomatic  Filed Vitals:   04/11/14 1308  BP: 135/58  Pulse: 86  Temp: 98.4 F (36.9 C)  Resp: 18   Filed Weights   04/11/14 1308  Weight: 162 lb 1.6 oz (73.528 kg)    GENERAL:alert, no distress and comfortable SKIN: skin color, texture, turgor are normal, no rashes or significant lesions EYES: normal, conjunctiva are pink and non-injected, sclera clear OROPHARYNX:no exudate, no erythema and lips, buccal mucosa, and tongue normal  NECK: supple, thyroid normal size, non-tender, without nodularity LYMPH:  no palpable lymphadenopathy in the cervical, axillary or inguinal LUNGS: clear to auscultation and percussion with normal breathing effort HEART: regular rate & rhythm and no murmurs and no lower extremity edema ABDOMEN:abdomen soft, non-tender and normal bowel sounds Musculoskeletal:no cyanosis of digits and no clubbing  PSYCH: alert & oriented x 3 with fluent speech NEURO: no focal motor/sensory deficits  LABORATORY DATA:  I have  reviewed the data as listed Lab Results  Component Value Date   WBC 5.9 04/05/2014   HGB 12.8 04/05/2014   HCT 36.7 04/05/2014   MCV 90.8 04/05/2014   PLT 117* 04/05/2014    Recent Labs  03/17/14 1044 04/05/14 1057  NA  --  142  K  --  4.5  CO2  --  25  GLUCOSE  --  164*  BUN 14 12.9  CREATININE 0.49 0.7  CALCIUM  --  9.5  PROT  --  6.4  ALBUMIN  --  3.6  AST  --  20  ALT  --  26  ALKPHOS  --  41  BILITOT  --  0.39   PATHOLOGY REPORT: FINAL DIAGNOSIS Diagnosis 03/11/2014 1. Surgical [P], ascending, biopsy MULTIPLE FRAGMENTS OF TUBULOVILLOUS ADENOMA. PLEASE SEE COMMENT. 2. Surgical [P], sigmoid, polyp HYPERPLASTIC POLYP(S). NO ADENOMATOUS CHANGE OR MALIGNANCY IDENTIFIED. 3. Surgical [P], rectum, biopsy INVASIVE ADENOCARCINOMA. PLEASE SEE COMMENT. Microscopic Comment 1. There is adenomatous epithelium having both tubular and villous growth patterns consistent with a tubulovillous adenoma if the biopsy is representative of the entire lesion. No high grade dysplasia or evidence of malignancy is identified. Clinical correlation is recommended. It is not clear whether this represents the entire lesion noted clinically or whether this is a portion of a larger lesion in which case the changes here may or may not be representative. 3. There are malignant glandular cells arranged in acinar and cribriform patterns with associated desmoplastic stromal reaction. The findings are diagnostic for invasive adenocarcinoma. Case was discussed with Dr. Fuller Plan on 03-15-2014.  RADIOGRAPHIC STUDIES: I have personally reviewed the radiological images as listed and agreed with the findings in the report.  Ct Abdomen Pelvis W Contrast 03/18/2014    IMPRESSION: Lower rectal wall thickening, likely corresponding to known rectal adenocarcinoma.  No findings suspicious for metastatic disease in the chest, abdomen, or pelvis.   Electronically Signed   By: Julian Hy M.D.   On: 03/18/2014 16:06     Colonoscopy 03/11/2014: COLON FINDINGS: A sessile polyp measuring 18 mm in size was found in the ascending colon. It was not removed due to the adjacent large polyp whcih will require surgical resection. A sessile polyp measuring 40 mm in size was found in the ascending colon. Multiple biopsies were performed. Injection (tattooing) was  performed. It was not amenable to endoscopic removal. A sessile polyp measuring 6 mm in size was found in the sigmoid colon. A polypectomy was performed with a cold snare. The resection was complete, the polyp tissue was completely retrieved and sent to histology. A half circumferential firm mass, measuring 4 X 3cm in size, was found in the rectum. It extended from 7-11 cm in the mid rectum. Multiple biopsies of the lesion were performed. The examination was otherwise normal. Retroflexed views revealed no abnormalities. The time to cecum=4 minutes 28 seconds. Withdrawal time=19 minutes 30 seconds. The scope was withdrawn and the procedure completed. COMPLICATIONS: There were no immediate complications. ENDOSCOPIC IMPRESSION: 1. Sessile polyp in the ascending colon-not removed 2. Sessile polyp in the ascending colon; not removed; multiple biopsies performed; Injection (tattooing) performed 3. Sessile polyp in the sigmoid colon; polypectomy performed with a cold snare 4. Rectal mass, measuring 4 X 3 cm; multiple biopsies of the lesion performed  EUS findings: 1. The mass above corresponds with a hypoechoic lesion that measures 1.5cm in depth, clearly passes into and through the muscularis propria layer of the rectal wall (uT3). 2. No perirectal adenopathy (uN0). ENDOSCOPIC IMPRESSION: 4cm long, non-circumferential, uT3N0 (Stage IIa) rectal adenocarcinoma that lays along the right anterior wall of the rectum wtih distal edge 8cm from the anal verge.  ASSESSMENT & PLAN:  64 year old Caucasian female, with past medical history of hypertension, diabetes,  multivessel coronary artery disease, status post 2 stents placement and triple bypass 2 years ago. She presented with rectal bleeding.  1. Rectal adenocarcinoma, cT3N0M0, stage IIA  -I reviewed her imaging, endoscopical, and biopsy findings with her in great details. She has a clinica stage II a mid rectal adenocarcinoma, and the goal of care is curative.  -for T3/T4 rectal cancer, the standard care is neoadjuvant concurrent chemoradiation followed by surgery, and possible adjuvant chemotherapy. -giving her her history of coronary artery disease, I'm little concerned about vascular spasm from Capecitabine. I have spoken with cardiologist Dr. Wynonia Lawman who felt she does not need a stress test before chemo, because her CABG was just 2 years ago and she is asymptomatic.  -She is starting radiation along with capecitabine 1500mg  q12hr (M-F) today. -Weekly lab with CBC and CMP, and follow-up.  2. Mild thrombocytopenia -Intermittent for the past few years since her CABG -We'll follow CBC closely when she is on chemo   2. Hypertension, diabetes, coronary artery disease -She will continue follow-up with her primary care physician and cardiologist -We discussed the risk of coronary artery spasm with chemotherapy.  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 Her son was recently hospitalized and a is going to live with her after discharge and would need some care also. His girl friend will come to live with them  -We discussed she may need social support and help during her concurrent chemoradiation -We will refer to our social worker   Plan - Weekly lab CBC and CMP -She will follow-up with me or APP Lattie Haw every week.  All questions were answered. The patient knows to call the clinic with any problems, questions or concerns. I spent 20 minutes counseling the patient face to face. The total time spent in the appointment was 25 minutes and more than 50%  was on counseling.     Truitt Merle, MD 04/11/2014 1:28 PM

## 2014-04-12 ENCOUNTER — Ambulatory Visit
Admission: RE | Admit: 2014-04-12 | Discharge: 2014-04-12 | Disposition: A | Discharge status: Home / Self Care | Payer: Commercial Managed Care - HMO | Source: Ambulatory Visit | Attending: Radiation Oncology | Admitting: Radiation Oncology

## 2014-04-12 ENCOUNTER — Encounter: Payer: Self-pay | Admitting: *Deleted

## 2014-04-12 DIAGNOSIS — C20 Malignant neoplasm of rectum: Secondary | ICD-10-CM | POA: Diagnosis not present

## 2014-04-12 NOTE — Progress Notes (Signed)
[place] Work  Clinical Social Work was referred by patient for transportation concerns assessment of psychosocial needs.  Clinical Social Worker met with patient in office at [facility] to offer support and assess for needs.  Patient recently began radiation treatment and feels comfortable driving herself, but is concerned for how treatment will affect her ability to drive.  CSW and patient reviewed transportation resources.  Due to living outside the [place] city limits there are limited resources.  CSW and patient discussed [facility] and patient agreed to CSW making a referral.  CSW and patient also called her insurance company [facility]) to determine if patient has transportation benefits.  Patient is eligable for 24 trips or 12 round trips per year through her insurance benefit.  Insurance representative explained scheduling process and contact information.  Patient verbalized understanding.  With the information provided patient was agreeable to exploring if [facility] could provided any assistance before scheduling transportation through her insurance benefit ([employer] [phone]).  Patient also expressed concerns for her caregiver duties at home.  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  CSW provided support to patient encouraging her to call sons physicians regarding assistance in the home, and the [place] to determine benefits available for her elderly friend.  CSW made notes for patient containing all information, provided contact information and encouraged her to call with questions or concerns.         Johnnye Lana, MSW, LCSW, OSW-C Clinical Social Worker [facility] [phone]

## 2014-04-13 ENCOUNTER — Ambulatory Visit
Admission: RE | Admit: 2014-04-13 | Discharge: 2014-04-13 | Disposition: A | Discharge status: Home / Self Care | Payer: Commercial Managed Care - HMO | Source: Ambulatory Visit | Attending: Radiation Oncology | Admitting: Radiation Oncology

## 2014-04-13 ENCOUNTER — Encounter: Payer: Self-pay | Admitting: Radiation Oncology

## 2014-04-13 VITALS — BP 142/64 | HR 89 | Temp 98.4°F | Resp 20 | Wt 162.3 lb

## 2014-04-13 DIAGNOSIS — C20 Malignant neoplasm of rectum: Secondary | ICD-10-CM | POA: Diagnosis not present

## 2014-04-13 NOTE — Progress Notes (Signed)
   Department of Radiation Oncology  Phone:  [phone] Fax:        [phone]  Weekly Treatment Note    Name: [name] Date: 04/13/2014 MRN: [id number] DOB: 01/07/1951   Current dose: 5.4 Gy  Current fraction: 3   MEDICATIONS: Current Outpatient Prescriptions  Medication Sig Dispense Refill  . aspirin 81 MG tablet Take 81 mg by mouth at bedtime.     Marland Kitchen atorvastatin (LIPITOR) 20 MG tablet TAKE ONE TABLET BY MOUTH ONCE DAILY AT BEDTIME 90 tablet 3  . capecitabine (XELODA) 500 MG tablet Take 3 tablets (1,500 mg total) by mouth 2 (two) times daily after a meal. 90 tablet 1  . Coral Calcium 1000 (390 CA) MG TABS Take 1,000 mg by mouth 2 (two) times daily.    . fish oil-omega-3 fatty acids 1000 MG capsule Take 2 g by mouth 2 (two) times daily.     . furosemide (LASIX) 40 MG tablet Take 40 mg by mouth daily as needed for fluid or edema.    . insulin aspart (NOVOLOG) 100 UNIT/ML injection Inject 10 units before each meal.    . insulin detemir (LEVEMIR) 100 UNIT/ML injection Inject 96 Units into the skin at bedtime.     . metFORMIN (GLUCOPHAGE) 1000 MG tablet TAKE ONE TABLET BY MOUTH TWICE DAILY WITH  A  MEAL 60 tablet 5  . metoprolol tartrate (LOPRESSOR) 25 MG tablet Take 25 mg by mouth 2 (two) times daily.    . sertraline (ZOLOFT) 50 MG tablet Take 1 tablet (50 mg total) by mouth at bedtime. 30 tablet 3   No current facility-administered medications for this encounter.     ALLERGIES: Review of patient's allergies indicates no known allergies.   LABORATORY DATA:  Lab Results  Component Value Date   WBC 5.9 04/05/2014   HGB 12.8 04/05/2014   HCT 36.7 04/05/2014   MCV 90.8 04/05/2014   PLT 117* 04/05/2014   Lab Results  Component Value Date   NA 142 04/05/2014   K 4.5 04/05/2014   CL 99 02/27/2013   CO2 25 04/05/2014   Lab Results  Component Value Date   ALT 26 04/05/2014   AST 20 04/05/2014   ALKPHOS 41 04/05/2014   BILITOT 0.39 04/05/2014     NARRATIVE:  [name] was seen today for weekly treatment management. The chart was checked and the patient's films were reviewed.  The patient is doing very well in her first week of treatment. No difficulty so far. Questions were answered.  PHYSICAL EXAMINATION: weight is 162 lb 4.8 oz (73.619 kg). Her oral temperature is 98.4 F (36.9 C). Her blood pressure is 142/64 and her pulse is 89. Her respiration is 20.        ASSESSMENT: The patient is doing satisfactorily with treatment.  PLAN: We will continue with the patient's radiation treatment as planned.

## 2014-04-13 NOTE — Progress Notes (Signed)
Weekly rad tx 3rd /25 pelvis, pt education done, radiation theraay and you book, sitz bath, my business card, disp teach back given, cussed ways to manage side effects, pain, skin irritation, nausea, vomiting, diarrhea, imodium prn, low fiber diet, 50-6 smaller meals and snacks instead of 3 large meals, fatigue, urinary changes,  Baby wipes, spray bottle for skin irritation, increase protein in diet , drink plenty fluids, 6-8 glasses water daily,teach back given,all questions answered, no c/o pain, today 3:26 PM

## 2014-04-14 ENCOUNTER — Ambulatory Visit
Admission: RE | Admit: 2014-04-14 | Discharge: 2014-04-14 | Disposition: A | Discharge status: Home / Self Care | Payer: Commercial Managed Care - HMO | Source: Ambulatory Visit | Attending: Radiation Oncology | Admitting: Radiation Oncology

## 2014-04-14 DIAGNOSIS — C20 Malignant neoplasm of rectum: Secondary | ICD-10-CM | POA: Diagnosis not present

## 2014-04-15 ENCOUNTER — Ambulatory Visit
Admission: RE | Admit: 2014-04-15 | Discharge: 2014-04-15 | Disposition: A | Discharge status: Home / Self Care | Payer: Commercial Managed Care - HMO | Source: Ambulatory Visit | Attending: Radiation Oncology | Admitting: Radiation Oncology

## 2014-04-15 ENCOUNTER — Encounter: Payer: Self-pay | Admitting: Radiation Oncology

## 2014-04-15 DIAGNOSIS — C20 Malignant neoplasm of rectum: Secondary | ICD-10-CM | POA: Diagnosis not present

## 2014-04-18 ENCOUNTER — Other Ambulatory Visit: Payer: Commercial Managed Care - HMO

## 2014-04-18 ENCOUNTER — Ambulatory Visit: Payer: Commercial Managed Care - HMO | Admitting: Hematology

## 2014-04-18 ENCOUNTER — Ambulatory Visit
Admission: RE | Admit: 2014-04-18 | Discharge: 2014-04-18 | Disposition: A | Discharge status: Home / Self Care | Payer: Commercial Managed Care - HMO | Source: Ambulatory Visit | Attending: Radiation Oncology | Admitting: Radiation Oncology

## 2014-04-18 DIAGNOSIS — C20 Malignant neoplasm of rectum: Secondary | ICD-10-CM | POA: Diagnosis not present

## 2014-04-19 ENCOUNTER — Other Ambulatory Visit (HOSPITAL_BASED_OUTPATIENT_CLINIC_OR_DEPARTMENT_OTHER): Payer: Commercial Managed Care - HMO

## 2014-04-19 ENCOUNTER — Ambulatory Visit (HOSPITAL_BASED_OUTPATIENT_CLINIC_OR_DEPARTMENT_OTHER): Payer: Commercial Managed Care - HMO | Admitting: Hematology

## 2014-04-19 ENCOUNTER — Encounter: Payer: Self-pay | Admitting: Hematology

## 2014-04-19 ENCOUNTER — Ambulatory Visit
Admission: RE | Admit: 2014-04-19 | Discharge: 2014-04-19 | Disposition: A | Discharge status: Home / Self Care | Payer: Commercial Managed Care - HMO | Source: Ambulatory Visit | Attending: Radiation Oncology | Admitting: Radiation Oncology

## 2014-04-19 VITALS — BP 137/60 | HR 81 | Temp 98.6°F | Resp 18 | Ht 60.0 in | Wt 158.7 lb

## 2014-04-19 DIAGNOSIS — C20 Malignant neoplasm of rectum: Secondary | ICD-10-CM

## 2014-04-19 DIAGNOSIS — D696 Thrombocytopenia, unspecified: Secondary | ICD-10-CM | POA: Diagnosis not present

## 2014-04-19 LAB — CBC & DIFF AND RETIC
BASO%: 0.2 % (ref 0.0–2.0)
Basophils Absolute: 0 10*3/uL (ref 0.0–0.1)
EOS%: 1.1 % (ref 0.0–7.0)
Eosinophils Absolute: 0.1 10*3/uL (ref 0.0–0.5)
HCT: 37.2 % (ref 34.8–46.6)
HGB: 13.1 g/dL (ref 11.6–15.9)
IMMATURE RETIC FRACT: 10.6 % — AB (ref 1.60–10.00)
LYMPH#: 0.8 10*3/uL — AB (ref 0.9–3.3)
LYMPH%: 17.4 % (ref 14.0–49.7)
MCH: 32.2 pg (ref 25.1–34.0)
MCHC: 35.2 g/dL (ref 31.5–36.0)
MCV: 91.4 fL (ref 79.5–101.0)
MONO#: 0.3 10*3/uL (ref 0.1–0.9)
MONO%: 6.2 % (ref 0.0–14.0)
NEUT#: 3.4 10*3/uL (ref 1.5–6.5)
NEUT%: 75.1 % (ref 38.4–76.8)
Platelets: 119 10*3/uL — ABNORMAL LOW (ref 145–400)
RBC: 4.07 10*6/uL (ref 3.70–5.45)
RDW: 13.3 % (ref 11.2–14.5)
RETIC CT ABS: 90.76 10*3/uL — AB (ref 33.70–90.70)
Retic %: 2.23 % — ABNORMAL HIGH (ref 0.70–2.10)
WBC: 4.5 10*3/uL (ref 3.9–10.3)

## 2014-04-19 LAB — COMPREHENSIVE METABOLIC PANEL (CC13)
ALT: 23 U/L (ref 0–55)
AST: 14 U/L (ref 5–34)
Albumin: 3.7 g/dL (ref 3.5–5.0)
Alkaline Phosphatase: 36 U/L — ABNORMAL LOW (ref 40–150)
Anion Gap: 11 mEq/L (ref 3–11)
BUN: 15.2 mg/dL (ref 7.0–26.0)
CHLORIDE: 103 meq/L (ref 98–109)
CO2: 25 mEq/L (ref 22–29)
CREATININE: 0.7 mg/dL (ref 0.6–1.1)
Calcium: 9.5 mg/dL (ref 8.4–10.4)
EGFR: >90 mL/min/{1.73_m2} (ref >90)
GLUCOSE: 171 mg/dL — AB (ref 70–140)
Potassium: 4.3 mEq/L (ref 3.5–5.1)
Sodium: 140 mEq/L (ref 136–145)
Total Bilirubin: 0.45 mg/dL (ref 0.20–1.20)
Total Protein: 6.7 g/dL (ref 6.4–8.3)

## 2014-04-19 NOTE — Progress Notes (Signed)
[place]  Telephone:([phone]) [phone] Fax:([phone]) [phone]  Clinic New Consult Note   Patient Care Team: Eulas Post, MD as PCP - General Jacolyn Reedy, MD as Consulting Physician (Cardiology) Ladene Artist, MD as Consulting Physician (Gastroenterology) [name], MD as Consulting Physician (General Surgery) Kyung Rudd, MD as Consulting Physician (Radiation Oncology) 04/19/2014  CHIEF COMPLAINTS:  Follow up rectal cancer    Oncology History   Rectal cancer   Staging form: Colon and Rectum, AJCC 7th Edition     Clinical: Stage IIA (T3, N0, M0) - Signed by Truitt Merle, MD on 03/31/2014       Rectal cancer   03/11/2014 Initial Diagnosis Rectal cancer, cT3N0   03/11/2014 Pathology Results Rectal mass biopsy showed invasive adenocarcinoma. Ascending colon polyp biopsy showed tubulovillous adenoma.   03/11/2014 Procedure Colonoscopy showed 3 sessiile polyp (2 in ascending, 1 in descending colon), not removed,  and a rectal mass measuring 4 x 3 cm. EUS showed T3N0 disease    03/18/2014 Imaging CT CAP showed lower rectal wall thickening, no metastatic disease or adenopathy.     HISTORY OF PRESENTING ILLNESS:  [name] 64 y.o. female is here because of newly diagnosed rectal cancer.  She has had rectal bleeding for 3 month, blood on surface of stool, 1/2-1 tea spoon each time, intermittent, no rectal pain or other complains. She feels well overall. She denies nausea, abdominal discomfort, change of her appetite or weight loss. She was evaluated by her PCP and was referred to Dr. Fuller Plan. Her appointment was rescheduled three times due to multiple reasons. She finally had colonoscopy on 03/07/2014, which showed a half circumferential firm mass at Danielle rectum, measuring 4 x 3 cm,, it extended from 7-11 cm in Danielle mid rectum. Additional 2 sessile polyps in Danielle ascending colon and 1 sessile polyp in Danielle sigmoid colon were found and biopsied. Danielle rectal mass biopsy showed  adenocarcinoma. EUS was done by Dr. Ardis Hughs showed a T3 lesion, no regional adenopathy. She was seen by colorectal surgeon Dr. Marcello Moores, and was referred to radiation oncologist Dr. Lisbeth Renshaw and Danielle me to discuss neoadjuvant chemotherapy radiation.  She feels well overall, has good appetite and eats well, she has mild fatigue since her CABG 2 years ago.  No chest pain or dyspnea. No significant weight loss recently.   INTERIM HISTORY She came in today for follow-up. She is on second week of concurrent chemoradiation. She is tolerating treatment very well, no noticeable side effects. She has good appetite and energy level. She had a few episodes of diarrhea, stopped on its own, no constipation. No abdominal pain nausea or other complaints.  MEDICAL HISTORY:  Past Medical History  Diagnosis Date  . Diabetes mellitus, insulin dependent (IDDM), uncontrolled   . Hyperlipidemia   . Hypertensive heart disease   .  Paroxysmal SVT (ANVRT)     S/p AV nodal ablation Dr. Lovena Le 09/28/08   . Depression   . CAD     Coronary disease status post non-ST elevation MI in May 2010 with  PCI of Danielle left circumflex on Jun 24, 2008. She then had a PCI of  Danielle RCA on July 15, 2008. 02/24/12 Cath, Severe 95% LAD, ostial 95% circ, ostial RCA, patent stents in distal RCA and mid circ normal LV 02/26/12 CABG with LIMA to LAD, SVG to RCA, and SVG to OM Dr. Roxan Hockey    . Cataract   . Cancer     adenocarcinoma of rectum  SURGICAL HISTORY: Past Surgical History  Procedure Laterality Date  . Coronary angioplasty with stent placement    . Coronary artery bypass graft  02/26/2012    Roxan Hockey, MD;  Location: [place];  Service: Open Heart Surgery;  Laterality: N/A;  CABG x three,  using left internal mammary artery  and left leg greater saphenous vein,   . Endovein harvest of greater saphenous vein  02/26/2012  . Left heart catheterization with coronary angiogram N/A 02/24/2012    Procedure: LEFT HEART CATHETERIZATION WITH  CORONARY ANGIOGRAM;  Surgeon: Jacolyn Reedy, MD;  Location: [facility] CATH LAB;  Service: Cardiovascular;  Laterality: N/A;  . Eus N/A 03/24/2014    Procedure: LOWER ENDOSCOPIC ULTRASOUND (EUS);  Surgeon: Milus Banister, MD;  Location: [facility];  Service: Endoscopy;  Laterality: N/A;  . Colonoscopy      SOCIAL HISTORY: History   Social History  . Marital Status: Divorced    Spouse Hart: N/A  . Number of Children: One son at age 48  . Years of Education: N/A   Occupational History  . Not on file.   Social History Main Topics  . Smoking status: Never Smoker   . Smokeless tobacco: Never Used  . Alcohol Use: No  . Drug Use: No  . Sexual Activity: No   Other Topics Concern  .  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 Colon cancer Neg Hx   . Diabetes Father   . Diabetes Brother   . Diabetes Sister   . Colon polyps Maternal Grandmother     ALLERGIES:  has No Known Allergies.  MEDICATIONS:  Current Outpatient Prescriptions  Medication Sig Dispense Refill  . aspirin 81 MG tablet Take 81 mg by mouth at bedtime.     Marland Kitchen atorvastatin (LIPITOR) 20 MG tablet TAKE ONE TABLET BY MOUTH ONCE DAILY AT BEDTIME 90 tablet 3  . capecitabine (XELODA) 500 MG tablet Take 3 tablets (1,500 mg total) by mouth 2 (two) times daily after a meal. 90 tablet 1  . Coral Calcium 1000 (390 CA) MG TABS Take 1,000 mg by mouth 2 (two) times daily.    . fish oil-omega-3 fatty acids 1000 MG capsule Take 2 g by mouth 2 (two) times daily.     . furosemide (LASIX) 40 MG tablet Take 40 mg by mouth daily as needed for fluid or edema.    . insulin aspart (NOVOLOG) 100 UNIT/ML injection Inject 10 units before each meal.    . insulin detemir (LEVEMIR) 100 UNIT/ML injection Inject 96 Units into Danielle skin at bedtime.     . metFORMIN (GLUCOPHAGE) 1000 MG tablet TAKE ONE TABLET BY MOUTH TWICE DAILY WITH  A  MEAL 60 tablet 5  . metoprolol tartrate  (LOPRESSOR) 25 MG tablet Take 25 mg by mouth 2 (two) times daily.    . sertraline (ZOLOFT) 50 MG tablet Take 1 tablet (50 mg total) by mouth at bedtime. 30 tablet 3   No current facility-administered medications for this visit.    REVIEW OF SYSTEMS:   Constitutional: Denies fevers, chills or abnormal night sweats Eyes: Denies blurriness of vision, double vision or watery eyes Ears, nose, mouth, throat, and face: Denies mucositis or sore throat Respiratory: Denies cough, dyspnea or wheezes Cardiovascular: Denies palpitation, chest discomfort or lower extremity swelling Gastrointestinal:  Denies nausea, heartburn or change in bowel habits Skin: Denies abnormal skin rashes Lymphatics: Denies new lymphadenopathy  or easy bruising Neurological:Denies numbness, tingling or new weaknesses Behavioral/Psych: Mood is stable, no new changes  All other systems were reviewed with Danielle patient and are negative.  PHYSICAL EXAMINATION: ECOG PERFORMANCE STATUS: 0 - Asymptomatic  Filed Vitals:   04/19/14 1604  BP: 137/60  Pulse: 81  Temp: 98.6 F (37 C)  Resp: 18   Filed Weights   04/19/14 1604  Weight: 158 lb 11.2 oz (71.986 kg)    GENERAL:alert, no distress and comfortable SKIN: skin color, texture, turgor are normal, no rashes or significant lesions EYES: normal, conjunctiva are pink and non-injected, sclera clear OROPHARYNX:no exudate, no erythema and lips, buccal mucosa, and tongue normal  NECK: supple, thyroid normal size, non-tender, without nodularity LYMPH:  no palpable lymphadenopathy in Danielle cervical, axillary or inguinal LUNGS: clear to auscultation and percussion with normal breathing effort HEART: regular rate & rhythm and no murmurs and no lower extremity edema ABDOMEN:abdomen soft, non-tender and normal bowel sounds Musculoskeletal:no cyanosis of digits and no clubbing  PSYCH: alert & oriented x 3 with fluent speech NEURO: no focal motor/sensory deficits  LABORATORY DATA:    I have reviewed Danielle data as listed Lab Results  Component Value Date   WBC 4.5 04/19/2014   HGB 13.1 04/19/2014   HCT 37.2 04/19/2014   MCV 91.4 04/19/2014   PLT 119* 04/19/2014    Recent Labs  03/17/14 1044 04/05/14 1057 04/19/14 1453  NA  --  142 140  K  --  4.5 4.3  CO2  --  25 25  GLUCOSE  --  164* 171*  BUN 14 12.9 15.2  CREATININE 0.49 0.7 0.7  CALCIUM  --  9.5 9.5  PROT  --  6.4 6.7  ALBUMIN  --  3.6 3.7  AST  --  20 14  ALT  --  26 23  ALKPHOS  --  41 36*  BILITOT  --  0.39 0.45   PATHOLOGY REPORT: FINAL DIAGNOSIS Diagnosis 03/11/2014 1. Surgical [P], ascending, biopsy MULTIPLE FRAGMENTS OF TUBULOVILLOUS ADENOMA. PLEASE SEE COMMENT. 2. Surgical [P], sigmoid, polyp HYPERPLASTIC POLYP(S). NO ADENOMATOUS CHANGE OR MALIGNANCY IDENTIFIED. 3. Surgical [P], rectum, biopsy INVASIVE ADENOCARCINOMA. PLEASE SEE COMMENT. Microscopic Comment 1. There is adenomatous epithelium having both tubular and villous growth patterns consistent with a tubulovillous adenoma if Danielle biopsy is representative of Danielle entire lesion. No high grade dysplasia or evidence of malignancy is identified. Clinical correlation is recommended. It is not clear whether this represents Danielle entire lesion noted clinically or whether this is a portion of a larger lesion in which case Danielle changes here may or may not be representative. 3. There are malignant glandular cells arranged in acinar and cribriform patterns with associated desmoplastic stromal reaction. Danielle findings are diagnostic for invasive adenocarcinoma. Case was discussed with Dr. Fuller Plan on 03-15-2014.  RADIOGRAPHIC STUDIES: I have personally reviewed Danielle radiological images as listed and agreed with Danielle findings in Danielle report.  Ct Abdomen Pelvis W Contrast 03/18/2014    IMPRESSION: Lower rectal wall thickening, likely corresponding to known rectal adenocarcinoma.  No findings suspicious for metastatic disease in Danielle chest, abdomen, or pelvis.    Electronically Signed   By: Julian Hy M.D.   On: 03/18/2014 16:06   Colonoscopy 03/11/2014: COLON FINDINGS: A sessile polyp measuring 18 mm in size was found in Danielle ascending colon. It was not removed due to Danielle adjacent large polyp whcih will require surgical resection. A sessile polyp measuring 40 mm in size was found in Danielle  ascending colon. Multiple biopsies were performed. Injection (tattooing) was performed. It was not amenable to endoscopic removal. A sessile polyp measuring 6 mm in size was found in Danielle sigmoid colon. A polypectomy was performed with a cold snare. Danielle resection was complete, Danielle polyp tissue was completely retrieved and sent to histology. A half circumferential firm mass, measuring 4 X 3cm in size, was found in Danielle rectum. It extended from 7-11 cm in Danielle mid rectum. Multiple biopsies of Danielle lesion were performed. Danielle examination was otherwise normal. Retroflexed views revealed no abnormalities. Danielle time to cecum=4 minutes 28 seconds. Withdrawal time=19 minutes 30 seconds. Danielle scope was withdrawn and Danielle procedure completed. COMPLICATIONS: There were no immediate complications. ENDOSCOPIC IMPRESSION: 1. Sessile polyp in Danielle ascending colon-not removed 2. Sessile polyp in Danielle ascending colon; not removed; multiple biopsies performed; Injection (tattooing) performed 3. Sessile polyp in Danielle sigmoid colon; polypectomy performed with a cold snare 4. Rectal mass, measuring 4 X 3 cm; multiple biopsies of Danielle lesion performed  EUS findings: 1. Danielle mass above corresponds with a hypoechoic lesion that measures 1.5cm in depth, clearly passes into and through Danielle muscularis propria layer of Danielle rectal wall (uT3). 2. No perirectal adenopathy (uN0). ENDOSCOPIC IMPRESSION: 4cm long, non-circumferential, uT3N0 (Stage IIa) rectal adenocarcinoma that lays along Danielle right anterior wall of Danielle rectum wtih distal edge 8cm from Danielle anal verge.  ASSESSMENT & PLAN:  64 year old  Caucasian female, with past medical history of hypertension, diabetes, multivessel coronary artery disease, status post 2 stents placement and triple bypass 2 years ago. She presented with rectal bleeding.  1. Rectal adenocarcinoma, cT3N0M0, stage IIA  -I reviewed her imaging, endoscopical, and biopsy findings with her in great details. She has a clinica stage II a mid rectal adenocarcinoma, and Danielle goal of care is curative.  -for T3/T4 rectal cancer, Danielle standard care is neoadjuvant concurrent chemoradiation followed by surgery, and possible adjuvant chemotherapy. -giving her her history of coronary artery disease, I'm little concerned about vascular spasm from Capecitabine. I have spoken with cardiologist Dr. Wynonia Lawman who felt she does not need a stress test before chemo, because her CABG was Danielle 2 years ago and she is asymptomatic.  -She is on week 2 radiation along with capecitabine 1500mg  q12hr (M-F) today, doing well.  -Weekly lab with CBC and CMP, and follow-up.  2. Mild thrombocytopenia -Intermittent for Danielle past few years since her CABG -We'll follow CBC closely when she is on chemo   2. Hypertension, diabetes, coronary artery disease -She will continue follow-up with her primary care physician and cardiologist -We discussed Danielle risk of coronary artery spasm with chemotherapy.  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 Her son was recently hospitalized and a is going to live with her after discharge and would need some care also. His girl friend will come to live with them  -We discussed she may need social support and help during her concurrent chemoradiation   Plan - Weekly lab CBC and CMP -She will follow-up with me or APP Lattie Haw every week.  All questions were answered. Danielle patient knows to call Danielle clinic with any problems, questions or concerns. I spent 10 minutes counseling Danielle patient face to face. Danielle total time spent in  Danielle appointment was 15 minutes and more than 50% was on counseling.     Truitt Merle, MD 04/19/2014 4:51 PM

## 2014-04-20 ENCOUNTER — Ambulatory Visit
Admission: RE | Admit: 2014-04-20 | Discharge: 2014-04-20 | Disposition: A | Discharge status: Home / Self Care | Payer: Commercial Managed Care - HMO | Source: Ambulatory Visit | Attending: Radiation Oncology | Admitting: Radiation Oncology

## 2014-04-20 DIAGNOSIS — C20 Malignant neoplasm of rectum: Secondary | ICD-10-CM | POA: Diagnosis not present

## 2014-04-21 ENCOUNTER — Ambulatory Visit
Admission: RE | Admit: 2014-04-21 | Discharge: 2014-04-21 | Disposition: A | Discharge status: Home / Self Care | Payer: Commercial Managed Care - HMO | Source: Ambulatory Visit | Attending: Radiation Oncology | Admitting: Radiation Oncology

## 2014-04-21 DIAGNOSIS — C20 Malignant neoplasm of rectum: Secondary | ICD-10-CM | POA: Diagnosis not present

## 2014-04-22 ENCOUNTER — Ambulatory Visit
Admission: RE | Admit: 2014-04-22 | Discharge: 2014-04-22 | Disposition: A | Discharge status: Home / Self Care | Payer: Commercial Managed Care - HMO | Source: Ambulatory Visit | Attending: Radiation Oncology | Admitting: Radiation Oncology

## 2014-04-22 ENCOUNTER — Encounter: Payer: Self-pay | Admitting: Radiation Oncology

## 2014-04-22 VITALS — BP 150/58 | HR 79 | Temp 98.3°F | Resp 20 | Wt 159.0 lb

## 2014-04-22 DIAGNOSIS — C20 Malignant neoplasm of rectum: Secondary | ICD-10-CM

## 2014-04-22 NOTE — Progress Notes (Signed)
Weekly rad txs rectal 10 treatments completed, no c/o pain, nausea, gas or diarrhea, having rgular bowel movements daily, appetite good 3:04 PM

## 2014-04-22 NOTE — Progress Notes (Signed)
   Department of Radiation Oncology  Phone:  [phone] Fax:        [phone]  Weekly Treatment Note    Name: [name] Date: 04/22/2014 MRN: [id number] DOB: 1950/07/25   Current dose: 18 Gy  Current fraction: 10   MEDICATIONS: Current Outpatient Prescriptions  Medication Sig Dispense Refill  . aspirin 81 MG tablet Take 81 mg by mouth at bedtime.     Marland Kitchen atorvastatin (LIPITOR) 20 MG tablet TAKE ONE TABLET BY MOUTH ONCE DAILY AT BEDTIME 90 tablet 3  . capecitabine (XELODA) 500 MG tablet Take 3 tablets (1,500 mg total) by mouth 2 (two) times daily after a meal. 90 tablet 1  . Coral Calcium 1000 (390 CA) MG TABS Take 1,000 mg by mouth 2 (two) times daily.    . fish oil-omega-3 fatty acids 1000 MG capsule Take 2 g by mouth 2 (two) times daily.     . furosemide (LASIX) 40 MG tablet Take 40 mg by mouth daily as needed for fluid or edema.    . insulin aspart (NOVOLOG) 100 UNIT/ML injection Inject 10 units before each meal.    . insulin detemir (LEVEMIR) 100 UNIT/ML injection Inject 96 Units into the skin at bedtime.     . metFORMIN (GLUCOPHAGE) 1000 MG tablet TAKE ONE TABLET BY MOUTH TWICE DAILY WITH  A  MEAL 60 tablet 5  . metoprolol tartrate (LOPRESSOR) 25 MG tablet Take 25 mg by mouth 2 (two) times daily.    . sertraline (ZOLOFT) 50 MG tablet Take 1 tablet (50 mg total) by mouth at bedtime. 30 tablet 3   No current facility-administered medications for this encounter.     ALLERGIES: Review of patient's allergies indicates no known allergies.   LABORATORY DATA:  Lab Results  Component Value Date   WBC 4.5 04/19/2014   HGB 13.1 04/19/2014   HCT 37.2 04/19/2014   MCV 91.4 04/19/2014   PLT 119* 04/19/2014   Lab Results  Component Value Date   NA 140 04/19/2014   K 4.3 04/19/2014   CL 99 02/27/2013   CO2 25 04/19/2014   Lab Results  Component Value Date   ALT 23 04/19/2014   AST 14 04/19/2014   ALKPHOS 36* 04/19/2014   BILITOT 0.45 04/19/2014      NARRATIVE: [name] was seen today for weekly treatment management. The chart was checked and the patient's films were reviewed.  Weekly rad txs rectal 10 treatments completed, no c/o pain, nausea, gas or diarrhea, having rgular bowel movements daily, appetite good  PHYSICAL EXAMINATION: weight is 159 lb (72.122 kg). Her oral temperature is 98.3 F (36.8 C). Her blood pressure is 150/58 and her pulse is 79. Her respiration is 20.        ASSESSMENT: The patient is doing satisfactorily with treatment.  PLAN: We will continue with the patient's radiation treatment as planned.

## 2014-04-25 ENCOUNTER — Ambulatory Visit
Admission: RE | Admit: 2014-04-25 | Discharge: 2014-04-25 | Disposition: A | Discharge status: Home / Self Care | Payer: Commercial Managed Care - HMO | Source: Ambulatory Visit | Attending: Radiation Oncology | Admitting: Radiation Oncology

## 2014-04-25 ENCOUNTER — Other Ambulatory Visit: Payer: Self-pay | Admitting: Pharmacist

## 2014-04-25 ENCOUNTER — Other Ambulatory Visit (HOSPITAL_BASED_OUTPATIENT_CLINIC_OR_DEPARTMENT_OTHER): Payer: Commercial Managed Care - HMO

## 2014-04-25 ENCOUNTER — Ambulatory Visit (HOSPITAL_BASED_OUTPATIENT_CLINIC_OR_DEPARTMENT_OTHER): Payer: Commercial Managed Care - HMO | Admitting: Nurse Practitioner

## 2014-04-25 ENCOUNTER — Ambulatory Visit: Payer: Commercial Managed Care - HMO | Admitting: Nutrition

## 2014-04-25 VITALS — BP 139/60 | HR 87 | Temp 98.5°F | Resp 18 | Ht 60.0 in | Wt 158.3 lb

## 2014-04-25 DIAGNOSIS — C20 Malignant neoplasm of rectum: Secondary | ICD-10-CM

## 2014-04-25 DIAGNOSIS — D696 Thrombocytopenia, unspecified: Secondary | ICD-10-CM

## 2014-04-25 LAB — COMPREHENSIVE METABOLIC PANEL (CC13)
ALBUMIN: 3.7 g/dL (ref 3.5–5.0)
ALT: 27 U/L (ref 0–55)
AST: 16 U/L (ref 5–34)
Alkaline Phosphatase: 41 U/L (ref 40–150)
Anion Gap: 15 mEq/L — ABNORMAL HIGH (ref 3–11)
BUN: 15.7 mg/dL (ref 7.0–26.0)
CALCIUM: 9.7 mg/dL (ref 8.4–10.4)
CO2: 25 mEq/L (ref 22–29)
Chloride: 101 mEq/L (ref 98–109)
Creatinine: 0.7 mg/dL (ref 0.6–1.1)
EGFR: 86 mL/min/{1.73_m2} — AB (ref >90)
GLUCOSE: 244 mg/dL — AB (ref 70–140)
Potassium: 4 mEq/L (ref 3.5–5.1)
Sodium: 141 mEq/L (ref 136–145)
Total Bilirubin: 0.52 mg/dL (ref 0.20–1.20)
Total Protein: 6.7 g/dL (ref 6.4–8.3)

## 2014-04-25 LAB — CBC & DIFF AND RETIC
BASO%: 0.3 % (ref 0.0–2.0)
Basophils Absolute: 0 10*3/uL (ref 0.0–0.1)
EOS ABS: 0.1 10*3/uL (ref 0.0–0.5)
EOS%: 1.6 % (ref 0.0–7.0)
HCT: 37.1 % (ref 34.8–46.6)
HGB: 13.3 g/dL (ref 11.6–15.9)
IMMATURE RETIC FRACT: 6.9 % (ref 1.60–10.00)
LYMPH#: 0.6 10*3/uL — AB (ref 0.9–3.3)
LYMPH%: 14.5 % (ref 14.0–49.7)
MCH: 32.4 pg (ref 25.1–34.0)
MCHC: 35.8 g/dL (ref 31.5–36.0)
MCV: 90.5 fL (ref 79.5–101.0)
MONO#: 0.3 10*3/uL (ref 0.1–0.9)
MONO%: 8.8 % (ref 0.0–14.0)
NEUT%: 74.8 % (ref 38.4–76.8)
NEUTROS ABS: 2.9 10*3/uL (ref 1.5–6.5)
Platelets: 97 10*3/uL — ABNORMAL LOW (ref 145–400)
RBC: 4.1 10*6/uL (ref 3.70–5.45)
RDW: 13.9 % (ref 11.2–14.5)
RETIC CT ABS: 89.79 10*3/uL (ref 33.70–90.70)
Retic %: 2.19 % — ABNORMAL HIGH (ref 0.70–2.10)
WBC: 3.9 10*3/uL (ref 3.9–10.3)

## 2014-04-25 NOTE — Progress Notes (Signed)
  [place] OFFICE PROGRESS NOTE   Diagnosis:  Rectal cancer Oncology History   Rectal cancer  Staging form: Colon and Rectum, AJCC 7th Edition  Clinical: Stage IIA (T3, N0, M0) - Signed by Truitt Merle, MD on 03/31/2014       Rectal cancer   03/11/2014 Initial Diagnosis Rectal cancer, cT3N0   03/11/2014 Pathology Results Rectal mass biopsy showed invasive adenocarcinoma. Ascending colon polyp biopsy showed tubulovillous adenoma.   03/11/2014 Procedure Colonoscopy showed 3 sessiile polyp (2 in ascending, 1 in descending colon), not removed, and a rectal mass measuring 4 x 3 cm. EUS showed T3N0 disease    03/18/2014 Imaging CT CAP showed lower rectal wall thickening, no metastatic disease or adenopathy.          INTERVAL HISTORY:   [name] returns as scheduled. She continues radiation/Xeloda. She denies nausea/vomiting. No mouth sores. She continues to have loose stools. She estimates 4 bowel movements a day. No rectal bleeding for the past several days. She denies rectal pain. No hand or foot pain or redness.  Objective:  Vital signs in last 24 hours:  Blood pressure 139/60, pulse 87, temperature 98.5 F (36.9 C), temperature source Oral, resp. rate 18, height 5' (1.524 m), weight 158 lb 4.8 oz (71.804 kg), SpO2 98 %.    HEENT: No thrush or ulcerations. Resp: Lungs clear bilaterally. Cardio: Regular rate and rhythm. GI: Abdomen soft and nontender. No hepatomegaly. Vascular: No leg edema. Calves nontender.  Skin: Palms without erythema.    Lab Results:  Lab Results  Component Value Date   WBC 3.9 04/25/2014   HGB 13.3 04/25/2014   HCT 37.1 04/25/2014   MCV 90.5 04/25/2014   PLT 97* 04/25/2014   NEUTROABS 2.9 04/25/2014    Imaging:  No results found.  Medications: I have reviewed the patient's current medications.  Assessment/Plan: 1. Rectal adenocarcinoma, cT3N0M0, stage IIA. Initiation of concurrent radiation/Xeloda  04/11/2014. 2. Mild thrombocytopenia. Intermittent for the past 2 years since her CABG. 3. Hypertension, diabetes, CAD.   Disposition: [name] appears to be tolerating treatment well thus far. We discussed the baseline mild thrombocytopenia. She understands to contact the office with any bleeding. We will continue to see her weekly during the course of treatment with labs.  Plan reviewed with Dr. Burr Medico.  Ned Card ANP/GNP-BC   04/25/2014  2:03 PM

## 2014-04-25 NOTE — Progress Notes (Signed)
64 year old female diagnosed with rectal cancer.  She is a patient of Dr. Burr Medico.  Past medical history includes IDDM, hyperlipidemia, depression, CAD, CABG.  Medications include Lipitor, Xeloda, fish oil, calcium, Lasix, NovoLog, Glucophage, and Zoloft.  Labs include glucose of 171.  Height: 5 feet 0 inches. Weight: 158.7 pounds. Usual body weight: 162 pounds. BMI: 30.99.  Patient reports good appetite with good oral intake. She does report fatigue. She reports diarrhea with 4-5 loose stools daily. Patient typically consumes 3-4 meals daily.  Diet appears to be somewhat low in protein.  Nutrition diagnosis: Food and nutrition related knowledge deficit related to rectal cancer and associated treatments as evidenced by no prior need for nutrition related information.  Intervention:  Patient educated to increase protein at meals and snacks.  Provided fact sheet on increasing calories and protein. Educated patient on strategies for improving diarrhea and provided fact sheet. Encouraged patient to strive for weight maintenance. Questions were answered.  Teach back method used.  Monitoring, evaluation, goals:  Patient will tolerate adequate calories and protein  to promote weight maintenance with minimal side effects.  Next visit: Patient prefers to contact me with questions or concerns.    **Disclaimer: This note was dictated with voice recognition software. Similar sounding words can inadvertently be transcribed and this note may contain transcription errors which may not have been corrected upon publication of note.**

## 2014-04-26 ENCOUNTER — Other Ambulatory Visit: Payer: Self-pay | Admitting: *Deleted

## 2014-04-26 ENCOUNTER — Encounter: Payer: Self-pay | Admitting: *Deleted

## 2014-04-26 ENCOUNTER — Ambulatory Visit
Admission: RE | Admit: 2014-04-26 | Discharge: 2014-04-26 | Disposition: A | Discharge status: Home / Self Care | Payer: Commercial Managed Care - HMO | Source: Ambulatory Visit | Attending: Radiation Oncology | Admitting: Radiation Oncology

## 2014-04-26 DIAGNOSIS — C20 Malignant neoplasm of rectum: Secondary | ICD-10-CM | POA: Diagnosis not present

## 2014-04-26 NOTE — Progress Notes (Signed)
POF to scheduler to move patient from Dr. Burr Medico to Ned Card, NP on 05/09/14 per Dr. Burr Medico.

## 2014-04-27 ENCOUNTER — Telehealth: Payer: Self-pay | Admitting: Hematology

## 2014-04-27 ENCOUNTER — Ambulatory Visit
Admission: RE | Admit: 2014-04-27 | Discharge: 2014-04-27 | Disposition: A | Discharge status: Home / Self Care | Payer: Commercial Managed Care - HMO | Source: Ambulatory Visit | Attending: Radiation Oncology | Admitting: Radiation Oncology

## 2014-04-27 DIAGNOSIS — C20 Malignant neoplasm of rectum: Secondary | ICD-10-CM | POA: Diagnosis not present

## 2014-04-27 NOTE — Telephone Encounter (Signed)
Per 03/22 POF, moved MD visit to LT/NP, sent pt msg on my chart.... KJ

## 2014-04-28 ENCOUNTER — Ambulatory Visit
Admission: RE | Admit: 2014-04-28 | Discharge: 2014-04-28 | Disposition: A | Discharge status: Home / Self Care | Payer: Commercial Managed Care - HMO | Source: Ambulatory Visit | Attending: Radiation Oncology | Admitting: Radiation Oncology

## 2014-04-28 DIAGNOSIS — C20 Malignant neoplasm of rectum: Secondary | ICD-10-CM | POA: Diagnosis not present

## 2014-04-29 ENCOUNTER — Ambulatory Visit
Admission: RE | Admit: 2014-04-29 | Discharge: 2014-04-29 | Disposition: A | Discharge status: Home / Self Care | Payer: Commercial Managed Care - HMO | Source: Ambulatory Visit | Attending: Radiation Oncology | Admitting: Radiation Oncology

## 2014-04-29 ENCOUNTER — Encounter: Payer: Self-pay | Admitting: Radiation Oncology

## 2014-04-29 VITALS — BP 135/67 | HR 79 | Temp 98.3°F | Resp 20 | Wt 161.3 lb

## 2014-04-29 DIAGNOSIS — C20 Malignant neoplasm of rectum: Secondary | ICD-10-CM | POA: Diagnosis not present

## 2014-04-29 NOTE — Progress Notes (Signed)
   Department of Radiation Oncology  Phone:  [phone] Fax:        [phone]  Weekly Treatment Note    Name: [name] Date: 04/29/2014 MRN: [id number] DOB: [birth year]   Current dose: 27 Gy  Current fraction: 15   MEDICATIONS: Current Outpatient Prescriptions  Medication Sig Dispense Refill  . aspirin 81 MG tablet Take 81 mg by mouth at bedtime.     . capecitabine (XELODA) 500 MG tablet Take 3 tablets (1,500 mg total) by mouth 2 (two) times daily after a meal. 90 tablet 1  . Coral Calcium 1000 (390 CA) MG TABS Take 1,000 mg by mouth 2 (two) times daily.    . fish oil-omega-3 fatty acids 1000 MG capsule Take 2 g by mouth 2 (two) times daily.     . furosemide (LASIX) 40 MG tablet Take 40 mg by mouth daily as needed for fluid or edema.    . insulin aspart (NOVOLOG) 100 UNIT/ML injection Inject 10 units before each meal.    . insulin detemir (LEVEMIR) 100 UNIT/ML injection Inject 96 Units into the skin at bedtime.     . metFORMIN (GLUCOPHAGE) 1000 MG tablet TAKE ONE TABLET BY MOUTH TWICE DAILY WITH  A  MEAL 60 tablet 5  . metoprolol tartrate (LOPRESSOR) 25 MG tablet Take 25 mg by mouth 2 (two) times daily.    . sertraline (ZOLOFT) 50 MG tablet Take 1 tablet (50 mg total) by mouth at bedtime. 30 tablet 3  . atorvastatin (LIPITOR) 20 MG tablet TAKE ONE TABLET BY MOUTH ONCE DAILY AT BEDTIME (Patient not taking: Reported on 04/29/2014) 90 tablet 3   No current facility-administered medications for this encounter.     ALLERGIES: Review of patient's allergies indicates no known allergies.   LABORATORY DATA:  Lab Results  Component Value Date   WBC 3.9 04/25/2014   HGB 13.3 04/25/2014   HCT 37.1 04/25/2014   MCV 90.5 04/25/2014   PLT 97* 04/25/2014   Lab Results  Component Value Date   NA 141 04/25/2014   K 4.0 04/25/2014   CL 99 02/27/2013   CO2 25 04/25/2014   Lab Results  Component Value Date   ALT 27 04/25/2014   AST 16 04/25/2014   ALKPHOS 41 04/25/2014     BILITOT 0.52 04/25/2014     NARRATIVE: Secret Kristensen was seen today for weekly treatment management. The chart was checked and the patient's films were reviewed.  Weekly  Rad txs 15/25 treatments completed, rectum, has had 2 episodes diarrhea today has taken imodium, no nausea or gas stated, appetite good,  Energy level so so stated, vitals wnl, discussed low fiber diet again and imodium directions of use again, no c/o pain   PHYSICAL EXAMINATION: weight is 161 lb 4.8 oz (73.165 kg). Her oral temperature is 98.3 F (36.8 C). Her blood pressure is 135/67 and her pulse is 79. Her respiration is 20.        ASSESSMENT: The patient is doing satisfactorily with treatment.  PLAN: We will continue with the patient's radiation treatment as planned.

## 2014-04-29 NOTE — Progress Notes (Signed)
Weekly  Rad txs 15/25 treatments completed, rectum, has had 2 episodes diarrhea today has taken imodium, no nausea or gas stated, appetite good,  Energy level so so stated, vitals wnl, discussed low fiber diet again and imodium directions of use again, no c/o pain 3:03 PM

## 2014-05-02 ENCOUNTER — Ambulatory Visit (HOSPITAL_BASED_OUTPATIENT_CLINIC_OR_DEPARTMENT_OTHER): Payer: Commercial Managed Care - HMO | Admitting: Oncology

## 2014-05-02 ENCOUNTER — Ambulatory Visit
Admission: RE | Admit: 2014-05-02 | Discharge: 2014-05-02 | Disposition: A | Discharge status: Home / Self Care | Payer: Commercial Managed Care - HMO | Source: Ambulatory Visit | Attending: Radiation Oncology | Admitting: Radiation Oncology

## 2014-05-02 ENCOUNTER — Other Ambulatory Visit (HOSPITAL_BASED_OUTPATIENT_CLINIC_OR_DEPARTMENT_OTHER): Payer: Commercial Managed Care - HMO

## 2014-05-02 ENCOUNTER — Encounter: Payer: Self-pay | Admitting: Oncology

## 2014-05-02 VITALS — BP 122/66 | HR 84 | Temp 98.4°F | Resp 18 | Ht 60.0 in | Wt 161.5 lb

## 2014-05-02 DIAGNOSIS — I2510 Atherosclerotic heart disease of native coronary artery without angina pectoris: Secondary | ICD-10-CM | POA: Diagnosis not present

## 2014-05-02 DIAGNOSIS — C20 Malignant neoplasm of rectum: Secondary | ICD-10-CM

## 2014-05-02 DIAGNOSIS — E119 Type 2 diabetes mellitus without complications: Secondary | ICD-10-CM

## 2014-05-02 DIAGNOSIS — D696 Thrombocytopenia, unspecified: Secondary | ICD-10-CM | POA: Diagnosis not present

## 2014-05-02 DIAGNOSIS — I10 Essential (primary) hypertension: Secondary | ICD-10-CM

## 2014-05-02 LAB — COMPREHENSIVE METABOLIC PANEL (CC13)
ALBUMIN: 3.5 g/dL (ref 3.5–5.0)
ALK PHOS: 36 U/L — AB (ref 40–150)
ALT: 29 U/L (ref 0–55)
AST: 18 U/L (ref 5–34)
Anion Gap: 15 mEq/L — ABNORMAL HIGH (ref 3–11)
BILIRUBIN TOTAL: 0.51 mg/dL (ref 0.20–1.20)
BUN: 16.5 mg/dL (ref 7.0–26.0)
CO2: 22 mEq/L (ref 22–29)
Calcium: 9.5 mg/dL (ref 8.4–10.4)
Chloride: 102 mEq/L (ref 98–109)
Creatinine: 0.8 mg/dL (ref 0.6–1.1)
EGFR: 84 mL/min/{1.73_m2} — ABNORMAL LOW (ref >90)
Glucose: 199 mg/dl — ABNORMAL HIGH (ref 70–140)
Potassium: 4.6 mEq/L (ref 3.5–5.1)
SODIUM: 139 meq/L (ref 136–145)
TOTAL PROTEIN: 6.1 g/dL — AB (ref 6.4–8.3)

## 2014-05-02 LAB — CBC & DIFF AND RETIC
BASO%: 0 % (ref 0.0–2.0)
BASOS ABS: 0 10*3/uL (ref 0.0–0.1)
EOS ABS: 0 10*3/uL (ref 0.0–0.5)
EOS%: 1 % (ref 0.0–7.0)
HCT: 33.9 % — ABNORMAL LOW (ref 34.8–46.6)
HEMOGLOBIN: 12 g/dL (ref 11.6–15.9)
IMMATURE RETIC FRACT: 13 % — AB (ref 1.60–10.00)
LYMPH%: 10.7 % — ABNORMAL LOW (ref 14.0–49.7)
MCH: 32.9 pg (ref 25.1–34.0)
MCHC: 35.4 g/dL (ref 31.5–36.0)
MCV: 92.9 fL (ref 79.5–101.0)
MONO#: 0.4 10*3/uL (ref 0.1–0.9)
MONO%: 10.2 % (ref 0.0–14.0)
NEUT%: 78.1 % — AB (ref 38.4–76.8)
NEUTROS ABS: 3.2 10*3/uL (ref 1.5–6.5)
Platelets: 74 10*3/uL — ABNORMAL LOW (ref 145–400)
RBC: 3.65 10*6/uL — ABNORMAL LOW (ref 3.70–5.45)
RDW: 15.5 % — ABNORMAL HIGH (ref 11.2–14.5)
Retic %: 2.69 % — ABNORMAL HIGH (ref 0.70–2.10)
Retic Ct Abs: 98.19 10*3/uL — ABNORMAL HIGH (ref 33.70–90.70)
WBC: 4 10*3/uL (ref 3.9–10.3)
lymph#: 0.4 10*3/uL — ABNORMAL LOW (ref 0.9–3.3)

## 2014-05-02 MED ORDER — PROCHLORPERAZINE MALEATE 10 MG PO TABS: 10.0000 mg | ORAL_TABLET | Freq: Four times a day (QID) | ORAL | Status: DC | PRN

## 2014-05-02 NOTE — Progress Notes (Signed)
  [facility] OFFICE PROGRESS NOTE   Diagnosis:  Rectal cancer Oncology History   Rectal cancer  Staging form: Colon and Rectum, AJCC 7th Edition  Clinical: Stage IIA (T3, N0, M0) - Signed by Truitt Merle, MD on 03/31/2014       Rectal cancer   03/11/2014 Initial Diagnosis Rectal cancer, cT3N0   03/11/2014 Pathology Results Rectal mass biopsy showed invasive adenocarcinoma. Ascending colon polyp biopsy showed tubulovillous adenoma.   03/11/2014 Procedure Colonoscopy showed 3 sessiile polyp (2 in ascending, 1 in descending colon), not removed, and a rectal mass measuring 4 x 3 cm. EUS showed T3N0 disease    03/18/2014 Imaging CT CAP showed lower rectal wall thickening, no metastatic disease or adenopathy.          INTERVAL HISTORY:   [name] returns as scheduled. She continues radiation/Xeloda. She denies nausea/vomiting. No mouth sores. She continues to have loose stools. She estimates 4 bowel movements a day. Had increased diarrhea this past Friday evening, but improved over the weekend. Had waves of nausea this past weekend without vomiting. Does not have any antiemetics at home. No rectal bleeding for the past several days. She denies rectal pain. No hand or foot pain or redness.  Objective:  Vital signs in last 24 hours:  Blood pressure 122/66, pulse 84, temperature 98.4 F (36.9 C), temperature source Oral, resp. rate 18, height 5' (1.524 m), weight 161 lb 8 oz (73.256 kg), SpO2 97 %.    HEENT: No thrush or ulcerations. Resp: Lungs clear bilaterally. Cardio: Regular rate and rhythm. GI: Abdomen soft and nontender. No hepatomegaly. Vascular: No leg edema. Calves nontender.  Skin: Palms without erythema.    Lab Results:  Lab Results  Component Value Date   WBC 4.0 05/02/2014   HGB 12.0 05/02/2014   HCT 33.9* 05/02/2014   MCV 92.9 05/02/2014   PLT 74* 05/02/2014   NEUTROABS 3.2 05/02/2014    Imaging:  No results  found.  Medications: I have reviewed the patient's current medications.  Assessment/Plan: 1. Rectal adenocarcinoma, cT3N0M0, stage IIA. Initiation of concurrent radiation/Xeloda 04/11/2014. 2. Mild thrombocytopenia. Intermittent for the past 2 years since her CABG. 3. Hypertension, diabetes, CAD.   Disposition: [name] appears to be tolerating treatment well thus far. CBC reviewed with Dr. Benay Spice in Dr. Ernestina Penna absence. Will continue Xeloda at current dose. I have discussed the mild thrombocytopenia and recommendations with the patient. Rx for Compazine sent to local pharmacy for nausea. She understands to contact the office with any bleeding. We will continue to see her weekly during the course of treatment with labs.   Mikey Bussing, DNP, AGPCNP-BC, AOCNP   05/02/2014  2:59 PM

## 2014-05-03 ENCOUNTER — Ambulatory Visit
Admission: RE | Admit: 2014-05-03 | Discharge: 2014-05-03 | Disposition: A | Discharge status: Home / Self Care | Payer: Commercial Managed Care - HMO | Source: Ambulatory Visit | Attending: Radiation Oncology | Admitting: Radiation Oncology

## 2014-05-03 DIAGNOSIS — C20 Malignant neoplasm of rectum: Secondary | ICD-10-CM | POA: Diagnosis not present

## 2014-05-04 ENCOUNTER — Telehealth: Payer: Self-pay | Admitting: *Deleted

## 2014-05-04 ENCOUNTER — Encounter: Payer: Self-pay | Admitting: Radiation Oncology

## 2014-05-04 ENCOUNTER — Ambulatory Visit
Admission: RE | Admit: 2014-05-04 | Discharge: 2014-05-04 | Disposition: A | Discharge status: Home / Self Care | Payer: Commercial Managed Care - HMO | Source: Ambulatory Visit | Attending: Radiation Oncology | Admitting: Radiation Oncology

## 2014-05-04 VITALS — BP 135/59 | HR 79 | Temp 98.1°F | Resp 20 | Wt 161.0 lb

## 2014-05-04 DIAGNOSIS — R351 Nocturia: Secondary | ICD-10-CM | POA: Diagnosis not present

## 2014-05-04 DIAGNOSIS — C20 Malignant neoplasm of rectum: Secondary | ICD-10-CM

## 2014-05-04 DIAGNOSIS — Z510 Encounter for antineoplastic radiation therapy: Secondary | ICD-10-CM | POA: Diagnosis not present

## 2014-05-04 LAB — URINALYSIS, MICROSCOPIC - CHCC
Bilirubin (Urine): NEGATIVE
Blood: NEGATIVE
GLUCOSE UR CHCC: NEGATIVE mg/dL
KETONES: NEGATIVE mg/dL
Leukocyte Esterase: NEGATIVE
NITRITE: NEGATIVE
PH: 5 (ref 4.6–8.0)
Protein: NEGATIVE mg/dL
Specific Gravity, Urine: 1.02 (ref 1.003–1.035)
Urobilinogen, UR: 0.2 mg/dL (ref 0.2–1)

## 2014-05-04 NOTE — Telephone Encounter (Signed)
Called patient home left voice message lab results neg, to increas po fluids/water,can call for any questions 4:07 PM

## 2014-05-04 NOTE — Progress Notes (Addendum)
Weekly rad txs pelvis, 18/25 completed, no diarrhea or gas or nausea, takes Xeloda bid with meals is having dysuria, urgency frequency, nocturia severl times last night thi is new ,no bleeding or skin irritation stated, appetite good, tired 2:53 PM

## 2014-05-04 NOTE — Progress Notes (Signed)
Weekly Management Note Current Dose: 32.4 Gy  Projected Dose: 45 Gy   Narrative:  The patient presents for routine under treatment assessment.  CBCT/MVCT images/Port film x-rays were reviewed.  The chart was checked. More nocturia and burning. No fevers. No diarrhea since this weekend.   Physical Findings:  Unchanged  Vitals:  Filed Vitals:   05/04/14 1459  BP: 135/59  Pulse: 79  Temp: 98.1 F (36.7 C)  Resp: 20   Weight:  Wt Readings from Last 3 Encounters:  05/02/14 161 lb 8 oz (73.256 kg)  04/25/14 158 lb 4.8 oz (71.804 kg)  04/19/14 158 lb 11.2 oz (71.986 kg)   Lab Results  Component Value Date   WBC 4.0 05/02/2014   HGB 12.0 05/02/2014   HCT 33.9* 05/02/2014   MCV 92.9 05/02/2014   PLT 74* 05/02/2014   Lab Results  Component Value Date   CREATININE 0.8 05/02/2014   BUN 16.5 05/02/2014   NA 139 05/02/2014   K 4.6 05/02/2014   CL 99 02/27/2013   CO2 22 05/02/2014     Impression:  The patient is tolerating radiation.  Plan:  Continue treatment as planned. Continue xeloda. Check UA.

## 2014-05-05 ENCOUNTER — Ambulatory Visit
Admission: RE | Admit: 2014-05-05 | Discharge: 2014-05-05 | Disposition: A | Discharge status: Home / Self Care | Payer: Commercial Managed Care - HMO | Source: Ambulatory Visit | Attending: Radiation Oncology | Admitting: Radiation Oncology

## 2014-05-05 DIAGNOSIS — C20 Malignant neoplasm of rectum: Secondary | ICD-10-CM | POA: Diagnosis not present

## 2014-05-06 ENCOUNTER — Ambulatory Visit
Admission: RE | Admit: 2014-05-06 | Discharge: 2014-05-06 | Disposition: A | Discharge status: Home / Self Care | Payer: Commercial Managed Care - HMO | Source: Ambulatory Visit | Attending: Radiation Oncology | Admitting: Radiation Oncology

## 2014-05-06 DIAGNOSIS — C20 Malignant neoplasm of rectum: Secondary | ICD-10-CM | POA: Diagnosis not present

## 2014-05-09 ENCOUNTER — Ambulatory Visit (HOSPITAL_BASED_OUTPATIENT_CLINIC_OR_DEPARTMENT_OTHER): Payer: Commercial Managed Care - HMO | Admitting: Nurse Practitioner

## 2014-05-09 ENCOUNTER — Encounter: Payer: Self-pay | Admitting: Radiation Oncology

## 2014-05-09 ENCOUNTER — Ambulatory Visit
Admission: RE | Admit: 2014-05-09 | Discharge: 2014-05-09 | Disposition: A | Discharge status: Home / Self Care | Payer: Commercial Managed Care - HMO | Source: Ambulatory Visit | Attending: Radiation Oncology | Admitting: Radiation Oncology

## 2014-05-09 ENCOUNTER — Other Ambulatory Visit: Payer: Commercial Managed Care - HMO

## 2014-05-09 ENCOUNTER — Other Ambulatory Visit (HOSPITAL_BASED_OUTPATIENT_CLINIC_OR_DEPARTMENT_OTHER): Payer: Commercial Managed Care - HMO

## 2014-05-09 ENCOUNTER — Encounter: Payer: Commercial Managed Care - HMO | Admitting: Hematology

## 2014-05-09 VITALS — BP 134/56 | HR 81 | Temp 98.4°F | Resp 18 | Ht 60.0 in | Wt 159.8 lb

## 2014-05-09 DIAGNOSIS — D696 Thrombocytopenia, unspecified: Secondary | ICD-10-CM

## 2014-05-09 DIAGNOSIS — C20 Malignant neoplasm of rectum: Secondary | ICD-10-CM

## 2014-05-09 DIAGNOSIS — I10 Essential (primary) hypertension: Secondary | ICD-10-CM

## 2014-05-09 DIAGNOSIS — I25810 Atherosclerosis of coronary artery bypass graft(s) without angina pectoris: Secondary | ICD-10-CM

## 2014-05-09 DIAGNOSIS — E119 Type 2 diabetes mellitus without complications: Secondary | ICD-10-CM

## 2014-05-09 LAB — CBC & DIFF AND RETIC
BASO%: 0.3 % (ref 0.0–2.0)
Basophils Absolute: 0 10*3/uL (ref 0.0–0.1)
EOS%: 2.3 % (ref 0.0–7.0)
Eosinophils Absolute: 0.1 10*3/uL (ref 0.0–0.5)
HCT: 33.1 % — ABNORMAL LOW (ref 34.8–46.6)
HGB: 11.8 g/dL (ref 11.6–15.9)
Immature Retic Fract: 15.8 % — ABNORMAL HIGH (ref 1.60–10.00)
LYMPH%: 12.2 % — ABNORMAL LOW (ref 14.0–49.7)
MCH: 33.4 pg (ref 25.1–34.0)
MCHC: 35.6 g/dL (ref 31.5–36.0)
MCV: 93.8 fL (ref 79.5–101.0)
MONO#: 0.5 10*3/uL (ref 0.1–0.9)
MONO%: 12.5 % (ref 0.0–14.0)
NEUT#: 2.8 10*3/uL (ref 1.5–6.5)
NEUT%: 72.7 % (ref 38.4–76.8)
Platelets: 82 10*3/uL — ABNORMAL LOW (ref 145–400)
RBC: 3.53 10*6/uL — ABNORMAL LOW (ref 3.70–5.45)
RDW: 17.8 % — ABNORMAL HIGH (ref 11.2–14.5)
Retic %: 3.45 % — ABNORMAL HIGH (ref 0.70–2.10)
Retic Ct Abs: 121.79 10*3/uL — ABNORMAL HIGH (ref 33.70–90.70)
WBC: 3.8 10*3/uL — ABNORMAL LOW (ref 3.9–10.3)
lymph#: 0.5 10*3/uL — ABNORMAL LOW (ref 0.9–3.3)

## 2014-05-09 LAB — COMPREHENSIVE METABOLIC PANEL (CC13)
ALT: 29 U/L (ref 0–55)
AST: 21 U/L (ref 5–34)
Albumin: 3.7 g/dL (ref 3.5–5.0)
Alkaline Phosphatase: 43 U/L (ref 40–150)
Anion Gap: 12 mEq/L — ABNORMAL HIGH (ref 3–11)
BUN: 17.6 mg/dL (ref 7.0–26.0)
CO2: 24 mEq/L (ref 22–29)
Calcium: 9.4 mg/dL (ref 8.4–10.4)
Chloride: 103 mEq/L (ref 98–109)
Creatinine: 0.8 mg/dL (ref 0.6–1.1)
EGFR: 81 mL/min/{1.73_m2} — ABNORMAL LOW (ref >90)
Glucose: 169 mg/dl — ABNORMAL HIGH (ref 70–140)
Potassium: 4.5 mEq/L (ref 3.5–5.1)
Sodium: 139 mEq/L (ref 136–145)
Total Bilirubin: 0.67 mg/dL (ref 0.20–1.20)
Total Protein: 6.5 g/dL (ref 6.4–8.3)

## 2014-05-09 NOTE — Progress Notes (Signed)
  [place] OFFICE PROGRESS NOTE   Diagnosis:  Rectal cancer Oncology History   Rectal cancer  Staging form: Colon and Rectum, AJCC 7th Edition  Clinical: Stage IIA (T3, N0, M0) - Signed by Truitt Merle, MD on 03/31/2014       Rectal cancer   03/11/2014 Initial Diagnosis Rectal cancer, cT3N0   03/11/2014 Pathology Results Rectal mass biopsy showed invasive adenocarcinoma. Ascending colon polyp biopsy showed tubulovillous adenoma.   03/11/2014 Procedure Colonoscopy showed 3 sessiile polyp (2 in ascending, 1 in descending colon), not removed, and a rectal mass measuring 4 x 3 cm. EUS showed T3N0 disease    03/18/2014 Imaging CT CAP showed lower rectal wall thickening, no metastatic disease or adenopathy.             INTERVAL HISTORY:   [name] returns as scheduled. She continues radiation and Xeloda. She denies nausea/vomiting. No mouth sores. She continues to have loose stools ranging from 3-5 per day. She takes Imodium as needed with good results. She denies any rectal pain. No bleeding. No hand or foot pain or redness. She reports good control of blood sugars.  Objective:  Vital signs in last 24 hours:  Blood pressure 134/56, pulse 81, temperature 98.4 F (36.9 C), temperature source Oral, resp. rate 18, height 5' (1.524 m), weight 159 lb 12.8 oz (72.485 kg), SpO2 96 %.    HEENT: No thrush or ulcers. Resp: Lungs clear bilaterally. Cardio: Regular rate and rhythm. GI: Abdomen soft and nontender. No hepatomegaly. Vascular: No leg edema. Calves soft and nontender.  Skin: Palms with mild erythema. Soles without erythema.    Lab Results:  Lab Results  Component Value Date   WBC 3.8* 05/09/2014   HGB 11.8 05/09/2014   HCT 33.1* 05/09/2014   MCV 93.8 05/09/2014   PLT 82* 05/09/2014   NEUTROABS 2.8 05/09/2014    Imaging:  No results found.  Medications: I have reviewed the patient's current  medications.  Assessment/Plan: 1. Rectal adenocarcinoma, cT3N0M0, stage IIA. Initiation of concurrent radiation/Xeloda 04/11/2014. 2. Mild thrombocytopenia. Intermittent for the past 2 years since her CABG. 3. Hypertension, diabetes, CAD.   Disposition: [name] appears stable. She continues radiation/Xeloda. The platelet count is better. She will return for labs and a follow-up visit in one week. She will contact the office in the interim with any problems.    Ned Card ANP/GNP-BC   05/09/2014  12:56 PM

## 2014-05-10 ENCOUNTER — Ambulatory Visit
Admission: RE | Admit: 2014-05-10 | Discharge: 2014-05-10 | Disposition: A | Discharge status: Home / Self Care | Payer: Commercial Managed Care - HMO | Source: Ambulatory Visit | Attending: Radiation Oncology | Admitting: Radiation Oncology

## 2014-05-10 DIAGNOSIS — C20 Malignant neoplasm of rectum: Secondary | ICD-10-CM | POA: Diagnosis not present

## 2014-05-11 ENCOUNTER — Ambulatory Visit
Admission: RE | Admit: 2014-05-11 | Discharge: 2014-05-11 | Disposition: A | Discharge status: Home / Self Care | Payer: Commercial Managed Care - HMO | Source: Ambulatory Visit | Attending: Radiation Oncology | Admitting: Radiation Oncology

## 2014-05-11 ENCOUNTER — Encounter: Payer: Self-pay | Admitting: Radiation Oncology

## 2014-05-11 VITALS — BP 109/69 | HR 87 | Temp 98.0°F | Resp 20 | Wt 157.3 lb

## 2014-05-11 DIAGNOSIS — C20 Malignant neoplasm of rectum: Secondary | ICD-10-CM

## 2014-05-11 NOTE — Progress Notes (Addendum)
Weekly rad  txs pelvis, 23/28 completed, loose stools, takes imodium prn and that helps, no pain or nausea or gas, appetite good, energy level good, worked in garden today for exercise 1 hour, Xeloda Bid with meals 3:09 PM

## 2014-05-11 NOTE — Progress Notes (Signed)
   Department of Radiation Oncology  Phone:  [phone] Fax:        [phone]  Weekly Treatment Note    Name: [name] Date: 05/11/2014 MRN: [id number] DOB: 1950/04/27   Current dose: 41.4 Gy  Current fraction:23   MEDICATIONS: Current Outpatient Prescriptions  Medication Sig Dispense Refill  . aspirin 81 MG tablet Take 81 mg by mouth at bedtime.     . capecitabine (XELODA) 500 MG tablet Take 3 tablets (1,500 mg total) by mouth 2 (two) times daily after a meal. 90 tablet 1  . [place] Calcium 1000 (390 CA) MG TABS Take 1,000 mg by mouth 2 (two) times daily.    . fish oil-omega-3 fatty acids 1000 MG capsule Take 2 g by mouth 2 (two) times daily.     . furosemide (LASIX) 40 MG tablet Take 40 mg by mouth daily as needed for fluid or edema.    . insulin aspart (NOVOLOG) 100 UNIT/ML injection Inject 10 units before each meal.    . insulin detemir (LEVEMIR) 100 UNIT/ML injection Inject 96 Units into the skin at bedtime.     . metFORMIN (GLUCOPHAGE) 1000 MG tablet TAKE ONE TABLET BY MOUTH TWICE DAILY WITH  A  MEAL 60 tablet 5  . metoprolol tartrate (LOPRESSOR) 25 MG tablet Take 25 mg by mouth 2 (two) times daily.    . prochlorperazine (COMPAZINE) 10 MG tablet Take 1 tablet (10 mg total) by mouth every 6 (six) hours as needed for nausea or vomiting. 30 tablet 0  . sertraline (ZOLOFT) 50 MG tablet Take 1 tablet (50 mg total) by mouth at bedtime. 30 tablet 3   No current facility-administered medications for this encounter.     ALLERGIES: Review of patient's allergies indicates no known allergies.   LABORATORY DATA:  Lab Results  Component Value Date   WBC 3.8* 05/09/2014   HGB 11.8 05/09/2014   HCT 33.1* 05/09/2014   MCV 93.8 05/09/2014   PLT 82* 05/09/2014   Lab Results  Component Value Date   NA 139 05/09/2014   K 4.5 05/09/2014   CL 99 02/27/2013   CO2 24 05/09/2014   Lab Results  Component Value Date   ALT 29 05/09/2014   AST 21 05/09/2014   ALKPHOS 43  05/09/2014   BILITOT 0.67 05/09/2014     NARRATIVE: [name] was seen today for weekly treatment management. The chart was checked and the patient's films were reviewed.  Weekly rad  txs pelvis, 23/28 completed, loose stools, takes imodium prn and that helps, no pain or nausea or gas, appetite good, energy level good, worked in garden today for exercise 1 hour, Xeloda Bid with meals 5:04 PM   PHYSICAL EXAMINATION: weight is 157 lb 4.8 oz (71.351 kg). Her oral temperature is 98 F (36.7 C). Her blood pressure is 109/69 and her pulse is 87. Her respiration is 20.        ASSESSMENT: The patient is doing satisfactorily with treatment.  PLAN: We will continue with the patient's radiation treatment as planned.

## 2014-05-12 ENCOUNTER — Ambulatory Visit
Admission: RE | Admit: 2014-05-12 | Discharge: 2014-05-12 | Disposition: A | Discharge status: Home / Self Care | Payer: Commercial Managed Care - HMO | Source: Ambulatory Visit | Attending: Radiation Oncology | Admitting: Radiation Oncology

## 2014-05-12 DIAGNOSIS — C20 Malignant neoplasm of rectum: Secondary | ICD-10-CM | POA: Diagnosis not present

## 2014-05-13 ENCOUNTER — Telehealth: Payer: Self-pay | Admitting: *Deleted

## 2014-05-13 ENCOUNTER — Ambulatory Visit
Admission: RE | Admit: 2014-05-13 | Discharge: 2014-05-13 | Disposition: A | Discharge status: Home / Self Care | Payer: Commercial Managed Care - HMO | Source: Ambulatory Visit | Attending: Radiation Oncology | Admitting: Radiation Oncology

## 2014-05-13 DIAGNOSIS — C20 Malignant neoplasm of rectum: Secondary | ICD-10-CM | POA: Diagnosis not present

## 2014-05-13 MED ORDER — CAPECITABINE 500 MG PO TABS: 825.0000 mg/m2 | ORAL_TABLET | Freq: Two times a day (BID) | ORAL | Status: DC

## 2014-05-13 NOTE — Addendum Note (Signed)
Addended by: Truitt Merle on: 05/13/2014 05:59 PM   Modules accepted: Orders

## 2014-05-13 NOTE — Telephone Encounter (Signed)
I will refilled her xeloda. Please let pt know on Monday.   Truitt Merle  05/13/2014

## 2014-05-13 NOTE — Telephone Encounter (Signed)
TC from patient. She states she does not have enough xeloda to complete treatment. She states she needs 12 more tablets for next Tuesday and Wednesday, 4/12 and 4/13. She has enough for Monday, 05/16/14. Pt. uses [facility]

## 2014-05-16 ENCOUNTER — Other Ambulatory Visit: Payer: Self-pay | Admitting: Hematology

## 2014-05-16 ENCOUNTER — Ambulatory Visit (HOSPITAL_BASED_OUTPATIENT_CLINIC_OR_DEPARTMENT_OTHER): Payer: Commercial Managed Care - HMO | Admitting: Hematology

## 2014-05-16 ENCOUNTER — Inpatient Hospital Stay: Admission: RE | Admit: 2014-05-16 | Payer: Self-pay | Source: Ambulatory Visit | Admitting: Radiation Oncology

## 2014-05-16 ENCOUNTER — Encounter: Payer: Self-pay | Admitting: Hematology

## 2014-05-16 ENCOUNTER — Ambulatory Visit
Admission: RE | Admit: 2014-05-16 | Discharge: 2014-05-16 | Disposition: A | Discharge status: Home / Self Care | Payer: Commercial Managed Care - HMO | Source: Ambulatory Visit | Attending: Radiation Oncology | Admitting: Radiation Oncology

## 2014-05-16 ENCOUNTER — Telehealth: Payer: Self-pay | Admitting: Hematology

## 2014-05-16 ENCOUNTER — Other Ambulatory Visit (HOSPITAL_BASED_OUTPATIENT_CLINIC_OR_DEPARTMENT_OTHER): Payer: Commercial Managed Care - HMO

## 2014-05-16 ENCOUNTER — Other Ambulatory Visit: Payer: Self-pay | Admitting: *Deleted

## 2014-05-16 VITALS — BP 130/56 | HR 79 | Temp 98.4°F | Resp 18 | Ht 60.0 in | Wt 158.2 lb

## 2014-05-16 DIAGNOSIS — E119 Type 2 diabetes mellitus without complications: Secondary | ICD-10-CM

## 2014-05-16 DIAGNOSIS — D696 Thrombocytopenia, unspecified: Secondary | ICD-10-CM

## 2014-05-16 DIAGNOSIS — I10 Essential (primary) hypertension: Secondary | ICD-10-CM | POA: Diagnosis not present

## 2014-05-16 DIAGNOSIS — C20 Malignant neoplasm of rectum: Secondary | ICD-10-CM | POA: Diagnosis not present

## 2014-05-16 LAB — CBC & DIFF AND RETIC
BASO%: 0.2 % (ref 0.0–2.0)
BASOS ABS: 0 10*3/uL (ref 0.0–0.1)
EOS ABS: 0.1 10*3/uL (ref 0.0–0.5)
EOS%: 2.3 % (ref 0.0–7.0)
HCT: 32.9 % — ABNORMAL LOW (ref 34.8–46.6)
HEMOGLOBIN: 11.9 g/dL (ref 11.6–15.9)
Immature Retic Fract: 20.4 % — ABNORMAL HIGH (ref 1.60–10.00)
LYMPH#: 0.4 10*3/uL — AB (ref 0.9–3.3)
LYMPH%: 8.6 % — ABNORMAL LOW (ref 14.0–49.7)
MCH: 34.2 pg — AB (ref 25.1–34.0)
MCHC: 36.2 g/dL — ABNORMAL HIGH (ref 31.5–36.0)
MCV: 94.5 fL (ref 79.5–101.0)
MONO#: 0.5 10*3/uL (ref 0.1–0.9)
MONO%: 12.1 % (ref 0.0–14.0)
NEUT%: 76.8 % (ref 38.4–76.8)
NEUTROS ABS: 3.3 10*3/uL (ref 1.5–6.5)
Platelets: 80 10*3/uL — ABNORMAL LOW (ref 145–400)
RBC: 3.48 10*6/uL — ABNORMAL LOW (ref 3.70–5.45)
RDW: 19.6 % — AB (ref 11.2–14.5)
RETIC CT ABS: 167.04 10*3/uL — AB (ref 33.70–90.70)
Retic %: 4.8 % — ABNORMAL HIGH (ref 0.70–2.10)
WBC: 4.3 10*3/uL (ref 3.9–10.3)

## 2014-05-16 LAB — COMPREHENSIVE METABOLIC PANEL (CC13)
ALT: 33 U/L (ref 0–55)
ANION GAP: 13 meq/L — AB (ref 3–11)
AST: 21 U/L (ref 5–34)
Albumin: 3.7 g/dL (ref 3.5–5.0)
Alkaline Phosphatase: 41 U/L (ref 40–150)
BILIRUBIN TOTAL: 0.67 mg/dL (ref 0.20–1.20)
BUN: 20.7 mg/dL (ref 7.0–26.0)
CHLORIDE: 103 meq/L (ref 98–109)
CO2: 26 mEq/L (ref 22–29)
Calcium: 9.3 mg/dL (ref 8.4–10.4)
Creatinine: 0.7 mg/dL (ref 0.6–1.1)
EGFR: >90 mL/min/{1.73_m2} (ref >90)
Glucose: 175 mg/dl — ABNORMAL HIGH (ref 70–140)
Potassium: 4.5 mEq/L (ref 3.5–5.1)
SODIUM: 141 meq/L (ref 136–145)
TOTAL PROTEIN: 6.4 g/dL (ref 6.4–8.3)

## 2014-05-16 MED ORDER — CAPECITABINE 500 MG PO TABS: 825.0000 mg/m2 | ORAL_TABLET | Freq: Two times a day (BID) | ORAL | Status: DC

## 2014-05-16 NOTE — Progress Notes (Signed)
Patient came to nursing to discuss incontinence of her bladder , stated started about 1.5 weeks ago, she was reminded of ways to manage side effects from her radiation therapy and you book, also gave instructions on kegal exercises  , asked if she wore a pad or depends"Yes, I have too", has urgency and frequency, which are side effects of radiation to the pelvis area, will recheck patient Wednesday after Rad tx to see if the kegal exercises help any, but may not till after rad txs completed, 1-2 months, will See MD last day of treatment and will give a 1 month follow up card as well, patient gave verbal understanding of kegal exercises 3:17 PM  3:17 PM

## 2014-05-16 NOTE — Progress Notes (Signed)
[place]  [phone] Fax:(336) [phone]  Clinic New Consult Note   Patient Care Team: Eulas Post, MD as PCP - General Jacolyn Reedy, MD as Consulting Physician (Cardiology) Ladene Artist, MD as Consulting Physician (Gastroenterology) [name], MD as Consulting Physician (General Surgery) Kyung Rudd, MD as Consulting Physician (Radiation Oncology) 05/16/2014  CHIEF COMPLAINTS:  Follow up rectal cancer    Oncology History   Rectal cancer   Staging form: Colon and Rectum, AJCC 7th Edition     Clinical: Stage IIA (T3, N0, M0) - Signed by Truitt Merle, MD on 03/31/2014       Rectal cancer   03/11/2014 Initial Diagnosis Rectal cancer, cT3N0   03/11/2014 Pathology Results Rectal mass biopsy showed invasive adenocarcinoma. Ascending colon polyp biopsy showed tubulovillous adenoma.   03/11/2014 Procedure Colonoscopy showed 3 sessiile polyp (2 in ascending, 1 in descending colon), not removed,  and a rectal mass measuring 4 x 3 cm. EUS showed T3N0 disease    03/18/2014 Imaging CT CAP showed lower rectal wall thickening, no metastatic disease or adenopathy.     HISTORY OF PRESENTING ILLNESS:  [name] 64 y.o. female is here because of newly diagnosed rectal cancer.  She has had rectal bleeding for 3 month, blood on surface of stool, 1/2-1 tea spoon each time, intermittent, no rectal pain or other complains. She feels well overall. She denies nausea, abdominal discomfort, change of her appetite or weight loss. She was evaluated by her PCP and was referred to Dr. Fuller Plan. Her appointment was rescheduled three times due to multiple reasons. She finally had colonoscopy on 03/07/2014, which showed a half circumferential firm mass at the rectum, measuring 4 x 3 cm,, it extended from 7-11 cm in the mid rectum. Additional 2 sessile polyps in the ascending colon and 1 sessile polyp in the sigmoid colon were found and biopsied. The rectal mass biopsy showed  adenocarcinoma. EUS was done by Dr. Ardis Hughs showed a T3 lesion, no regional adenopathy. She was seen by colorectal surgeon Dr. Marcello Moores, and was referred to radiation oncologist Dr. Lisbeth Renshaw and the me to discuss neoadjuvant chemotherapy radiation.  She feels well overall, has good appetite and eats well, she has mild fatigue since her CABG 2 years ago.  No chest pain or dyspnea. No significant weight loss recently.   INTERIM HISTORY She came in today for follow-up. She is on her last week of concurrent chemoradiation. She has been tolerating treatment well until last Friday, when she developed nausea, diarrhea and some incontinence. She denies any abdominal pain, blood in stool, fever or chills.  She is finishing treatment this Wednesday (in two days).    MEDICAL HISTORY:  Past Medical History  Diagnosis Date  . Diabetes mellitus, insulin dependent (IDDM), uncontrolled   . Hyperlipidemia   . Hypertensive heart disease   .  Paroxysmal SVT (ANVRT)     S/p AV nodal ablation Dr. Lovena Le 09/28/08   . Depression   . CAD     Coronary disease status post non-ST elevation MI in May 2010 with  PCI of the left circumflex on Jun 24, 2008. She then had a PCI of  the RCA on July 15, 2008. 02/24/12 Cath, Severe 95% LAD, ostial 95% circ, ostial RCA, patent stents in distal RCA and mid circ normal LV 02/26/12 CABG with LIMA to LAD, SVG to RCA, and SVG to OM Dr. Roxan Hockey    . Cataract   . Cancer     adenocarcinoma  of rectum    SURGICAL HISTORY: Past Surgical History  Procedure Laterality Date  . Coronary angioplasty with stent placement    . Coronary artery bypass graft  02/26/2012    Roxan Hockey, MD;  Location: [facility];  Service: Open Heart Surgery;  Laterality: N/A;  CABG x three,  using left internal mammary artery  and left leg greater saphenous vein,   . Endovein harvest of greater saphenous vein  02/26/2012  . Left heart catheterization with coronary angiogram N/A 02/24/2012    Procedure: LEFT HEART  CATHETERIZATION WITH CORONARY ANGIOGRAM;  Surgeon: Jacolyn Reedy, MD;  Location: [facility] CATH [facility];  Service: Cardiovascular;  Laterality: N/A;  . Eus N/A 03/24/2014    Procedure: LOWER ENDOSCOPIC ULTRASOUND (EUS);  Surgeon: Milus Banister, MD;  Location: [facility];  Service: Endoscopy;  Laterality: N/A;  . Colonoscopy      SOCIAL HISTORY: History   Social History  . Marital Status: Divorced    Spouse Name: N/A  . Number of Children: One son at age 68  . Years of Education: N/A   Occupational History  . Not on file.   Social History Main Topics  . Smoking status: Never Smoker   . Smokeless tobacco: Never Used  . Alcohol Use: No  . Drug Use: No  . Sexual Activity: No   Other Topics Concern  .  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 Colon cancer Neg Hx   . Diabetes Father   . Diabetes Brother   . Diabetes Sister   . Colon polyps Maternal Grandmother     ALLERGIES:  has No Known Allergies.  MEDICATIONS:  Current Outpatient Prescriptions  Medication Sig Dispense Refill  . aspirin 81 MG tablet Take 81 mg by mouth at bedtime.     . capecitabine (XELODA) 500 MG tablet Take 3 tablets (1,500 mg total) by mouth 2 (two) times daily after a meal. 90 tablet 1  . Coral Calcium 1000 (390 CA) MG TABS Take 1,000 mg by mouth 2 (two) times daily.    . fish oil-omega-3 fatty acids 1000 MG capsule Take 2 g by mouth 2 (two) times daily.     . furosemide (LASIX) 40 MG tablet Take 40 mg by mouth daily as needed for fluid or edema.    . insulin aspart (NOVOLOG) 100 UNIT/ML injection Inject 10 units before each meal.    . insulin detemir (LEVEMIR) 100 UNIT/ML injection Inject 96 Units into the skin at bedtime.     . metFORMIN (GLUCOPHAGE) 1000 MG tablet TAKE ONE TABLET BY MOUTH TWICE DAILY WITH  A  MEAL 60 tablet 5  . metoprolol tartrate (LOPRESSOR) 25 MG tablet Take 25 mg by mouth 2 (two) times daily.    .  prochlorperazine (COMPAZINE) 10 MG tablet Take 1 tablet (10 mg total) by mouth every 6 (six) hours as needed for nausea or vomiting. 30 tablet 0  . sertraline (ZOLOFT) 50 MG tablet Take 1 tablet (50 mg total) by mouth at bedtime. 30 tablet 3   No current facility-administered medications for this visit.    REVIEW OF SYSTEMS:   Constitutional: Denies fevers, chills or abnormal night sweats Eyes: Denies blurriness of vision, double vision or watery eyes Ears, nose, mouth, throat, and face: Denies mucositis or sore throat Respiratory: Denies cough, dyspnea or wheezes Cardiovascular: Denies palpitation, chest discomfort or lower extremity swelling Gastrointestinal:  Denies nausea, heartburn  or change in bowel habits Skin: Denies abnormal skin rashes Lymphatics: Denies new lymphadenopathy or easy bruising Neurological:Denies numbness, tingling or new weaknesses Behavioral/Psych: Mood is stable, no new changes  All other systems were reviewed with the patient and are negative.  PHYSICAL EXAMINATION: ECOG PERFORMANCE STATUS: 0 - Asymptomatic  Filed Vitals:   05/16/14 1329  BP: 130/56  Pulse: 79  Temp: 98.4 F (36.9 C)  Resp: 18   Filed Weights   05/16/14 1329  Weight: 158 lb 3.2 oz (71.759 kg)    GENERAL:alert, no distress and comfortable SKIN: skin color, texture, turgor are normal, no rashes or significant lesions EYES: normal, conjunctiva are pink and non-injected, sclera clear OROPHARYNX:no exudate, no erythema and lips, buccal mucosa, and tongue normal  NECK: supple, thyroid normal size, non-tender, without nodularity LYMPH:  no palpable lymphadenopathy in the cervical, axillary or inguinal LUNGS: clear to auscultation and percussion with normal breathing effort HEART: regular rate & rhythm and no murmurs and no lower extremity edema ABDOMEN:abdomen soft, non-tender and normal bowel sounds Musculoskeletal:no cyanosis of digits and no clubbing  PSYCH: alert & oriented x 3  with fluent speech NEURO: no focal motor/sensory deficits  LABORATORY DATA:  I have reviewed the data as listed Lab Results  Component Value Date   WBC 4.3 05/16/2014   HGB 11.9 05/16/2014   HCT 32.9* 05/16/2014   MCV 94.5 05/16/2014   PLT 80* 05/16/2014    Recent Labs  05/02/14 1317 05/09/14 1206 05/16/14 1307  NA 139 139 141  K 4.6 4.5 4.5  CO2 22 24 26   GLUCOSE 199* 169* 175*  BUN 16.5 17.6 20.7  CREATININE 0.8 0.8 0.7  CALCIUM 9.5 9.4 9.3  PROT 6.1* 6.5 6.4  ALBUMIN 3.5 3.7 3.7  AST 18 21 21   ALT 29 29 33  ALKPHOS 36* 43 41  BILITOT 0.51 0.67 0.67   PATHOLOGY REPORT: FINAL DIAGNOSIS Diagnosis 03/11/2014 1. Surgical [P], ascending, biopsy MULTIPLE FRAGMENTS OF TUBULOVILLOUS ADENOMA. PLEASE SEE COMMENT. 2. Surgical [P], sigmoid, polyp HYPERPLASTIC POLYP(S). NO ADENOMATOUS CHANGE OR MALIGNANCY IDENTIFIED. 3. Surgical [P], rectum, biopsy INVASIVE ADENOCARCINOMA. PLEASE SEE COMMENT. Microscopic Comment 1. There is adenomatous epithelium having both tubular and villous growth patterns consistent with a tubulovillous adenoma if the biopsy is representative of the entire lesion. No high grade dysplasia or evidence of malignancy is identified. Clinical correlation is recommended. It is not clear whether this represents the entire lesion noted clinically or whether this is a portion of a larger lesion in which case the changes here may or may not be representative. 3. There are malignant glandular cells arranged in acinar and cribriform patterns with associated desmoplastic stromal reaction. The findings are diagnostic for invasive adenocarcinoma. Case was discussed with Dr. Fuller Plan on 03-15-2014.  RADIOGRAPHIC STUDIES: I have personally reviewed the radiological images as listed and agreed with the findings in the report.  Ct Abdomen Pelvis W Contrast 03/18/2014    IMPRESSION: Lower rectal wall thickening, likely corresponding to known rectal adenocarcinoma.  No findings  suspicious for metastatic disease in the chest, abdomen, or pelvis.   Electronically Signed   By: Julian Hy M.D.   On: 03/18/2014 16:06   Colonoscopy 03/11/2014: COLON FINDINGS: A sessile polyp measuring 18 mm in size was found in the ascending colon. It was not removed due to the adjacent large polyp whcih will require surgical resection. A sessile polyp measuring 40 mm in size was found in the ascending colon. Multiple biopsies were performed. Injection (tattooing) was  performed. It was not amenable to endoscopic removal. A sessile polyp measuring 6 mm in size was found in the sigmoid colon. A polypectomy was performed with a cold snare. The resection was complete, the polyp tissue was completely retrieved and sent to histology. A half circumferential firm mass, measuring 4 X 3cm in size, was found in the rectum. It extended from 7-11 cm in the mid rectum. Multiple biopsies of the lesion were performed. The examination was otherwise normal. Retroflexed views revealed no abnormalities. The time to cecum=4 minutes 28 seconds. Withdrawal time=19 minutes 30 seconds. The scope was withdrawn and the procedure completed. COMPLICATIONS: There were no immediate complications. ENDOSCOPIC IMPRESSION: 1. Sessile polyp in the ascending colon-not removed 2. Sessile polyp in the ascending colon; not removed; multiple biopsies performed; Injection (tattooing) performed 3. Sessile polyp in the sigmoid colon; polypectomy performed with a cold snare 4. Rectal mass, measuring 4 X 3 cm; multiple biopsies of the lesion performed  EUS findings: 1. The mass above corresponds with a hypoechoic lesion that measures 1.5cm in depth, clearly passes into and through the muscularis propria layer of the rectal wall (uT3). 2. No perirectal adenopathy (uN0). ENDOSCOPIC IMPRESSION: 4cm long, non-circumferential, uT3N0 (Stage IIa) rectal adenocarcinoma that lays along the right anterior wall of the rectum wtih distal  edge 8cm from the anal verge.  ASSESSMENT & PLAN:  64 year old Caucasian female, with past medical history of hypertension, diabetes, multivessel coronary artery disease, status post 2 stents placement and triple bypass 2 years ago. She presented with rectal bleeding.  1. Rectal adenocarcinoma, cT3N0M0, stage IIA  -I reviewed her imaging, endoscopical, and biopsy findings with her in great details. She has a clinica stage II a mid rectal adenocarcinoma, and the goal of care is curative.  -for T3/T4 rectal cancer, the standard care is neoadjuvant concurrent chemoradiation followed by surgery, and possible adjuvant chemotherapy. -giving her her history of coronary artery disease, I'm little concerned about vascular spasm from Capecitabine. I have spoken with cardiologist Dr. Wynonia Lawman who felt she does not need a stress test before chemo, because her CABG was just 2 years ago and she is asymptomatic.  -She is on last week radiation along with capecitabine 1500mg  q12hr (M-F) today, doing well, will refill 2 days of capecitabine -RTC in 2 weeks for check up  -I'll plan to see her after surgery, and decide if she needs adjuvant chemotherapy.  2. Mild thrombocytopenia -Intermittent for the past few years since her CABG -We'll follow CBC closely when she is on chemo   2. Hypertension, diabetes, coronary artery disease -She will continue follow-up with her primary care physician and cardiologist -We discussed the risk of coronary artery spasm with chemotherapy.  3. Diarrhea, nausea - likely related to radiation and chemotherapy -She knows to take Imodium and Compazine as needed or symptoms.   Plan -Lab and a follow-up with APP in 2 weeks, to check toxicity from chemoradiation  -She will call me after her surgery and I'll see her 3 weeks after surgery.  -We'll discuss with Dr. Marcello Moores about her restaging scan before surgery.   All questions were answered. The patient knows to call the clinic with  any problems, questions or concerns. I spent 15 minutes counseling the patient face to face. The total time spent in the appointment was 20 minutes and more than 50% was on counseling.     Truitt Merle, MD 05/16/2014 2:09 PM

## 2014-05-16 NOTE — Telephone Encounter (Signed)
gave and rpinted appt sched and avs for pt for April

## 2014-05-17 ENCOUNTER — Ambulatory Visit
Admission: RE | Admit: 2014-05-17 | Discharge: 2014-05-17 | Disposition: A | Discharge status: Home / Self Care | Payer: Commercial Managed Care - HMO | Source: Ambulatory Visit | Attending: Radiation Oncology | Admitting: Radiation Oncology

## 2014-05-17 DIAGNOSIS — E11349 Type 2 diabetes mellitus with severe nonproliferative diabetic retinopathy without macular edema: Secondary | ICD-10-CM | POA: Diagnosis not present

## 2014-05-17 DIAGNOSIS — C20 Malignant neoplasm of rectum: Secondary | ICD-10-CM | POA: Diagnosis not present

## 2014-05-17 DIAGNOSIS — H43813 Vitreous degeneration, bilateral: Secondary | ICD-10-CM | POA: Diagnosis not present

## 2014-05-17 DIAGNOSIS — E11341 Type 2 diabetes mellitus with severe nonproliferative diabetic retinopathy with macular edema: Secondary | ICD-10-CM | POA: Diagnosis not present

## 2014-05-18 ENCOUNTER — Ambulatory Visit
Admission: RE | Admit: 2014-05-18 | Discharge: 2014-05-18 | Disposition: A | Discharge status: Home / Self Care | Payer: Commercial Managed Care - HMO | Source: Ambulatory Visit | Attending: Radiation Oncology | Admitting: Radiation Oncology

## 2014-05-18 ENCOUNTER — Encounter: Payer: Self-pay | Admitting: Radiation Oncology

## 2014-05-18 VITALS — BP 130/67 | HR 86 | Temp 98.5°F | Resp 20 | Wt 158.6 lb

## 2014-05-18 DIAGNOSIS — C20 Malignant neoplasm of rectum: Secondary | ICD-10-CM

## 2014-05-18 NOTE — Progress Notes (Signed)
Follow up s/p SRS

## 2014-05-18 NOTE — Progress Notes (Signed)
   Department of Radiation Oncology  Phone:  [phone] Fax:        [phone]  Weekly Treatment Note    Name: [name] Date: 05/18/2014 MRN: [id number] DOB: 08-22-1950   Current dose: 50.4 Gy  Current fraction: 28   MEDICATIONS: Current Outpatient Prescriptions  Medication Sig Dispense Refill  . aspirin 81 MG tablet Take 81 mg by mouth at bedtime.     . capecitabine (XELODA) 500 MG tablet Take 3 tablets (1,500 mg total) by mouth 2 (two) times daily after a meal. 12 tablet 0  . Coral Calcium 1000 (390 CA) MG TABS Take 1,000 mg by mouth 2 (two) times daily.    . fish oil-omega-3 fatty acids 1000 MG capsule Take 2 g by mouth 2 (two) times daily.     . furosemide (LASIX) 40 MG tablet Take 40 mg by mouth daily as needed for fluid or edema.    . insulin aspart (NOVOLOG) 100 UNIT/ML injection Inject 10 units before each meal.    . insulin detemir (LEVEMIR) 100 UNIT/ML injection Inject 96 Units into the skin at bedtime.     . metFORMIN (GLUCOPHAGE) 1000 MG tablet TAKE ONE TABLET BY MOUTH TWICE DAILY WITH  A  MEAL 60 tablet 5  . metoprolol tartrate (LOPRESSOR) 25 MG tablet Take 25 mg by mouth 2 (two) times daily.    . prochlorperazine (COMPAZINE) 10 MG tablet Take 1 tablet (10 mg total) by mouth every 6 (six) hours as needed for nausea or vomiting. 30 tablet 0  . sertraline (ZOLOFT) 50 MG tablet Take 1 tablet (50 mg total) by mouth at bedtime. 30 tablet 3   No current facility-administered medications for this encounter.     ALLERGIES: Review of patient's allergies indicates no known allergies.   LABORATORY DATA:  Lab Results  Component Value Date   WBC 4.3 05/16/2014   HGB 11.9 05/16/2014   HCT 32.9* 05/16/2014   MCV 94.5 05/16/2014   PLT 80* 05/16/2014   Lab Results  Component Value Date   NA 141 05/16/2014   K 4.5 05/16/2014   CL 99 02/27/2013   CO2 26 05/16/2014   Lab Results  Component Value Date   ALT 33 05/16/2014   AST 21 05/16/2014   ALKPHOS 41  05/16/2014   BILITOT 0.67 05/16/2014     NARRATIVE: [name] was seen today for weekly treatment management. The chart was checked and the patient's films were reviewed.  Patient completed treatment today. She denies pain, loss of appetite. She takes Imodium 4 tabs daily to control diarrhea. She states she developed urinary incontinence a week ago, denies dysuria.  She is fatigued at times. She denies skin irritation in her treatment area. She has FU card. She denies any difficulties with irritation in the anal region currently. BP 130/67 mmHg  Pulse 86  Temp(Src) 98.5 F (36.9 C) (Oral)  Resp 20  Wt 158 lb 9.6 oz (71.94 kg)  PHYSICAL EXAMINATION: weight is 158 lb 9.6 oz (71.94 kg). Her oral temperature is 98.5 F (36.9 C). Her blood pressure is 130/67 and her pulse is 86. Her respiration is 20.        ASSESSMENT: The patient is doing satisfactorily with treatment. She finished her final fraction today.  PLAN: Follow-up in one month.

## 2014-05-18 NOTE — Progress Notes (Addendum)
Patient completed treatment today. She denies pain, loss of appetite. She takes Imodium 4 tabs daily to control diarrhea. She states she developed urinary incontinence a week ago, denies dysuria.  She is fatigued at times. She denies skin irritation in her treatment area. She has FU card. BP 130/67 mmHg  Pulse 86  Temp(Src) 98.5 F (36.9 C) (Oral)  Resp 20  Wt 158 lb 9.6 oz (71.94 kg)

## 2014-05-23 ENCOUNTER — Other Ambulatory Visit: Payer: Commercial Managed Care - HMO

## 2014-05-23 ENCOUNTER — Ambulatory Visit: Payer: Commercial Managed Care - HMO | Admitting: Hematology

## 2014-05-27 ENCOUNTER — Ambulatory Visit
Admission: RE | Admit: 2014-05-27 | Discharge: 2014-05-27 | Disposition: A | Discharge status: Home / Self Care | Payer: Commercial Managed Care - HMO | Source: Ambulatory Visit | Attending: Radiation Oncology | Admitting: Radiation Oncology

## 2014-05-27 ENCOUNTER — Telehealth: Payer: Self-pay | Admitting: *Deleted

## 2014-05-27 VITALS — BP 150/62 | HR 93 | Temp 98.3°F | Wt 158.8 lb

## 2014-05-27 DIAGNOSIS — C20 Malignant neoplasm of rectum: Secondary | ICD-10-CM | POA: Diagnosis not present

## 2014-05-27 DIAGNOSIS — C19 Malignant neoplasm of rectosigmoid junction: Secondary | ICD-10-CM

## 2014-05-27 NOTE — Telephone Encounter (Addendum)
[name] called and reported that by the end of treatment she has "two red areas on her abdomen/ right hip".  She placed a heating pad on this area and now has redness for which she is greatly concerned.  She reports that skin is intact at this time.  She was seen by her surgeon, who advised her to call and have this area examined by Dr. Lisbeth Renshaw.  She is scheduled for an appointment with Dr. Lisbeth Renshaw.

## 2014-05-27 NOTE — Progress Notes (Signed)
  Radiation Oncology         [phone] ________________________________  Name: [name] MRN: [id number]  Date: 05/09/2014  DOB: 1950/08/08  COMPLEX SIMULATION  NOTE  Diagnosis: rectal cancer  Narrative The patient has initially been planned to receive a course of radiation treatment to a dose of 45 gray in 25 fractions at 1.8 gray per fraction. The patient will now receive a boost to the high risk target volume for an additional 5.4 gray. This will be delivered in 3 fractions at 1.8 gray per fraction and a cone down boost technique will be utilized. To accomplish this, an additional 4 customized blocks have been designed for this purpose. A complex isodose plan is requested to ensure that the high-risk target region receives the appropriate radiation dose and that the nearby normal structures continue to be appropriately spared. The patient's final total dose therefore will be 50.4 gray.   ________________________________ ------------------------------------------------  Jodelle Gross, MD, PhD

## 2014-05-27 NOTE — Progress Notes (Signed)
  Radiation Oncology         [phone] ________________________________  Name: [name] MRN: [id number]  Date: 05/18/2014  DOB: 19-Aug-1950  End of Treatment Note  Diagnosis:   Rectal cancer     Indication for treatment:  curative       Radiation treatment dates:   04/11/2014 through 05/18/2014  Site/dose:    The patient was treated to the pelvis including the gross tumor volume and at risk nodal volumes to a dose of 45 Gy at 1.8 Gy per fraction. This was accomplished using a 4 field 3-D conformal technique. The patient then received a boost to the tumor and adjacent high-risk regions for an additional 5.4 Gy at 1.8 gray per fraction. This was carried out using a coned-down 4 field approach. The patient's total dose was 50.4 Gy.  Narrative: The patient tolerated radiation treatment relatively well.   The patient did not have substantial difficulty with GI toxicity during treatment.  Plan: The patient has completed radiation treatment. The patient will return to radiation oncology clinic for routine followup in one month. I advised the patient to call or return sooner if they have any questions or concerns related to their recovery or treatment. ________________________________  Jodelle Gross, M.D., Ph.D.

## 2014-05-28 NOTE — Progress Notes (Signed)
The patient came into clinic today for a skin check. She states that she applied a heating pad to the right buttock region and developed some irritation more anteriorly. She did see the surgeon who recommended that she come see [place] today.  On exam, the patient has an erythematous, blistered, patchy area in the right inguinal region and extending along the skin fold more laterally. There is been some superficial bleeding. The patient also has a very small area of desquamation laterally within the skin fold extending from the left inguinal region as well. No significant skin change in the buttock region itself.  This is an unusual appearance following radiation treatment. Given the patient's explanation of the timeframe between applying the heating pad and the skin reaction, as suspected this is responsible for the reaction seen in conjunction with the radiation treatment. It is surprising however that the skin reaction is not directly where the patient states that the heating pad was applied. However the skin fold would have experienced increased irritation from radiation and this may account for this finding. We have given the patient some telfa pads and she will placed Neosporin on this area. I urged her to be very gentle with this area and I suspect that this will begin to heal over the next one to 2 weeks. She notes no major change today versus yesterday. I am out of town next week but will see her early the following week for another skin check.  ------------------------------------------------  Jodelle Gross, MD, PhD

## 2014-05-29 NOTE — Progress Notes (Signed)
This encounter was created in error - please disregard.

## 2014-05-30 ENCOUNTER — Other Ambulatory Visit (HOSPITAL_BASED_OUTPATIENT_CLINIC_OR_DEPARTMENT_OTHER): Payer: Commercial Managed Care - HMO

## 2014-05-30 ENCOUNTER — Ambulatory Visit (HOSPITAL_BASED_OUTPATIENT_CLINIC_OR_DEPARTMENT_OTHER): Payer: Commercial Managed Care - HMO | Admitting: Oncology

## 2014-05-30 ENCOUNTER — Telehealth: Payer: Self-pay | Admitting: Oncology

## 2014-05-30 ENCOUNTER — Encounter: Payer: Self-pay | Admitting: Oncology

## 2014-05-30 VITALS — BP 155/65 | HR 78 | Temp 98.2°F | Resp 18 | Ht 60.0 in | Wt 160.5 lb

## 2014-05-30 DIAGNOSIS — E119 Type 2 diabetes mellitus without complications: Secondary | ICD-10-CM

## 2014-05-30 DIAGNOSIS — C20 Malignant neoplasm of rectum: Secondary | ICD-10-CM

## 2014-05-30 DIAGNOSIS — D696 Thrombocytopenia, unspecified: Secondary | ICD-10-CM | POA: Diagnosis not present

## 2014-05-30 LAB — CBC & DIFF AND RETIC
BASO%: 0.2 % (ref 0.0–2.0)
Basophils Absolute: 0 10*3/uL (ref 0.0–0.1)
EOS%: 2.5 % (ref 0.0–7.0)
Eosinophils Absolute: 0.1 10*3/uL (ref 0.0–0.5)
HEMATOCRIT: 30.2 % — AB (ref 34.8–46.6)
HEMOGLOBIN: 10.6 g/dL — AB (ref 11.6–15.9)
Immature Retic Fract: 13.1 % — ABNORMAL HIGH (ref 1.60–10.00)
LYMPH%: 12 % — AB (ref 14.0–49.7)
MCH: 34.9 pg — AB (ref 25.1–34.0)
MCHC: 35.1 g/dL (ref 31.5–36.0)
MCV: 99.3 fL (ref 79.5–101.0)
MONO#: 0.4 10*3/uL (ref 0.1–0.9)
MONO%: 9 % (ref 0.0–14.0)
NEUT#: 3.3 10*3/uL (ref 1.5–6.5)
NEUT%: 76.3 % (ref 38.4–76.8)
PLATELETS: 79 10*3/uL — AB (ref 145–400)
RBC: 3.04 10*6/uL — ABNORMAL LOW (ref 3.70–5.45)
RDW: 19.9 % — ABNORMAL HIGH (ref 11.2–14.5)
Retic %: 4.51 % — ABNORMAL HIGH (ref 0.70–2.10)
Retic Ct Abs: 137.1 10*3/uL — ABNORMAL HIGH (ref 33.70–90.70)
WBC: 4.3 10*3/uL (ref 3.9–10.3)
lymph#: 0.5 10*3/uL — ABNORMAL LOW (ref 0.9–3.3)

## 2014-05-30 LAB — COMPREHENSIVE METABOLIC PANEL (CC13)
ALBUMIN: 3.4 g/dL — AB (ref 3.5–5.0)
ALT: 22 U/L (ref 0–55)
AST: 19 U/L (ref 5–34)
Alkaline Phosphatase: 45 U/L (ref 40–150)
Anion Gap: 14 mEq/L — ABNORMAL HIGH (ref 3–11)
BILIRUBIN TOTAL: 0.43 mg/dL (ref 0.20–1.20)
BUN: 17.4 mg/dL (ref 7.0–26.0)
CHLORIDE: 106 meq/L (ref 98–109)
CO2: 22 mEq/L (ref 22–29)
Calcium: 9.2 mg/dL (ref 8.4–10.4)
Creatinine: 0.7 mg/dL (ref 0.6–1.1)
EGFR: >90 mL/min/{1.73_m2} (ref >90)
Glucose: 98 mg/dl (ref 70–140)
Potassium: 3.8 mEq/L (ref 3.5–5.1)
SODIUM: 142 meq/L (ref 136–145)
TOTAL PROTEIN: 6.1 g/dL — AB (ref 6.4–8.3)

## 2014-05-30 NOTE — Telephone Encounter (Signed)
Pt confirmed labs/ov per 04/25 POF, gave pt AVS and Calendar.... KJ °

## 2014-05-30 NOTE — Progress Notes (Signed)
[facility]  [phone] Fax:([phone]) [phone]  Clinic New Consult Note   Patient Care Team: Eulas Post, MD as PCP - General Jacolyn Reedy, MD as Consulting Physician (Cardiology) Ladene Artist, MD as Consulting Physician (Gastroenterology) [name], MD as Consulting Physician (General Surgery) Kyung Rudd, MD as Consulting Physician (Radiation Oncology) 05/30/2014  CHIEF COMPLAINTS:  Follow up rectal cancer    Oncology History   Rectal cancer   Staging form: Colon and Rectum, AJCC 7th Edition     Clinical: Stage IIA (T3, N0, M0) - Signed by Truitt Merle, MD on 03/31/2014       Rectal cancer   03/11/2014 Initial Diagnosis Rectal cancer, cT3N0   03/11/2014 Pathology Results Rectal mass biopsy showed invasive adenocarcinoma. Ascending colon polyp biopsy showed tubulovillous adenoma.   03/11/2014 Procedure Colonoscopy showed 3 sessiile polyp (2 in ascending, 1 in descending colon), not removed,  and a rectal mass measuring 4 x 3 cm. EUS showed T3N0 disease    03/18/2014 Imaging CT CAP showed lower rectal wall thickening, no metastatic disease or adenopathy.     HISTORY OF PRESENTING ILLNESS:  [name] 64 y.o. female is here because of newly diagnosed rectal cancer.  She has had rectal bleeding for 3 month, blood on surface of stool, 1/2-1 tea spoon each time, intermittent, no rectal pain or other complains. She feels well overall. She denies nausea, abdominal discomfort, change of her appetite or weight loss. She was evaluated by her PCP and was referred to Dr. Fuller Plan. Her appointment was rescheduled three times due to multiple reasons. She finally had colonoscopy on 03/07/2014, which showed a half circumferential firm mass at the rectum, measuring 4 x 3 cm,, it extended from 7-11 cm in the mid rectum. Additional 2 sessile polyps in the ascending colon and 1 sessile polyp in the sigmoid colon were found and biopsied. The rectal mass biopsy showed  adenocarcinoma. EUS was done by Dr. Ardis Hughs showed a T3 lesion, no regional adenopathy. She was seen by colorectal surgeon Dr. Marcello Moores, and was referred to radiation oncologist Dr. Lisbeth Renshaw and the me to discuss neoadjuvant chemotherapy radiation.  She feels well overall, has good appetite and eats well, she has mild fatigue since her CABG 2 years ago.  No chest pain or dyspnea. No significant weight loss recently.   INTERIM HISTORY She came in today for follow-up. She completed her chemoradiation therapy approximate 2 weeks ago. She feels a little bit better today. Her nausea has resolved. Her diarrhea is improving, but she still continues to take Imodium on a regular basis. She is no longer incontinent. She denies any abdominal pain, blood in stool, fever or chills.  She was seen by Dr. Lisbeth Renshaw at the end of last week due to excoriation and blisters near her right groin. These were thought to be due to a heating pad which might been too hot. She is placing Neosporin ointment and Telfa pads to the area. This is now improving.   MEDICAL HISTORY:  Past Medical History  Diagnosis Date  . Diabetes mellitus, insulin dependent (IDDM), uncontrolled   . Hyperlipidemia   . Hypertensive heart disease   .  Paroxysmal SVT (ANVRT)     S/p AV nodal ablation Dr. Lovena Le 09/28/08   . Depression   . CAD     Coronary disease status post non-ST elevation MI in May 2010 with  PCI of the left circumflex on Jun 24, 2008. She then had a PCI of  the RCA on July 15, 2008. 02/24/12 Cath, Severe 95% LAD, ostial 95% circ, ostial RCA, patent stents in distal RCA and mid circ normal LV 02/26/12 CABG with LIMA to LAD, SVG to RCA, and SVG to OM Dr. Roxan Hockey    . Cataract   . Cancer     adenocarcinoma of rectum    SURGICAL HISTORY: Past Surgical History  Procedure Laterality Date  . Coronary angioplasty with stent placement    . Coronary artery bypass graft  02/26/2012    Roxan Hockey, MD;  Location: [facility];  Service: Open Heart  Surgery;  Laterality: N/A;  CABG x three,  using left internal mammary artery  and left leg greater saphenous vein,   . Endovein harvest of greater saphenous vein  02/26/2012  . Left heart catheterization with coronary angiogram N/A 02/24/2012    Procedure: LEFT HEART CATHETERIZATION WITH CORONARY ANGIOGRAM;  Surgeon: Jacolyn Reedy, MD;  Location: [facility] CATH LAB;  Service: Cardiovascular;  Laterality: N/A;  . Eus N/A 03/24/2014    Procedure: LOWER ENDOSCOPIC ULTRASOUND (EUS);  Surgeon: Milus Banister, MD;  Location: [facility];  Service: Endoscopy;  Laterality: N/A;  . Colonoscopy      SOCIAL HISTORY: History   Social History  . Marital Status: Divorced    Spouse Name: N/A  . Number of Children: One son at age 54  . Years of Education: N/A   Occupational History  . Not on file.   Social History Main Topics  . Smoking status: Never Smoker   . Smokeless tobacco: Never Used  . Alcohol Use: No  . Drug Use: No  . Sexual Activity: No   Other Topics Concern  .  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 Colon cancer Neg Hx   . Diabetes Father   . Diabetes Brother   . Diabetes Sister   . Colon polyps Maternal Grandmother     ALLERGIES:  has No Known Allergies.  MEDICATIONS:  Current Outpatient Prescriptions  Medication Sig Dispense Refill  . aspirin 81 MG tablet Take 81 mg by mouth at bedtime.     . capecitabine (XELODA) 500 MG tablet Take 3 tablets (1,500 mg total) by mouth 2 (two) times daily after a meal. 12 tablet 0  . Coral Calcium 1000 (390 CA) MG TABS Take 1,000 mg by mouth 2 (two) times daily.    . fish oil-omega-3 fatty acids 1000 MG capsule Take 2 g by mouth 2 (two) times daily.     . furosemide (LASIX) 40 MG tablet Take 40 mg by mouth daily as needed for fluid or edema.    . insulin aspart (NOVOLOG) 100 UNIT/ML injection Inject 10 units before each meal.    . insulin detemir (LEVEMIR) 100  UNIT/ML injection Inject 96 Units into the skin at bedtime.     . metFORMIN (GLUCOPHAGE) 1000 MG tablet TAKE ONE TABLET BY MOUTH TWICE DAILY WITH  A  MEAL 60 tablet 5  . metoprolol tartrate (LOPRESSOR) 25 MG tablet Take 25 mg by mouth 2 (two) times daily.    . prochlorperazine (COMPAZINE) 10 MG tablet Take 1 tablet (10 mg total) by mouth every 6 (six) hours as needed for nausea or vomiting. 30 tablet 0  . sertraline (ZOLOFT) 50 MG tablet Take 1 tablet (50 mg total) by mouth at bedtime. 30 tablet 3   No current facility-administered medications for this visit.  REVIEW OF SYSTEMS:   Constitutional: Denies fevers, chills or abnormal night sweats Eyes: Denies blurriness of vision, double vision or watery eyes Ears, nose, mouth, throat, and face: Denies mucositis or sore throat Respiratory: Denies cough, dyspnea or wheezes Cardiovascular: Denies palpitation, chest discomfort or lower extremity swelling Gastrointestinal:  Denies nausea, heartburn or change in bowel habits Skin: Reports excoriated area to her right groin. Lymphatics: Denies new lymphadenopathy or easy bruising Neurological:Denies numbness, tingling or new weaknesses Behavioral/Psych: Mood is stable, no new changes  All other systems were reviewed with the patient and are negative.  PHYSICAL EXAMINATION: ECOG PERFORMANCE STATUS: 0 - Asymptomatic  Filed Vitals:   05/30/14 1505  BP: 155/65  Pulse: 78  Temp: 98.2 F (36.8 C)  Resp: 18   Filed Weights   05/30/14 1505  Weight: 160 lb 8 oz (72.802 kg)    GENERAL:alert, no distress and comfortable SKIN: skin color, texture, turgor are normal, no rashes. She has not excoriated area to her right groin. There is no drainage noted. EYES: normal, conjunctiva are pink and non-injected, sclera clear OROPHARYNX:no exudate, no erythema and lips, buccal mucosa, and tongue normal  NECK: supple, thyroid normal size, non-tender, without nodularity LYMPH:  no palpable lymphadenopathy  in the cervical, axillary or inguinal LUNGS: clear to auscultation and percussion with normal breathing effort HEART: regular rate & rhythm and no murmurs and no lower extremity edema ABDOMEN:abdomen soft, non-tender and normal bowel sounds Musculoskeletal:no cyanosis of digits and no clubbing  PSYCH: alert & oriented x 3 with fluent speech NEURO: no focal motor/sensory deficits  LABORATORY DATA:  I have reviewed the data as listed Lab Results  Component Value Date   WBC 4.3 05/30/2014   HGB 10.6* 05/30/2014   HCT 30.2* 05/30/2014   MCV 99.3 05/30/2014   PLT 79* 05/30/2014    Recent Labs  05/09/14 1206 05/16/14 1307 05/30/14 1454  NA 139 141 142  K 4.5 4.5 3.8  CO2 24 26 22   GLUCOSE 169* 175* 98  BUN 17.6 20.7 17.4  CREATININE 0.8 0.7 0.7  CALCIUM 9.4 9.3 9.2  PROT 6.5 6.4 6.1*  ALBUMIN 3.7 3.7 3.4*  AST 21 21 19   ALT 29 33 22  ALKPHOS 43 41 45  BILITOT 0.67 0.67 0.43   PATHOLOGY REPORT: FINAL DIAGNOSIS Diagnosis 03/11/2014 1. Surgical [P], ascending, biopsy MULTIPLE FRAGMENTS OF TUBULOVILLOUS ADENOMA. PLEASE SEE COMMENT. 2. Surgical [P], sigmoid, polyp HYPERPLASTIC POLYP(S). NO ADENOMATOUS CHANGE OR MALIGNANCY IDENTIFIED. 3. Surgical [P], rectum, biopsy INVASIVE ADENOCARCINOMA. PLEASE SEE COMMENT. Microscopic Comment 1. There is adenomatous epithelium having both tubular and villous growth patterns consistent with a tubulovillous adenoma if the biopsy is representative of the entire lesion. No high grade dysplasia or evidence of malignancy is identified. Clinical correlation is recommended. It is not clear whether this represents the entire lesion noted clinically or whether this is a portion of a larger lesion in which case the changes here may or may not be representative. 3. There are malignant glandular cells arranged in acinar and cribriform patterns with associated desmoplastic stromal reaction. The findings are diagnostic for invasive adenocarcinoma. Case  was discussed with Dr. Fuller Plan on 03-15-2014.  RADIOGRAPHIC STUDIES: I have personally reviewed the radiological images as listed and agreed with the findings in the report.  Ct Abdomen Pelvis W Contrast 03/18/2014    IMPRESSION: Lower rectal wall thickening, likely corresponding to known rectal adenocarcinoma.  No findings suspicious for metastatic disease in the chest, abdomen, or pelvis.   Electronically Signed  By: Julian Hy M.D.   On: 03/18/2014 16:06   Colonoscopy 03/11/2014: COLON FINDINGS: A sessile polyp measuring 18 mm in size was found in the ascending colon. It was not removed due to the adjacent large polyp whcih will require surgical resection. A sessile polyp measuring 40 mm in size was found in the ascending colon. Multiple biopsies were performed. Injection (tattooing) was performed. It was not amenable to endoscopic removal. A sessile polyp measuring 6 mm in size was found in the sigmoid colon. A polypectomy was performed with a cold snare. The resection was complete, the polyp tissue was completely retrieved and sent to histology. A half circumferential firm mass, measuring 4 X 3cm in size, was found in the rectum. It extended from 7-11 cm in the mid rectum. Multiple biopsies of the lesion were performed. The examination was otherwise normal. Retroflexed views revealed no abnormalities. The time to cecum=4 minutes 28 seconds. Withdrawal time=19 minutes 30 seconds. The scope was withdrawn and the procedure completed. COMPLICATIONS: There were no immediate complications. ENDOSCOPIC IMPRESSION: 1. Sessile polyp in the ascending colon-not removed 2. Sessile polyp in the ascending colon; not removed; multiple biopsies performed; Injection (tattooing) performed 3. Sessile polyp in the sigmoid colon; polypectomy performed with a cold snare 4. Rectal mass, measuring 4 X 3 cm; multiple biopsies of the lesion performed  EUS findings: 1. The mass above corresponds with a  hypoechoic lesion that measures 1.5cm in depth, clearly passes into and through the muscularis propria layer of the rectal wall (uT3). 2. No perirectal adenopathy (uN0). ENDOSCOPIC IMPRESSION: 4cm long, non-circumferential, uT3N0 (Stage IIa) rectal adenocarcinoma that lays along the right anterior wall of the rectum wtih distal edge 8cm from the anal verge.  ASSESSMENT & PLAN:  64 year old Caucasian female, with past medical history of hypertension, diabetes, multivessel coronary artery disease, status post 2 stents placement and triple bypass 2 years ago. She presented with rectal bleeding.  1. Rectal adenocarcinoma, cT3N0M0, stage IIA  -I reviewed her imaging, endoscopical, and biopsy findings with her in great details. She has a clinica stage II a mid rectal adenocarcinoma, and the goal of care is curative.  -for T3/T4 rectal cancer, the standard care is neoadjuvant concurrent chemoradiation followed by surgery, and possible adjuvant chemotherapy. -giving her her history of coronary artery disease, I'm little concerned about vascular spasm from Capecitabine. I have spoken with cardiologist Dr. Wynonia Lawman who felt she does not need a stress test before chemo, because her CABG was just 2 years ago and she is asymptomatic.  -She completed radiation along with capecitabine 1500mg  q12hr (M-F) on 05/18/2014 -She has been seen by Dr. Marcello Moores to discuss surgery. Exact date is pending, but will likely be the first or second week of June.  -She will be scheduled back in early July 2016 to decide if she needs adjuvant chemotherapy.  2. Mild thrombocytopenia -Intermittent for the past few years since her CABG -Platelet count remains stable. We will follow this.   2. Hypertension, diabetes, coronary artery disease -She will continue follow-up with her primary care physician and cardiologist -We discussed the risk of coronary artery spasm with chemotherapy.  3. Diarrhea, nausea - likely related to  radiation and chemotherapy; nausea and diarrhea are improving -She knows to take Imodium and Compazine as needed or symptoms.   Plan -Plan is for surgery in early June 2016 -Follow up in early July 2017  -We'll discuss with Dr. Marcello Moores about her restaging scan before surgery.   All questions were answered.  The patient knows to call the clinic with any problems, questions or concerns. I spent 15 minutes counseling the patient face to face. The total time spent in the appointment was 20 minutes and more than 50% was on counseling.     Mikey Bussing, NP 05/30/2014 4:22 PM

## 2014-06-02 ENCOUNTER — Encounter: Payer: Self-pay | Admitting: Radiation Oncology

## 2014-06-02 ENCOUNTER — Other Ambulatory Visit (INDEPENDENT_AMBULATORY_CARE_PROVIDER_SITE_OTHER): Payer: Commercial Managed Care - HMO

## 2014-06-02 ENCOUNTER — Encounter: Payer: Self-pay | Admitting: Family Medicine

## 2014-06-02 DIAGNOSIS — E1151 Type 2 diabetes mellitus with diabetic peripheral angiopathy without gangrene: Secondary | ICD-10-CM

## 2014-06-02 DIAGNOSIS — E119 Type 2 diabetes mellitus without complications: Secondary | ICD-10-CM | POA: Diagnosis not present

## 2014-06-02 DIAGNOSIS — E1159 Type 2 diabetes mellitus with other circulatory complications: Secondary | ICD-10-CM

## 2014-06-02 LAB — HEMOGLOBIN A1C: Hgb A1c MFr Bld: 6.2 % (ref 4.6–6.5)

## 2014-06-03 ENCOUNTER — Encounter: Payer: Self-pay | Admitting: Family Medicine

## 2014-06-03 ENCOUNTER — Ambulatory Visit (INDEPENDENT_AMBULATORY_CARE_PROVIDER_SITE_OTHER): Payer: Commercial Managed Care - HMO | Admitting: Family Medicine

## 2014-06-03 VITALS — BP 134/70 | HR 84 | Temp 98.2°F | Wt 158.0 lb

## 2014-06-03 DIAGNOSIS — R42 Dizziness and giddiness: Secondary | ICD-10-CM

## 2014-06-03 DIAGNOSIS — E1151 Type 2 diabetes mellitus with diabetic peripheral angiopathy without gangrene: Secondary | ICD-10-CM | POA: Diagnosis not present

## 2014-06-03 DIAGNOSIS — E1159 Type 2 diabetes mellitus with other circulatory complications: Secondary | ICD-10-CM

## 2014-06-03 DIAGNOSIS — H9193 Unspecified hearing loss, bilateral: Secondary | ICD-10-CM | POA: Diagnosis not present

## 2014-06-03 NOTE — Patient Instructions (Signed)
We will call you with ENT referral.

## 2014-06-03 NOTE — Progress Notes (Signed)
Subjective:    Patient ID: [name], female    DOB: 1950-05-20, 64 y.o.   MRN: [id number]  HPI Patient seen for medical follow-up. Since she was here last she has been seen by GI for rectal bleeding and diagnosed with rectal cancer. She has just completed chemotherapy and radiation and will have surgery in early July.  Diabetes well controlled with recent A1c 6.2% which is the best this has been in quite some time. Her weight is down 4 pounds from last visit but she states her appetite is excellent. No hypoglycemia. Compliant with medications.  Does complain of some bilateral hearing loss. Progressive in recent years. She had hearing aids in February but states these are not functioning well. She is also having some progressive vertigo symptoms and requesting ENT referral. No ataxia. No dysphagia or speech difficulty.  Past Medical History  Diagnosis Date  . Diabetes mellitus, insulin dependent (IDDM), uncontrolled   . Hyperlipidemia   . Hypertensive heart disease   .  Paroxysmal SVT (ANVRT)     S/p AV nodal ablation Dr. Lovena Le 09/28/08   . Depression   . CAD     Coronary disease status post non-ST elevation MI in May 2010 with  PCI of the left circumflex on Jun 24, 2008. She then had a PCI of  the RCA on July 15, 2008. 02/24/12 Cath, Severe 95% LAD, ostial 95% circ, ostial RCA, patent stents in distal RCA and mid circ normal LV 02/26/12 CABG with LIMA to LAD, SVG to RCA, and SVG to OM Dr. Roxan Hockey    . Cataract   . Cancer     adenocarcinoma of rectum  . S/P radiation therapy 04/11/14-05/18/14    50.4Gy rectal   Past Surgical History  Procedure Laterality Date  . Coronary angioplasty with stent placement    . Coronary artery bypass graft  02/26/2012    Roxan Hockey, MD;  Location: [facility];  Service: Open Heart Surgery;  Laterality: N/A;  CABG x three,  using left internal mammary artery  and left leg greater saphenous vein,   . Endovein harvest of greater saphenous vein  02/26/2012    . Left heart catheterization with coronary angiogram N/A 02/24/2012    Procedure: LEFT HEART CATHETERIZATION WITH CORONARY ANGIOGRAM;  Surgeon: Jacolyn Reedy, MD;  Location: [facility] CATH LAB;  Service: Cardiovascular;  Laterality: N/A;  . Eus N/A 03/24/2014    Procedure: LOWER ENDOSCOPIC ULTRASOUND (EUS);  Surgeon: Milus Banister, MD;  Location: [facility];  Service: Endoscopy;  Laterality: N/A;  . Colonoscopy      reports that she has never smoked. She has never used smokeless tobacco. She reports that she does not drink alcohol or use illicit drugs. family history includes Colon polyps in her maternal grandmother; Diabetes in her brother, father, and sister. There is no history of Colon cancer. No Known Allergies    Review of Systems  Constitutional: Negative for fever and chills.  HENT: Positive for hearing loss.   Respiratory: Negative for shortness of breath and stridor.   Cardiovascular: Negative for chest pain.  Gastrointestinal: Negative for abdominal pain.  Endocrine: Negative for polydipsia and polyuria.  Neurological: Positive for dizziness.       Objective:   Physical Exam  Constitutional: She is oriented to person, place, and time. She appears well-developed and well-nourished.  HENT:  Right Ear: External ear normal.  Left Ear: External ear normal.  Mouth/Throat: Oropharynx is clear and moist.  Neck: Neck supple. No thyromegaly  present.  Cardiovascular: Normal rate and regular rhythm.   Pulmonary/Chest: Effort normal and breath sounds normal. No respiratory distress. She has no wheezes. She has no rales.  Musculoskeletal: She exhibits no edema.  Neurological: She is alert and oriented to person, place, and time. No cranial nerve deficit. Coordination normal.          Assessment & Plan:  #1 type 2 diabetes. Good control (improved) by recent A1c 6.2%. Continue current medications. Reassess A1c in 6 months  #2 bilateral hearing loss. She also has some chronic  vertigo intermittently for years. Patient requesting ENT referral. She recently had hearing aids which have not made much improvement in her hearing. Will set up ENT referral

## 2014-06-03 NOTE — Progress Notes (Signed)
Pre visit review using our clinic review tool, if applicable. No additional management support is needed unless otherwise documented below in the visit note. 

## 2014-06-06 ENCOUNTER — Ambulatory Visit: Payer: Commercial Managed Care - HMO | Admitting: Radiation Oncology

## 2014-06-07 ENCOUNTER — Telehealth: Payer: Self-pay | Admitting: *Deleted

## 2014-06-07 NOTE — Telephone Encounter (Signed)
error 

## 2014-06-08 ENCOUNTER — Ambulatory Visit
Admission: RE | Admit: 2014-06-08 | Payer: Commercial Managed Care - HMO | Source: Ambulatory Visit | Admitting: Radiation Oncology

## 2014-06-08 HISTORY — DX: Personal history of irradiation: Z92.3

## 2014-06-09 DIAGNOSIS — R42 Dizziness and giddiness: Secondary | ICD-10-CM | POA: Diagnosis not present

## 2014-06-09 DIAGNOSIS — H6123 Impacted cerumen, bilateral: Secondary | ICD-10-CM | POA: Diagnosis not present

## 2014-06-09 DIAGNOSIS — H9313 Tinnitus, bilateral: Secondary | ICD-10-CM | POA: Diagnosis not present

## 2014-06-09 DIAGNOSIS — H903 Sensorineural hearing loss, bilateral: Secondary | ICD-10-CM | POA: Diagnosis not present

## 2014-06-10 ENCOUNTER — Other Ambulatory Visit: Payer: Self-pay | Admitting: General Surgery

## 2014-06-10 NOTE — H&P (Signed)
[name] 05/27/2014 10:01 AM Location: [facility] Patient #: [id number] DOB: Dec 12, 1950 Single / Language: [name] / Race: White Female History of Present Illness [name] MD; 07/26/2977 8:28 AM) Patient words: f/u ca rectum.  The patient is a 64 year old female who presents with colorectal cancer. Patient underwent colonoscopy for rectal bleeding with Dr. Fuller Plan. This showed several polyps throughout her colon and a mass in her mid rectum. Her rectal mass biopsies show invasive adenocarcinoma. She underwent a rectal ultrasound which showed a T3 N0 mass. This was found to be approximately 8 cm from the anal verge. There was also a large polyp in the ascending colon that was not removed during colonoscopy. This area was tattooed. She has no family history of colon cancer. CT scans of the chest abdomen and pelvis revealed no signs of metastatic disease. She is having some diarrhea after ChemoRT but has tolerated these reasonably well. Her mass was palpated ~8cm from anal verge.  Problem List/Past Medical [name], MD; 8/92/1194 8:26 AM) PRIMARY CANCER OF RECTUM (154.1  C20) uT3N2 rectal cancer with unresectable polyp in ascending colon.  Other Problems [name], MD; 1/74/0814 8:26 AM) Atrial Fibrillation Diabetes Mellitus Heart murmur High blood pressure Hypercholesterolemia Myocardial infarction Rectal Cancer Colon Cancer  Past Surgical History [name], MD; 4/81/8563 8:25 AM) Colon Polyp Removal - Colonoscopy Coronary Artery Bypass Graft No pertinent past surgical history  Diagnostic Studies History [name], LPN; 1/49/7026 37:85 AM) Colonoscopy within last year Mammogram >3 years ago Pap Smear 1-5 years ago  Allergies Marjean Donna, [place]; 05/27/2014 10:04 AM) No Known Drug Allergies 03/29/2014  Medication History ([name], CMA; 05/27/2014 10:04 AM) Atorvastatin Calcium (20MG  Tablet, Oral) Active. Zoloft  (50MG  Tablet, Oral) Active. Lopressor HCT (50-25MG  Tablet, Oral) Active. MetFORMIN HCl ER (OSM) (1000MG  Tablet ER 24HR, Oral) Active. Levemir (100UNIT/ML Solution, Subcutaneous) Active. NovoLOG FlexPen (100UNIT/ML Soln Pen-inj, Subcutaneous) Active. Lasix (40MG  Tablet, Oral) Active. Fish Oil Concentrate (1000MG  Capsule, Oral) Active. Coral Calcium (1000 (390 Ca)MG Tablet, Oral) Active. Lipitor (20MG  Tablet, Oral) Active. Aspirin (81MG  Tablet DR, Oral) Active. Medications Reconciled  Social History [name], LPN; 8/85/0277 41:28 AM) No alcohol use No caffeine use No drug use Tobacco use Never smoker.  Family History [name], MD; 7/86/7672 8:25 AM) Depression Mother. Diabetes Mellitus Brother, Father, Mother, Sister. Heart Disease Family Members In [place], Son. Respiratory Condition Father. Colon Polyps Family Members In General.  Pregnancy / Birth History [name], MD; 0/94/7096 8:25 AM) Age at menarche 64 years. Age of menopause 83-60 Gravida 1 2 Maternal age 22-25 Para 1 Contraceptive History Intrauterine device. Regular periods     Review of Systems [name] LPN; 2/83/6629 47:65 AM) General Not Present- Appetite Loss, Chills, Fatigue, Fever, Night Sweats, Weight Gain and Weight Loss. Skin Present- New Lesions and Rash. Not Present- Change in Wart/Mole, Dryness, Hives, Jaundice, Non-Healing Wounds and Ulcer. HEENT Present- Hearing Loss, Ringing in the Ears, Visual Disturbances and Wears glasses/contact lenses. Not Present- Earache, Hoarseness, Nose Bleed, Oral Ulcers, Seasonal Allergies, Sinus Pain, Sore Throat and Yellow Eyes. Respiratory Not Present- Bloody sputum, Chronic Cough, Difficulty Breathing, Snoring and Wheezing. Breast Not Present- Breast Mass, Breast Pain, Nipple Discharge and Skin Changes. Cardiovascular Present- Leg Cramps. Not Present- Chest Pain, Difficulty Breathing Lying Down, Palpitations, Rapid Heart  Rate, Shortness of Breath and Swelling of Extremities. Gastrointestinal Present- Change in Bowel Habits. Not Present- Abdominal Pain, Bloating, Bloody Stool, Chronic diarrhea, Constipation, Difficulty Swallowing, Excessive gas, Gets full quickly at meals, Hemorrhoids,  Indigestion, Nausea, Rectal Pain and Vomiting. Female Genitourinary Present- Frequency, Nocturia and Urgency. Not Present- Painful Urination and Pelvic Pain. Neurological Present- Decreased Memory and Tingling. Not Present- Fainting, Headaches, Numbness, Seizures, Tremor, Trouble walking and Weakness. Psychiatric Not Present- Anxiety, Bipolar, Change in Sleep Pattern, Depression, Fearful and Frequent crying. Endocrine Not Present- Cold Intolerance, Excessive Hunger, Hair Changes, Heat Intolerance, Hot flashes and New Diabetes. Hematology Not Present- Easy Bruising, Excessive bleeding, Gland problems, HIV and Persistent Infections.  Vitals ([name] CMA; 05/27/2014 10:02 AM) 05/27/2014 10:01 AM Weight: 160 lb Height: 62in Body Surface Area: 1.78 m Body Mass Index: 29.26 kg/m Temp.: 32F(Temporal)  Pulse: 94 (Regular)  BP: 128/72 (Sitting, Left Arm, Standard)     Physical Exam [name] MD; 8/52/7782 8:28 AM)  General Mental Status-Alert. General Appearance-Consistent with stated age. Hydration-Well hydrated. Voice-Normal.  Head and Neck Head-normocephalic, atraumatic with no lesions or palpable masses. Trachea-midline. Thyroid Gland Characteristics - normal size and consistency.  Eye Eyeball - Bilateral-Extraocular movements intact. Sclera/Conjunctiva - Bilateral-No scleral icterus.  Chest and Lung Exam Chest and lung exam reveals -quiet, even and easy respiratory effort with no use of accessory muscles and on auscultation, normal breath sounds, no adventitious sounds and normal vocal resonance. Inspection Chest Wall - Normal. Back - normal.  Cardiovascular Cardiovascular  examination reveals -normal heart sounds, regular rate and rhythm with no murmurs and normal pedal pulses bilaterally.  Abdomen Inspection Inspection of the abdomen reveals - No Hernias. Palpation/Percussion Palpation and Percussion of the abdomen reveal - Soft, Non Tender, No Rebound tenderness, No Rigidity (guarding) and No hepatosplenomegaly. Auscultation Auscultation of the abdomen reveals - Bowel sounds normal.  Neurologic Neurologic evaluation reveals -alert and oriented x 3 with no impairment of recent or remote memory. Mental Status-Normal.  Musculoskeletal Global Assessment -Note:no gross deformities.  Normal Exam - Left-Upper Extremity Strength Normal and Lower Extremity Strength Normal. Normal Exam - Right-Upper Extremity Strength Normal and Lower Extremity Strength Normal.    Assessment & Plan [name] MD; 05/28/5359 8:27 AM)  PRIMARY CANCER OF RECTUM (154.1  C20) Impression: uT3N0 rectal cancer with unresectable polyp in ascending colon. Biopsies of this polyp showed tubulovillous adenoma with no signs of high-grade dysplasia. Pt has completed neoadjuvant chemRT. I think it would be worth an attempt at endoscopic resection of the ascending polyp to prevent 2 separate colon resections and possible incontinence issues in the future. If there are signs of colon cancer in the ascending mass, she would definitely need a resection of both areas. This would increase the morbidity of her surgery. We discussed both possibilities of her surgery. We discussed all major risks in detail. The surgery and anatomy were described to the patient as well as the risks of surgery and the possible complications. These include: Bleeding, deep abdominal infections and possible wound complications such as hernia and infection, damage to adjacent structures, leak of surgical connections, which can lead to other surgeries and possibly an ostomy, possible need for other procedures,  such as abscess drains in radiology, possible prolonged hospital stay, possible diarrhea from removal of part of the colon, possible constipation from narcotics, possible bowel, bladder or sexual dysfunction if having rectal surgery, prolonged fatigue/weakness or appetite loss, possible early recurrence of of disease, possible complications of their medical problems such as heart disease or arrhythmias or lung problems, death (less than 1%). I believe the patient understands and wishes to proceed with the surgery.

## 2014-06-22 ENCOUNTER — Ambulatory Visit: Payer: Commercial Managed Care - HMO | Admitting: Radiation Oncology

## 2014-06-28 ENCOUNTER — Encounter: Payer: Self-pay | Admitting: *Deleted

## 2014-06-29 ENCOUNTER — Telehealth: Payer: Self-pay | Admitting: Family Medicine

## 2014-06-29 MED ORDER — INSULIN DETEMIR 100 UNIT/ML ~~LOC~~ SOLN: 96.0000 [IU] | Freq: Every day | SUBCUTANEOUS | Status: DC

## 2014-06-29 MED ORDER — INSULIN ASPART 100 UNIT/ML ~~LOC~~ SOLN: SUBCUTANEOUS | Status: DC

## 2014-06-29 NOTE — Telephone Encounter (Signed)
Rx sent to pharmacy   

## 2014-06-29 NOTE — Telephone Encounter (Signed)
Patient is requesting re-fills on insulin detemir (LEVEMIR) 100 UNIT/ML injection and insulin aspart (NOVOLOG) 100 UNIT/ML injection. [facility] [place], [place] - [address]

## 2014-07-05 ENCOUNTER — Encounter (HOSPITAL_COMMUNITY): Payer: Self-pay | Admitting: *Deleted

## 2014-07-12 ENCOUNTER — Ambulatory Visit (HOSPITAL_COMMUNITY): Payer: Commercial Managed Care - HMO | Admitting: Registered Nurse

## 2014-07-12 ENCOUNTER — Encounter (HOSPITAL_COMMUNITY)
Admission: RE | Disposition: A | Discharge status: Home / Self Care | Payer: Self-pay | Source: Ambulatory Visit | Attending: General Surgery

## 2014-07-12 ENCOUNTER — Inpatient Hospital Stay (HOSPITAL_COMMUNITY)
Admission: RE | Admit: 2014-07-12 | Discharge: 2014-07-17 | DRG: 330 | Disposition: A | Discharge status: Home / Self Care | Payer: Commercial Managed Care - HMO | Source: Ambulatory Visit | Attending: General Surgery | Admitting: General Surgery

## 2014-07-12 ENCOUNTER — Encounter (HOSPITAL_COMMUNITY): Payer: Self-pay

## 2014-07-12 DIAGNOSIS — Z7982 Long term (current) use of aspirin: Secondary | ICD-10-CM | POA: Diagnosis not present

## 2014-07-12 DIAGNOSIS — I2510 Atherosclerotic heart disease of native coronary artery without angina pectoris: Secondary | ICD-10-CM | POA: Diagnosis present

## 2014-07-12 DIAGNOSIS — I471 Supraventricular tachycardia: Secondary | ICD-10-CM | POA: Diagnosis not present

## 2014-07-12 DIAGNOSIS — Z833 Family history of diabetes mellitus: Secondary | ICD-10-CM | POA: Diagnosis not present

## 2014-07-12 DIAGNOSIS — D696 Thrombocytopenia, unspecified: Secondary | ICD-10-CM | POA: Diagnosis not present

## 2014-07-12 DIAGNOSIS — Z923 Personal history of irradiation: Secondary | ICD-10-CM | POA: Diagnosis not present

## 2014-07-12 DIAGNOSIS — Z8371 Family history of colonic polyps: Secondary | ICD-10-CM

## 2014-07-12 DIAGNOSIS — C20 Malignant neoplasm of rectum: Secondary | ICD-10-CM | POA: Diagnosis present

## 2014-07-12 DIAGNOSIS — Z6832 Body mass index (BMI) 32.0-32.9, adult: Secondary | ICD-10-CM | POA: Diagnosis not present

## 2014-07-12 DIAGNOSIS — Z951 Presence of aortocoronary bypass graft: Secondary | ICD-10-CM | POA: Diagnosis not present

## 2014-07-12 DIAGNOSIS — I119 Hypertensive heart disease without heart failure: Secondary | ICD-10-CM | POA: Diagnosis not present

## 2014-07-12 DIAGNOSIS — E785 Hyperlipidemia, unspecified: Secondary | ICD-10-CM | POA: Diagnosis present

## 2014-07-12 DIAGNOSIS — Z8659 Personal history of other mental and behavioral disorders: Secondary | ICD-10-CM

## 2014-07-12 DIAGNOSIS — D126 Benign neoplasm of colon, unspecified: Secondary | ICD-10-CM | POA: Diagnosis not present

## 2014-07-12 DIAGNOSIS — K635 Polyp of colon: Secondary | ICD-10-CM | POA: Diagnosis not present

## 2014-07-12 DIAGNOSIS — Z79899 Other long term (current) drug therapy: Secondary | ICD-10-CM

## 2014-07-12 DIAGNOSIS — F329 Major depressive disorder, single episode, unspecified: Secondary | ICD-10-CM | POA: Diagnosis present

## 2014-07-12 DIAGNOSIS — I252 Old myocardial infarction: Secondary | ICD-10-CM | POA: Diagnosis not present

## 2014-07-12 DIAGNOSIS — E1169 Type 2 diabetes mellitus with other specified complication: Secondary | ICD-10-CM | POA: Diagnosis not present

## 2014-07-12 DIAGNOSIS — Z794 Long term (current) use of insulin: Secondary | ICD-10-CM | POA: Diagnosis not present

## 2014-07-12 DIAGNOSIS — E669 Obesity, unspecified: Secondary | ICD-10-CM | POA: Diagnosis present

## 2014-07-12 DIAGNOSIS — K6389 Other specified diseases of intestine: Secondary | ICD-10-CM | POA: Diagnosis not present

## 2014-07-12 DIAGNOSIS — E1159 Type 2 diabetes mellitus with other circulatory complications: Secondary | ICD-10-CM | POA: Diagnosis present

## 2014-07-12 DIAGNOSIS — D122 Benign neoplasm of ascending colon: Secondary | ICD-10-CM | POA: Diagnosis not present

## 2014-07-12 HISTORY — PX: COLONOSCOPY WITH PROPOFOL: SHX5780

## 2014-07-12 HISTORY — DX: Cardiac murmur, unspecified: R01.1

## 2014-07-12 LAB — TYPE AND SCREEN
ABO/RH(D): A POS
Antibody Screen: NEGATIVE

## 2014-07-12 LAB — CBC
HEMATOCRIT: 33.5 % — AB (ref 36.0–46.0)
HEMOGLOBIN: 11.5 g/dL — AB (ref 12.0–15.0)
MCH: 33 pg (ref 26.0–34.0)
MCHC: 34.3 g/dL (ref 30.0–36.0)
MCV: 96 fL (ref 78.0–100.0)
Platelets: 95 10*3/uL — ABNORMAL LOW (ref 150–400)
RBC: 3.49 MIL/uL — ABNORMAL LOW (ref 3.87–5.11)
RDW: 13.4 % (ref 11.5–15.5)
WBC: 3.1 10*3/uL — ABNORMAL LOW (ref 4.0–10.5)

## 2014-07-12 LAB — BASIC METABOLIC PANEL
Anion gap: 9 (ref 5–15)
BUN: 9 mg/dL (ref 6–20)
CHLORIDE: 106 mmol/L (ref 101–111)
CO2: 26 mmol/L (ref 22–32)
Calcium: 9 mg/dL (ref 8.9–10.3)
Creatinine, Ser: 0.49 mg/dL (ref 0.44–1.00)
Glucose, Bld: 141 mg/dL — ABNORMAL HIGH (ref 65–99)
Potassium: 3.5 mmol/L (ref 3.5–5.1)
Sodium: 141 mmol/L (ref 135–145)

## 2014-07-12 LAB — GLUCOSE, CAPILLARY: GLUCOSE-CAPILLARY: 144 mg/dL — AB (ref 65–99)

## 2014-07-12 LAB — ABO/RH: ABO/RH(D): A POS

## 2014-07-12 SURGERY — COLONOSCOPY WITH PROPOFOL
Anesthesia: Monitor Anesthesia Care

## 2014-07-12 MED ORDER — ONDANSETRON HCL 4 MG/2ML IJ SOLN: 4.0000 mg | Freq: Four times a day (QID) | INTRAMUSCULAR | Status: DC | PRN

## 2014-07-12 MED ORDER — PROPOFOL INFUSION 10 MG/ML OPTIME
INTRAVENOUS | Status: DC | PRN
  Administered 2014-07-12: 300 ug/kg/min via INTRAVENOUS

## 2014-07-12 MED ORDER — CHLORHEXIDINE GLUCONATE CLOTH 2 % EX PADS
6.0000 | MEDICATED_PAD | Freq: Once | CUTANEOUS | Status: AC
  Administered 2014-07-13: 6 via TOPICAL

## 2014-07-12 MED ORDER — SPOT INK MARKER SYRINGE KIT
PACK | SUBMUCOSAL | Status: AC
  Filled 2014-07-12: qty 5

## 2014-07-12 MED ORDER — LABETALOL HCL 5 MG/ML IV SOLN
INTRAVENOUS | Status: DC | PRN
  Administered 2014-07-12 (×2): 2.5 mg via INTRAVENOUS

## 2014-07-12 MED ORDER — SODIUM CHLORIDE 0.9 % IV SOLN: INTRAVENOUS | Status: DC

## 2014-07-12 MED ORDER — DEXTROSE 5 % IV SOLN
2.0000 g | INTRAVENOUS | Status: AC
  Administered 2014-07-13: 2 g via INTRAVENOUS

## 2014-07-12 MED ORDER — PROPOFOL 10 MG/ML IV BOLUS
INTRAVENOUS | Status: AC
Start: 2014-07-12 — End: 2014-07-12
  Filled 2014-07-12: qty 20

## 2014-07-12 MED ORDER — LABETALOL HCL 5 MG/ML IV SOLN
INTRAVENOUS | Status: AC
  Filled 2014-07-12: qty 4

## 2014-07-12 MED ORDER — METRONIDAZOLE 500 MG PO TABS
500.0000 mg | ORAL_TABLET | Freq: Four times a day (QID) | ORAL | Status: AC
  Administered 2014-07-12 – 2014-07-13 (×3): 500 mg via ORAL
  Filled 2014-07-12 (×3): qty 1

## 2014-07-12 MED ORDER — LACTATED RINGERS IV SOLN
INTRAVENOUS | Status: DC | PRN
  Administered 2014-07-12: 13:00:00 via INTRAVENOUS

## 2014-07-12 MED ORDER — SERTRALINE HCL 50 MG PO TABS
50.0000 mg | ORAL_TABLET | Freq: Every day | ORAL | Status: DC
  Administered 2014-07-12 – 2014-07-16 (×5): 50 mg via ORAL
  Filled 2014-07-12 (×7): qty 1

## 2014-07-12 MED ORDER — METOPROLOL TARTRATE 25 MG PO TABS
25.0000 mg | ORAL_TABLET | Freq: Two times a day (BID) | ORAL | Status: DC
  Administered 2014-07-12 – 2014-07-17 (×6): 25 mg via ORAL
  Filled 2014-07-12 (×12): qty 1

## 2014-07-12 MED ORDER — MORPHINE SULFATE 2 MG/ML IJ SOLN
2.0000 mg | INTRAMUSCULAR | Status: DC | PRN
  Administered 2014-07-13 – 2014-07-15 (×11): 2 mg via INTRAVENOUS
  Filled 2014-07-12 (×8): qty 1
  Filled 2014-07-12: qty 2
  Filled 2014-07-12: qty 1

## 2014-07-12 MED ORDER — OXYCODONE HCL 5 MG PO TABS
5.0000 mg | ORAL_TABLET | ORAL | Status: DC | PRN
  Administered 2014-07-12 – 2014-07-13 (×2): 5 mg via ORAL
  Filled 2014-07-12 (×2): qty 1

## 2014-07-12 MED ORDER — KCL IN DEXTROSE-NACL 20-5-0.45 MEQ/L-%-% IV SOLN
INTRAVENOUS | Status: DC
  Administered 2014-07-12: 18:00:00 via INTRAVENOUS
  Filled 2014-07-12 (×2): qty 1000

## 2014-07-12 MED ORDER — ALVIMOPAN 12 MG PO CAPS
12.0000 mg | ORAL_CAPSULE | Freq: Once | ORAL | Status: AC
  Administered 2014-07-13: 12 mg via ORAL
  Filled 2014-07-12: qty 1

## 2014-07-12 MED ORDER — PROPOFOL 10 MG/ML IV BOLUS
INTRAVENOUS | Status: AC
  Filled 2014-07-12: qty 20

## 2014-07-12 MED ORDER — LACTATED RINGERS IV SOLN: Freq: Once | INTRAVENOUS | Status: DC

## 2014-07-12 MED ORDER — CHLORHEXIDINE GLUCONATE CLOTH 2 % EX PADS
6.0000 | MEDICATED_PAD | Freq: Once | CUTANEOUS | Status: AC
Start: 2014-07-12 — End: 2014-07-12
  Administered 2014-07-12: 6 via TOPICAL

## 2014-07-12 MED ORDER — ENOXAPARIN SODIUM 40 MG/0.4ML ~~LOC~~ SOLN
40.0000 mg | SUBCUTANEOUS | Status: DC
  Filled 2014-07-12: qty 0.4

## 2014-07-12 NOTE — Anesthesia Postprocedure Evaluation (Signed)
  Anesthesia Post-op Note  Patient: [name]  Procedure(s) Performed: Procedure(s) (LRB): COLONOSCOPY WITH PROPOFOL POSSIBLE POLYP RESECTION (N/A)  Patient Location: PACU  Anesthesia Type: MAC  Level of Consciousness: awake and alert   Airway and Oxygen Therapy: Patient Spontanous Breathing  Post-op Pain: mild  Post-op Assessment: Post-op Vital signs reviewed, Patient's Cardiovascular Status Stable, Respiratory Function Stable, Patent Airway and No signs of Nausea or vomiting  Last Vitals:  Filed Vitals:   07/12/14 1346  BP: 108/59  Pulse: 81  Temp:   Resp: 13    Post-op Vital Signs: stable   Complications: No apparent anesthesia complications

## 2014-07-12 NOTE — Op Note (Addendum)
[facility] [place], [address]   COLONOSCOPY PROCEDURE REPORT     EXAM DATE: 07/12/2014  PATIENT NAME:      [name], [name]           MR #:      [id number]  BIRTHDATE:       September 05, 1950      VISIT #:     [id number]  ATTENDING:     Rosario Adie, MD     STATUS:     outpatient ASSISTANT:      William Dalton and Jeb Levering   INDICATIONS:  The patient is a 64 yr old female here for a colonoscopy due to follow up of adenomatous colonic polyp(s). PROCEDURE PERFORMED:     Colonoscopy, diagnostic MEDICATIONS:     Per Anesthesia ESTIMATED BLOOD LOSS:     None  CONSENT: The patient understands the risks and benefits of the procedure and understands that these risks include, but are not limited to: sedation, allergic reaction, infection, perforation and/or bleeding. Alternative means of evaluation and treatment include, among others: physical exam, x-rays, and/or surgical intervention. The patient elects to proceed with this endoscopic procedure.  DESCRIPTION OF PROCEDURE: During intra-op preparation period all mechanical & medical equipment was checked for proper function. Hand hygiene and appropriate measures for infection prevention was taken. After the risks, benefits and alternatives of the procedure were thoroughly explained, Informed consent was verified, confirmed and timeout was successfully executed by the treatment team. A digital exam revealed no rectal mass. The EC-[id number] ([id number]) endoscope was introduced through the anus and advanced to the ascending colon. adequate The instrument was then slowly withdrawn as the colon was fully examined.Estimated blood loss is zero unless otherwise noted in this procedure report.   COLON FINDINGS: Two fungating pedunculated polyps measuring 45 mm in size were found in the ascending colon.  They were unable to be removed endoscopically.  Retroflexion was not performed. The scope was  then completely withdrawn from the patient and the procedure terminated. The rectal cancer site was identified in the mid rectum with good response to treatment.  Both areas had evidence of tattoo.     ADVERSE EVENTS:      There were no immediate complications.  IMPRESSIONS:     Two pedunculated polyps were found in the ascending colon.  These were almost completely circumerential and appeared larger than the previous colonoscopy.  RECOMMENDATIONS:     Proceed to surgery   _____________________________ Rosario Adie, MD eSigned:  Rosario Adie, MD 60/05/5995 1:58 PM      CPT CODES: ICD CODES:  The ICD and CPT codes recommended by this software are interpretations from the data that the clinical staff has captured with the software.  The verification of the translation of this report to the ICD and CPT codes and modifiers is the sole responsibility of the health care institution and practicing physician where this report was generated.  [employer]. will not be held responsible for the validity of the ICD and CPT codes included on this report.  AMA assumes no liability for data contained or not contained herein. CPT is a Designer, television/film set of the [employer].

## 2014-07-12 NOTE — Transfer of Care (Signed)
Immediate Anesthesia Transfer of Care Note  Patient: [name]  Procedure(s) Performed: Procedure(s) with comments: COLONOSCOPY WITH PROPOFOL POSSIBLE POLYP RESECTION (N/A) -  to be admitted after colonoscopy-robotic surgery on 6/8  Patient Location: PACU  Anesthesia Type:MAC  Level of Consciousness: awake, alert , oriented and patient cooperative  Airway & Oxygen Therapy: Patient Spontanous Breathing and Patient connected to face mask oxygen  Post-op Assessment: Report given to RN, Post -op Vital signs reviewed and stable, Patient moving all extremities and Patient moving all extremities X 4  Post vital signs: stable  Last Vitals:  Filed Vitals:   07/12/14 1346  BP: 108/59  Pulse: 81  Temp:   Resp: 13    Complications: No apparent anesthesia complications

## 2014-07-12 NOTE — H&P (Signed)
The patient is a 64 year old female who presents with colorectal cancer. Patient underwent colonoscopy for rectal bleeding with Dr. Fuller Plan. This showed several polyps throughout her colon and a mass in her mid rectum. Her rectal mass biopsies show invasive adenocarcinoma. She underwent a rectal ultrasound which showed a T3 N0 mass. This was found to be approximately 8 cm from the anal verge. There was also a large polyp in the ascending colon that was not removed during colonoscopy. This area was tattooed. She has no family history of colon cancer. CT scans of the chest abdomen and pelvis revealed no signs of metastatic disease. She is having some diarrhea after ChemoRT but has tolerated these reasonably well. Her mass was palpated ~8cm from anal verge.  Problem List/Past Medical [name], MD; 05/31/8339 8:26 AM)  PRIMARY CANCER OF RECTUM (154.1  C20) uT3N2 rectal cancer with unresectable polyp in ascending colon.  Other Problems  Atrial Fibrillation Diabetes Mellitus Heart murmur High blood pressure Hypercholesterolemia Myocardial infarction Rectal Cancer Colon Cancer  Past Surgical History  Colon Polyp Removal - Colonoscopy Coronary Artery Bypass Graft No pertinent past surgical history  Diagnostic Studies History  Colonoscopy within last year Mammogram >3 years ago Pap Smear 1-5 years ago  Allergies  No Known Drug Allergies 03/29/2014  Medication History  Atorvastatin Calcium (20MG  Tablet, Oral) Active. Zoloft (50MG  Tablet, Oral) Active. Lopressor HCT (50-25MG  Tablet, Oral) Active. MetFORMIN HCl ER (OSM) (1000MG  Tablet ER 24HR, Oral) Active. Levemir (100UNIT/ML Solution, Subcutaneous) Active. NovoLOG FlexPen (100UNIT/ML Soln Pen-inj, Subcutaneous) Active. Lasix (40MG  Tablet, Oral) Active. Fish Oil Concentrate (1000MG  Capsule, Oral) Active. Coral Calcium (1000 (390 Ca)MG Tablet, Oral) Active. Lipitor (20MG  Tablet, Oral)  Active. Aspirin (81MG  Tablet DR, Oral) Active. Medications Reconciled  Social History  No alcohol use No caffeine use No drug use Tobacco use Never smoker.  Family History  Depression Mother. Diabetes Mellitus Brother, Father, Mother, Sister. Heart Disease Family Members In [place], Son. Respiratory Condition Father. Colon Polyps Family Members In General.  Pregnancy / Birth History  Age at menarche 30 years. Age of menopause 71-60 Gravida 1 2 Maternal age 40-25 Para 1 Contraceptive History Intrauterine device. Regular periods     Review of Systems  General Not Present- Appetite Loss, Chills, Fatigue, Fever, Night Sweats, Weight Gain and Weight Loss. Skin Present- New Lesions and Rash. Not Present- Change in Wart/Mole, Dryness, Hives, Jaundice, Non-Healing Wounds and Ulcer. HEENT Present- Hearing Loss, Ringing in the Ears, Visual Disturbances and Wears glasses/contact lenses. Not Present- Earache, Hoarseness, Nose Bleed, Oral Ulcers, Seasonal Allergies, Sinus Pain, Sore Throat and Yellow Eyes. Respiratory Not Present- Bloody sputum, Chronic Cough, Difficulty Breathing, Snoring and Wheezing. Breast Not Present- Breast Mass, Breast Pain, Nipple Discharge and Skin Changes. Cardiovascular Present- Leg Cramps. Not Present- Chest Pain, Difficulty Breathing Lying Down, Palpitations, Rapid Heart Rate, Shortness of Breath and Swelling of Extremities. Gastrointestinal Present- Change in Bowel Habits. Not Present- Abdominal Pain, Bloating, Bloody Stool, Chronic diarrhea, Constipation, Difficulty Swallowing, Excessive gas, Gets full quickly at meals, Hemorrhoids, Indigestion, Nausea, Rectal Pain and Vomiting. Female Genitourinary Present- Frequency, Nocturia and Urgency. Not Present- Painful Urination and Pelvic Pain. Neurological Present- Decreased Memory and Tingling. Not Present- Fainting, Headaches, Numbness, Seizures, Tremor, Trouble walking and Weakness. Psychiatric  Not Present- Anxiety, Bipolar, Change in Sleep Pattern, Depression, Fearful and Frequent crying. Endocrine Not Present- Cold Intolerance, Excessive Hunger, Hair Changes, Heat Intolerance, Hot flashes and New Diabetes. Hematology Not Present- Easy Bruising, Excessive bleeding, Gland problems, HIV and Persistent Infections.  BP  116/64 mmHg  Pulse 82  Temp(Src) 98 F (36.7 C) (Oral)  Resp 18  Ht 5\' 2"  (1.575 m)  Wt 72.576 kg (160 lb)  BMI 29.26 kg/m2  SpO2 96%   Physical Exam   General Mental Status-Alert. General Appearance-Consistent with stated age. Hydration-Well hydrated. Voice-Normal.  Head and Neck Head-normocephalic, atraumatic with no lesions or palpable masses. Trachea-midline. Thyroid Gland Characteristics - normal size and consistency.  Eye Eyeball - Bilateral-Extraocular movements intact. Sclera/Conjunctiva - Bilateral-No scleral icterus.  Chest and Lung Exam Chest and lung exam reveals -quiet, even and easy respiratory effort with no use of accessory muscles and on auscultation, normal breath sounds, no adventitious sounds and normal vocal resonance. Inspection Chest Wall - Normal. Back - normal.  Cardiovascular Cardiovascular examination reveals -normal heart sounds, regular rate and rhythm with no murmurs and normal pedal pulses bilaterally.  Abdomen Inspection Inspection of the abdomen reveals - No Hernias. Palpation/Percussion Palpation and Percussion of the abdomen reveal - Soft, Non Tender, No Rebound tenderness, No Rigidity (guarding) and No hepatosplenomegaly. Auscultation Auscultation of the abdomen reveals - Bowel sounds normal.  Neurologic Neurologic evaluation reveals -alert and oriented x 3 with no impairment of recent or remote memory. Mental Status-Normal.  Musculoskeletal Global Assessment -Note:no gross deformities.  Normal Exam - Left-Upper Extremity Strength Normal and Lower Extremity Strength  Normal. Normal Exam - Right-Upper Extremity Strength Normal and Lower Extremity Strength Normal.    Assessment & Plan [name] MD; 0/30/0923 8:27 AM)  PRIMARY CANCER OF RECTUM (154.1  C20) Impression: uT3N0 rectal cancer with unresectable polyp in ascending colon. Biopsies of this polyp showed tubulovillous adenoma with no signs of high-grade dysplasia. Pt has completed neoadjuvant chemRT. I think it would be worth an attempt at endoscopic resection of the ascending polyp to prevent 2 separate colon resections and possible incontinence issues in the future. If there are signs of colon cancer in the ascending mass, she would definitely need a resection of both areas. This would increase the morbidity of her surgery.  We will attempt colonoscopic resection today.  We discussed risks, which include bleeding, perforation and recurrence.  We discussed both possibilities of her surgery. We discussed all major risks in detail. The surgery and anatomy were described to the patient as well as the risks of surgery and the possible complications. These include: Bleeding, deep abdominal infections and possible wound complications such as hernia and infection, damage to adjacent structures, leak of surgical connections, which can lead to other surgeries and possibly an ostomy, possible need for other procedures, such as abscess drains in radiology, possible prolonged hospital stay, possible diarrhea from removal of part of the colon, possible constipation from narcotics, possible bowel, bladder or sexual dysfunction if having rectal surgery, prolonged fatigue/weakness or appetite loss, possible early recurrence of of disease, possible complications of their medical problems such as heart disease or arrhythmias or lung problems, death (less than 1%). I believe the patient understands and wishes to proceed with the surgery.

## 2014-07-12 NOTE — Anesthesia Preprocedure Evaluation (Addendum)
Anesthesia Evaluation  Patient identified by MRN, date of birth, ID band Patient awake    Reviewed: Allergy & Precautions, H&P , NPO status , Patient's Chart, lab work & pertinent test results, reviewed documented beta blocker date and time   Airway Mallampati: II  TM Distance: >3 FB Neck ROM: full    Dental  (+) Dental Advisory Given, Missing Left upper front tooth missing:   Pulmonary neg pulmonary ROS,  breath sounds clear to auscultation  Pulmonary exam normal       Cardiovascular + CAD, + Past MI, + Cardiac Stents and + CABG Normal cardiovascular exam+ dysrhythmias Supra Ventricular Tachycardia Rhythm:regular Rate:Normal  S/p AV node ablation. NSTEMI   Neuro/Psych negative neurological ROS  negative psych ROS   GI/Hepatic negative GI ROS, Neg liver ROS,   Endo/Other  diabetes, Well Controlled, Type 2, Insulin Dependent, Oral Hypoglycemic Agents  Renal/GU negative Renal ROS  negative genitourinary   Musculoskeletal   Abdominal   Peds  Hematology negative hematology ROS (+) Platelets 95 K   Anesthesia Other Findings   Reproductive/Obstetrics negative OB ROS                           Anesthesia Physical Anesthesia Plan  ASA: III  Anesthesia Plan: General   Post-op Pain Management:    Induction: Intravenous  Airway Management Planned: Oral ETT  Additional Equipment:   Intra-op Plan:   Post-operative Plan: Extubation in OR  Informed Consent: I have reviewed the patients History and Physical, chart, labs and discussed the procedure including the risks, benefits and alternatives for the proposed anesthesia with the patient or authorized representative who has indicated his/her understanding and acceptance.   Dental Advisory Given  Plan Discussed with: CRNA and Surgeon  Anesthesia Plan Comments:         Anesthesia Quick Evaluation

## 2014-07-12 NOTE — Anesthesia Preprocedure Evaluation (Addendum)
Anesthesia Evaluation  Patient identified by MRN, date of birth, ID band Patient awake    Reviewed: Allergy & Precautions, NPO status , Patient's Chart, lab work & pertinent test results  Airway Mallampati: II  TM Distance: >3 FB Neck ROM: Full    Dental no notable dental hx.    Pulmonary neg pulmonary ROS,  breath sounds clear to auscultation  Pulmonary exam normal       Cardiovascular + Cardiac Stents and + CABG Normal cardiovascular exam+ dysrhythmias (s/p ablation) Atrial Fibrillation and Supra Ventricular Tachycardia Rhythm:Regular Rate:Normal     Neuro/Psych negative neurological ROS  negative psych ROS   GI/Hepatic negative GI ROS, Neg liver ROS,   Endo/Other  diabetes, Insulin Dependent  Renal/GU negative Renal ROS  negative genitourinary   Musculoskeletal negative musculoskeletal ROS (+)   Abdominal   Peds negative pediatric ROS (+)  Hematology negative hematology ROS (+)   Anesthesia Other Findings   Reproductive/Obstetrics negative OB ROS                            Anesthesia Physical Anesthesia Plan  ASA: III  Anesthesia Plan: MAC   Post-op Pain Management:    Induction:   Airway Management Planned: Simple Face Mask  Additional Equipment:   Intra-op Plan:   Post-operative Plan:   Informed Consent: I have reviewed the patients History and Physical, chart, labs and discussed the procedure including the risks, benefits and alternatives for the proposed anesthesia with the patient or authorized representative who has indicated his/her understanding and acceptance.   Dental advisory given  Plan Discussed with: CRNA  Anesthesia Plan Comments:         Anesthesia Quick Evaluation

## 2014-07-13 ENCOUNTER — Inpatient Hospital Stay (HOSPITAL_COMMUNITY): Payer: Commercial Managed Care - HMO | Admitting: Anesthesiology

## 2014-07-13 ENCOUNTER — Encounter (HOSPITAL_COMMUNITY): Payer: Self-pay | Admitting: Certified Registered Nurse Anesthetist

## 2014-07-13 ENCOUNTER — Inpatient Hospital Stay (HOSPITAL_COMMUNITY)
Admission: RE | Admit: 2014-07-13 | Payer: Commercial Managed Care - HMO | Source: Ambulatory Visit | Admitting: General Surgery

## 2014-07-13 ENCOUNTER — Encounter (HOSPITAL_COMMUNITY)
Admission: RE | Disposition: A | Discharge status: Home / Self Care | Payer: Self-pay | Source: Ambulatory Visit | Attending: General Surgery

## 2014-07-13 LAB — BASIC METABOLIC PANEL
Anion gap: 9 (ref 5–15)
BUN: 8 mg/dL (ref 6–20)
CALCIUM: 8.1 mg/dL — AB (ref 8.9–10.3)
CO2: 23 mmol/L (ref 22–32)
Chloride: 103 mmol/L (ref 101–111)
Creatinine, Ser: 0.57 mg/dL (ref 0.44–1.00)
GFR calc Af Amer: >60 mL/min (ref >60)
GFR calc non Af Amer: >60 mL/min (ref >60)
Glucose, Bld: 286 mg/dL — ABNORMAL HIGH (ref 65–99)
Potassium: 3.8 mmol/L (ref 3.5–5.1)
Sodium: 135 mmol/L (ref 135–145)

## 2014-07-13 LAB — CBC
HEMATOCRIT: 30.7 % — AB (ref 36.0–46.0)
HEMOGLOBIN: 10.6 g/dL — AB (ref 12.0–15.0)
MCH: 33.2 pg (ref 26.0–34.0)
MCHC: 34.5 g/dL (ref 30.0–36.0)
MCV: 96.2 fL (ref 78.0–100.0)
Platelets: 121 10*3/uL — ABNORMAL LOW (ref 150–400)
RBC: 3.19 MIL/uL — ABNORMAL LOW (ref 3.87–5.11)
RDW: 13.1 % (ref 11.5–15.5)
WBC: 6.5 10*3/uL (ref 4.0–10.5)

## 2014-07-13 LAB — SURGICAL PCR SCREEN
MRSA, PCR: NEGATIVE
Staphylococcus aureus: NEGATIVE

## 2014-07-13 LAB — GLUCOSE, CAPILLARY
Glucose-Capillary: 188 mg/dL — ABNORMAL HIGH (ref 65–99)
Glucose-Capillary: 233 mg/dL — ABNORMAL HIGH (ref 65–99)
Glucose-Capillary: 225 mg/dL — ABNORMAL HIGH (ref 65–99)

## 2014-07-13 LAB — TROPONIN I: Troponin I: <0.03 ng/mL (ref <0.031)

## 2014-07-13 SURGERY — COLECTOMY, PARTIAL, ROBOT-ASSISTED, LAPAROSCOPIC
Anesthesia: General | Site: Abdomen

## 2014-07-13 MED ORDER — LIDOCAINE HCL (CARDIAC) 20 MG/ML IV SOLN
INTRAVENOUS | Status: AC
  Filled 2014-07-13: qty 5

## 2014-07-13 MED ORDER — NEOSTIGMINE METHYLSULFATE 10 MG/10ML IV SOLN
INTRAVENOUS | Status: DC | PRN
  Administered 2014-07-13: 5 mg via INTRAVENOUS

## 2014-07-13 MED ORDER — ESMOLOL HCL 10 MG/ML IV SOLN
INTRAVENOUS | Status: AC
  Filled 2014-07-13: qty 10

## 2014-07-13 MED ORDER — FENTANYL CITRATE (PF) 100 MCG/2ML IJ SOLN
INTRAMUSCULAR | Status: DC | PRN
  Administered 2014-07-13 (×3): 50 ug via INTRAVENOUS
  Administered 2014-07-13: 100 ug via INTRAVENOUS
  Administered 2014-07-13 (×5): 50 ug via INTRAVENOUS

## 2014-07-13 MED ORDER — DIPHENHYDRAMINE HCL 12.5 MG/5ML PO ELIX: 12.5000 mg | ORAL_SOLUTION | Freq: Four times a day (QID) | ORAL | Status: DC | PRN

## 2014-07-13 MED ORDER — LACTATED RINGERS IV SOLN
INTRAVENOUS | Status: DC
  Administered 2014-07-13: 18:00:00 via INTRAVENOUS

## 2014-07-13 MED ORDER — METOPROLOL TARTRATE 1 MG/ML IV SOLN
2.5000 mg | INTRAVENOUS | Status: AC
  Administered 2014-07-13 (×4): 2.5 mg via INTRAVENOUS

## 2014-07-13 MED ORDER — METOPROLOL TARTRATE 1 MG/ML IV SOLN
5.0000 mg | INTRAVENOUS | Status: AC | PRN
  Administered 2014-07-13 (×2): 5 mg via INTRAVENOUS

## 2014-07-13 MED ORDER — BUPIVACAINE-EPINEPHRINE 0.25% -1:200000 IJ SOLN
INTRAMUSCULAR | Status: DC | PRN
  Administered 2014-07-13: 15 mL

## 2014-07-13 MED ORDER — PROMETHAZINE HCL 25 MG/ML IJ SOLN: 6.2500 mg | INTRAMUSCULAR | Status: DC | PRN

## 2014-07-13 MED ORDER — ESMOLOL HCL 10 MG/ML IV SOLN
INTRAVENOUS | Status: DC | PRN
  Administered 2014-07-13 (×2): 20 mg via INTRAVENOUS
  Administered 2014-07-13: 40 mg via INTRAVENOUS
  Administered 2014-07-13 (×6): 20 mg via INTRAVENOUS

## 2014-07-13 MED ORDER — PROPOFOL 10 MG/ML IV BOLUS
INTRAVENOUS | Status: AC
  Filled 2014-07-13: qty 20

## 2014-07-13 MED ORDER — ENOXAPARIN SODIUM 40 MG/0.4ML ~~LOC~~ SOLN
40.0000 mg | SUBCUTANEOUS | Status: DC
  Administered 2014-07-14 – 2014-07-17 (×4): 40 mg via SUBCUTANEOUS
  Filled 2014-07-13 (×5): qty 0.4

## 2014-07-13 MED ORDER — LACTATED RINGERS IV SOLN
INTRAVENOUS | Status: DC | PRN
  Administered 2014-07-13: 08:00:00 via INTRAVENOUS

## 2014-07-13 MED ORDER — CETYLPYRIDINIUM CHLORIDE 0.05 % MT LIQD
7.0000 mL | Freq: Two times a day (BID) | OROMUCOSAL | Status: DC
  Administered 2014-07-13 – 2014-07-15 (×5): 7 mL via OROMUCOSAL

## 2014-07-13 MED ORDER — INSULIN ASPART 100 UNIT/ML ~~LOC~~ SOLN
0.0000 [IU] | SUBCUTANEOUS | Status: DC
  Administered 2014-07-13 (×3): 7 [IU] via SUBCUTANEOUS
  Administered 2014-07-14: 3 [IU] via SUBCUTANEOUS
  Administered 2014-07-14: 4 [IU] via SUBCUTANEOUS
  Administered 2014-07-14: 3 [IU] via SUBCUTANEOUS
  Administered 2014-07-14 – 2014-07-15 (×3): 4 [IU] via SUBCUTANEOUS
  Administered 2014-07-15 – 2014-07-16 (×2): 3 [IU] via SUBCUTANEOUS
  Administered 2014-07-16: 7 [IU] via SUBCUTANEOUS

## 2014-07-13 MED ORDER — LACTATED RINGERS IV SOLN: INTRAVENOUS | Status: DC

## 2014-07-13 MED ORDER — INDOCYANINE GREEN 25 MG IV SOLR
INTRAVENOUS | Status: DC | PRN
  Administered 2014-07-13 (×2): 2.5 mg via INTRAVENOUS

## 2014-07-13 MED ORDER — ACETAMINOPHEN 500 MG PO TABS
1000.0000 mg | ORAL_TABLET | Freq: Four times a day (QID) | ORAL | Status: AC
Start: 2014-07-13 — End: 2014-07-14
  Administered 2014-07-13 – 2014-07-14 (×3): 1000 mg via ORAL
  Filled 2014-07-13 (×3): qty 2

## 2014-07-13 MED ORDER — PHENYLEPHRINE 40 MCG/ML (10ML) SYRINGE FOR IV PUSH (FOR BLOOD PRESSURE SUPPORT)
PREFILLED_SYRINGE | INTRAVENOUS | Status: AC
  Filled 2014-07-13: qty 10

## 2014-07-13 MED ORDER — FENTANYL CITRATE (PF) 250 MCG/5ML IJ SOLN
INTRAMUSCULAR | Status: AC
  Filled 2014-07-13: qty 5

## 2014-07-13 MED ORDER — EPHEDRINE SULFATE 50 MG/ML IJ SOLN
INTRAMUSCULAR | Status: AC
  Filled 2014-07-13: qty 1

## 2014-07-13 MED ORDER — MIDAZOLAM HCL 2 MG/2ML IJ SOLN
INTRAMUSCULAR | Status: AC
  Filled 2014-07-13: qty 2

## 2014-07-13 MED ORDER — LACTATED RINGERS IV SOLN
INTRAVENOUS | Status: DC | PRN
  Administered 2014-07-13 (×4): via INTRAVENOUS

## 2014-07-13 MED ORDER — GLYCOPYRROLATE 0.2 MG/ML IJ SOLN
INTRAMUSCULAR | Status: AC
  Filled 2014-07-13: qty 3

## 2014-07-13 MED ORDER — DEXAMETHASONE SODIUM PHOSPHATE 10 MG/ML IJ SOLN
INTRAMUSCULAR | Status: DC | PRN
  Administered 2014-07-13: 10 mg via INTRAVENOUS

## 2014-07-13 MED ORDER — SODIUM CHLORIDE 0.9 % IJ SOLN
INTRAMUSCULAR | Status: AC
  Filled 2014-07-13: qty 10

## 2014-07-13 MED ORDER — HYDROMORPHONE HCL 1 MG/ML IJ SOLN
0.2500 mg | INTRAMUSCULAR | Status: DC | PRN
  Administered 2014-07-13 (×2): 0.5 mg via INTRAVENOUS

## 2014-07-13 MED ORDER — SUCCINYLCHOLINE CHLORIDE 20 MG/ML IJ SOLN
INTRAMUSCULAR | Status: DC | PRN
  Administered 2014-07-13: 100 mg via INTRAVENOUS

## 2014-07-13 MED ORDER — ONDANSETRON HCL 4 MG/2ML IJ SOLN
INTRAMUSCULAR | Status: DC | PRN
  Administered 2014-07-13: 4 mg via INTRAVENOUS

## 2014-07-13 MED ORDER — LIDOCAINE HCL (CARDIAC) 20 MG/ML IV SOLN
INTRAVENOUS | Status: DC | PRN
  Administered 2014-07-13: 100 mg via INTRAVENOUS

## 2014-07-13 MED ORDER — LACTATED RINGERS IR SOLN
Status: DC | PRN
  Administered 2014-07-13: 2000 mL

## 2014-07-13 MED ORDER — LABETALOL HCL 5 MG/ML IV SOLN
INTRAVENOUS | Status: DC | PRN
  Administered 2014-07-13 (×4): 5 mg via INTRAVENOUS

## 2014-07-13 MED ORDER — METOPROLOL TARTRATE 1 MG/ML IV SOLN
5.0000 mg | Freq: Four times a day (QID) | INTRAVENOUS | Status: DC | PRN
  Filled 2014-07-13: qty 5

## 2014-07-13 MED ORDER — PROPOFOL 10 MG/ML IV BOLUS
INTRAVENOUS | Status: DC | PRN
  Administered 2014-07-13: 120 mg via INTRAVENOUS

## 2014-07-13 MED ORDER — ROCURONIUM BROMIDE 100 MG/10ML IV SOLN
INTRAVENOUS | Status: DC | PRN
  Administered 2014-07-13: 30 mg via INTRAVENOUS
  Administered 2014-07-13: 10 mg via INTRAVENOUS
  Administered 2014-07-13: 20 mg via INTRAVENOUS
  Administered 2014-07-13: 5 mg via INTRAVENOUS
  Administered 2014-07-13: 50 mg via INTRAVENOUS
  Administered 2014-07-13: 10 mg via INTRAVENOUS
  Administered 2014-07-13: 20 mg via INTRAVENOUS

## 2014-07-13 MED ORDER — DEXTROSE 5 % IV SOLN
2.0000 g | Freq: Two times a day (BID) | INTRAVENOUS | Status: AC
Start: 2014-07-13 — End: 2014-07-13
  Administered 2014-07-13: 2 g via INTRAVENOUS
  Filled 2014-07-13: qty 2

## 2014-07-13 MED ORDER — 0.9 % SODIUM CHLORIDE (POUR BTL) OPTIME
TOPICAL | Status: DC | PRN
  Administered 2014-07-13: 2000 mL

## 2014-07-13 MED ORDER — SODIUM CHLORIDE 0.9 % IV SOLN: INTRAVENOUS | Status: DC | PRN

## 2014-07-13 MED ORDER — ALVIMOPAN 12 MG PO CAPS
12.0000 mg | ORAL_CAPSULE | Freq: Two times a day (BID) | ORAL | Status: DC
  Administered 2014-07-14 – 2014-07-16 (×6): 12 mg via ORAL
  Filled 2014-07-13 (×8): qty 1

## 2014-07-13 MED ORDER — LABETALOL HCL 5 MG/ML IV SOLN
INTRAVENOUS | Status: AC
  Filled 2014-07-13: qty 4

## 2014-07-13 MED ORDER — MEPERIDINE HCL 25 MG/ML IJ SOLN: 6.2500 mg | INTRAMUSCULAR | Status: DC | PRN

## 2014-07-13 MED ORDER — HYDROMORPHONE HCL 1 MG/ML IJ SOLN
INTRAMUSCULAR | Status: DC | PRN
  Administered 2014-07-13: 0.5 mg via INTRAVENOUS
  Administered 2014-07-13: 1 mg via INTRAVENOUS
  Administered 2014-07-13: 0.5 mg via INTRAVENOUS

## 2014-07-13 MED ORDER — ROCURONIUM BROMIDE 100 MG/10ML IV SOLN
INTRAVENOUS | Status: AC
  Filled 2014-07-13: qty 1

## 2014-07-13 MED ORDER — GLYCOPYRROLATE 0.2 MG/ML IJ SOLN
INTRAMUSCULAR | Status: AC
  Filled 2014-07-13: qty 1

## 2014-07-13 MED ORDER — EPHEDRINE SULFATE 50 MG/ML IJ SOLN
INTRAMUSCULAR | Status: DC | PRN
  Administered 2014-07-13 (×2): 5 mg via INTRAVENOUS

## 2014-07-13 MED ORDER — PHENYLEPHRINE HCL 10 MG/ML IJ SOLN
INTRAMUSCULAR | Status: DC | PRN
  Administered 2014-07-13 (×3): 40 ug via INTRAVENOUS

## 2014-07-13 MED ORDER — DEXAMETHASONE SODIUM PHOSPHATE 10 MG/ML IJ SOLN
INTRAMUSCULAR | Status: AC
  Filled 2014-07-13: qty 1

## 2014-07-13 MED ORDER — DIPHENHYDRAMINE HCL 50 MG/ML IJ SOLN
12.5000 mg | Freq: Four times a day (QID) | INTRAMUSCULAR | Status: DC | PRN
  Administered 2014-07-15: 12.5 mg via INTRAVENOUS
  Filled 2014-07-13: qty 1

## 2014-07-13 MED ORDER — GLYCOPYRROLATE 0.2 MG/ML IJ SOLN
INTRAMUSCULAR | Status: DC | PRN
  Administered 2014-07-13: 0.6 mg via INTRAVENOUS
  Administered 2014-07-13: 0.2 mg via INTRAVENOUS

## 2014-07-13 MED ORDER — ARTIFICIAL TEARS OP OINT
TOPICAL_OINTMENT | OPHTHALMIC | Status: AC
  Filled 2014-07-13: qty 3.5

## 2014-07-13 MED ORDER — MIDAZOLAM HCL 5 MG/5ML IJ SOLN
INTRAMUSCULAR | Status: DC | PRN
  Administered 2014-07-13: 2 mg via INTRAVENOUS

## 2014-07-13 MED ORDER — NEOSTIGMINE METHYLSULFATE 10 MG/10ML IV SOLN
INTRAVENOUS | Status: AC
  Filled 2014-07-13: qty 1

## 2014-07-13 MED ORDER — HYDROMORPHONE HCL 2 MG/ML IJ SOLN
INTRAMUSCULAR | Status: AC
  Filled 2014-07-13: qty 1

## 2014-07-13 MED ORDER — ONDANSETRON HCL 4 MG/2ML IJ SOLN
INTRAMUSCULAR | Status: AC
  Filled 2014-07-13: qty 2

## 2014-07-13 SURGICAL SUPPLY — 93 items
BLADE EXTENDED COATED 6.5IN (ELECTRODE) ×3 IMPLANT
BLADE SURG SZ11 CARB STEEL (BLADE) ×3 IMPLANT
CANNULA REDUC XI 12-8 STAPL (CANNULA) ×1
CANNULA REDUC XI 12-8MM STAPL (CANNULA) ×1
CANNULA REDUCER 12-8 DVNC XI (CANNULA) ×1 IMPLANT
CELLS DAT CNTRL 66122 CELL SVR (MISCELLANEOUS) IMPLANT
CLIP LIGATING HEM O LOK PURPLE (MISCELLANEOUS) ×2 IMPLANT
CLIP LIGATING HEMOLOK MED (MISCELLANEOUS) IMPLANT
COVER TIP SHEARS 8 DVNC (MISCELLANEOUS) ×1 IMPLANT
COVER TIP SHEARS 8MM DA VINCI (MISCELLANEOUS) ×2
DECANTER SPIKE VIAL GLASS SM (MISCELLANEOUS) ×1 IMPLANT
DEVICE TROCAR PUNCTURE CLOSURE (ENDOMECHANICALS) IMPLANT
DRAIN CHANNEL 19F RND (DRAIN) ×2 IMPLANT
DRAPE ARM DVNC X/XI (DISPOSABLE) ×4 IMPLANT
DRAPE COLUMN DVNC XI (DISPOSABLE) ×1 IMPLANT
DRAPE DA VINCI XI ARM (DISPOSABLE) ×8
DRAPE DA VINCI XI COLUMN (DISPOSABLE) ×2
DRAPE SURG IRRIG POUCH 19X23 (DRAPES) ×3 IMPLANT
DRSG OPSITE POSTOP 4X10 (GAUZE/BANDAGES/DRESSINGS) IMPLANT
DRSG OPSITE POSTOP 4X6 (GAUZE/BANDAGES/DRESSINGS) IMPLANT
DRSG OPSITE POSTOP 4X8 (GAUZE/BANDAGES/DRESSINGS) ×2 IMPLANT
ELECT PENCIL ROCKER SW 15FT (MISCELLANEOUS) ×6 IMPLANT
ELECT REM PT RETURN 15FT ADLT (MISCELLANEOUS) ×3 IMPLANT
ENDOLOOP SUT PDS II  0 18 (SUTURE)
ENDOLOOP SUT PDS II 0 18 (SUTURE) IMPLANT
EVACUATOR DRAINAGE 10X20 100CC (DRAIN) IMPLANT
EVACUATOR SILICONE 100CC (DRAIN) ×3 IMPLANT
GAUZE SPONGE 4X4 12PLY STRL (GAUZE/BANDAGES/DRESSINGS) IMPLANT
GLOVE BIO SURGEON STRL SZ 6.5 (GLOVE) ×6 IMPLANT
GLOVE BIO SURGEONS STRL SZ 6.5 (GLOVE) ×3
GLOVE BIOGEL PI IND STRL 7.0 (GLOVE) ×3 IMPLANT
GLOVE BIOGEL PI INDICATOR 7.0 (GLOVE) ×6
GOWN STRL REUS W/TWL 2XL LVL3 (GOWN DISPOSABLE) ×9 IMPLANT
GOWN STRL REUS W/TWL XL LVL3 (GOWN DISPOSABLE) ×16 IMPLANT
HOLDER FOLEY CATH W/STRAP (MISCELLANEOUS) ×3 IMPLANT
LEGGING LITHOTOMY PAIR STRL (DRAPES) ×3 IMPLANT
LIQUID BAND (GAUZE/BANDAGES/DRESSINGS) ×2 IMPLANT
NDL INSUFFLATION 14GA 120MM (NEEDLE) ×1 IMPLANT
NEEDLE INSUFFLATION 14GA 120MM (NEEDLE) ×3 IMPLANT
PACK CARDIOVASCULAR III (CUSTOM PROCEDURE TRAY) ×3 IMPLANT
PACK COLON (CUSTOM PROCEDURE TRAY) ×3 IMPLANT
PACK GENERAL/GYN (CUSTOM PROCEDURE TRAY) ×3 IMPLANT
PORT LAP GEL ALEXIS MED 5-9CM (MISCELLANEOUS) ×2 IMPLANT
RELOAD STAPLE 45 BLU REG DVNC (STAPLE) IMPLANT
RELOAD STAPLE 45 GRN THCK DVNC (STAPLE) IMPLANT
RETRACTOR WND ALEXIS 18 MED (MISCELLANEOUS) IMPLANT
RTRCTR WOUND ALEXIS 18CM MED (MISCELLANEOUS)
SCISSORS LAP 5X45 EPIX DISP (ENDOMECHANICALS) ×3 IMPLANT
SEAL CANN UNIV 5-8 DVNC XI (MISCELLANEOUS) ×3 IMPLANT
SEAL XI 5MM-8MM UNIVERSAL (MISCELLANEOUS) ×6
SEALER VESSEL DA VINCI XI (MISCELLANEOUS) ×2
SEALER VESSEL EXT DVNC XI (MISCELLANEOUS) ×1 IMPLANT
SET IRRIG TUBING LAPAROSCOPIC (IRRIGATION / IRRIGATOR) ×3 IMPLANT
SLEEVE XCEL OPT CAN 5 100 (ENDOMECHANICALS) IMPLANT
SOLUTION ELECTROLUBE (MISCELLANEOUS) ×3 IMPLANT
SPONGE DRAIN TRACH 4X4 STRL 2S (GAUZE/BANDAGES/DRESSINGS) ×2 IMPLANT
STAPLER 45 BLU RELOAD XI (STAPLE) ×6 IMPLANT
STAPLER 45 BLUE RELOAD XI (STAPLE) ×12
STAPLER 45 GREEN RELOAD XI (STAPLE) ×2
STAPLER 45 GRN RELOAD XI (STAPLE) ×1 IMPLANT
STAPLER CANNULA SEAL DVNC XI (STAPLE) IMPLANT
STAPLER CANNULA SEAL XI (STAPLE)
STAPLER CIRC ILS CVD 33MM 37CM (STAPLE) ×2 IMPLANT
STAPLER SHEATH (SHEATH) ×2
STAPLER SHEATH ENDOWRIST DVNC (SHEATH) IMPLANT
STAPLER VISISTAT 35W (STAPLE) ×3 IMPLANT
SUCTION POOLE TIP (SUCTIONS) ×2 IMPLANT
SUT ETHILON 3 0 PS 1 (SUTURE) ×6 IMPLANT
SUT PDS AB 1 CTX 36 (SUTURE) ×4 IMPLANT
SUT PDS AB 1 TP1 96 (SUTURE) IMPLANT
SUT PROLENE 2 0 KS (SUTURE) ×3 IMPLANT
SUT SILK 2 0 (SUTURE) ×3
SUT SILK 2 0 SH CR/8 (SUTURE) ×3 IMPLANT
SUT SILK 2-0 18XBRD TIE 12 (SUTURE) ×1 IMPLANT
SUT SILK 3 0 (SUTURE) ×3
SUT SILK 3 0 SH CR/8 (SUTURE) ×3 IMPLANT
SUT SILK 3-0 18XBRD TIE 12 (SUTURE) ×1 IMPLANT
SUT V-LOC BARB 180 2/0GR6 GS22 (SUTURE) ×6
SUT VIC AB 2-0 SH 18 (SUTURE) ×2 IMPLANT
SUT VIC AB 2-0 SH 27 (SUTURE) ×3
SUT VIC AB 2-0 SH 27X BRD (SUTURE) IMPLANT
SUT VIC AB 4-0 PS2 27 (SUTURE) ×6 IMPLANT
SUTURE V-LC BRB 180 2/0GR6GS22 (SUTURE) IMPLANT
SYRINGE 10CC LL (SYRINGE) ×3 IMPLANT
SYS LAPSCP GELPORT 120MM (MISCELLANEOUS)
SYSTEM LAPSCP GELPORT 120MM (MISCELLANEOUS) IMPLANT
TAPE CLOTH SURG 4X10 WHT LF (GAUZE/BANDAGES/DRESSINGS) ×2 IMPLANT
TOWEL OR NON WOVEN STRL DISP B (DISPOSABLE) ×5 IMPLANT
TRAY FOLEY W/METER SILVER 14FR (SET/KITS/TRAYS/PACK) ×3 IMPLANT
TROCAR BLADELESS OPT 5 100 (ENDOMECHANICALS) ×3 IMPLANT
TUBING CONNECTING 10 (TUBING) IMPLANT
TUBING CONNECTING 10' (TUBING)
TUBING FILTER THERMOFLATOR (ELECTROSURGICAL) ×3 IMPLANT

## 2014-07-13 NOTE — Progress Notes (Signed)
PACU note----pt's heart rate 120 sinus tach; BP 150's/80's; Dr. Landry Dyke notified, order rec'd and med given

## 2014-07-13 NOTE — Op Note (Signed)
07/12/2014 - 07/13/2014  4:52 PM  PATIENT:  [name]  64 y.o. female  Patient Care Team: Eulas Post, MD as PCP - General Jacolyn Reedy, MD as Consulting Physician (Cardiology) Ladene Artist, MD as Consulting Physician (Gastroenterology) [name], MD as Consulting Physician (General Surgery) Kyung Rudd, MD as Consulting Physician (Radiation Oncology)  PRE-OPERATIVE DIAGNOSIS:  rectal cancer, ascedning colon polyp  POST-OPERATIVE DIAGNOSIS:  rectal cancer, ascedning colon polyp  PROCEDURE:   XI ROBOT ASSISTED PROXIMAL COLECTOMY AND LOW ANTERIOR RESECTION, WITH SPLENIC FLEXURE MOBILIZATION    Surgeon(s): Michael Boston, MD [name], MD  ASSISTANT: Johney Maine   ANESTHESIA:   general  EBL:  Total I/O In: 9242 [I.V.:4000; Blood:241] Out: 6834 [Urine:1250; Drains:265; Blood:100]  SPECIMEN:  Source of Specimen:  R colon, rectosigmoid, final distal margin   DRAINS: 61F blake drain in pelvis  DISPOSITION OF SPECIMEN:  PATHOLOGY  COUNTS:  YES  PLAN OF CARE: Admit to inpatient   PATIENT DISPOSITION:  PACU - hemodynamically stable.   INDICATIONS: 64 y.o. F with Rectal cancer, s/p neoadjuvant chemoradiation, Unresectable ascending colon polyp  OR FINDINGS:   No obvious metastatic disease on visceral parietal peritoneum or liver.  The anastomosis rests 9 cm from the anal verge by rigid proctoscopy.  DESCRIPTION:  The patient was identified & brought into the operating room. The patient was positioned supine with both arms tucked. SCDs were active during the entire case. The patient underwent general anesthesia without any difficulty. A foley catheter was inserted under sterile conditions. The abdomen was prepped and draped in a sterile fashion. A Surgical Timeout confirmed our plan.  I made a horizontal incision ~2 cm above the pubic bone, using a scalpel.  Dissection was carried down to the fascia using cautery.  The fascia was elevated with 2 Kocher clamps.   The muscle was separated from this and the peritoneum was entered and divided in a vertical fashion.  An [place] wound protector was placed. The 32mm robotic port was placed into the port.  We induced carbon dioxide insufflation. Camera inspection revealed no injury. I placed additional 70mm ports under direct laparoscopic visualization and a 91mm assist port in the RLQ.  I evaluated the entire abdomen laparoscopically.  The liver appeared normal, the large and small bowel were normal as well.  There were no signs of metastatic disease.   The robot was docked to the patient's left side and instruments were placed under direct visualization.  I began by identifying the ileocolic artery and vein within the mesentery. Dissection was bluntly carried around these structures. The duodenum was identified and free from the structures. I then separated the structures bluntly and used the robotic vessel sealer device to transect these separately.  I developed the retroperitoneal plane bluntly.  I then freed the appendix off its attachments to the pelvic wall. I mobilized the terminal ileum.  I took care to avoid injuring any retroperitoneal structures.  After this I began to mobilize laterally down the white line of Toldt and then took down the hepatic flexure using the vessel sealer device. I mobilized the omentum off of the right transverse colon. The entire colon was then flipped medially and mobilized off of the retroperitoneal structures until I could visualize the lateral edge of the duodenum underneath.  I gently freed the duodenal attachments.  I then stapled the terminal ileum and transverse colon using the robotic stapler.  I then positioned these in an isoperistaltic manner.  I made an intentional  enterotomy in the distal terminal ileum and in the transverse colon ~6cm from the stapled end.  I then used 2 fires of the blue load robotic stapler to create and anastomosis.  There was no bleeding noted.  I closed the  common enterotomy site with 2 running 2-0 V lock sutures.  Hemostasis was good.    We then undocked the the robot and placed the patient in steep trendelenburg with left side down.  We repositioned the robotic boom and docked again.  Instruments were placed under direct visualization.  I began by reflecting the greater omentum and the upper abdomen the small bowel in the upper abdomen. I scored the base of peritoneum of the right side of the mesentery of the left colon to the peritoneal reflection of the mid rectum.  I elevated the sigmoid mesentery and enetered into the retro-mesenteric plane. We were able to identify the left ureter and gonadal vessels. We kept those posterior within the retroperitoneum and elevated the left colon mesentery off that. I did isolated IMA pedicle but did not ligate it yet.  I continued distally and got into the avascular plane posterior to the mesorectum.  I mobilized posteriorly down to the level of the coccyx.  I attempted to identify the hypogastric nerves, but it was difficult due to the amount of scar tissue posteriorly.  I divided the lateral peritoneum and continued this to the peritoneal reflection anteriorly.  I dissected out the mesenteric planes laterally.  I mobilized the vaginal wall from the anterior rectum.  I performed a rigid proctoscopy and identified the scar in the rectum where the tumor used the be.  I was able to mark the distal margin this way.  I then skeletonized the mesorectum at this level and divided the distal rectum with a robotic stapler.    I skeletonized the inferior mesenteric artery pedicle.  I went down to its takeoff from the aorta.   I isolated the inferior mesenteric vein off of the ligament of Treitz just cephalad to that as well.  After confirming the left ureter was out of the way, I went ahead and ligated the inferior mesenteric artery pedicle with robotic vessel sealer ~2cm above its takeoff from the aorta.  I did ligate the sigmoid vein  in a similar fashion. We did have some bleeding from the transected IMA.  I placed a plastic wexner clip on this.  We ensured hemostasis elsewhere.  I mobilized the left colon in a lateral to medial fashion off the line of Toldt up towards the splenic flexure to ensure good mobilization of the left colon to reach into the pelvis.  I used firefly to identify an area of good perfusion, which appeared to be the junction of the descending colon and sigmoid.  I divided the colon and the mesentery at this level.  I then needed to mobilize the splenic flexure to get it to reach to the pelvis.  This was done using the vessel sealer to take the splenocolic ligament.  I mobilized the colon off the retroperitoneal structures bluntly.  The colon easily reached to the pelvis and to the rectal stump.    We undocked the robot and removed the specimens. I placed a 2-0 prolen suture in the purse-stringer device to created a purse string in the distal descending colon.  I secured this around a 22mm EEA anvil.  This was placed back into the pelvis and the cap was replaced on the alexis.  I inspected the  distal resected rectum on the back table.  The residual scar was 1 cm from the stapled margin.  We then performed an anastomosis without difficulty.  There was no leak when insufflated under water.  The anastomosis was ~9 cm from the anal verge.  There was some oozing at the staple line.  A 17F blake drain was placed in the pelvis and the abdomen was irrigated.  The drain was secured in the RLQ with a 2-0 Nylon suture.  We then switched to clean gowns, gloves, drapes and instruments.  The omentum was brought down to the pelvis.  The peritoneum of the extraction site was closed with a 2-0 Vicryl suture.  The fascia was closed with 2 running #1 PDS sutures.  THe wound was irrigated.  The subcutaneous tissues were re-approximated using 2-0 Vicryl sutures.  The skin was closed with a running 4-0 Vicryl suture and a sterile dressing was  applied.  The port sites were closed with 4-0 Vicryl sutures and dermabond.  The patient was then awakened from anesthesia and sent to the PACU in stable condition.  All counts were correct per OR staff.

## 2014-07-13 NOTE — Transfer of Care (Signed)
Immediate Anesthesia Transfer of Care Note  Patient: [name]  Procedure(s) Performed: Procedure(s): XI ROBOT ASSISTED LAPAROSCOPIC PROXIMAL COLON WITH LOW ANTERIOR RESECTION, WITH SPLENIC FLEXURE MOBILIZATION (N/A)  Patient Location: PACU  Anesthesia Type:General  Level of Consciousness: awake, alert  and oriented  Airway & Oxygen Therapy: Patient Spontanous Breathing and Patient connected to face mask oxygen  Post-op Assessment: Report given to RN and Post -op Vital signs reviewed and stable  Post vital signs: Reviewed and stable  Last Vitals:  Filed Vitals:   07/13/14 0735  BP: 137/63  Pulse: 74  Temp: 36.7 C  Resp: 17    Complications: No apparent anesthesia complications

## 2014-07-13 NOTE — Anesthesia Procedure Notes (Signed)
Procedure Name: Intubation Date/Time: 07/13/2014 8:45 AM Performed by: Maxwell Caul Pre-anesthesia Checklist: Patient identified, Emergency Drugs available, Suction available and Patient being monitored Patient Re-evaluated:Patient Re-evaluated prior to inductionOxygen Delivery Method: Circle System Utilized Preoxygenation: Pre-oxygenation with 100% oxygen Intubation Type: IV induction Ventilation: Mask ventilation without difficulty Laryngoscope Size: Mac and 4 Grade View: Grade I Tube type: Oral Tube size: 7.0 mm Number of attempts: 1 Airway Equipment and Method: Stylet and Oral airway Placement Confirmation: ETT inserted through vocal cords under direct vision,  positive ETCO2 and breath sounds checked- equal and bilateral Secured at: 21 cm Tube secured with: Tape Dental Injury: Teeth and Oropharynx as per pre-operative assessment

## 2014-07-13 NOTE — Anesthesia Postprocedure Evaluation (Signed)
  Anesthesia Post-op Note  Patient: [name]  Procedure(s) Performed: Procedure(s) (LRB): XI ROBOT ASSISTED LAPAROSCOPIC PROXIMAL COLON WITH LOW ANTERIOR RESECTION, WITH SPLENIC FLEXURE MOBILIZATION (N/A)  Patient Location: PACU  Anesthesia Type: General  Level of Consciousness: awake and alert   Airway and Oxygen Therapy: Patient Spontanous Breathing  Post-op Pain: mild  Post-op Assessment: Post-op Vital signs reviewed, Patient's Cardiovascular Status Stable, Respiratory Function Stable, Patent Airway and No signs of Nausea or vomiting  Last Vitals:  Filed Vitals:   07/13/14 1700  BP: 138/79  Pulse: 120  Temp:   Resp: 16    Post-op Vital Signs: stable   Complications: No apparent anesthesia complications

## 2014-07-13 NOTE — Interval H&P Note (Signed)
History and Physical Interval Note:  07/13/2014 8:18 AM  [name]  has presented today for surgery, with the diagnosis of rectal cancer, ascedning colon polyp  The various methods of treatment have been discussed with the patient and family. After consideration of risks, benefits and other options for treatment, the patient has consented to  Procedure(s): XI [place] (N/A) as a surgical intervention .  The patient's history has been reviewed, patient examined, no change in status, stable for surgery.  I have reviewed the patient's chart and labs.  Questions were answered to the patient's satisfaction.     Rosario Adie, MD  [facility]

## 2014-07-13 NOTE — Care Management Note (Signed)
Case Management Note  Patient Details  Name: [name] MRN: [id number] Date of Birth: [birth year]  Subjective/Objective: 64 y/o f admitted w/Colorectal Ca.  S/p colonoscopy, for lap resection & possible partial colectomy. From home.                 Action/Plan:d/c plan home. No anticipated d/c needs.   Expected Discharge Date:   (unknown)               Expected Discharge Plan:  Home/Self Care  In-House Referral:     Discharge planning Services  CM Consult  Post Acute Care Choice:    Choice offered to:     DME Arranged:    DME Agency:     HH Arranged:    HH Agency:     Status of Service:  In process, will continue to follow  Medicare Important Message Given:    Date Medicare IM Given:    Medicare IM give by:    Date Additional Medicare IM Given:    Additional Medicare Important Message give by:     If discussed at [place] of Stay Meetings, dates discussed:    Additional Comments:  Dessa Phi, RN 07/13/2014, 2:06 PM

## 2014-07-14 ENCOUNTER — Encounter (HOSPITAL_COMMUNITY): Payer: Self-pay | Admitting: General Surgery

## 2014-07-14 LAB — BASIC METABOLIC PANEL
Anion gap: 7 (ref 5–15)
BUN: 8 mg/dL (ref 6–20)
CALCIUM: 8.5 mg/dL — AB (ref 8.9–10.3)
CO2: 28 mmol/L (ref 22–32)
CREATININE: 0.62 mg/dL (ref 0.44–1.00)
Chloride: 103 mmol/L (ref 101–111)
GFR calc Af Amer: >60 mL/min (ref >60)
GFR calc non Af Amer: >60 mL/min (ref >60)
GLUCOSE: 166 mg/dL — AB (ref 65–99)
POTASSIUM: 3.8 mmol/L (ref 3.5–5.1)
Sodium: 138 mmol/L (ref 135–145)

## 2014-07-14 LAB — PREPARE PLATELET PHERESIS: Unit division: 0

## 2014-07-14 LAB — CBC
HCT: 28.7 % — ABNORMAL LOW (ref 36.0–46.0)
Hemoglobin: 9.8 g/dL — ABNORMAL LOW (ref 12.0–15.0)
MCH: 32.6 pg (ref 26.0–34.0)
MCHC: 34.1 g/dL (ref 30.0–36.0)
MCV: 95.3 fL (ref 78.0–100.0)
Platelets: 137 10*3/uL — ABNORMAL LOW (ref 150–400)
RBC: 3.01 MIL/uL — AB (ref 3.87–5.11)
RDW: 13.2 % (ref 11.5–15.5)
WBC: 6.7 10*3/uL (ref 4.0–10.5)

## 2014-07-14 LAB — GLUCOSE, CAPILLARY
GLUCOSE-CAPILLARY: 153 mg/dL — AB (ref 65–99)
GLUCOSE-CAPILLARY: 133 mg/dL — AB (ref 65–99)
GLUCOSE-CAPILLARY: 186 mg/dL — AB (ref 65–99)
Glucose-Capillary: 250 mg/dL — ABNORMAL HIGH (ref 65–99)
Glucose-Capillary: 133 mg/dL — ABNORMAL HIGH (ref 65–99)
Glucose-Capillary: 157 mg/dL — ABNORMAL HIGH (ref 65–99)
Glucose-Capillary: 174 mg/dL — ABNORMAL HIGH (ref 65–99)

## 2014-07-14 MED ORDER — INSULIN DETEMIR 100 UNIT/ML ~~LOC~~ SOLN
96.0000 [IU] | Freq: Every day | SUBCUTANEOUS | Status: DC
  Administered 2014-07-14: 96 [IU] via SUBCUTANEOUS
  Filled 2014-07-14: qty 0.96

## 2014-07-14 MED ORDER — KCL IN DEXTROSE-NACL 20-5-0.45 MEQ/L-%-% IV SOLN
INTRAVENOUS | Status: DC
  Administered 2014-07-14 – 2014-07-15 (×3): via INTRAVENOUS
  Filled 2014-07-14 (×3): qty 1000

## 2014-07-14 NOTE — Progress Notes (Signed)
1 Day Post-Op Robotic Assisted R Colectomy and LAR Subjective: Pt did well overnight, pain controlled.  No nausea  Objective: Vital signs in last 24 hours: Temp:  [97.4 F (36.3 C)-99.7 F (37.6 C)] 98.5 F (36.9 C) (06/09 0337) Pulse Rate:  [66-122] 66 (06/09 0600) Resp:  [11-20] 14 (06/09 0600) BP: (89-167)/(43-86) 118/44 mmHg (06/09 0600) SpO2:  [94 %-100 %] 99 % (06/09 0600) Weight:  [74 kg (163 lb 2.3 oz)-75.1 kg (165 lb 9.1 oz)] 75.1 kg (165 lb 9.1 oz) (06/09 0102)   Intake/Output from previous day: 06/08 0701 - 06/09 0700 In: 5951 [I.V.:5600; Blood:241] Out: 3160 [Urine:2625; Drains:435; Blood:100] Intake/Output this shift:     General appearance: alert and cooperative Cardio: regular rate and rhythm GI: normal findings: soft, non-tender and non-distended  Incision: no significant drainage  Lab Results:   Recent Labs  07/13/14 1535 07/14/14 0343  WBC 6.5 6.7  HGB 10.6* 9.8*  HCT 30.7* 28.7*  PLT 121* 137*   BMET  Recent Labs  07/13/14 1535 07/14/14 0343  NA 135 138  K 3.8 3.8  CL 103 103  CO2 23 28  GLUCOSE 286* 166*  BUN 8 8  CREATININE 0.57 0.62  CALCIUM 8.1* 8.5*   PT/INR No results for input(s): LABPROT, INR in the last 72 hours. ABG No results for input(s): PHART, HCO3 in the last 72 hours.  Invalid input(s): PCO2, PO2  MEDS, Scheduled . acetaminophen  1,000 mg Oral 4 times per day  . alvimopan  12 mg Oral BID  . antiseptic oral rinse  7 mL Mouth Rinse BID  . enoxaparin (LOVENOX) injection  40 mg Subcutaneous Q24H  . insulin aspart  0-20 Units Subcutaneous Q4H  . metoprolol tartrate  25 mg Oral BID  . sertraline  50 mg Oral QHS    Studies/Results: No results found.  Assessment: s/p Procedure(s): XI ROBOT ASSISTED LAPAROSCOPIC PROXIMAL COLON WITH LOW ANTERIOR RESECTION, WITH SPLENIC FLEXURE MOBILIZATION Patient Active Problem List   Diagnosis Date Noted  . Rectal cancer 03/31/2014  . Diabetic retinopathy 11/19/2012  . S/P  CABG (coronary artery bypass graft)   . Thrombocytopenia   . Obesity (BMI 30-39.9)   . History of depression   . CAD   .  Paroxysmal SVT (ANVRT)   . Type 2 diabetes mellitus with vascular disease   . Hyperlipidemia   . Hypertensive heart disease     Expected post op course  Plan: Advance diet to clears Transfer to med surg bed Minimize IVF's Ambulate Incentive spirometry Lovenox, SCD's for DVT prophylaxis  Chronic Thrombocytopenia: platelet count adequate after transfusion CAD: some sustained SVT noted post op, but no cardiac event suspected due to no EKG changes or troponin elevation.  Regular rate now   LOS: 2 days     .Rosario Adie, [facility], [place] [phone]   07/14/2014 7:41 AM

## 2014-07-14 NOTE — Evaluation (Signed)
Physical Therapy Evaluation Patient Details Name: [name] MRN: [id number] DOB: 10-11-1950 Today's Date: 07/14/2014   History of Present Illness  Pt is a 64 year old female s/p Robotic Assisted R Colectomy and LAR 6/8 due to rectal cancer  Clinical Impression  Pt admitted with above diagnosis. Pt currently with functional limitations due to the deficits listed below (see PT Problem List).  Pt will benefit from skilled PT to increase their independence and safety with mobility to allow discharge to the venue listed below.   Pt tolerated ambulating in hallway well today and anticipate progressing to no PT needs upon d/c.  Pt reports her daughter will be staying with her upon d/c to assist.     Follow Up Recommendations No PT follow up    Equipment Recommendations  None recommended by PT    Recommendations for Other Services       Precautions / Restrictions Precautions Precautions: Fall Precaution Comments: R anterior abdomen drain      Mobility  Bed Mobility               General bed mobility comments: pt up in recliner on arrival  Transfers Overall transfer level: Needs assistance Equipment used: Rolling walker (2 wheeled) Transfers: Sit to/from Stand Sit to Stand: Min assist         General transfer comment: difficulty with rise and controlling descent due to pain, verbal cues for using armrests to assist with controlling transfers  Ambulation/Gait Ambulation/Gait assistance: Min guard Ambulation Distance (Feet): 160 Feet Assistive device: Rolling walker (2 wheeled) Gait Pattern/deviations: Step-through pattern;Decreased stride length Gait velocity: decr   General Gait Details: a couple standing rest breaks, HR WNL and SpO2 93% room air during gait  Stairs            Wheelchair Mobility    Modified Rankin (Stroke Patients Only)       Balance                                             Pertinent Vitals/Pain Pain  Assessment: 0-10 Pain Score: 4  Pain Location: abdomen Pain Descriptors / Indicators: Sore Pain Intervention(s): Limited activity within patient's tolerance;Monitored during session    Home Living Family/patient expects to be discharged to:: Private residence Living Arrangements: Alone Available Help at Discharge: Family (daughter) Type of Home: House Home Access: Ramped entrance     Home Layout: One level Home Equipment: Environmental consultant - 2 wheels      Prior Function Level of Independence: Independent               Hand Dominance        Extremity/Trunk Assessment               Lower Extremity Assessment: Generalized weakness         Communication   Communication: No difficulties  Cognition Arousal/Alertness: Awake/alert Behavior During Therapy: WFL for tasks assessed/performed Overall Cognitive Status: Within Functional Limits for tasks assessed                      General Comments      Exercises        Assessment/Plan    PT Assessment Patient needs continued PT services  PT Diagnosis Difficulty walking;Acute pain   PT Problem List Decreased strength;Decreased activity tolerance;Decreased mobility;Pain  PT Treatment Interventions DME instruction;Gait training;Functional  mobility training;Patient/family education;Therapeutic exercise;Therapeutic activities   PT Goals (Current goals can be found in the Care Plan section) Acute Rehab PT Goals PT Goal Formulation: With patient Time For Goal Achievement: 07/21/14 Potential to Achieve Goals: Good    Frequency     Barriers to discharge        Co-evaluation               End of Session   Activity Tolerance: Patient tolerated treatment well Patient left: in chair;with call bell/phone within reach           Time: 2458-0998 PT Time Calculation (min) (ACUTE ONLY): 15 min   Charges:   PT Evaluation $Initial PT Evaluation Tier I: 1 Procedure     PT G Codes:         Kamerin Axford,KATHrine E 07/14/2014, 12:51 PM Carmelia Bake, PT, DPT 07/14/2014 Pager: [phone]

## 2014-07-15 LAB — BASIC METABOLIC PANEL
Anion gap: 6 (ref 5–15)
BUN: 7 mg/dL (ref 6–20)
CALCIUM: 8.2 mg/dL — AB (ref 8.9–10.3)
CO2: 28 mmol/L (ref 22–32)
Chloride: 105 mmol/L (ref 101–111)
Creatinine, Ser: 0.56 mg/dL (ref 0.44–1.00)
Glucose, Bld: 90 mg/dL (ref 65–99)
Potassium: 3.6 mmol/L (ref 3.5–5.1)
Sodium: 139 mmol/L (ref 135–145)

## 2014-07-15 LAB — CBC
HCT: 27.9 % — ABNORMAL LOW (ref 36.0–46.0)
Hemoglobin: 9.5 g/dL — ABNORMAL LOW (ref 12.0–15.0)
MCH: 33.6 pg (ref 26.0–34.0)
MCHC: 34.1 g/dL (ref 30.0–36.0)
MCV: 98.6 fL (ref 78.0–100.0)
Platelets: 120 10*3/uL — ABNORMAL LOW (ref 150–400)
RBC: 2.83 MIL/uL — ABNORMAL LOW (ref 3.87–5.11)
RDW: 13.4 % (ref 11.5–15.5)
WBC: 5.3 10*3/uL (ref 4.0–10.5)

## 2014-07-15 LAB — GLUCOSE, CAPILLARY
GLUCOSE-CAPILLARY: 83 mg/dL (ref 65–99)
GLUCOSE-CAPILLARY: 69 mg/dL (ref 65–99)
GLUCOSE-CAPILLARY: 78 mg/dL (ref 65–99)
GLUCOSE-CAPILLARY: 114 mg/dL — AB (ref 65–99)
GLUCOSE-CAPILLARY: 144 mg/dL — AB (ref 65–99)
Glucose-Capillary: 67 mg/dL (ref 65–99)
Glucose-Capillary: 96 mg/dL (ref 65–99)
Glucose-Capillary: 115 mg/dL — ABNORMAL HIGH (ref 65–99)

## 2014-07-15 MED ORDER — MORPHINE SULFATE 2 MG/ML IJ SOLN: 2.0000 mg | INTRAMUSCULAR | Status: DC | PRN

## 2014-07-15 MED ORDER — ZOLPIDEM TARTRATE 5 MG PO TABS
5.0000 mg | ORAL_TABLET | Freq: Every evening | ORAL | Status: DC | PRN
  Administered 2014-07-16: 5 mg via ORAL
  Filled 2014-07-15: qty 1

## 2014-07-15 MED ORDER — OXYCODONE-ACETAMINOPHEN 5-325 MG PO TABS
1.0000 | ORAL_TABLET | ORAL | Status: DC | PRN
  Administered 2014-07-15 (×2): 1 via ORAL
  Administered 2014-07-16: 2 via ORAL
  Filled 2014-07-15 (×2): qty 1
  Filled 2014-07-15: qty 2

## 2014-07-15 MED ORDER — IBUPROFEN 400 MG PO TABS
400.0000 mg | ORAL_TABLET | Freq: Four times a day (QID) | ORAL | Status: DC
  Administered 2014-07-15 – 2014-07-16 (×5): 400 mg via ORAL
  Filled 2014-07-15 (×9): qty 1

## 2014-07-15 MED ORDER — INSULIN DETEMIR 100 UNIT/ML ~~LOC~~ SOLN
80.0000 [IU] | Freq: Every day | SUBCUTANEOUS | Status: DC
  Administered 2014-07-15 – 2014-07-16 (×2): 80 [IU] via SUBCUTANEOUS
  Filled 2014-07-15 (×2): qty 0.8

## 2014-07-15 NOTE — Progress Notes (Signed)
Physical Therapy Treatment Patient Details Name: [name] MRN: [id number] DOB: 09/10/1950 Today's Date: 07/15/2014    History of Present Illness Pt is a 64 year old female s/p Robotic Assisted R Colectomy and LAR 6/8 due to rectal cancer    PT Comments    Assisted pt amb full unit then to BR.  No LOB and good alternating gait with shorter stride length and slight forward flex posture due to ABD sugery.  Assisted back to bed.  Pt reports she has been amb with nursing staff as well several times a day.    Follow Up Recommendations  No PT follow up     Equipment Recommendations  None recommended by PT    Recommendations for Other Services       Precautions / Restrictions Precautions Precautions: Fall Precaution Comments: R anterior abdomen drain Restrictions Weight Bearing Restrictions: No    Mobility  Bed Mobility Overal bed mobility: Modified Independent             General bed mobility comments: pt able to get self back to bed  Transfers Overall transfer level: Needs assistance Equipment used: Rolling walker (2 wheeled) Transfers: Sit to/from Stand Sit to Stand: Supervision         General transfer comment: good use of hands and good safety cognition  Ambulation/Gait Ambulation/Gait assistance: Min guard Ambulation Distance (Feet): 350 Feet Assistive device: Rolling walker (2 wheeled) Gait Pattern/deviations: Step-through pattern;Decreased stride length Gait velocity: WFL   General Gait Details: tolerated amb full init and also amb with nursing holding to IV pole   Stairs            Wheelchair Mobility    Modified Rankin (Stroke Patients Only)       Balance                                    Cognition Arousal/Alertness: Awake/alert Behavior During Therapy: WFL for tasks assessed/performed Overall Cognitive Status: Within Functional Limits for tasks assessed                      Exercises      General  Comments        Pertinent Vitals/Pain Pain Assessment: 0-10 Pain Score: 4  Pain Location: ABD Pain Descriptors / Indicators: Cramping;Sore Pain Intervention(s): Monitored during session    Home Living                      Prior Function            PT Goals (current goals can now be found in the care plan section) Progress towards PT goals: Progressing toward goals    Frequency       PT Plan      Co-evaluation             End of Session Equipment Utilized During Treatment: Gait belt Activity Tolerance: Patient tolerated treatment well Patient left: in bed;with call bell/phone within reach     Time: 1255-1308 PT Time Calculation (min) (ACUTE ONLY): 13 min  Charges:  $Gait Training: 8-22 mins                    G Codes:      Rica Koyanagi  PTA [facility]  Acute  Rehab Pager      [phone]

## 2014-07-15 NOTE — Progress Notes (Signed)
Hypoglycemic Event  CBG: 69  Treatment: 4 oz of apple juice  Symptoms: Asymptomatic  Follow-up CBG: Time: 2224  CBG Result:78  Possible Reasons for Event: Clear Liquid Diet  Comments/MD notified:Dr. Barbie Banner, Makila Colombe  Remember to initiate Hypoglycemia Order Set & complete

## 2014-07-15 NOTE — Progress Notes (Signed)
2 Days Post-Op Robotic Assisted R Colectomy and LAR Subjective: Pt having some abd cramping, denies nausea.  Tolerating clears.  Blood sugar a bit low this am after full dose insulin detemir last night  Objective: Vital signs in last 24 hours: Temp:  [98.2 F (36.8 C)-100.1 F (37.8 C)] 100.1 F (37.8 C) (06/10 0600) Pulse Rate:  [62-75] 71 (06/10 0600) Resp:  [15-18] 18 (06/10 0600) BP: (117-152)/(47-66) 121/54 mmHg (06/10 0600) SpO2:  [87 %-99 %] 92 % (06/10 0604) Weight:  [80.74 kg (178 lb)] 80.74 kg (178 lb) (06/09 2100)   Intake/Output from previous day: 06/09 0701 - 06/10 0700 In: 2940 [P.O.:480; I.V.:2430] Out: 2670 [Urine:2300; Drains:370] Intake/Output this shift:     General appearance: alert and cooperative Cardio: regular rate and rhythm GI: normal findings: soft, non-tender and non-distended  Incision: no significant drainage  Lab Results:   Recent Labs  07/14/14 0343 07/15/14 0537  WBC 6.7 5.3  HGB 9.8* 9.5*  HCT 28.7* 27.9*  PLT 137* 120*   BMET  Recent Labs  07/14/14 0343 07/15/14 0537  NA 138 139  K 3.8 3.6  CL 103 105  CO2 28 28  GLUCOSE 166* 90  BUN 8 7  CREATININE 0.62 0.56  CALCIUM 8.5* 8.2*   PT/INR No results for input(s): LABPROT, INR in the last 72 hours. ABG No results for input(s): PHART, HCO3 in the last 72 hours.  Invalid input(s): PCO2, PO2  MEDS, Scheduled . alvimopan  12 mg Oral BID  . antiseptic oral rinse  7 mL Mouth Rinse BID  . enoxaparin (LOVENOX) injection  40 mg Subcutaneous Q24H  . ibuprofen  400 mg Oral QID  . insulin aspart  0-20 Units Subcutaneous Q4H  . insulin detemir  80 Units Subcutaneous QHS  . metoprolol tartrate  25 mg Oral BID  . sertraline  50 mg Oral QHS    Studies/Results: No results found.  Assessment: s/p Procedure(s): XI ROBOT ASSISTED LAPAROSCOPIC PROXIMAL COLON WITH LOW ANTERIOR RESECTION, WITH SPLENIC FLEXURE MOBILIZATION Patient Active Problem List   Diagnosis Date Noted  .  Rectal cancer 03/31/2014  . Diabetic retinopathy 11/19/2012  . S/P CABG (coronary artery bypass graft)   . Thrombocytopenia   . Obesity (BMI 30-39.9)   . History of depression   . CAD   .  Paroxysmal SVT (ANVRT)   . Type 2 diabetes mellitus with vascular disease   . Hyperlipidemia   . Hypertensive heart disease     Expected post op course  Plan: Advance diet to full liquids and await return of bowel function Minimize IVF's Ambulate Incentive spirometry Lovenox, SCD's for DVT prophylaxis  Chronic Thrombocytopenia: platelet count adequate after transfusion, Hgb stabilizing  CAD: some sustained SVT noted post op, but no cardiac event suspected due to no EKG changes or troponin elevation.  Regular rate now Ambien added for sleep Diabetes: Detemir level decreased, cont to hold metformin, SSI for meals   LOS: 3 days     .Rosario Adie, [facility], [place]   07/15/2014 7:34 AM

## 2014-07-15 NOTE — Progress Notes (Signed)
Hypoglycemic Event  CBG:67  Treatment: 4 oz of apple juice  Symptoms: Asymptomatic  Follow-up CBG:96  Time:0430 CBG Result: 83 @0500   Possible Reasons for Event:  Diet: Clear liquid  Comments/MD notified:Dr Marcello Moores. A    Perotte-Pean, Vira Agar  Remember to initiate Hypoglycemia Order Set & complete

## 2014-07-16 LAB — BASIC METABOLIC PANEL
Anion gap: 5 (ref 5–15)
BUN: 5 mg/dL — AB (ref 6–20)
CALCIUM: 8.3 mg/dL — AB (ref 8.9–10.3)
CO2: 28 mmol/L (ref 22–32)
CREATININE: 0.31 mg/dL — AB (ref 0.44–1.00)
Chloride: 110 mmol/L (ref 101–111)
GFR calc Af Amer: >60 mL/min (ref >60)
GLUCOSE: 90 mg/dL (ref 65–99)
POTASSIUM: 3.6 mmol/L (ref 3.5–5.1)
Sodium: 143 mmol/L (ref 135–145)

## 2014-07-16 LAB — GLUCOSE, CAPILLARY
GLUCOSE-CAPILLARY: 108 mg/dL — AB (ref 65–99)
GLUCOSE-CAPILLARY: 74 mg/dL (ref 65–99)
GLUCOSE-CAPILLARY: 81 mg/dL (ref 65–99)
Glucose-Capillary: 107 mg/dL — ABNORMAL HIGH (ref 65–99)
Glucose-Capillary: 207 mg/dL — ABNORMAL HIGH (ref 65–99)
Glucose-Capillary: 122 mg/dL — ABNORMAL HIGH (ref 65–99)

## 2014-07-16 LAB — CBC
HCT: 27.3 % — ABNORMAL LOW (ref 36.0–46.0)
HEMOGLOBIN: 9.2 g/dL — AB (ref 12.0–15.0)
MCH: 32.7 pg (ref 26.0–34.0)
MCHC: 33.7 g/dL (ref 30.0–36.0)
MCV: 97.2 fL (ref 78.0–100.0)
PLATELETS: 100 10*3/uL — AB (ref 150–400)
RBC: 2.81 MIL/uL — ABNORMAL LOW (ref 3.87–5.11)
RDW: 13.3 % (ref 11.5–15.5)
WBC: 3.2 10*3/uL — AB (ref 4.0–10.5)

## 2014-07-16 MED ORDER — SODIUM CHLORIDE 0.9 % IJ SOLN
3.0000 mL | Freq: Two times a day (BID) | INTRAMUSCULAR | Status: DC
  Administered 2014-07-16 – 2014-07-17 (×3): 3 mL via INTRAVENOUS

## 2014-07-16 MED ORDER — LIP MEDEX EX OINT
1.0000 "application " | TOPICAL_OINTMENT | Freq: Two times a day (BID) | CUTANEOUS | Status: DC
  Administered 2014-07-16 – 2014-07-17 (×2): 1 via TOPICAL
  Filled 2014-07-16: qty 7

## 2014-07-16 MED ORDER — ALUM & MAG HYDROXIDE-SIMETH 200-200-20 MG/5ML PO SUSP: 30.0000 mL | Freq: Four times a day (QID) | ORAL | Status: DC | PRN

## 2014-07-16 MED ORDER — PROCHLORPERAZINE MALEATE 10 MG PO TABS: 10.0000 mg | ORAL_TABLET | Freq: Four times a day (QID) | ORAL | Status: DC | PRN

## 2014-07-16 MED ORDER — PROMETHAZINE HCL 25 MG/ML IJ SOLN: 6.2500 mg | INTRAMUSCULAR | Status: DC | PRN

## 2014-07-16 MED ORDER — IBUPROFEN 600 MG PO TABS
600.0000 mg | ORAL_TABLET | Freq: Four times a day (QID) | ORAL | Status: DC
  Administered 2014-07-16 – 2014-07-17 (×4): 600 mg via ORAL
  Filled 2014-07-16 (×6): qty 1
  Filled 2014-07-16: qty 3
  Filled 2014-07-16: qty 1

## 2014-07-16 MED ORDER — PHENOL 1.4 % MT LIQD
2.0000 | OROMUCOSAL | Status: DC | PRN
Start: 2014-07-16 — End: 2014-07-17

## 2014-07-16 MED ORDER — LACTATED RINGERS IV BOLUS (SEPSIS): 1000.0000 mL | Freq: Three times a day (TID) | INTRAVENOUS | Status: DC | PRN

## 2014-07-16 MED ORDER — DIPHENHYDRAMINE HCL 50 MG/ML IJ SOLN: 12.5000 mg | Freq: Four times a day (QID) | INTRAMUSCULAR | Status: DC | PRN

## 2014-07-16 MED ORDER — MENTHOL 3 MG MT LOZG: 1.0000 | LOZENGE | OROMUCOSAL | Status: DC | PRN

## 2014-07-16 MED ORDER — ASPIRIN EC 81 MG PO TBEC
81.0000 mg | DELAYED_RELEASE_TABLET | Freq: Every day | ORAL | Status: DC
  Administered 2014-07-16: 81 mg via ORAL
  Filled 2014-07-16 (×2): qty 1

## 2014-07-16 MED ORDER — METFORMIN HCL 500 MG PO TABS
1000.0000 mg | ORAL_TABLET | Freq: Two times a day (BID) | ORAL | Status: DC
  Administered 2014-07-16 – 2014-07-17 (×2): 1000 mg via ORAL
  Filled 2014-07-16 (×4): qty 2

## 2014-07-16 MED ORDER — SODIUM CHLORIDE 0.9 % IV SOLN: 250.0000 mL | INTRAVENOUS | Status: DC | PRN

## 2014-07-16 MED ORDER — SODIUM CHLORIDE 0.9 % IJ SOLN: 3.0000 mL | INTRAMUSCULAR | Status: DC | PRN

## 2014-07-16 MED ORDER — MAGIC MOUTHWASH
15.0000 mL | Freq: Four times a day (QID) | ORAL | Status: DC | PRN
  Filled 2014-07-16: qty 15

## 2014-07-16 MED ORDER — OXYCODONE HCL 5 MG PO TABS: 5.0000 mg | ORAL_TABLET | ORAL | Status: DC | PRN

## 2014-07-16 NOTE — Progress Notes (Signed)
[address] [place], [place] [address] Phone: [phone] FAX: [phone]   [name] [id number] 12-24-1950   Problem List:   Principal Problem:   Rectal cancer Active Problems:   Type 2 diabetes mellitus with vascular disease   History of depression   Hypertensive heart disease   Obesity (BMI 30-39.9)   3 Days Post-Op  07/13/2014  POST-OPERATIVE DIAGNOSIS: rectal cancer, ascedning colon polyp  PROCEDURE:  XI ROBOT ASSISTED PROXIMAL COLECTOMY AND LOW ANTERIOR RESECTION, WITH SPLENIC FLEXURE MOBILIZATION   Surgeon(s): Michael Boston, MD [name], MD  Assessment  Improving  Plan:  -adv solid diet -wean IVF -PO pain control -f/u pathology -DM control -HTN control -VTE prophylaxis- SCDs, etc -mobilize as tolerated to help recovery  D/C patient from hospital when patient meets criteria (anticipate in 1-2 day(s)):  Tolerating oral intake well Ambulating well Adequate pain control without IV medications Urinating  Having flatus Disposition planning in place   Adin Hector, M.D., F.A.C.S. Gastrointestinal and Minimally Invasive Surgery [facility] [address] [place], [place] [address] [phone] Main / Paging   07/16/2014  Subjective:  Feels better [name] well In good spirits Mild soreness only  Objective:  Vital signs:  Filed Vitals:   07/15/14 1331 07/15/14 2152 07/16/14 0550 07/16/14 0917  BP: 109/50 121/56 131/70 127/58  Pulse: 67 66 66 76  Temp: 98.5 F (36.9 C) 98.7 F (37.1 C) 98.2 F (36.8 C)   TempSrc: Oral Oral Oral   Resp: _0 Height:      Weight:      SpO2: 95% 95% 98%     Last BM Date: 07/15/14 (very small, per pt)  Intake/Output   Yesterday:  06/10 0701 - 06/11 0700 In: 2400 [P.O.:1200; I.V.:1200] Out: 3081 [Urine:2850; Drains:230; Stool:1] This shift:  Total I/O In: 120 [P.O.:120] Out: -    Bowel function:  Flatus: y  BM: y  Drain: serous  Physical Exam:  General: Pt awake/alert/oriented x4 in no acute distress Eyes: PERRL, normal EOM.  Sclera clear.  No icterus Neuro: CN II-XII intact w/o focal sensory/motor deficits. Lymph: No head/neck/groin lymphadenopathy Psych:  No delerium/psychosis/paranoia.  Smiling, chatty HENT: Normocephalic, Mucus membranes moist.  No thrush Neck: Supple, No tracheal deviation Chest: No chest wall pain w good excursion CV:  Pulses intact.  Regular rhythm MS: Normal AROM mjr joints.  No obvious deformity Abdomen: Soft.  Nondistended.  Mildly tender at incisions only.  No evidence of peritonitis.  No incarcerated hernias. Ext:  SCDs BLE.  No mjr edema.  No cyanosis Skin: No petechiae / purpura  Results:   Labs: Results for orders placed or performed during the hospital encounter of 07/12/14 (from the past 48 hour(s))  Glucose, capillary     Status: Abnormal   Collection Time: 07/14/14 12:28 PM  Result Value Ref Range   Glucose-Capillary 186 (H) 65 - 99 mg/dL  Glucose, capillary     Status: Abnormal   Collection Time: 07/14/14  4:02 PM  Result Value Ref Range   Glucose-Capillary 133 (H) 65 - 99 mg/dL  Glucose, capillary     Status: Abnormal   Collection Time: 07/14/14  9:05 PM  Result Value Ref Range   Glucose-Capillary 157 (H) 65 - 99 mg/dL  Glucose, capillary     Status: Abnormal   Collection Time: 07/14/14 11:29 PM  Result Value Ref Range   Glucose-Capillary 174 (H) 65 - 99  mg/dL  Glucose, capillary     Status: None   Collection Time: 07/15/14  4:15 AM  Result Value Ref Range   Glucose-Capillary 67 65 - 99 mg/dL  Glucose, capillary     Status: None   Collection Time: 07/15/14  4:37 AM  Result Value Ref Range   Glucose-Capillary 96 65 - 99 mg/dL  Glucose, capillary     Status: None   Collection Time: 07/15/14  5:02 AM  Result Value Ref Range   Glucose-Capillary 83 65 - 99 mg/dL  Basic metabolic panel     Status:  Abnormal   Collection Time: 07/15/14  5:37 AM  Result Value Ref Range   Sodium 139 135 - 145 mmol/L   Potassium 3.6 3.5 - 5.1 mmol/L   Chloride 105 101 - 111 mmol/L   CO2 28 22 - 32 mmol/L   Glucose, Bld 90 65 - 99 mg/dL   BUN 7 6 - 20 mg/dL   Creatinine, Ser 0.56 0.44 - 1.00 mg/dL   Calcium 8.2 (L) 8.9 - 10.3 mg/dL   GFR calc non Af Amer >60 >60 mL/min   GFR calc Af Amer >60 >60 mL/min    Comment: (NOTE) The eGFR has been calculated using the CKD EPI equation. This calculation has not been validated in all clinical situations. eGFR's persistently <60 mL/min signify possible Chronic Kidney Disease.    Anion gap 6 5 - 15  CBC     Status: Abnormal   Collection Time: 07/15/14  5:37 AM  Result Value Ref Range   WBC 5.3 4.0 - 10.5 K/uL   RBC 2.83 (L) 3.87 - 5.11 MIL/uL   Hemoglobin 9.5 (L) 12.0 - 15.0 g/dL   HCT 27.9 (L) 36.0 - 46.0 %   MCV 98.6 78.0 - 100.0 fL   MCH 33.6 26.0 - 34.0 pg   MCHC 34.1 30.0 - 36.0 g/dL   RDW 13.4 11.5 - 15.5 %   Platelets 120 (L) 150 - 400 K/uL  Glucose, capillary     Status: None   Collection Time: 07/15/14  7:41 AM  Result Value Ref Range   Glucose-Capillary 69 65 - 99 mg/dL  Glucose, capillary     Status: None   Collection Time: 07/15/14  7:58 AM  Result Value Ref Range   Glucose-Capillary 78 65 - 99 mg/dL  Glucose, capillary     Status: Abnormal   Collection Time: 07/15/14 11:30 AM  Result Value Ref Range   Glucose-Capillary 114 (H) 65 - 99 mg/dL  Glucose, capillary     Status: Abnormal   Collection Time: 07/15/14  3:39 PM  Result Value Ref Range   Glucose-Capillary 115 (H) 65 - 99 mg/dL  Glucose, capillary     Status: Abnormal   Collection Time: 07/15/14  7:50 PM  Result Value Ref Range   Glucose-Capillary 144 (H) 65 - 99 mg/dL  Glucose, capillary     Status: Abnormal   Collection Time: 07/16/14 12:01 AM  Result Value Ref Range   Glucose-Capillary 108 (H) 65 - 99 mg/dL  Glucose, capillary     Status: Abnormal   Collection Time:  07/16/14  3:51 AM  Result Value Ref Range   Glucose-Capillary 107 (H) 65 - 99 mg/dL  Basic metabolic panel     Status: Abnormal   Collection Time: 07/16/14  5:06 AM  Result Value Ref Range   Sodium 143 135 - 145 mmol/L   Potassium 3.6 3.5 - 5.1 mmol/L   Chloride 110 101 -  111 mmol/L   CO2 28 22 - 32 mmol/L   Glucose, Bld 90 65 - 99 mg/dL   BUN 5 (L) 6 - 20 mg/dL   Creatinine, Ser 0.31 (L) 0.44 - 1.00 mg/dL   Calcium 8.3 (L) 8.9 - 10.3 mg/dL   GFR calc non Af Amer >60 >60 mL/min   GFR calc Af Amer >60 >60 mL/min    Comment: (NOTE) The eGFR has been calculated using the CKD EPI equation. This calculation has not been validated in all clinical situations. eGFR's persistently <60 mL/min signify possible Chronic Kidney Disease.    Anion gap 5 5 - 15  CBC     Status: Abnormal   Collection Time: 07/16/14  5:06 AM  Result Value Ref Range   WBC 3.2 (L) 4.0 - 10.5 K/uL   RBC 2.81 (L) 3.87 - 5.11 MIL/uL   Hemoglobin 9.2 (L) 12.0 - 15.0 g/dL   HCT 27.3 (L) 36.0 - 46.0 %   MCV 97.2 78.0 - 100.0 fL   MCH 32.7 26.0 - 34.0 pg   MCHC 33.7 30.0 - 36.0 g/dL   RDW 13.3 11.5 - 15.5 %   Platelets 100 (L) 150 - 400 K/uL    Comment: REPEATED TO VERIFY SPECIMEN CHECKED FOR CLOTS PLATELET COUNT CONFIRMED BY SMEAR   Glucose, capillary     Status: None   Collection Time: 07/16/14  7:51 AM  Result Value Ref Range   Glucose-Capillary 74 65 - 99 mg/dL    Imaging / Studies: No results found.  Medications / Allergies: per chart  Antibiotics: Anti-infectives    Start     Dose/Rate Route Frequency Ordered Stop   07/13/14 2100  cefoTEtan (CEFOTAN) 2 g in dextrose 5 % 50 mL IVPB     2 g 100 mL/hr over 30 Minutes Intravenous Every 12 hours 07/13/14 1727 07/13/14 2055   07/13/14 0600  cefoTEtan (CEFOTAN) 2 g in dextrose 5 % 50 mL IVPB     2 g 100 mL/hr over 30 Minutes Intravenous On call to O.R. 07/12/14 1721 07/13/14 0900   07/12/14 1730  metroNIDAZOLE (FLAGYL) tablet 500 mg     500 mg Oral 4  times per day 07/12/14 1721 07/13/14 0527        Note: Portions of this report may have been transcribed using voice recognition software. Every effort was made to ensure accuracy; however, inadvertent computerized transcription errors may be present.   Any transcriptional errors that result from this process are unintentional.     Adin Hector, M.D., F.A.C.S. Gastrointestinal and Minimally Invasive Surgery [facility] [address] [place], [place] [address] [phone] Main / Paging   07/16/2014  CARE TEAM:  PCP: Eulas Post, MD  Outpatient Care Team: Patient Care Team: Eulas Post, MD as PCP - General Jacolyn Reedy, MD as Consulting Physician (Cardiology) Ladene Artist, MD as Consulting Physician (Gastroenterology) [name], MD as Consulting Physician (General Surgery) Kyung Rudd, MD as Consulting Physician (Radiation Oncology)  Inpatient Treatment Team: Treatment Team: Attending Provider: [name], MD; Technician: Tomie China, NT; Registered Nurse: Mortimer Fries, RN; Registered Nurse: Emeterio Reeve, RN

## 2014-07-17 LAB — GLUCOSE, CAPILLARY
GLUCOSE-CAPILLARY: 75 mg/dL (ref 65–99)
GLUCOSE-CAPILLARY: 95 mg/dL (ref 65–99)
Glucose-Capillary: 69 mg/dL (ref 65–99)
Glucose-Capillary: 70 mg/dL (ref 65–99)

## 2014-07-17 MED ORDER — OXYCODONE HCL 5 MG PO TABS: 5.0000 mg | ORAL_TABLET | Freq: Four times a day (QID) | ORAL | Status: DC | PRN

## 2014-07-17 NOTE — Discharge Summary (Signed)
Reviewed discharge teaching with pt and sister including incision care, follow-up appointments, precautions, and s/s of infection.  Answered all questions and pt able to return teaching.  Provided pt with dressing supplies for incision care.  Pt being d/c to home into care of her sister.

## 2014-07-17 NOTE — Discharge Instructions (Signed)
SURGERY: POST OP INSTRUCTIONS (Surgery for small bowel obstruction, colon resection, etc)   1. DIET: Follow a light bland diet the first 24 hours after arrival home, such as soup, liquids, crackers, etc.  Be sure to include lots of fluids daily.  Avoid fast food or heavy meals as your are more likely to get nauseated.  Stay on a low fat diet the next few days after surgery.  Gradually add a fiber supplement to your diet over the next week.   Your should try to eat a low-fat, high fiber diet the rest of your life thereafter (See Below).   2. Take your usually prescribed home medications unless otherwise directed.  OK to take aspirin.    If you are on strong blood thinners (warfarin/Coumadin, Plavix, Xerelto, Eliquis, etc), discuss with your surgeon, medicine PCP, and/or cardiologist for instructions on when to restart the blood thinner & if blood monitoring is needed (PT/INR blood check, etc)  3. PAIN CONTROL:  Pain after surgery or related to activity is often due to strain/injury to muscle, tendon, nerves and/or incisions.  This pain is usually short-term and will improve in a few months.   Many people find it helpful to do the following things TOGETHER to help speed the process of healing and to get back to regular activity more quickly:  1. Avoid heavy physical activity at first a. No lifting greater than 20 pounds at first, then increase to lifting as tolerated over the next few weeks b. Do not push through the pain.  Listen to your body and avoid positions and maneuvers than reproduce the pain.  Wait a few days before trying something more intense c. Walking is okay as tolerated, but go slowly and stop when getting sore.  If you can walk 30 minutes without stopping or pain, you can try more intense activity (running, jogging, aerobics, cycling, swimming, treadmill, sex, sports, weightlifting, etc ) d. Remember: If it hurts to do it, then dont do it!  2. Take Anti-inflammatory  medication a. Choose ONE of the following over-the-counter medications: i.            Acetaminophen 559m tabs (Tylenol) 1-2 pills with every meal and just before bedtime (avoid if you have liver problems) ii.            Naproxen 2222mtabs (ex. Aleve) 1-2 pills twice a day (avoid if you have kidney, stomach, IBD, or bleeding problems) iii. Ibuprofen 20068mabs (ex. Advil, Motrin) 3-4 pills with every meal and just before bedtime (avoid if you have kidney, stomach, IBD, or bleeding problems) b. Take with food/snack around the clock for 1-2 weeks i. This helps the muscle and nerve tissues become less irritable and calm down faster  3. Use a Heating pad or Ice/Cold Pack a. Most patients will experience some swelling and bruising around the incisions.  Swelling and bruising can take several weeks to resolve. i. Ice packs or heating pads (30-60 minutes up to 6 times a day) will help. ii. Use ice for the first few days to help decrease swelling and bruising iii. Switch to heat to help relax tight/sore spots and speed recovery.  iv. Some people prefer to use ice alone, heat alone, alternating between ice & heat.  Experiment to what works for you a. May use warm bath/hottub  or showers  4. Try Gentle Massage and/or Stretching  a. at the area of pain many times a day b. stop if you feel pain - do not  overdo it 5. Prescription for pain medication (such as oxycodone, hydrocodone, etc) should be given to you upon discharge.  Take your pain medication as prescribed. a. If you are having problems/concerns with the prescription medicine (does not control pain, nausea, vomiting, rash, itching, etc), please call us [phone] to see if we need to switch you to a different pain medicine that will work better for you and/or control your side effect better. b. If you need a refill on your pain medication, please contact your pharmacy.  They will contact our office to request authorization. Prescriptions will  not be filled after 5 pm or on week-ends. c.  Try these steps together to help you body heal faster and avoid making things get worse.  Doing just one of these things may not be enough.    If you are not getting better after two weeks or are noticing you are getting worse, contact our office for further advice; we may need to re-evaluate you & see what other things we can do to help.  i.  GETTING TO GOOD BOWEL HEALTH. Irregular bowel habits such as constipation and diarrhea can lead to many problems over time.  Having one soft bowel movement a day is the most important way to prevent further problems.  The anorectal canal is designed to handle stretching and feces to safely manage our ability to get rid of solid waste (feces, poop, stool) out of our body.  BUT, hard constipated stools can act like ripping concrete bricks and diarrhea can be a burning fire to this very sensitive area of our body, causing inflamed hemorrhoids, anal fissures, increasing risk is perirectal abscesses, abdominal pain/bloating, an making irritable bowel worse.      The goal: ONE SOFT BOWEL MOVEMENT A DAY!  To have soft, regular bowel movements:   Drink plenty of fluids, consider 4-6 tall glasses of water a day.    Take plenty of fiber.  Fiber is the undigested part of plant food that passes into the colon, acting s natures broom to encourage bowel motility and movement.  Fiber can absorb and hold large amounts of water. This results in a larger, bulkier stool, which is soft and easier to pass. Work gradually over several weeks up to 6 servings a day of fiber (25g a day even more if needed) in the form of: o Vegetables -- Root (potatoes, carrots, turnips), leafy green (lettuce, salad greens, celery, spinach), or cooked high residue (cabbage, broccoli, etc) o Fruit -- Fresh (unpeeled skin & pulp), Dried (prunes, apricots, cherries, etc ),  or stewed ( applesauce)  o Whole grain breads, pasta, etc (whole wheat)  o Bran  cereals   Bulking Agents -- This type of water-retaining fiber generally is easily obtained each day by one of the following:  o Psyllium bran -- The psyllium plant is remarkable because its ground seeds can retain so much water. This product is available as Metamucil, Konsyl, Effersyllium, Per Diem Fiber, or the less expensive generic preparation in drug and health food stores. Although labeled a laxative, it really is not a laxative.  o Methylcellulose -- This is another fiber derived from wood which also retains water. It is available as Citrucel. o Polyethylene Glycol - and artificial fiber commonly called Miralax or Glycolax.  It is helpful for people with gassy or bloated feelings with regular fiber o Flax Seed - a less gassy fiber than psyllium  No reading or other relaxing activity while on the toilet. If  bowel movements take longer than 5 minutes, you are too constipated  AVOID CONSTIPATION.  High fiber and water intake usually takes care of this.  Sometimes a laxative is needed to stimulate more frequent bowel movements, but   Laxatives are not a good long-term solution as it can wear the colon out.  They can help jump-start bowels if constipated, but should be relied on constantly without discussing with your doctor o Osmotics (Milk of Magnesia, Fleets phosphosoda, Magnesium citrate, MiraLax, GoLytely) are safer than  o Stimulants (Senokot, Castor Oil, Dulcolax, Ex Lax)    o Avoid taking laxatives for more than 7 days in a row.   IF SEVERELY CONSTIPATED, try a Bowel Retraining Program: o Do not use laxatives.  o Eat a diet high in roughage, such as bran cereals and leafy vegetables.  o Drink six (6) ounces of prune or apricot juice each morning.  o Eat two (2) large servings of stewed fruit each day.  o Take one (1) heaping tablespoon of a psyllium-based bulking agent twice a day. Use sugar-free sweetener when possible to avoid excessive calories.  o Eat a normal breakfast.   o Set aside 15 minutes after breakfast to sit on the toilet, but do not strain to have a bowel movement.  o If you do not have a bowel movement by the third day, use an enema and repeat the above steps.   Controlling diarrhea o Switch to liquids and simpler foods for a few days to avoid stressing your intestines further. o Avoid dairy products (especially milk & ice cream) for a short time.  The intestines often can lose the ability to digest lactose when stressed. o Avoid foods that cause gassiness or bloating.  Typical foods include beans and other legumes, cabbage, broccoli, and dairy foods.  Every person has some sensitivity to other foods, so listen to our body and avoid those foods that trigger problems for you. o Adding fiber (Citrucel, Metamucil, psyllium, Miralax) gradually can help thicken stools by absorbing excess fluid and retrain the intestines to act more normally.  Slowly increase the dose over a few weeks.  Too much fiber too soon can backfire and cause cramping & bloating. o Probiotics (such as active yogurt, Align, etc) may help repopulate the intestines and colon with normal bacteria and calm down a sensitive digestive tract.  Most studies show it to be of mild help, though, and such products can be costly. o Medicines: - Bismuth subsalicylate (ex. Kayopectate, Pepto Bismol) every 30 minutes for up to 6 doses can help control diarrhea.  Avoid if pregnant. - Loperamide (Immodium) can slow down diarrhea.  Start with two tablets (51m total) first and then try one tablet every 6 hours.  Avoid if you are having fevers or severe pain.  If you are not better or start feeling worse, stop all medicines and call your doctor for advice o Call your doctor if you are getting worse or not better.  Sometimes further testing (cultures, endoscopy, X-ray studies, bloodwork, etc) may be needed to help diagnose and treat the cause of the diarrhea.  TROUBLESHOOTING IRREGULAR BOWELS 1) Avoid extremes  of bowel movements (no bad constipation/diarrhea) 2) Miralax 17gm mixed in 8oz. water or juice-daily. May use BID as needed.  3) Gas-x,Phazyme, etc. as needed for gas & bloating.  4) Soft,bland diet. No spicy,greasy,fried foods.  5) Prilosec over-the-counter as needed  6) May hold gluten/wheat products from diet to see if symptoms improve.  7)  May  try probiotics (Align, Activa, etc) to help calm the bowels down 7) If symptoms become worse call back immediately.   4. Wash / shower every day.  You may shower over the incision / wound.  Avoid baths until 5 days after surgery.  Continue to shower over incision(s) after the dressing is off.  5. Remove your waterproof bandages 5 days after surgery.  You may leave the incision open to air.  Remove any wicks or ribbons in your wound.  If you have an open wound, please see wound care instructions. You may replace a dressing/Band-Aid to cover the incision for comfort if you wish.  6. ACTIVITIES as tolerated:   a. You may resume regular (light) daily activities beginning the next day--such as daily self-care, walking, climbing stairs--gradually increasing activities as tolerated.  If you can walk 30 minutes without difficulty, it is safe to try more intense activity such as jogging, treadmill, bicycling, low-impact aerobics, swimming, etc. b. Save the most intensive and strenuous activity for last (Usually 3-6 weeks after surgery) such as sit-ups, heavy lifting, contact sports, etc  Refrain from any heavy lifting or straining until you are off narcotics for pain control.   c. DO NOT PUSH THROUGH PAIN.  Let pain be your guide: If it hurts to do something, don't do it.  Pain is your body warning you to avoid that activity for another week until the pain goes down. d. You may drive when you are no longer taking prescription pain medication, you can comfortably wear a seatbelt, and you can safely maneuver your car and apply brakes. e. Dennis Bast may have sexual  intercourse when it is comfortable. If it hurts to do something, don't do it.  7. FOLLOW UP in our office a. Please call [facility] at [phone] to set up an appointment to see your surgeon in the office for a follow-up appointment approximately 2-3 weeks after your surgery. b. Make sure that you call for this appointment the day you arrive home to insure a convenient appointment time.  8. IF YOU HAVE DISABILITY OR FAMILY LEAVE FORMS, BRING THEM TO THE OFFICE FOR PROCESSING.  DO NOT GIVE THEM TO YOUR DOCTOR.   WHEN TO CALL us [phone]: 1. Poor pain control 2. Reactions / problems with new medications (rash/itching, nausea, etc)  3. Fever over 101.5 F (38.5 C) 4. Inability to urinate 5. Nausea and/or vomiting 6. Worsening swelling or bruising 7. Continued bleeding from incision. 8. Increased pain, redness, or drainage from the incision  The clinic staff is available to answer your questions during regular business hours (8:30am-5pm).  Please dont hesitate to call and ask to speak to one of our nurses for clinical concerns.   A surgeon from [facility] is always on call at the hospitals   If you have a medical emergency, go to the nearest emergency room or call 911.    [facility], [place], [place], [place], [place]  [address] ? MAIN: [phone] ? TOLL FREE: [phone] ? FAX [phone] [email or web address]  Colorectal Cancer Colorectal cancer is an abnormal growth of tissue (tumor) in the colon or rectum that is cancerous (malignant). Unlike noncancerous (benign) tumors, malignant tumors can spread to other parts of your body. The colon is the large bowel or large intestine. The rectum is the last several inches of the colon.  RISK FACTORS The exact cause of colorectal cancer is unknown. However, the following factors may increase your chances  of getting colorectal cancer:   Age older than 15 years.   Abnormal  growths (polyps) on the inner wall of the colon or rectum.   Diabetes.   African American race.   Family history of hereditary nonpolyposis colorectal cancer. This condition is caused by changes in the genes that are responsible for repairing mismatched DNA.   Personal history of cancer. A person who has already had colorectal cancer may develop it a second time. Also, women with a history of ovarian, uterine, or breast cancer are at a somewhat higher risk of developing colorectal cancer.  Certain hereditary conditions.  Eating a diet that is high in fat (especially animal fat) and low in fiber, fruits, and vegetables.  Sedentary lifestyle.  Inflammatory bowel disease, including ulcerative colitis and Crohn's disease.   Smoking.   Excessive alcohol use.  SYMPTOMS Early colorectal cancer often does not cause symptoms. As the cancer grows, symptoms may include:   Changes in bowel habits.  Diarrhea.   Constipation.   Feeling like the bowel does not empty completely after a bowel movement.   Blood in the stool.   Stools that are narrower than usual.   Abdominal discomfort, pain, bloating, fullness, or cramps.  Frequent gas pain.   Unexplained weight loss.   Constant tiredness.   Nausea and vomiting.  DIAGNOSIS  Your health care provider will ask about your medical history. He or she may also perform a number of procedures, such as:   A physical exam.  A digital rectal exam.  A fecal occult blood test.  A barium enema.  Blood tests.   X-rays.   Imaging tests, such as CT scans or MRIs.   Taking a tissue sample (biopsy) from your colon or rectum to look for cancer cells.   A sigmoidoscopy to view the inside of the last part of your colon.   A colonoscopy to view the inside of your entire colon.   An endorectal ultrasound to see how deep a rectal tumor has grown and whether the cancer has spread to lymph nodes or other nearby  tissues.  Your cancer will be staged to determine its severity and extent. Staging is a careful attempt to find out the size of the tumor, whether the cancer has spread, and if so, to what parts of the body. You may need to have more tests to determine the stage of your cancer. The test results will help determine what treatment plan is best for you.   Stage 0. The cancer is found only in the innermost lining of the colon or rectum.   Stage I. The cancer has grown into the inner wall of the colon or rectum. The cancer has not yet reached the outer wall of the colon.   Stage II. The cancer extends more deeply into or through the wall of the colon or rectum. It may have invaded nearby tissue, but cancer cells have not spread to the lymph nodes.   Stage III. The cancer has spread to nearby lymph nodes but not to other parts of the body.   Stage IV. The cancer has spread to other parts of the body, such as the liver or lungs.  Your health care provider may tell you the detailed stage of your cancer, which includes both a number and a letter.  TREATMENT  Depending on the type and stage, colorectal cancer may be treated with surgery, radiation therapy, chemotherapy, targeted therapy, or radiofrequency ablation. Some people have a combination of  these therapies. Surgery may be done to remove the polyps from your colon. In early stages, your health care provider may be able to do this during a colonoscopy. In later stages, surgery may be done to remove part of your colon.  HOME CARE INSTRUCTIONS   Take medicines only as directed by your health care provider.   Maintain a healthy diet.   Consider joining a support group. This may help you learn to cope with the stress of having colorectal cancer.   Seek advice to help you manage treatment of side effects.   Keep all follow-up visits as directed by your health care provider.   Inform your cancer specialist if you are admitted to the  hospital.  SEEK MEDICAL CARE IF:  Your diarrhea or constipation does not go away.   Your bowel habits change.  You have increased abdominal pain.   You notice new fatigue or weakness.  You lose weight. Document Released: 01/21/2005 Document Revised: 06/07/2013 Document Reviewed: 07/16/2012 [facility] Patient Information 2015 [place], [place]. This information is not intended to replace advice given to you by your health care provider. Make sure you discuss any questions you have with your health care provider.    Exercise to Stay Healthy Exercise helps you become and stay healthy. EXERCISE IDEAS AND TIPS Choose exercises that:  You enjoy.  Fit into your day. You do not need to exercise really hard to be healthy. You can do exercises at a slow or medium level and stay healthy. You can:  Stretch before and after working out.  Try yoga, Pilates, or tai chi.  Lift weights.  Walk fast, swim, jog, run, climb stairs, bicycle, dance, or rollerskate.  Take aerobic classes. Exercises that burn about 150 calories:  Running 1  miles in 15 minutes.  Playing volleyball for 45 to 60 minutes.  Washing and waxing a car for 45 to 60 minutes.  Playing touch football for 45 minutes.  Walking 1  miles in 35 minutes.  Pushing a stroller 1  miles in 30 minutes.  Playing basketball for 30 minutes.  Raking leaves for 30 minutes.  Bicycling 5 miles in 30 minutes.  Walking 2 miles in 30 minutes.  Dancing for 30 minutes.  Shoveling snow for 15 minutes.  Swimming laps for 20 minutes.  Walking up stairs for 15 minutes.  Bicycling 4 miles in 15 minutes.  Gardening for 30 to 45 minutes.  Jumping rope for 15 minutes.  Washing windows or floors for 45 to 60 minutes. Document Released: 02/23/2010 Document Revised: 04/15/2011 Document Reviewed: 02/23/2010 [facility] Patient Information 2015 [place], [place]. This information is not intended to replace advice given to you by your  health care provider. Make sure you discuss any questions you have with your health care provider.  Managing Pain  Pain after surgery or related to activity is often due to strain/injury to muscle, tendon, nerves and/or incisions.  This pain is usually short-term and will improve in a few months.   Many people find it helpful to do the following things TOGETHER to help speed the process of healing and to get back to regular activity more quickly:  6. Avoid heavy physical activity at first a. No lifting greater than 20 pounds at first, then increase to lifting as tolerated over the next few weeks b. Do not push through the pain.  Listen to your body and avoid positions and maneuvers than reproduce the pain.  Wait a few days before trying something more intense  c. Walking is okay as tolerated, but go slowly and stop when getting sore.  If you can walk 30 minutes without stopping or pain, you can try more intense activity (running, jogging, aerobics, cycling, swimming, treadmill, sex, sports, weightlifting, etc ) d. Remember: If it hurts to do it, then dont do it!  7. Take Anti-inflammatory medication a. Choose ONE of the following over-the-counter medications: i.            Acetaminophen 560m tabs (Tylenol) 1-2 pills with every meal and just before bedtime (avoid if you have liver problems) ii.            Naproxen 2258mtabs (ex. Aleve) 1-2 pills twice a day (avoid if you have kidney, stomach, IBD, or bleeding problems) iii. Ibuprofen 20037mabs (ex. Advil, Motrin) 3-4 pills with every meal and just before bedtime (avoid if you have kidney, stomach, IBD, or bleeding problems) b. Take with food/snack around the clock for 1-2 weeks i. This helps the muscle and nerve tissues become less irritable and calm down faster  8. Use a Heating pad or Ice/Cold Pack a. 4-6 times a day b. May use warm bath/hottub  or showers  9. Try Gentle Massage and/or Stretching  a. at the area of pain many times a  day b. stop if you feel pain - do not overdo it  Try these steps together to help you body heal faster and avoid making things get worse.  Doing just one of these things may not be enough.    If you are not getting better after two weeks or are noticing you are getting worse, contact our office for further advice; we may need to re-evaluate you & see what other things we can do to help.

## 2014-07-17 NOTE — Discharge Summary (Signed)
Physician Discharge Summary  Patient ID: [name] MRN: [id number] DOB/AGE: Feb 18, 1950 64 y.o.  Admit date: 07/12/2014 Discharge date: 07/17/2014  Patient Care Team: Eulas Post, MD as PCP - General Jacolyn Reedy, MD as Consulting Physician (Cardiology) Ladene Artist, MD as Consulting Physician (Gastroenterology) [name], MD as Consulting Physician (General Surgery) Kyung Rudd, MD as Consulting Physician (Radiation Oncology)  Admission Diagnoses: Principal Problem:   Rectal cancer Active Problems:   Type 2 diabetes mellitus with vascular disease   History of depression   Hypertensive heart disease   Obesity (BMI 30-39.9)   Discharge Diagnoses:  Principal Problem:   Rectal cancer Active Problems:   Type 2 diabetes mellitus with vascular disease   History of depression   Hypertensive heart disease   Obesity (BMI 30-39.9)  SURGERY 07/13/2014  POST-OPERATIVE DIAGNOSIS:  rectal cancer, ascedning colon polyp  SURGERY:  Procedure(s): XI ROBOT ASSISTED LAPAROSCOPIC PROXIMAL COLON WITH LOW ANTERIOR RESECTION, WITH SPLENIC FLEXURE MOBILIZATION  INDICATIONS: 64 y.o. F with Rectal cancer, s/p neoadjuvant chemoradiation, Unresectable ascending colon polyp  OR FINDINGS:   No obvious metastatic disease on visceral parietal peritoneum or liver.  The anastomosis rests 9 cm from the anal verge by rigid proctoscopy.  SURGEON:  Surgeon(s):  [name], MD Michael Boston, MD - Assist  Consults: None  Hospital Course:   The patient underwent the surgery above.  Postoperatively, the patient gradually mobilized and advanced to a solid diet.  Pain and other symptoms were treated aggressively.    By the time of discharge, the patient was walking well the hallways, eating solid food, having flatus.  BMs.  Pain was well-controlled on an oral medications.  Cleared by PT, needing walker minimally.  Based on meeting discharge criteria and continuing to recover, I felt it  was safe for the patient to be discharged from the hospital to further recover with close followup.  Pt's sister helping at home.   Postoperative recommendations were discussed in detail.  They are written as well.   Significant Diagnostic Studies:  Results for orders placed or performed during the hospital encounter of 07/12/14 (from the past 72 hour(s))  Glucose, capillary     Status: Abnormal   Collection Time: 07/14/14 12:28 PM  Result Value Ref Range   Glucose-Capillary 186 (H) 65 - 99 mg/dL  Glucose, capillary     Status: Abnormal   Collection Time: 07/14/14  4:02 PM  Result Value Ref Range   Glucose-Capillary 133 (H) 65 - 99 mg/dL  Glucose, capillary     Status: Abnormal   Collection Time: 07/14/14  9:05 PM  Result Value Ref Range   Glucose-Capillary 157 (H) 65 - 99 mg/dL  Glucose, capillary     Status: Abnormal   Collection Time: 07/14/14 11:29 PM  Result Value Ref Range   Glucose-Capillary 174 (H) 65 - 99 mg/dL  Glucose, capillary     Status: None   Collection Time: 07/15/14  4:15 AM  Result Value Ref Range   Glucose-Capillary 67 65 - 99 mg/dL  Glucose, capillary     Status: None   Collection Time: 07/15/14  4:37 AM  Result Value Ref Range   Glucose-Capillary 96 65 - 99 mg/dL  Glucose, capillary     Status: None   Collection Time: 07/15/14  5:02 AM  Result Value Ref Range   Glucose-Capillary 83 65 - 99 mg/dL  Basic metabolic panel     Status: Abnormal   Collection Time: 07/15/14  5:37 AM  Result  Value Ref Range   Sodium 139 135 - 145 mmol/L   Potassium 3.6 3.5 - 5.1 mmol/L   Chloride 105 101 - 111 mmol/L   CO2 28 22 - 32 mmol/L   Glucose, Bld 90 65 - 99 mg/dL   BUN 7 6 - 20 mg/dL   Creatinine, Ser 0.56 0.44 - 1.00 mg/dL   Calcium 8.2 (L) 8.9 - 10.3 mg/dL   GFR calc non Af Amer >60 >60 mL/min   GFR calc Af Amer >60 >60 mL/min    Comment: (NOTE) The eGFR has been calculated using the CKD EPI equation. This calculation has not been validated in all clinical  situations. eGFR's persistently <60 mL/min signify possible Chronic Kidney Disease.    Anion gap 6 5 - 15  CBC     Status: Abnormal   Collection Time: 07/15/14  5:37 AM  Result Value Ref Range   WBC 5.3 4.0 - 10.5 K/uL   RBC 2.83 (L) 3.87 - 5.11 MIL/uL   Hemoglobin 9.5 (L) 12.0 - 15.0 g/dL   HCT 27.9 (L) 36.0 - 46.0 %   MCV 98.6 78.0 - 100.0 fL   MCH 33.6 26.0 - 34.0 pg   MCHC 34.1 30.0 - 36.0 g/dL   RDW 13.4 11.5 - 15.5 %   Platelets 120 (L) 150 - 400 K/uL  Glucose, capillary     Status: None   Collection Time: 07/15/14  7:41 AM  Result Value Ref Range   Glucose-Capillary 69 65 - 99 mg/dL  Glucose, capillary     Status: None   Collection Time: 07/15/14  7:58 AM  Result Value Ref Range   Glucose-Capillary 78 65 - 99 mg/dL  Glucose, capillary     Status: Abnormal   Collection Time: 07/15/14 11:30 AM  Result Value Ref Range   Glucose-Capillary 114 (H) 65 - 99 mg/dL  Glucose, capillary     Status: Abnormal   Collection Time: 07/15/14  3:39 PM  Result Value Ref Range   Glucose-Capillary 115 (H) 65 - 99 mg/dL  Glucose, capillary     Status: Abnormal   Collection Time: 07/15/14  7:50 PM  Result Value Ref Range   Glucose-Capillary 144 (H) 65 - 99 mg/dL  Glucose, capillary     Status: Abnormal   Collection Time: 07/16/14 12:01 AM  Result Value Ref Range   Glucose-Capillary 108 (H) 65 - 99 mg/dL  Glucose, capillary     Status: Abnormal   Collection Time: 07/16/14  3:51 AM  Result Value Ref Range   Glucose-Capillary 107 (H) 65 - 99 mg/dL  Basic metabolic panel     Status: Abnormal   Collection Time: 07/16/14  5:06 AM  Result Value Ref Range   Sodium 143 135 - 145 mmol/L   Potassium 3.6 3.5 - 5.1 mmol/L   Chloride 110 101 - 111 mmol/L   CO2 28 22 - 32 mmol/L   Glucose, Bld 90 65 - 99 mg/dL   BUN 5 (L) 6 - 20 mg/dL   Creatinine, Ser 0.31 (L) 0.44 - 1.00 mg/dL   Calcium 8.3 (L) 8.9 - 10.3 mg/dL   GFR calc non Af Amer >60 >60 mL/min   GFR calc Af Amer >60 >60 mL/min     Comment: (NOTE) The eGFR has been calculated using the CKD EPI equation. This calculation has not been validated in all clinical situations. eGFR's persistently <60 mL/min signify possible Chronic Kidney Disease.    Anion gap 5 5 - 15  CBC  Status: Abnormal   Collection Time: 07/16/14  5:06 AM  Result Value Ref Range   WBC 3.2 (L) 4.0 - 10.5 K/uL   RBC 2.81 (L) 3.87 - 5.11 MIL/uL   Hemoglobin 9.2 (L) 12.0 - 15.0 g/dL   HCT 27.3 (L) 36.0 - 46.0 %   MCV 97.2 78.0 - 100.0 fL   MCH 32.7 26.0 - 34.0 pg   MCHC 33.7 30.0 - 36.0 g/dL   RDW 13.3 11.5 - 15.5 %   Platelets 100 (L) 150 - 400 K/uL    Comment: REPEATED TO VERIFY SPECIMEN CHECKED FOR CLOTS PLATELET COUNT CONFIRMED BY SMEAR   Glucose, capillary     Status: None   Collection Time: 07/16/14  7:51 AM  Result Value Ref Range   Glucose-Capillary 74 65 - 99 mg/dL  Glucose, capillary     Status: None   Collection Time: 07/16/14  1:14 PM  Result Value Ref Range   Glucose-Capillary 81 65 - 99 mg/dL  Glucose, capillary     Status: Abnormal   Collection Time: 07/16/14  4:07 PM  Result Value Ref Range   Glucose-Capillary 207 (H) 65 - 99 mg/dL  Glucose, capillary     Status: Abnormal   Collection Time: 07/16/14  8:05 PM  Result Value Ref Range   Glucose-Capillary 122 (H) 65 - 99 mg/dL  Glucose, capillary     Status: None   Collection Time: 07/17/14 12:04 AM  Result Value Ref Range   Glucose-Capillary 69 65 - 99 mg/dL  Glucose, capillary     Status: None   Collection Time: 07/17/14 12:29 AM  Result Value Ref Range   Glucose-Capillary 95 65 - 99 mg/dL  Glucose, capillary     Status: None   Collection Time: 07/17/14  4:09 AM  Result Value Ref Range   Glucose-Capillary 75 65 - 99 mg/dL  Glucose, capillary     Status: None   Collection Time: 07/17/14  7:14 AM  Result Value Ref Range   Glucose-Capillary 70 65 - 99 mg/dL    No results found.  Discharge Exam: Blood pressure 148/59, pulse 63, temperature 98.7 F (37.1 C),  temperature source Oral, resp. rate 18, height '5\' 2"'  (1.575 m), weight 80.74 kg (178 lb), SpO2 95 %.  General: Pt awake/alert/oriented x4 in no major acute distress.  Smiling Eyes: PERRL, normal EOM. Sclera nonicteric Neuro: CN II-XII intact w/o focal sensory/motor deficits. Lymph: No head/neck/groin lymphadenopathy Psych:  No delerium/psychosis/paranoia HENT: Normocephalic, Mucus membranes moist.  No thrush Neck: Supple, No tracheal deviation Chest: No pain.  Good respiratory excursion. CV:  Pulses intact.  Regular rhythm MS: Normal AROM mjr joints.  No obvious deformity Abdomen: Soft, Nondistended.  Incisions c/d/i.  Nontender.  No incarcerated hernias. Ext:  SCDs BLE.  No significant edema.  No cyanosis Skin: No petechiae / purpura  Discharged Condition: good   Past Medical History  Diagnosis Date  . Diabetes mellitus, insulin dependent (IDDM), uncontrolled   . Hyperlipidemia   . Hypertensive heart disease   .  Paroxysmal SVT (ANVRT)     S/p AV nodal ablation Dr. Lovena Le 09/28/08   . Depression   . CAD     Coronary disease status post non-ST elevation MI in May 2010 with  PCI of the left circumflex on Jun 24, 2008. She then had a PCI of  the RCA on July 15, 2008. 02/24/12 Cath, Severe 95% LAD, ostial 95% circ, ostial RCA, patent stents in distal RCA and mid circ normal LV  02/26/12 CABG with LIMA to LAD, SVG to RCA, and SVG to OM Dr. Roxan Hockey    . Cataract   . Cancer     adenocarcinoma of rectum  . S/P radiation therapy 04/11/14-05/18/14    50.4Gy rectal  . Heart murmur     Past Surgical History  Procedure Laterality Date  . Coronary angioplasty with stent placement    . Coronary artery bypass graft  02/26/2012    Roxan Hockey, MD;  Location: [facility];  Service: Open Heart Surgery;  Laterality: N/A;  CABG x three,  using left internal mammary artery  and left leg greater saphenous vein,   . Endovein harvest of greater saphenous vein  02/26/2012  . Left heart catheterization with  coronary angiogram N/A 02/24/2012    Procedure: LEFT HEART CATHETERIZATION WITH CORONARY ANGIOGRAM;  Surgeon: Jacolyn Reedy, MD;  Location: [facility] CATH LAB;  Service: Cardiovascular;  Laterality: N/A;  . Eus N/A 03/24/2014    Procedure: LOWER ENDOSCOPIC ULTRASOUND (EUS);  Surgeon: Milus Banister, MD;  Location: [facility];  Service: Endoscopy;  Laterality: N/A;  . Colonoscopy    . Colonoscopy with propofol N/A 07/12/2014    Procedure: COLONOSCOPY WITH PROPOFOL POSSIBLE POLYP RESECTION;  Surgeon: [name], MD;  Location: [facility];  Service: Endoscopy;  Laterality: N/A;   to be admitted after colonoscopy-robotic surgery on 6/8    History   Social History  . Marital Status: Divorced    Spouse Name: N/A  . Number of Children: N/A  . Years of Education: N/A   Occupational History  . Not on file.   Social History Main Topics  . Smoking status: Never Smoker   . Smokeless tobacco: Never Used  . Alcohol Use: No  . Drug Use: No  . Sexual Activity: No   Other Topics Concern  . 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   Also has a dog   Enjoys reading, gardening, going to movies or just getting out with her sister   [employer] to church and bible study group on  Tuesdays.   Independent and drives   Says she copes with stress by prayer     Family History  Problem Relation Age of Onset  . Colon cancer Neg Hx   . Diabetes Father   . Diabetes Brother   . Diabetes Sister   . Colon polyps Maternal Grandmother     Current Facility-Administered Medications  Medication Dose Route Frequency Provider Last Rate Last Dose  . 0.9 %  sodium chloride infusion  250 mL Intravenous PRN Michael Boston, MD      . alum & mag hydroxide-simeth (MAALOX/MYLANTA) 200-200-20 MG/5ML suspension 30 mL  30 mL Oral Q6H PRN  Michael Boston, MD      . antiseptic oral rinse (CPC / CETYLPYRIDINIUM CHLORIDE 0.05%) solution 7 mL  7 mL Mouth Rinse BID [name], MD   7 mL at 07/15/14 2150  . aspirin EC tablet 81 mg  81 mg Oral QHS Michael Boston, MD   81 mg at 07/16/14 2134  . diphenhydrAMINE (BENADRYL) 12.5 MG/5ML elixir 12.5 mg  12.5 mg Oral B5Z PRN [name], MD      . diphenhydrAMINE (BENADRYL) injection 12.5-25 mg  12.5-25 mg Intravenous Q6H PRN Michael Boston,  MD      . enoxaparin (LOVENOX) injection 40 mg  40 mg Subcutaneous L97Q [name], MD   40 mg at 07/16/14 0919  . ibuprofen (ADVIL,MOTRIN) tablet 600 mg  600 mg Oral QID Michael Boston, MD   600 mg at 07/16/14 2115  . insulin aspart (novoLOG) injection 0-20 Units  0-20 Units Subcutaneous B3A [name], MD   3 Units at 07/16/14 2054  . insulin detemir (LEVEMIR) injection 80 Units  80 Units Subcutaneous QHS [name], MD   80 Units at 07/16/14 2137  . lactated ringers bolus 1,000 mL  1,000 mL Intravenous Q8H PRN Michael Boston, MD      . lip balm (CARMEX) ointment 1 application  1 application Topical BID Michael Boston, MD   1 application at 19/37/90 1200  . magic mouthwash  15 mL Oral QID PRN Michael Boston, MD      . menthol-cetylpyridinium (CEPACOL) lozenge 3 mg  1 lozenge Oral PRN Michael Boston, MD      . metFORMIN (GLUCOPHAGE) tablet 1,000 mg  1,000 mg Oral BID WC Michael Boston, MD   1,000 mg at 07/16/14 1752  . metoprolol (LOPRESSOR) injection 5 mg  5 mg Intravenous W4O PRN [name], MD      . metoprolol tartrate (LOPRESSOR) tablet 25 mg  25 mg Oral BID [name], MD   25 mg at 07/16/14 2134  . morphine 2 MG/ML injection 2-4 mg  2-4 mg Intravenous X7D PRN [name], MD      . ondansetron [facility]) injection 4 mg  4 mg Intravenous Z3G PRN [name], MD      . oxyCODONE-acetaminophen (PERCOCET/ROXICET) 5-325 MG per tablet 1-2 tablet  1-2 tablet Oral D9M PRN [name], MD   2 tablet at 07/16/14 0532  . phenol (CHLORASEPTIC) mouth spray 2  spray  2 spray Mouth/Throat PRN Michael Boston, MD      . prochlorperazine (COMPAZINE) tablet 10 mg  10 mg Oral Q6H PRN Michael Boston, MD      . sertraline (ZOLOFT) tablet 50 mg  50 mg Oral QHS [name], MD   50 mg at 07/16/14 2135  . sodium chloride 0.9 % injection 3 mL  3 mL Intravenous Q12H Michael Boston, MD   3 mL at 07/16/14 2200  . sodium chloride 0.9 % injection 3 mL  3 mL Intravenous PRN Michael Boston, MD      . zolpidem Lorrin Mais) tablet 5 mg  5 mg Oral QHS PRN [name], MD   5 mg at 07/16/14 2140     No Known Allergies  Disposition: 01-Home or Self Care  Discharge Instructions    Call MD for:  extreme fatigue    Complete by:  As directed      Call MD for:  hives    Complete by:  As directed      Call MD for:  persistant nausea and vomiting    Complete by:  As directed      Call MD for:  redness, tenderness, or signs of infection (pain, swelling, redness, odor or green/yellow discharge around incision site)    Complete by:  As directed      Call MD for:  severe uncontrolled pain    Complete by:  As directed      Call MD for:    Complete by:  As directed   Temperature > 101.51F     Diet - low sodium heart healthy    Complete by:  As directed      Discharge instructions    Complete by:  As directed   Please see discharge instruction sheets.  Also refer to handout given an office.  Please call our office if you have any questions or concerns [phone]     Discharge wound care:    Complete by:  As directed   If you have closed incisions, shower and bathe over these incisions with soap and water every day.  Remove all surgical dressings on postoperative day #3.  You do not need to replace dressings over the closed incisions unless you feel more comfortable with a Band-Aid covering it.   Please call our office [phone] if you have further questions.     Driving Restrictions    Complete by:  As directed   No driving until off narcotics and can safely swerve away  without pain during an emergency     Increase activity slowly    Complete by:  As directed   Walk an hour a day.  Use 20-30 minute walks.  When you can walk 30 minutes without difficulty, increase to low impact/moderate activities such as biking, jogging, swimming, sexual activity..  Eventually can increase to unrestricted activity when not feeling pain.  If you feel pain: STOP!Marland Kitchen   Let pain protect you from overdoing it.  Use ice/heat/over-the-counter pain medications to help minimize his soreness.  Use pain prescriptions as needed to remain active.  It is better to take extra pain medications and be more active than to stay bedridden to avoid all pain medications.     Lifting restrictions    Complete by:  As directed   Avoid heavy lifting initially.  Do not push through pain.  You have no specific weight limit.  Coughing and sneezing or four more stressful to your incision than any lifting you will do. Pain will protect you from injury.  Therefore, avoid intense activity until off all narcotic pain medications.  Coughing and sneezing or four more stressful to your incision than any lifting he will do.     May shower / Bathe    Complete by:  As directed      May walk up steps    Complete by:  As directed      Sexual Activity Restrictions    Complete by:  As directed   Sexual activity as tolerated.  Do not push through pain.  Pain will protect you from injury.     Walk with assistance    Complete by:  As directed   Walk over an hour a day.  May use a walker/cane/companion to help with balance and stamina.            Medication List    TAKE these medications        aspirin 81 MG tablet  Take 81 mg by mouth at bedtime.     capecitabine 500 MG tablet  Commonly known as:  XELODA  Take 3 tablets (1,500 mg total) by mouth 2 (two) times daily after a meal.     Coral Calcium 1000 (390 CA) MG Tabs  Take 1,000 mg by mouth 2 (two) times daily.     fish oil-omega-3 fatty acids 1000 MG capsule   Take 2 g by mouth 2 (two) times daily.     furosemide 40 MG tablet  Commonly known as:  LASIX  Take 40 mg by mouth daily as needed for fluid or edema.     insulin aspart  100 UNIT/ML injection  Commonly known as:  novoLOG  Inject 10 units before each meal.     insulin detemir 100 UNIT/ML injection  Commonly known as:  LEVEMIR  Inject 0.96 mLs (96 Units total) into the skin at bedtime.     metFORMIN 1000 MG tablet  Commonly known as:  GLUCOPHAGE  TAKE ONE TABLET BY MOUTH TWICE DAILY WITH  A  MEAL     metoprolol tartrate 25 MG tablet  Commonly known as:  LOPRESSOR  Take 25 mg by mouth 2 (two) times daily.     oxyCODONE 5 MG immediate release tablet  Commonly known as:  Oxy IR/ROXICODONE  Take 1-2 tablets (5-10 mg total) by mouth every 6 (six) hours as needed for moderate pain, severe pain or breakthrough pain.     PRESCRIPTION MEDICATION  Place 1 each into the right eye. Every twelve weeks--injection.     prochlorperazine 10 MG tablet  Commonly known as:  COMPAZINE  Take 1 tablet (10 mg total) by mouth every 6 (six) hours as needed for nausea or vomiting.     sertraline 50 MG tablet  Commonly known as:  ZOLOFT  Take 1 tablet (50 mg total) by mouth at bedtime.           Follow-up Information    Follow up with Rosario Adie., MD. Schedule an appointment as soon as possible for a visit in 2 weeks.   Specialty:  General Surgery   Why:  To follow up after your operation, To follow up after your hospital stay   Contact information:   [place] [phone]        Signed: Morton Peters, M.D., F.A.C.S. Gastrointestinal and Minimally Invasive Surgery [facility] [address], [place], [place] [address] [phone] Main / Paging   07/17/2014, 9:05 AM

## 2014-07-18 ENCOUNTER — Telehealth: Payer: Self-pay | Admitting: Family Medicine

## 2014-07-18 NOTE — Telephone Encounter (Signed)
Pt said she had surgery for colon cancer and is not sleeping very well. She called to ask if Dr Elease Hashimoto will give her a rx for [place]; [facility]  [place]

## 2014-07-18 NOTE — Telephone Encounter (Signed)
ambien 10 mg po qhs prn insomnia.  #15 try to avoid long term use.

## 2014-07-19 ENCOUNTER — Telehealth: Payer: Self-pay | Admitting: General Surgery

## 2014-07-19 MED ORDER — ZOLPIDEM TARTRATE 10 MG PO TABS: 10.0000 mg | ORAL_TABLET | Freq: Every evening | ORAL | Status: DC | PRN

## 2014-07-19 NOTE — Telephone Encounter (Signed)
Discussed path results with pt

## 2014-07-19 NOTE — Addendum Note (Signed)
Addended by: Santiago Bumpers on: 07/19/2014 11:08 AM   Modules accepted: Orders

## 2014-07-19 NOTE — Telephone Encounter (Signed)
Phoned in.

## 2014-07-20 ENCOUNTER — Telehealth: Payer: Self-pay | Admitting: Hematology

## 2014-07-20 NOTE — Telephone Encounter (Signed)
per Burr Medico to call to r/s due to call day-cld & spoke to pt to adv of r/s time & date-pt understood

## 2014-07-20 NOTE — Telephone Encounter (Signed)
mailed copy of avs-updated

## 2014-08-01 ENCOUNTER — Other Ambulatory Visit: Payer: Self-pay

## 2014-08-04 ENCOUNTER — Telehealth: Payer: Self-pay | Admitting: Family Medicine

## 2014-08-04 NOTE — Telephone Encounter (Signed)
Ann from [facility] call to say that pt non compliance diet. Her blood sugar is consistently over 200

## 2014-08-05 DIAGNOSIS — E11349 Type 2 diabetes mellitus with severe nonproliferative diabetic retinopathy without macular edema: Secondary | ICD-10-CM | POA: Diagnosis not present

## 2014-08-05 DIAGNOSIS — H43813 Vitreous degeneration, bilateral: Secondary | ICD-10-CM | POA: Diagnosis not present

## 2014-08-05 DIAGNOSIS — E11341 Type 2 diabetes mellitus with severe nonproliferative diabetic retinopathy with macular edema: Secondary | ICD-10-CM | POA: Diagnosis not present

## 2014-08-05 DIAGNOSIS — H3582 Retinal ischemia: Secondary | ICD-10-CM | POA: Diagnosis not present

## 2014-08-05 NOTE — Telephone Encounter (Signed)
Left message for [name] to return call on 08/04/14 and 08/05/14.

## 2014-08-15 ENCOUNTER — Other Ambulatory Visit: Payer: Commercial Managed Care - HMO

## 2014-08-15 ENCOUNTER — Ambulatory Visit: Payer: Commercial Managed Care - HMO | Admitting: Hematology

## 2014-08-16 ENCOUNTER — Telehealth: Payer: Self-pay | Admitting: Family Medicine

## 2014-08-16 NOTE — Telephone Encounter (Signed)
Darlena from [facility] call to say they need a referral for the pt to  see Dr Truitt Merle for rectal Cancer

## 2014-08-17 ENCOUNTER — Other Ambulatory Visit: Payer: Self-pay | Admitting: Family Medicine

## 2014-08-17 DIAGNOSIS — C20 Malignant neoplasm of rectum: Secondary | ICD-10-CM

## 2014-08-17 NOTE — Telephone Encounter (Signed)
OK 

## 2014-08-17 NOTE — Telephone Encounter (Signed)
Referral is ordered

## 2014-08-19 ENCOUNTER — Encounter: Payer: Commercial Managed Care - HMO | Admitting: Hematology

## 2014-08-19 ENCOUNTER — Other Ambulatory Visit: Payer: Commercial Managed Care - HMO

## 2014-08-19 ENCOUNTER — Telehealth: Payer: Self-pay | Admitting: Family Medicine

## 2014-08-19 ENCOUNTER — Telehealth: Payer: Self-pay | Admitting: *Deleted

## 2014-08-19 NOTE — Telephone Encounter (Signed)
[name] from [facility] call to say pt is non compliance with her blood sugar being over 200 .  [name] said you and her had a conversation about this pt

## 2014-08-19 NOTE — Progress Notes (Signed)
Pt canceled her appointment   This encounter was created in error - please disregard.

## 2014-08-19 NOTE — Telephone Encounter (Signed)
Pt called to cancel appt with Dr Burr Medico. States "even if Dr Burr Medico finds anything and more needs to be done, I'm not going to do anymore chemo or radiation"

## 2014-08-19 NOTE — Telephone Encounter (Signed)
I called her and left a message: She had a complete pathological response to chemotherapy and radiation, so I think she'll be okay without additional adjuvant chemotherapy. I recommend her to be monitored for cancer recurrence, and I encouraged her to call back to reschedule her appointment for follow-up.  Truitt Merle  08/19/2014

## 2014-08-23 ENCOUNTER — Telehealth: Payer: Self-pay | Admitting: Family Medicine

## 2014-08-23 NOTE — Telephone Encounter (Signed)
These are essentially equivalent medications.  OK to switch. We frequently choose one based on availability of samples or insurance coverage.  We dont care which one she takes as long as she has coverage.

## 2014-08-23 NOTE — Telephone Encounter (Signed)
Pt would like to stop levimir and start on lantus. Pt would like to know why md put her on levimir. [facility] [employer]

## 2014-08-24 MED ORDER — INSULIN GLARGINE 100 UNIT/ML ~~LOC~~ SOLN: 96.0000 [IU] | Freq: Every day | SUBCUTANEOUS | Status: DC

## 2014-08-24 NOTE — Telephone Encounter (Signed)
Left message on VM. Rx for lantus should be ready for pickup today.

## 2014-09-02 DIAGNOSIS — E11341 Type 2 diabetes mellitus with severe nonproliferative diabetic retinopathy with macular edema: Secondary | ICD-10-CM | POA: Diagnosis not present

## 2014-09-16 DIAGNOSIS — H269 Unspecified cataract: Secondary | ICD-10-CM | POA: Diagnosis not present

## 2014-09-16 DIAGNOSIS — H2511 Age-related nuclear cataract, right eye: Secondary | ICD-10-CM | POA: Diagnosis not present

## 2014-09-16 DIAGNOSIS — H3581 Retinal edema: Secondary | ICD-10-CM | POA: Diagnosis not present

## 2014-09-16 DIAGNOSIS — H43811 Vitreous degeneration, right eye: Secondary | ICD-10-CM | POA: Diagnosis not present

## 2014-09-16 DIAGNOSIS — H25011 Cortical age-related cataract, right eye: Secondary | ICD-10-CM | POA: Diagnosis not present

## 2014-10-05 ENCOUNTER — Encounter: Payer: Self-pay | Admitting: Family Medicine

## 2014-10-05 ENCOUNTER — Ambulatory Visit (INDEPENDENT_AMBULATORY_CARE_PROVIDER_SITE_OTHER): Payer: Commercial Managed Care - HMO | Admitting: Family Medicine

## 2014-10-05 VITALS — BP 130/70 | HR 81 | Temp 97.5°F | Wt 158.0 lb

## 2014-10-05 DIAGNOSIS — E1151 Type 2 diabetes mellitus with diabetic peripheral angiopathy without gangrene: Secondary | ICD-10-CM

## 2014-10-05 DIAGNOSIS — E11649 Type 2 diabetes mellitus with hypoglycemia without coma: Secondary | ICD-10-CM | POA: Diagnosis not present

## 2014-10-05 DIAGNOSIS — E1159 Type 2 diabetes mellitus with other circulatory complications: Secondary | ICD-10-CM

## 2014-10-05 LAB — HEMOGLOBIN A1C: HEMOGLOBIN A1C: 5.7 % (ref 4.6–6.5)

## 2014-10-05 MED ORDER — SERTRALINE HCL 50 MG PO TABS: 50.0000 mg | ORAL_TABLET | Freq: Every day | ORAL | Status: DC

## 2014-10-05 NOTE — Patient Instructions (Signed)

## 2014-10-05 NOTE — Progress Notes (Signed)
Subjective:    Patient ID: [name], female    DOB: 1950/11/22, 64 y.o.   MRN: [id number]  HPI Patient here to discuss recent hypoglycemia. She had a low blood sugar 51. She's had a couple other readings in the 15s and 70s. She currently takes a regimen of Lantus 88 units once daily and have been taking NovoLog 10 units with each meal. She's had some pre-meal blood sugars less than 100 and has been holding her NovoLog occasionally. Fasting blood sugars consistently between 80 and 100. Last A1c 6.2%. She's been reasonably asymptomatic with recent hypoglycemia. No loss of consciousness. She remains on metformin.  She is also complaining of occasional bilateral hand tingling mostly at night and improves with change of position. Sometimes has tingling especially in the first through third digits of both hands. No cervical neck pain. No weakness.  Past Medical History  Diagnosis Date  . Diabetes mellitus, insulin dependent (IDDM), uncontrolled   . Hyperlipidemia   . Hypertensive heart disease   .  Paroxysmal SVT (ANVRT)     S/p AV nodal ablation Dr. Lovena Le 09/28/08   . Depression   . CAD     Coronary disease status post non-ST elevation MI in May 2010 with  PCI of the left circumflex on Jun 24, 2008. She then had a PCI of  the RCA on July 15, 2008. 02/24/12 Cath, Severe 95% LAD, ostial 95% circ, ostial RCA, patent stents in distal RCA and mid circ normal LV 02/26/12 CABG with LIMA to LAD, SVG to RCA, and SVG to OM Dr. Roxan Hockey    . Cataract   . Cancer     adenocarcinoma of rectum  . S/P radiation therapy 04/11/14-05/18/14    50.4Gy rectal  . Heart murmur    Past Surgical History  Procedure Laterality Date  . Coronary angioplasty with stent placement    . Coronary artery bypass graft  02/26/2012    Roxan Hockey, MD;  Location: [place];  Service: Open Heart Surgery;  Laterality: N/A;  CABG x three,  using left internal mammary artery  and left leg greater saphenous vein,   . Endovein harvest  of greater saphenous vein  02/26/2012  . Left heart catheterization with coronary angiogram N/A 02/24/2012    Procedure: LEFT HEART CATHETERIZATION WITH CORONARY ANGIOGRAM;  Surgeon: Jacolyn Reedy, MD;  Location: [facility] CATH LAB;  Service: Cardiovascular;  Laterality: N/A;  . Eus N/A 03/24/2014    Procedure: LOWER ENDOSCOPIC ULTRASOUND (EUS);  Surgeon: Milus Banister, MD;  Location: [facility];  Service: Endoscopy;  Laterality: N/A;  . Colonoscopy    . Colonoscopy with propofol N/A 07/12/2014    Procedure: COLONOSCOPY WITH PROPOFOL POSSIBLE POLYP RESECTION;  Surgeon: [name], MD;  Location: [facility] ENDOSCOPY;  Service: Endoscopy;  Laterality: N/A;   to be admitted after colonoscopy-robotic surgery on 6/8    reports that she has never smoked. She has never used smokeless tobacco. She reports that she does not drink alcohol or use illicit drugs. family history includes Colon polyps in her maternal grandmother; Diabetes in her brother, father, and sister. There is no history of Colon cancer. No Known Allergies    Review of Systems  Constitutional: Negative for fatigue.  Eyes: Negative for visual disturbance.  Respiratory: Negative for cough, chest tightness, shortness of breath and wheezing.   Cardiovascular: Negative for chest pain, palpitations and leg swelling.  Genitourinary: Negative for dysuria.  Neurological: Negative for dizziness, seizures, syncope, weakness, light-headedness and headaches.  Objective:   Physical Exam  Constitutional: She appears well-developed and well-nourished.  Cardiovascular: Normal rate and regular rhythm.   Pulmonary/Chest: Effort normal and breath sounds normal. No respiratory distress. She has no wheezes. She has no rales.  Musculoskeletal: She exhibits no edema.  Neurological:  Full strength upper extremities.          Assessment & Plan:  #1 recent hypoglycemia. She has already reduced her Lantus. We've recommended that she reduce her  NovoLog to 5 units with meals and hold altogether for preprandial blood sugars less than 100. Recheck A1c #2 paresthesias involving both hands. Distribution suggests probable carpal tunnel. Minimal pain. Consider night splints for both wrists. Symptoms are relatively mild and observe for now

## 2014-10-05 NOTE — Progress Notes (Signed)
Pre visit review using our clinic review tool, if applicable. No additional management support is needed unless otherwise documented below in the visit note. 

## 2014-10-07 ENCOUNTER — Other Ambulatory Visit: Payer: Self-pay | Admitting: Family Medicine

## 2014-10-13 DIAGNOSIS — H2511 Age-related nuclear cataract, right eye: Secondary | ICD-10-CM | POA: Diagnosis not present

## 2014-10-13 DIAGNOSIS — H25811 Combined forms of age-related cataract, right eye: Secondary | ICD-10-CM | POA: Diagnosis not present

## 2014-10-14 DIAGNOSIS — H2512 Age-related nuclear cataract, left eye: Secondary | ICD-10-CM | POA: Diagnosis not present

## 2014-11-03 DIAGNOSIS — H25812 Combined forms of age-related cataract, left eye: Secondary | ICD-10-CM | POA: Diagnosis not present

## 2014-11-03 DIAGNOSIS — H2512 Age-related nuclear cataract, left eye: Secondary | ICD-10-CM | POA: Diagnosis not present

## 2014-11-03 DIAGNOSIS — Z961 Presence of intraocular lens: Secondary | ICD-10-CM | POA: Diagnosis not present

## 2014-11-03 DIAGNOSIS — H2511 Age-related nuclear cataract, right eye: Secondary | ICD-10-CM | POA: Diagnosis not present

## 2014-11-24 DIAGNOSIS — C20 Malignant neoplasm of rectum: Secondary | ICD-10-CM | POA: Diagnosis not present

## 2014-11-24 DIAGNOSIS — R197 Diarrhea, unspecified: Secondary | ICD-10-CM | POA: Diagnosis not present

## 2014-11-24 DIAGNOSIS — K909 Intestinal malabsorption, unspecified: Secondary | ICD-10-CM | POA: Diagnosis not present

## 2014-11-25 ENCOUNTER — Other Ambulatory Visit: Payer: Self-pay | Admitting: *Deleted

## 2014-11-25 ENCOUNTER — Other Ambulatory Visit: Payer: Self-pay | Admitting: Family Medicine

## 2014-11-25 ENCOUNTER — Telehealth: Payer: Self-pay | Admitting: Hematology

## 2014-11-25 NOTE — Telephone Encounter (Signed)
lvm for pt to call back to r/s missed appt

## 2014-11-25 NOTE — Progress Notes (Signed)
Message from Dr. Marcello Moores that patient has agreed for appointment with Dr. Burr Medico for medical follow up of her cancer. POF to scheduler.

## 2014-12-02 ENCOUNTER — Ambulatory Visit: Payer: Commercial Managed Care - HMO | Admitting: Family Medicine

## 2014-12-07 ENCOUNTER — Telehealth: Payer: Self-pay | Admitting: *Deleted

## 2014-12-07 DIAGNOSIS — C20 Malignant neoplasm of rectum: Secondary | ICD-10-CM

## 2014-12-07 MED ORDER — LOPERAMIDE HCL 2 MG PO CAPS: 2.0000 mg | ORAL_CAPSULE | Freq: Every day | ORAL | Status: DC

## 2014-12-07 NOTE — Telephone Encounter (Signed)
  Oncology Nurse Navigator Documentation    Navigator Encounter Type: Telephone (12/07/14 1642): Noted Sruthi had seen Dr. Marcello Moores on 11/24/14. Suggested she follow up with Dr. Burr Medico. Called her home and left VM requesting return call to schedule follow up. Provided my direct contact information to ease the call in for her.  Dr. Marcello Moores had instructed her to take Imodium daily with dinner (increase to w/lunch also if needed).

## 2014-12-14 ENCOUNTER — Telehealth: Payer: Self-pay | Admitting: *Deleted

## 2014-12-14 NOTE — Telephone Encounter (Signed)
  Oncology Nurse Navigator Documentation    Navigator Encounter Type: Telephone (12/14/14 1439): Left VM requesting she call GI Nurse Navigator directly to discuss her medical oncology follow up needs and her goals/plans for follow up. Saw Dr. Marcello Moores 11/24/14 and due F/U there in Jan 2017.

## 2014-12-17 ENCOUNTER — Encounter (HOSPITAL_COMMUNITY): Payer: Self-pay | Admitting: Nurse Practitioner

## 2014-12-17 ENCOUNTER — Emergency Department (HOSPITAL_COMMUNITY)
Admission: EM | Admit: 2014-12-17 | Discharge: 2014-12-17 | Disposition: A | Discharge status: Home / Self Care | Payer: Commercial Managed Care - HMO | Attending: Emergency Medicine | Admitting: Emergency Medicine

## 2014-12-17 ENCOUNTER — Emergency Department (HOSPITAL_COMMUNITY): Payer: Commercial Managed Care - HMO

## 2014-12-17 DIAGNOSIS — Z9889 Other specified postprocedural states: Secondary | ICD-10-CM | POA: Insufficient documentation

## 2014-12-17 DIAGNOSIS — Z85048 Personal history of other malignant neoplasm of rectum, rectosigmoid junction, and anus: Secondary | ICD-10-CM | POA: Diagnosis not present

## 2014-12-17 DIAGNOSIS — Z9861 Coronary angioplasty status: Secondary | ICD-10-CM | POA: Diagnosis not present

## 2014-12-17 DIAGNOSIS — Y92002 Bathroom of unspecified non-institutional (private) residence single-family (private) house as the place of occurrence of the external cause: Secondary | ICD-10-CM | POA: Insufficient documentation

## 2014-12-17 DIAGNOSIS — Y9389 Activity, other specified: Secondary | ICD-10-CM | POA: Insufficient documentation

## 2014-12-17 DIAGNOSIS — F329 Major depressive disorder, single episode, unspecified: Secondary | ICD-10-CM | POA: Insufficient documentation

## 2014-12-17 DIAGNOSIS — Y998 Other external cause status: Secondary | ICD-10-CM | POA: Insufficient documentation

## 2014-12-17 DIAGNOSIS — Z7982 Long term (current) use of aspirin: Secondary | ICD-10-CM | POA: Insufficient documentation

## 2014-12-17 DIAGNOSIS — Z79899 Other long term (current) drug therapy: Secondary | ICD-10-CM | POA: Diagnosis not present

## 2014-12-17 DIAGNOSIS — Z951 Presence of aortocoronary bypass graft: Secondary | ICD-10-CM | POA: Diagnosis not present

## 2014-12-17 DIAGNOSIS — E119 Type 2 diabetes mellitus without complications: Secondary | ICD-10-CM | POA: Diagnosis not present

## 2014-12-17 DIAGNOSIS — I2510 Atherosclerotic heart disease of native coronary artery without angina pectoris: Secondary | ICD-10-CM | POA: Diagnosis not present

## 2014-12-17 DIAGNOSIS — S299XXA Unspecified injury of thorax, initial encounter: Secondary | ICD-10-CM | POA: Diagnosis present

## 2014-12-17 DIAGNOSIS — Z794 Long term (current) use of insulin: Secondary | ICD-10-CM | POA: Diagnosis not present

## 2014-12-17 DIAGNOSIS — R011 Cardiac murmur, unspecified: Secondary | ICD-10-CM | POA: Insufficient documentation

## 2014-12-17 DIAGNOSIS — W182XXA Fall in (into) shower or empty bathtub, initial encounter: Secondary | ICD-10-CM | POA: Insufficient documentation

## 2014-12-17 DIAGNOSIS — M940 Chondrocostal junction syndrome [Tietze]: Secondary | ICD-10-CM | POA: Diagnosis not present

## 2014-12-17 DIAGNOSIS — I119 Hypertensive heart disease without heart failure: Secondary | ICD-10-CM | POA: Diagnosis not present

## 2014-12-17 DIAGNOSIS — R0781 Pleurodynia: Secondary | ICD-10-CM | POA: Diagnosis not present

## 2014-12-17 MED ORDER — HYDROCODONE-ACETAMINOPHEN 5-325 MG PO TABS: 1.0000 | ORAL_TABLET | Freq: Four times a day (QID) | ORAL | Status: DC | PRN

## 2014-12-17 NOTE — Discharge Instructions (Signed)

## 2014-12-17 NOTE — ED Notes (Addendum)
Pt states she turned too quickly and fell onto R ribcage on side of bathtub Wednesday night. shes had right ribcage pain with palpation and breathing since. Denies any cp, sob, cough. Denies any injuries. No bruising. She states she didn't want to come but her son made her. She is alert and breathing easily

## 2014-12-17 NOTE — ED Provider Notes (Signed)
CSN: [id number]     Arrival date & time 12/17/14  1231 History   First MD Initiated Contact with Patient 12/17/14 1301     Chief Complaint  Patient presents with  . Chest Pain     (Consider location/radiation/quality/duration/timing/severity/associated sxs/prior Treatment) HPI   64 year old female with history of diabetes, paroxysmal SVT, hypertension presents for evaluation of chest injury. Patient report 4 days ago she fell in the bathtub and striking her right rib cage against the tub. This happened when she was turning too quickly and lost her balance. She described pain as sharp, nonradiating, worsening with palpation and with movement and improves with taking Aleve. She denies any precipitating symptoms prior to the injury. She denies hitting her head or loss of consciousness. Denies any headache, neck pain, back pain, abdominal pain, or seeing any bruising. She does complaining of having shortness of breath with ambulation when the pain is intense. No report of cough or hemoptysis. She is not on any anticoagulant. She is here at the urging of her son who lives in [place] and was concerned about her.  Past Medical History  Diagnosis Date  . Diabetes mellitus, insulin dependent (IDDM), uncontrolled ([place])   . Hyperlipidemia   . Hypertensive heart disease   .  Paroxysmal SVT (ANVRT)     S/p AV nodal ablation Dr. Lovena Le 09/28/08   . Depression   . CAD     Coronary disease status post non-ST elevation MI in May 2010 with  PCI of the left circumflex on Jun 24, 2008. She then had a PCI of  the RCA on July 15, 2008. 02/24/12 Cath, Severe 95% LAD, ostial 95% circ, ostial RCA, patent stents in distal RCA and mid circ normal LV 02/26/12 CABG with LIMA to LAD, SVG to RCA, and SVG to OM Dr. Roxan Hockey    . Cataract   . Cancer [facility])     adenocarcinoma of rectum  . S/P radiation therapy 04/11/14-05/18/14    50.4Gy rectal  . Heart murmur    Past Surgical History  Procedure Laterality Date  .  Coronary angioplasty with stent placement    . Coronary artery bypass graft  02/26/2012    Roxan Hockey, MD;  Location: [place];  Service: Open Heart Surgery;  Laterality: N/A;  CABG x three,  using left internal mammary artery  and left leg greater saphenous vein,   . Endovein harvest of greater saphenous vein  02/26/2012  . Left heart catheterization with coronary angiogram N/A 02/24/2012    Procedure: LEFT HEART CATHETERIZATION WITH CORONARY ANGIOGRAM;  Surgeon: Jacolyn Reedy, MD;  Location: [facility] CATH LAB;  Service: Cardiovascular;  Laterality: N/A;  . Eus N/A 03/24/2014    Procedure: LOWER ENDOSCOPIC ULTRASOUND (EUS);  Surgeon: Milus Banister, MD;  Location: [facility];  Service: Endoscopy;  Laterality: N/A;  . Colonoscopy    . Colonoscopy with propofol N/A 07/12/2014    Procedure: COLONOSCOPY WITH PROPOFOL POSSIBLE POLYP RESECTION;  Surgeon: [name], MD;  Location: [facility]OS[facility];  Service: Endoscopy;  Laterality: N/A;   to be admitted after colonoscopy-robotic surgery on 6/8   Family History  Problem Relation Age of Onset  . Colon cancer Neg Hx   . Diabetes Father   . Diabetes Brother   . Diabetes Sister   . Colon polyps Maternal Grandmother    Social History  Substance Use Topics  . Smoking status: Never Smoker   . Smokeless tobacco: Never Used  . Alcohol Use: No   OB History  No data available     Review of Systems  Constitutional: Negative for fever.  Respiratory: Positive for shortness of breath.   Cardiovascular: Positive for chest pain.  All other systems reviewed and are negative.     Allergies  Review of patient's allergies indicates no known allergies.  Home Medications   Prior to Admission medications   Medication Sig Start Date End Date Taking? Authorizing Provider  aspirin 81 MG tablet Take 81 mg by mouth at bedtime.     Historical Provider, MD  Coral Calcium 1000 (390 CA) MG TABS Take 1,000 mg by mouth 2 (two) times daily.    Historical Provider, MD   fish oil-omega-3 fatty acids 1000 MG capsule Take 2 g by mouth 2 (two) times daily.     Historical Provider, MD  furosemide (LASIX) 40 MG tablet Take 40 mg by mouth daily as needed for fluid or edema.    Historical Provider, MD  insulin glargine (LANTUS) 100 UNIT/ML injection Inject 0.96 mLs (96 Units total) into the skin at bedtime. Patient taking differently: Inject 88 Units into the skin at bedtime.  08/24/14   Eulas Post, MD  loperamide (IMODIUM) 2 MG capsule Take 1 capsule (2 mg total) by mouth daily. With dinner. Increase to bid if needed [id number]   [name], MD  metFORMIN (GLUCOPHAGE) 1000 MG tablet TAKE ONE TABLET BY MOUTH TWICE DAILY WITH A MEAL 10/07/14   Eulas Post, MD  metoprolol tartrate (LOPRESSOR) 25 MG tablet Take 25 mg by mouth 2 (two) times daily.    Historical Provider, MD  NOVOLOG 100 UNIT/ML injection INJECT 10 UNITS SUBCUTANEOUSLY BEFORE EACH MEAL 11/25/14   Eulas Post, MD  oxyCODONE (OXY IR/ROXICODONE) 5 MG immediate release tablet Take 1-2 tablets (5-10 mg total) by mouth every 6 (six) hours as needed for moderate pain, severe pain or breakthrough pain. 07/17/14   Michael Boston, MD  PRESCRIPTION MEDICATION Place 1 each into the right eye. Every twelve weeks--injection.    Historical Provider, MD  prochlorperazine (COMPAZINE) 10 MG tablet Take 1 tablet (10 mg total) by mouth every 6 (six) hours as needed for nausea or vomiting. 05/02/14   Maryanna Shape, NP  sertraline (ZOLOFT) 50 MG tablet Take 1 tablet (50 mg total) by mouth at bedtime. 10/05/14   Eulas Post, MD  zolpidem (AMBIEN) 10 MG tablet Take 1 tablet (10 mg total) by mouth at bedtime as needed for sleep. 07/19/14 08/18/14  Eulas Post, MD   BP 123/72 mmHg  Pulse 76  Temp(Src) 98.1 F (36.7 C) (Oral)  Resp 16  Ht 5\' 2"  (1.575 m)  Wt 153 lb (69.4 kg)  BMI 27.98 kg/m2  SpO2 96% Physical Exam  Constitutional: She appears well-developed and well-nourished. No distress.  HENT:   Head: Atraumatic.  Eyes: Conjunctivae are normal.  Neck: Neck supple.  Cardiovascular: Normal rate and regular rhythm.   Pulmonary/Chest: Effort normal and breath sounds normal. She exhibits tenderness (tenderness along right lateral ribs at T8-T10. No crepitus or emphysema. No  ecchymosis.).  Abdominal: Soft. There is no tenderness.  Musculoskeletal:  No significant midline spine tenderness crepitus or step-off.  Neurological: She is alert.  Skin: No rash noted.  Psychiatric: She has a normal mood and affect.  Nursing note and vitals reviewed.   ED Course  Procedures (including critical care time)   Patient had a mechanical fall injuring her right lateral ribs 4 days ago. X-ray ordered. Doubt ACS or PE. Pt in  no acute discomfort, no hypoxia.  She is here at the encouragement of her son.  She does not think she has any broken ribs.  2:15 PM Xray neg for obvious rib fx or other acute finding.  Doubt PTX.  Low suspicion for abdominal injury.  Pt stable for discharge.    Labs Review Labs Reviewed - No data to display  Imaging Review Dg Ribs Unilateral W/chest Right  12/17/2014  CLINICAL DATA:  Fall last week with right rib pain. Shortness of breath with exertion. Initial encounter. EXAM: RIGHT RIBS AND CHEST - 3+ VIEW COMPARISON:  Chest x-ray on 03/31/2012 FINDINGS: No acute rib fracture is identified. No evidence of pneumothorax or pleural effusion. Lung volumes are relatively low. No focal airspace consolidation. The heart size is stable and normal status post prior CABG. The aorta is mildly ectatic. IMPRESSION: No evidence of right rib fracture or other acute findings. Electronically Signed   By: Aletta Edouard M.D.   On: 12/17/2014 14:03   I have personally reviewed and evaluated these images and lab results as part of my medical decision-making.   EKG Interpretation None      MDM   Final diagnoses:  Costochondritis    BP 123/72 mmHg  Pulse 76  Temp(Src) 98.1 F  (36.7 C) (Oral)  Resp 16  Ht 5\' 2"  (1.575 m)  Wt 153 lb (69.4 kg)  BMI 27.98 kg/m2  SpO2 96%     Domenic Moras, PA-C 12/17/14 [place], MD 12/19/14 2241

## 2014-12-21 ENCOUNTER — Encounter: Payer: Self-pay | Admitting: *Deleted

## 2014-12-21 NOTE — Progress Notes (Signed)
Attempted to reach patient on her home phone # to attempt reschedule of appointment with Dr. Burr Medico. Her phone number has been disconnected. Will send Mychart message

## 2015-01-05 ENCOUNTER — Telehealth: Payer: Self-pay | Admitting: Family Medicine

## 2015-01-05 NOTE — Telephone Encounter (Signed)
insulin glargine (LANTUS) 100 UNIT/ML injection metFORMIN (GLUCOPHAGE) 1000 MG tabletsertraline (ZOLOFT) 50 MG tablet  Pt want  All her medications sent to [facility] in [place].

## 2015-01-06 ENCOUNTER — Other Ambulatory Visit: Payer: Self-pay | Admitting: Family Medicine

## 2015-01-06 MED ORDER — ZOLPIDEM TARTRATE 10 MG PO TABS: 10.0000 mg | ORAL_TABLET | Freq: Every evening | ORAL | Status: DC | PRN

## 2015-01-06 MED ORDER — INSULIN ASPART 100 UNIT/ML ~~LOC~~ SOLN: 10.0000 [IU] | Freq: Three times a day (TID) | SUBCUTANEOUS | Status: DC

## 2015-01-06 MED ORDER — METFORMIN HCL 1000 MG PO TABS: 1000.0000 mg | ORAL_TABLET | Freq: Two times a day (BID) | ORAL | Status: DC

## 2015-01-06 MED ORDER — SERTRALINE HCL 50 MG PO TABS: 50.0000 mg | ORAL_TABLET | Freq: Every day | ORAL | Status: DC

## 2015-01-06 MED ORDER — FUROSEMIDE 40 MG PO TABS: 40.0000 mg | ORAL_TABLET | Freq: Every day | ORAL | Status: DC | PRN

## 2015-01-06 MED ORDER — PROCHLORPERAZINE MALEATE 10 MG PO TABS: 10.0000 mg | ORAL_TABLET | Freq: Four times a day (QID) | ORAL | Status: DC | PRN

## 2015-01-06 MED ORDER — INSULIN GLARGINE 100 UNIT/ML ~~LOC~~ SOLN: 88.0000 [IU] | Freq: Every day | SUBCUTANEOUS | Status: DC

## 2015-01-06 MED ORDER — METOPROLOL TARTRATE 25 MG PO TABS: 25.0000 mg | ORAL_TABLET | Freq: Two times a day (BID) | ORAL | Status: DC

## 2015-01-06 NOTE — Telephone Encounter (Signed)
Medications sent in to pharmacy.

## 2015-02-17 ENCOUNTER — Telehealth: Payer: Self-pay | Admitting: Hematology

## 2015-02-17 NOTE — Telephone Encounter (Signed)
Patient called to r/s cxd appointments from July 2016. Gave patient new appointments for 1/20 @ 12:45 pm.

## 2015-02-21 ENCOUNTER — Encounter: Payer: Self-pay | Admitting: Cardiology

## 2015-02-21 DIAGNOSIS — E663 Overweight: Secondary | ICD-10-CM | POA: Diagnosis not present

## 2015-02-21 DIAGNOSIS — E785 Hyperlipidemia, unspecified: Secondary | ICD-10-CM | POA: Diagnosis not present

## 2015-02-21 DIAGNOSIS — I10 Essential (primary) hypertension: Secondary | ICD-10-CM | POA: Diagnosis not present

## 2015-02-21 DIAGNOSIS — I2510 Atherosclerotic heart disease of native coronary artery without angina pectoris: Secondary | ICD-10-CM | POA: Diagnosis not present

## 2015-02-21 NOTE — Progress Notes (Signed)
Patient ID: [name], female   DOB: 1950-02-15, 65 y.o.   MRN: [id number]   [name]    Date of visit:  02/21/2015 DOB:  Feb 12, 1950    Age:  65 yrs. Medical record number:  [id number]     Account number:  [id number] Primary Care Provider: Carolann Hart ____________________________ CURRENT DIAGNOSES  1. CAD Native without angina  2. Hyperlipidemia  3. Essential (primary) hypertension  4. Type 2 diabetes mellitus with proliferative diabetic retinopathy without macular edema  5. Overweight  6. Presence of aortocoronary bypass graft  7. Personal History Of Malignant Neoplasm Of Rectum, Rectosigmoid Junction, And Anus ____________________________ ALLERGIES  No Known Drug Allergies ____________________________ MEDICATIONS  1. Fish Oil 1,000 mg capsule, 2 bid  2. Zoloft 50 mg tablet, QHS  3. metformin 1,000 mg tablet, BID  4. aspirin 81 mg tablet,chewable, 1 p.o. daily  5. metoprolol tartrate 25 mg tablet, BID  6. Novolog 100 unit/mL subcutaneous solution, Take as directed  7. Levemir 100 unit/mL subcutaneous solution, 90 u qhs  8. atorvastatin 40 mg tablet, 1 p.o. daily ____________________________ HISTORY OF PRESENT ILLNESS Patient seen for cardiac followup. She is now retired from her work and living with her son near [place]. She developed rectal cancer this year was treated with radiation and chemotherapy followed by surgery. She did fairly well with this. This year she stopped taking her port a statin because of things that she had read about it. She has not had lipid testing done in over a year. She denies angina and has no PND, orthopnea, syncope, or claudication. ____________________________ PAST HISTORY  Past Medical Illnesses:  hypertension, DM-insulin dependent, hyperlipidemia, obesity, rectal cancer treated with radiation, chemo and surgery 2016;  Cardiovascular Illnesses:  CAD, arrhythmia-PSVT, atrial fibrillation;  Surgical Procedures:  CABG;  NYHA Classification:   I;  Canadian Angina Classification:  Class 0: Asymptomatic;  Cardiology Procedures-Invasive:  cardiac cath (left) January 2014, stent June 2010, AV nodal ablation May 2010, CABG with LIMA to LAD, SVG to OM, SVG to RCA Dr. Roxan Hockey, partialy colectomy for rectal cancer 2016;  Cardiology Procedures-Noninvasive:  echocardiogram;  Cardiac Cath Results:  normal Left main, 90% stenosis proximal LAD, 70% stenosis proximal CFX, 95% stenosis RCA;  LVEF of 60% documented via cardiac cath on 02/24/2012,   ____________________________ CARDIO-PULMONARY TEST DATES EKG Date:  02/28/2014;   Cardiac Cath Date:  02/24/2012;  CABG: 02/25/2012;  Chest Xray Date: 03/24/2012;   ____________________________ FAMILY HISTORY Brother -- Brother alive with problem, Coronary Artery Disease, Dementia/Alzheimers Father -- Congestive heart failure, Pulmonary emphysema, Father dead Mother -- Mother dead, Unknown disease Sister -- Sister alive with problem, Diabetes mellitus ____________________________ SOCIAL HISTORY Alcohol Use:  no alcohol use;  Smoking:  never smoked;  Diet:  regular diet;  Lifestyle:  divorced;  Exercise:  walking;  Occupation:  retired;  Residence:  living with son;   ____________________________ REVIEW OF SYSTEMS General:  obesity  Integumentary:no rashes or new skin lesions. Eyes: wears eye glasses/contact lenses Respiratory: denies dyspnea, cough, wheezing or hemoptysis. Cardiovascular:  please review HPI Abdominal: denies dyspepsia, GI bleeding, constipation, or diarrheaNeurological:  denies headaches, stroke, or TIA Psychiatric:  anxiety  ____________________________ PHYSICAL EXAMINATION VITAL SIGNS  Blood Pressure:  132/64 Sitting, Right arm, regular cuff  , 130/58 Standing, Right arm and regular cuff   Pulse:  80/min. Weight:  156.00 lbs. Height:  62"BMI: 28  Constitutional:  pleasant white female, in no acute distress Skin:  warm and dry to touch, no apparent skin lesions, or  masses  noted. Head:  normocephalic, normal hair pattern, no masses or tenderness Neck:  supple, without massess. No JVD, thyromegaly or carotid bruits. Carotid upstroke normal. Chest:  normal symmetry, clear to auscultation, healed median sternotomy scar Cardiac:  regular rhythm, normal S1 and S2, No S3 or S4, no murmurs, gallops or rubs detected. Abdomen:  abdomen soft,non-tender, no masses, no hepatospenomegaly, or aneurysm noted Peripheral Pulses:  the femoral,dorsalis pedis, and posterior tibial pulses are full and equal bilaterally with no bruits auscultated. Extremities & Back:  well healed saphenous vein donor site LLE, no edema present Neurological:  no gross motor or sensory deficits noted, affect appropriate, oriented x3. ____________________________ MOST RECENT LIPID PANEL 02/27/14  CHOL TOTL 119 mg/dl, LDL 67 NM, HDL 29 mg/dl, TRIGLYCER 114 mg/dl and CHOL/HDL 4 (Calc) ____________________________ IMPRESSIONS/PLAN  1. Coronary artery disease with previous stenting and previous bypass grafting with no symptoms 2. Interval history of rectal cancer 3. Hyperlipidemia currently on no treatment 4. Type 2 diabetes with retinopathy currently stable 5. Overweight  Recommendations:  Long discussion about need to take lipid-lowering therapy. She is agreeable to go back on atorvastatin and a prescription was sent in. Discussed need for additional weight loss. Hospital records reviewed regarding the rectal cancer. Followup in one year.  ____________________________ TODAYS ORDERS  1. Return Visit: 1 year  2. 12 Lead EKG: 1 year                       ____________________________ Cardiology Physician:  Kerry Hough MD [facility]

## 2015-02-24 ENCOUNTER — Other Ambulatory Visit (HOSPITAL_BASED_OUTPATIENT_CLINIC_OR_DEPARTMENT_OTHER): Payer: Commercial Managed Care - HMO

## 2015-02-24 ENCOUNTER — Telehealth: Payer: Self-pay | Admitting: Hematology

## 2015-02-24 ENCOUNTER — Ambulatory Visit (HOSPITAL_BASED_OUTPATIENT_CLINIC_OR_DEPARTMENT_OTHER): Payer: Commercial Managed Care - HMO | Admitting: Hematology

## 2015-02-24 ENCOUNTER — Encounter: Payer: Self-pay | Admitting: Hematology

## 2015-02-24 VITALS — BP 137/55 | HR 75 | Temp 98.5°F | Resp 18 | Ht 62.0 in | Wt 154.6 lb

## 2015-02-24 DIAGNOSIS — C20 Malignant neoplasm of rectum: Secondary | ICD-10-CM

## 2015-02-24 DIAGNOSIS — D696 Thrombocytopenia, unspecified: Secondary | ICD-10-CM

## 2015-02-24 DIAGNOSIS — I10 Essential (primary) hypertension: Secondary | ICD-10-CM | POA: Diagnosis not present

## 2015-02-24 DIAGNOSIS — E119 Type 2 diabetes mellitus without complications: Secondary | ICD-10-CM | POA: Diagnosis not present

## 2015-02-24 LAB — COMPREHENSIVE METABOLIC PANEL
ALBUMIN: 3.4 g/dL — AB (ref 3.5–5.0)
ALK PHOS: 44 U/L (ref 40–150)
ALT: 19 U/L (ref 0–55)
AST: 14 U/L (ref 5–34)
Anion Gap: 9 mEq/L (ref 3–11)
BUN: 11.5 mg/dL (ref 7.0–26.0)
CALCIUM: 9.1 mg/dL (ref 8.4–10.4)
CO2: 25 mEq/L (ref 22–29)
CREATININE: 0.7 mg/dL (ref 0.6–1.1)
Chloride: 108 mEq/L (ref 98–109)
EGFR: >90 mL/min/{1.73_m2} (ref >90)
GLUCOSE: 165 mg/dL — AB (ref 70–140)
Potassium: 4.3 mEq/L (ref 3.5–5.1)
SODIUM: 142 meq/L (ref 136–145)
Total Bilirubin: 0.38 mg/dL (ref 0.20–1.20)
Total Protein: 6.3 g/dL — ABNORMAL LOW (ref 6.4–8.3)

## 2015-02-24 LAB — CBC WITH DIFFERENTIAL/PLATELET
BASO%: 0.5 % (ref 0.0–2.0)
Basophils Absolute: 0 10*3/uL (ref 0.0–0.1)
EOS ABS: 0.1 10*3/uL (ref 0.0–0.5)
EOS%: 1.5 % (ref 0.0–7.0)
HCT: 39.7 % (ref 34.8–46.6)
HEMOGLOBIN: 13.2 g/dL (ref 11.6–15.9)
LYMPH%: 13.8 % — AB (ref 14.0–49.7)
MCH: 31.3 pg (ref 25.1–34.0)
MCHC: 33.4 g/dL (ref 31.5–36.0)
MCV: 93.7 fL (ref 79.5–101.0)
MONO#: 0.4 10*3/uL (ref 0.1–0.9)
MONO%: 7.5 % (ref 0.0–14.0)
NEUT#: 3.7 10*3/uL (ref 1.5–6.5)
NEUT%: 76.7 % (ref 38.4–76.8)
Platelets: 124 10*3/uL — ABNORMAL LOW (ref 145–400)
RBC: 4.23 10*6/uL (ref 3.70–5.45)
RDW: 13.2 % (ref 11.2–14.5)
WBC: 4.8 10*3/uL (ref 3.9–10.3)
lymph#: 0.7 10*3/uL — ABNORMAL LOW (ref 0.9–3.3)

## 2015-02-24 NOTE — Progress Notes (Signed)
[place]  [phone] Fax:([phone]  Clinic Follow up Note   Patient Care Team: Eulas Post, MD as PCP - General Jacolyn Reedy, MD as Consulting Physician (Cardiology) Ladene Artist, MD as Consulting Physician (Gastroenterology) [name], MD as Consulting Physician (General Surgery) Kyung Rudd, MD as Consulting Physician (Radiation Oncology) 02/24/2015  CHIEF COMPLAINTS:  Follow up rectal cancer    Oncology History   Rectal cancer   Staging form: Colon and Rectum, AJCC 7th Edition     Clinical: Stage IIA (T3, N0, M0) - Signed by Truitt Merle, MD on 03/31/2014       Rectal cancer ([place])   03/11/2014 Initial Diagnosis Rectal cancer, cT3N0   03/11/2014 Pathology Results Rectal mass biopsy showed invasive adenocarcinoma. Ascending colon polyp biopsy showed tubulovillous adenoma.   03/11/2014 Procedure Colonoscopy showed 3 sessiile polyp (2 in ascending, 1 in descending colon), not removed,  and a rectal mass measuring 4 x 3 cm. EUS showed T3N0 disease    03/18/2014 Imaging CT CAP showed lower rectal wall thickening, no metastatic disease or adenopathy.    04/11/2014 - 05/18/2014 Radiation Therapy radiation to rectal cancer, 50.4Gy in 28 fractions.   04/11/2014 - 05/18/2014 Chemotherapy capecitabine 1500mg  q12h, on the day of radiation    07/13/2014 Surgery robotic-assisted proximal colectomy and low anterior resection, with splenic flexure mobilization, by Dr. Marcello Moores    07/13/2014 Pathology Results rectosigmoid colon resection, no residual malignant cells.     HISTORY OF PRESENTING ILLNESS:  [name] 65 y.o. female is here because of newly diagnosed rectal cancer.  She has had rectal bleeding for 3 month, blood on surface of stool, 1/2-1 tea spoon each time, intermittent, no rectal pain or other complains. She feels well overall. She denies nausea, abdominal discomfort, change of her appetite or weight loss. She was evaluated by her PCP and was  referred to Dr. Fuller Plan. Her appointment was rescheduled three times due to multiple reasons. She finally had colonoscopy on 03/07/2014, which showed a half circumferential firm mass at the rectum, measuring 4 x 3 cm,, it extended from 7-11 cm in the mid rectum. Additional 2 sessile polyps in the ascending colon and 1 sessile polyp in the sigmoid colon were found and biopsied. The rectal mass biopsy showed adenocarcinoma. EUS was done by Dr. Ardis Hughs showed a T3 lesion, no regional adenopathy. She was seen by colorectal surgeon Dr. Marcello Moores, and was referred to radiation oncologist Dr. Lisbeth Renshaw and the me to discuss neoadjuvant chemotherapy radiation.  She feels well overall, has good appetite and eats well, she has mild fatigue since her CABG 2 years ago.  No chest pain or dyspnea. No significant weight loss recently.   CURRENT THERAPY: Observation  INTERIM HISTORY She came in today for follow-up. She lost to follow-up after her surgery, did not want adjuvant chemotherapy. We called her a few times, and she did not come back. She did see her surgeon Dr. Marcello Moores back, and was told to follow-up with me, so she meets appointment with [place] today.  She is doing well overall, has mild diarrhea, she takes imodium 1-2 tab a day, she is very active, she has chronic insomnia, she take ambien at night. She has good appetite, weight is stable, denies any pain, nausea, abdominal discomfort or other complaints. She has recently moved to [place], which is about 55 minutes drive to [place].   MEDICAL HISTORY:  Past Medical History  Diagnosis Date  . Diabetes mellitus, insulin dependent (IDDM),  uncontrolled ([place])   . Hyperlipidemia   . Hypertensive heart disease   .  Paroxysmal SVT (ANVRT)     S/p AV nodal ablation Dr. Lovena Le 09/28/08   . Depression   . CAD     Coronary disease status post non-ST elevation MI in May 2010 with  PCI of the left circumflex on Jun 24, 2008. She then had a PCI of  the RCA on July 15, 2008. 02/24/12 Cath, Severe 95% LAD, ostial 95% circ, ostial RCA, patent stents in distal RCA and mid circ normal LV 02/26/12 CABG with LIMA to LAD, SVG to RCA, and SVG to OM Dr. Roxan Hockey    . Cataract   . Cancer [facility])     adenocarcinoma of rectum  . S/P radiation therapy 04/11/14-05/18/14    50.4Gy rectal  . Heart murmur     SURGICAL HISTORY: Past Surgical History  Procedure Laterality Date  . Coronary angioplasty with stent placement    . Coronary artery bypass graft  02/26/2012    Roxan Hockey, MD;  Location: [facility];  Service: Open Heart Surgery;  Laterality: N/A;  CABG x three,  using left internal mammary artery  and left leg greater saphenous vein,   . Endovein harvest of greater saphenous vein  02/26/2012  . Left heart catheterization with coronary angiogram N/A 02/24/2012    Procedure: LEFT HEART CATHETERIZATION WITH CORONARY ANGIOGRAM;  Surgeon: Jacolyn Reedy, MD;  Location: [facility] CATH LAB;  Service: Cardiovascular;  Laterality: N/A;  . Eus N/A 03/24/2014    Procedure: LOWER ENDOSCOPIC ULTRASOUND (EUS);  Surgeon: Milus Banister, MD;  Location: [facility];  Service: Endoscopy;  Laterality: N/A;  . Colonoscopy    . Colonoscopy with propofol N/A 07/12/2014    Procedure: COLONOSCOPY WITH PROPOFOL POSSIBLE POLYP RESECTION;  Surgeon: [name], MD;  Location: [facility] ENDOSCOPY;  Service: Endoscopy;  Laterality: N/A;   to be admitted after colonoscopy-robotic surgery on 6/8    SOCIAL HISTORY: History   Social History  . Marital Status: Divorced    Spouse Name: N/A  . Number of Children: One son at age 64  . Years of Education: N/A   Occupational History  . Not on file.   Social History Main Topics  . Smoking status: Never Smoker   . Smokeless tobacco: Never Used  . Alcohol Use: No  . Drug Use: No  . Sexual Activity: No   Other Topics Concern  .  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 Colon cancer  Neg Hx   . Diabetes Father   . Diabetes Brother   . Diabetes Sister   . Colon polyps Maternal Grandmother     ALLERGIES:  has No Known Allergies.  MEDICATIONS:  Current Outpatient Prescriptions  Medication Sig Dispense Refill  . aspirin 81 MG tablet Take 81 mg by mouth at bedtime.     Marland Kitchen atorvastatin (LIPITOR) 40 MG tablet Take 40 mg by mouth daily.    . fish oil-omega-3 fatty acids 1000 MG capsule Take 2 g by mouth 2 (two) times daily.     . furosemide (LASIX) 40 MG tablet Take 1 tablet (40 mg total) by mouth daily as needed for fluid or edema. 30 tablet 0  . insulin aspart (NOVOLOG) 100 UNIT/ML injection Inject 10 Units into the skin 3 (three) times daily with meals. (Patient taking differently: Inject 10 Units into the skin 3 (three) times  daily as needed. Sliding scale) 10 mL 0  . insulin glargine (LANTUS) 100 UNIT/ML injection Inject 0.88 mLs (88 Units total) into the skin at bedtime. (Patient taking differently: Inject 90 Units into the skin at bedtime. ) 10 mL 5  . loperamide (IMODIUM) 2 MG capsule Take 1 capsule (2 mg total) by mouth daily. With dinner. Increase to bid if needed  0  . metFORMIN (GLUCOPHAGE) 1000 MG tablet Take 1 tablet (1,000 mg total) by mouth 2 (two) times daily with a meal. 60 tablet 5  . metoprolol tartrate (LOPRESSOR) 25 MG tablet Take 1 tablet (25 mg total) by mouth 2 (two) times daily. 60 tablet 11  . sertraline (ZOLOFT) 50 MG tablet Take 1 tablet (50 mg total) by mouth at bedtime. 90 tablet 3  . zolpidem (AMBIEN) 10 MG tablet Take 1 tablet (10 mg total) by mouth at bedtime as needed for sleep. 15 tablet 0  . HYDROcodone-acetaminophen (NORCO/VICODIN) 5-325 MG tablet Take 1 tablet by mouth every 6 (six) hours as needed for moderate pain. (Patient not taking: Reported on 02/24/2015) 8 tablet 0  . prochlorperazine (COMPAZINE) 10 MG tablet Take 1 tablet (10 mg total) by mouth every 6 (six) hours as needed for nausea or vomiting. (Patient not taking: Reported on  02/24/2015) 30 tablet 0   No current facility-administered medications for this visit.    REVIEW OF SYSTEMS:   Constitutional: Denies fevers, chills or abnormal night sweats Eyes: Denies blurriness of vision, double vision or watery eyes Ears, nose, mouth, throat, and face: Denies mucositis or sore throat Respiratory: Denies cough, dyspnea or wheezes Cardiovascular: Denies palpitation, chest discomfort or lower extremity swelling Gastrointestinal:  Denies nausea, heartburn or change in bowel habits Skin: Denies abnormal skin rashes Lymphatics: Denies new lymphadenopathy or easy bruising Neurological:Denies numbness, tingling or new weaknesses Behavioral/Psych: Mood is stable, no new changes  All other systems were reviewed with the patient and are negative.  PHYSICAL EXAMINATION: ECOG PERFORMANCE STATUS: 0 - Asymptomatic  Filed Vitals:   02/24/15 1317  BP: 137/55  Pulse: 75  Temp: 98.5 F (36.9 C)  Resp: 18   Filed Weights   02/24/15 1317  Weight: 154 lb 9.6 oz (70.126 kg)    GENERAL:alert, no distress and comfortable SKIN: skin color, texture, turgor are normal, no rashes or significant lesions EYES: normal, conjunctiva are pink and non-injected, sclera clear OROPHARYNX:no exudate, no erythema and lips, buccal mucosa, and tongue normal  NECK: supple, thyroid normal size, non-tender, without nodularity LYMPH:  no palpable lymphadenopathy in the cervical, axillary or inguinal LUNGS: clear to auscultation and percussion with normal breathing effort HEART: regular rate & rhythm and no murmurs and no lower extremity edema ABDOMEN:abdomen soft, non-tender and normal bowel sounds, patient declined rectal exam, inguinal lymph nodes were negative. Musculoskeletal:no cyanosis of digits and no clubbing  PSYCH: alert & oriented x 3 with fluent speech NEURO: no focal motor/sensory deficits  LABORATORY DATA:  I have reviewed the data as listed CBC Latest Ref Rng 02/24/2015 07/16/2014  07/15/2014  WBC 3.9 - 10.3 10e3/uL 4.8 3.2(L) 5.3  Hemoglobin 11.6 - 15.9 g/dL 13.2 9.2(L) 9.5(L)  Hematocrit 34.8 - 46.6 % 39.7 27.3(L) 27.9(L)  Platelets 145 - 400 10e3/uL 124(L) 100(L) 120(L)     Recent Labs  05/16/14 1307 05/30/14 1454  07/14/14 0343 07/15/14 0537 07/16/14 0506 02/24/15 1252  NA 141 142  < > 138 139 143 142  K 4.5 3.8  < > 3.8 3.6 3.6 4.3  CL  --   --   < >  103 105 110  --   CO2 26 22  < > 28 28 28 25   GLUCOSE 175* 98  < > 166* 90 90 165*  BUN 20.7 17.4  < > 8 7 5* 11.5  CREATININE 0.7 0.7  < > 0.62 0.56 0.31* 0.7  CALCIUM 9.3 9.2  < > 8.5* 8.2* 8.3* 9.1  GFRNONAA  --   --   < > >60 >60 >60  --   GFRAA  --   --   < > >60 >60 >60  --   PROT 6.4 6.1*  --   --   --   --  6.3*  ALBUMIN 3.7 3.4*  --   --   --   --  3.4*  AST 21 19  --   --   --   --  14  ALT 33 22  --   --   --   --  19  ALKPHOS 41 45  --   --   --   --  44  BILITOT 0.67 0.43  --   --   --   --  0.38  < > = values in this interval not displayed.   PATHOLOGY REPORT: Diagnosis 07/13/2014 1. Colon, segmental resection for tumor, proximal - TUBULAR ADENOMA (X3). - SIXTEEN BENIGN LYMPH NODES (0/16). - APPENDIX WITH FIBROUS OBLITERATION. - RESECTION MARGINS NEGATIVE FOR DYSPLASIA. - NO HIGH GRADE DYSPLASIA OR MALIGNANCY. - SEE COMMENT. 2. Colon, segmental resection for tumor, rectosigmoid - BENIGN COLONIC MUCOSA WITH FIBROSIS. - RESECTION MARGINS ARE NEGATIVE. - FIVE OF FIVE LYMPH NODES NEGATIVE FOR CARCINOMA (0/5). - NO RESIDUAL DYSPLASIA OR MALIGNANCY IDENTIFIED. - SEE ONCOLOGY TABLE. 3. Colon, resection margin (donut), final distal - BENIGN COLONIC MUCOSA. - NO DYSPLASIA OR MALIGNANCY.  Microscopic Comment 1. Both polyps are entirely submitted. One of the polyp exhibits focal serrated features suggestive of a traditional serrated adenoma. There is no high grade dysplasia or malignancy identified. 2. COLON AND RECTUM (INCLUDING TRANS-ANAL RESECTION): Specimen:  Rectosigmoid. Procedure: Rectosigmoid resection. Tumor site: Original tumor located in rectum. Specimen integrity: Intact. Macroscopic intactness of mesorectum: N/A. Macroscopic tumor perforation: N/A. Invasive tumor: No residual tumor identified. Microscopic extension of invasive tumor: N/A. Lymph-Vascular invasion: N/A. Peri-neural invasion: N/A. Tumor deposit(s) (discontinuous extramural extension): N/A. Resection margins: Negative. Treatment effect (neo-adjuvant therapy): Present (Complete response, score 0) Additional polyp(s): None. Non-neoplastic findings: Fibrosis. Lymph nodes: number examined 5; number positive: 0 Pathologic Staging: ypT0, ypN0, pMX Ancillary studies: N/A. Comment: The patient has received neoadjuvant therapy and there is no residual tumor identified, thus it is staged as ypT0. The fat was cleared and additional tissue was submitted. Given the post-therapy status, only 5 lymph nodes were identified.  RADIOGRAPHIC STUDIES: I have personally reviewed the radiological images as listed and agreed with the findings in the report.  no new scans   ASSESSMENT & PLAN:  65 year old Caucasian female, with past medical history of hypertension, diabetes, multivessel coronary artery disease, status post 2 stents placement and triple bypass 2 years ago. She presented with rectal bleeding.  1. Rectal adenocarcinoma, cT3N0M0, stage IIA, ypT0N0 after neoadjuvant chemoradiation therapy -I reviewed her surgical pathology findings with her, she had complete pathological response, which predicts excellent outcome. -She declined adjuvant chemotherapy and did not follow up after her surgery for 6 months. -I discussed the risk of cancer recurrence in the future. I discussed the surveillance plan, which is a physical exam and lab test (including CBC, CMP and CEA) every  3 months for the first 2 years, then every 6-12 months, colonoscopy in one year, and surveilliance CT scan every  6-12 month for up to 5 year.  -she is due for colonoscopy next months, I encouraged her to call her gastroenterologist Dr. Alferd Apa for appointment. She agrees. Burnis Medin obtain a surveillance CT scan in March.   2. Mild thrombocytopenia -Intermittent for the past few years since her CABG surgery, possible related to her cardiac surgery or chronic ITP -plt stable, we'll continue monitoring.  2. Hypertension, diabetes, coronary artery disease -She will continue follow-up with her primary care physician and cardiologist -We discussed the risk of coronary artery spasm with chemotherapy.   Plan -Colonoscopy next months, I'll send a message to Dr. Fuller Plan -I'll see her back with a CT chest abdomen and pelvis with contrast and lab in 2 months.  All questions were answered. The patient knows to call the clinic with any problems, questions or concerns. I spent 15 minutes counseling the patient face to face. The total time spent in the appointment was 20 minutes and more than 50% was on counseling.     Truitt Merle, MD 02/24/2015 4:54 PM

## 2015-02-24 NOTE — Telephone Encounter (Signed)
per pof to sch pt appt-gave pt opy of avs-adv [facility] will call to sch scan

## 2015-02-27 ENCOUNTER — Telehealth: Payer: Self-pay

## 2015-02-27 NOTE — Telephone Encounter (Signed)
-----   Message from Ladene Artist, MD sent at 02/25/2015 10:36 AM EST ----- Barbera Setters, please contact patient for surveillance colonoscopy in Feb. MS  ----- Message -----    From: Truitt Merle, MD    Sent: 02/24/2015   5:04 PM      To: Ladene Artist, MD, [name], MD  Dr. Fuller Plan,  She is due for colonoscopy next month for rectal cancer surveillance. I asked her to call your office for appointment.  Thanks,  Krista Blue

## 2015-02-27 NOTE — Telephone Encounter (Signed)
Patient contacted.  She is scheduled for pre-visit 03/13/15 and colon 03/28/15

## 2015-03-10 ENCOUNTER — Emergency Department (HOSPITAL_COMMUNITY)
Admission: EM | Admit: 2015-03-10 | Discharge: 2015-03-10 | Discharge status: Against Medical Advice | Payer: Commercial Managed Care - HMO | Attending: Emergency Medicine | Admitting: Emergency Medicine

## 2015-03-10 ENCOUNTER — Encounter (HOSPITAL_COMMUNITY): Payer: Self-pay

## 2015-03-10 DIAGNOSIS — I119 Hypertensive heart disease without heart failure: Secondary | ICD-10-CM | POA: Insufficient documentation

## 2015-03-10 DIAGNOSIS — E119 Type 2 diabetes mellitus without complications: Secondary | ICD-10-CM | POA: Insufficient documentation

## 2015-03-10 DIAGNOSIS — R55 Syncope and collapse: Secondary | ICD-10-CM | POA: Insufficient documentation

## 2015-03-10 DIAGNOSIS — I2510 Atherosclerotic heart disease of native coronary artery without angina pectoris: Secondary | ICD-10-CM | POA: Diagnosis not present

## 2015-03-10 DIAGNOSIS — R011 Cardiac murmur, unspecified: Secondary | ICD-10-CM | POA: Insufficient documentation

## 2015-03-10 LAB — BASIC METABOLIC PANEL
Anion gap: 13 (ref 5–15)
BUN: 16 mg/dL (ref 6–20)
CO2: 27 mmol/L (ref 22–32)
CREATININE: 0.7 mg/dL (ref 0.44–1.00)
Calcium: 9.8 mg/dL (ref 8.9–10.3)
Chloride: 102 mmol/L (ref 101–111)
GFR calc non Af Amer: >60 mL/min (ref >60)
Glucose, Bld: 204 mg/dL — ABNORMAL HIGH (ref 65–99)
Potassium: 4.9 mmol/L (ref 3.5–5.1)
Sodium: 142 mmol/L (ref 135–145)

## 2015-03-10 LAB — URINALYSIS, ROUTINE W REFLEX MICROSCOPIC
Bilirubin Urine: NEGATIVE
GLUCOSE, UA: NEGATIVE mg/dL
Ketones, ur: NEGATIVE mg/dL
Nitrite: NEGATIVE
PROTEIN: NEGATIVE mg/dL
Specific Gravity, Urine: 1.021 (ref 1.005–1.030)
pH: 5 (ref 5.0–8.0)

## 2015-03-10 LAB — CBC
HCT: 39.4 % (ref 36.0–46.0)
Hemoglobin: 14.1 g/dL (ref 12.0–15.0)
MCH: 32.9 pg (ref 26.0–34.0)
MCHC: 35.8 g/dL (ref 30.0–36.0)
MCV: 91.8 fL (ref 78.0–100.0)
PLATELETS: 122 10*3/uL — AB (ref 150–400)
RBC: 4.29 MIL/uL (ref 3.87–5.11)
RDW: 13.4 % (ref 11.5–15.5)
WBC: 5.1 10*3/uL (ref 4.0–10.5)

## 2015-03-10 LAB — URINE MICROSCOPIC-ADD ON

## 2015-03-10 LAB — CBG MONITORING, ED: GLUCOSE-CAPILLARY: 188 mg/dL — AB (ref 65–99)

## 2015-03-10 NOTE — ED Notes (Signed)
Pt presents with witnessed syncopal episode while at cafeteria for lunch.  Pt reports she felt "sleepy" while eating, got up with syncopal episode x 1 minute.  Pt denies any pain from fall, reports taking her sugar before she ate: 140.  Pt denies any pain or shortness of breath, c/o her head feeling "groggy".

## 2015-03-10 NOTE — ED Notes (Signed)
Called for update on VS, pt states she is leaving, pt updated on wait time but pt refusing to stay at this time. Ambulatory out of Ed with all belongings and with steady gait with family .

## 2015-03-13 ENCOUNTER — Ambulatory Visit (AMBULATORY_SURGERY_CENTER): Payer: Self-pay

## 2015-03-13 ENCOUNTER — Telehealth: Payer: Self-pay

## 2015-03-13 VITALS — Ht 60.0 in | Wt 151.8 lb

## 2015-03-13 DIAGNOSIS — C189 Malignant neoplasm of colon, unspecified: Secondary | ICD-10-CM

## 2015-03-13 MED ORDER — SUPREP BOWEL PREP KIT 17.5-3.13-1.6 GM/177ML PO SOLN: 1.0000 | Freq: Once | ORAL | Status: DC

## 2015-03-13 NOTE — Progress Notes (Signed)
No allergies to eggs or soy No diet/weight loss meds No home oxygen No past problems with anesthesia  Has email and internet; registered for emmi

## 2015-03-13 NOTE — Telephone Encounter (Signed)
Yes, ok 

## 2015-03-13 NOTE — Telephone Encounter (Signed)
Dr Fuller Plan,      Pt finished radiation 04/11/14-05/18/14.  She had colectomy June 5th, 2016.  Chemo was finished before surgery.  Just double-checking OK to proceed with colonoscopy.  Thank you, [name]

## 2015-03-15 ENCOUNTER — Other Ambulatory Visit: Payer: Self-pay | Admitting: Family Medicine

## 2015-03-16 ENCOUNTER — Telehealth: Payer: Self-pay | Admitting: Hematology & Oncology

## 2015-03-16 NOTE — Telephone Encounter (Signed)
Patient called stating that she does not want her new pt appt here with Dr. Marin Olp. Patient also stated she does not know why the main cancer center has transferred her from Dr. Burr Medico to Dr. Marin Olp. I advised the pt to contact the main cancer center to inquire.       [facility].

## 2015-03-17 ENCOUNTER — Ambulatory Visit: Payer: Commercial Managed Care - HMO | Admitting: Hematology & Oncology

## 2015-03-17 ENCOUNTER — Other Ambulatory Visit: Payer: Commercial Managed Care - HMO

## 2015-03-17 ENCOUNTER — Ambulatory Visit: Payer: Commercial Managed Care - HMO

## 2015-03-28 ENCOUNTER — Ambulatory Visit (AMBULATORY_SURGERY_CENTER): Payer: Commercial Managed Care - HMO | Admitting: Gastroenterology

## 2015-03-28 ENCOUNTER — Encounter: Payer: Self-pay | Admitting: Gastroenterology

## 2015-03-28 VITALS — BP 139/62 | HR 69 | Temp 98.8°F | Resp 28 | Ht 60.0 in | Wt 151.0 lb

## 2015-03-28 DIAGNOSIS — C20 Malignant neoplasm of rectum: Secondary | ICD-10-CM | POA: Diagnosis not present

## 2015-03-28 DIAGNOSIS — I471 Supraventricular tachycardia: Secondary | ICD-10-CM | POA: Diagnosis not present

## 2015-03-28 DIAGNOSIS — F329 Major depressive disorder, single episode, unspecified: Secondary | ICD-10-CM | POA: Diagnosis not present

## 2015-03-28 DIAGNOSIS — E119 Type 2 diabetes mellitus without complications: Secondary | ICD-10-CM | POA: Diagnosis not present

## 2015-03-28 DIAGNOSIS — E669 Obesity, unspecified: Secondary | ICD-10-CM | POA: Diagnosis not present

## 2015-03-28 DIAGNOSIS — K635 Polyp of colon: Secondary | ICD-10-CM | POA: Diagnosis not present

## 2015-03-28 DIAGNOSIS — C189 Malignant neoplasm of colon, unspecified: Secondary | ICD-10-CM

## 2015-03-28 DIAGNOSIS — Z951 Presence of aortocoronary bypass graft: Secondary | ICD-10-CM | POA: Diagnosis not present

## 2015-03-28 DIAGNOSIS — D124 Benign neoplasm of descending colon: Secondary | ICD-10-CM | POA: Diagnosis not present

## 2015-03-28 LAB — GLUCOSE, CAPILLARY
Glucose-Capillary: 86 mg/dL (ref 65–99)
Glucose-Capillary: 73 mg/dL (ref 65–99)

## 2015-03-28 MED ORDER — SODIUM CHLORIDE 0.9 % IV SOLN: 500.0000 mL | INTRAVENOUS | Status: DC

## 2015-03-28 NOTE — Op Note (Signed)
[place]  [address] [place], [id number]   COLONOSCOPY PROCEDURE REPORT  PATIENT: [name], [name]  MR#: [id number] BIRTHDATE: 1950/07/14 , 80  yrs. old GENDER: female ENDOSCOPIST: Ladene Artist, MD, [facility] REFERRED BY:  [name], M.D. PROCEDURE DATE:  03/28/2015 PROCEDURE:   Colonoscopy, surveillance and Colonoscopy with snare polypectomy First Screening Colonoscopy - Avg.  risk and is 50 yrs.  old or older - No.  Prior Negative Screening - Now for repeat screening. N/A  History of Adenoma - Now for follow-up colonoscopy & has been > or = to 3 yrs.  N/A  Polyps removed today? Yes ASA CLASS:   Class II INDICATIONS:Surveillance due to prior colonic neoplasia, PH Colon or Rectal Adenocarcinoma, and Advanced Neoplasm (= 10 mm, high grade dysplasia, villous component. MEDICATIONS: Monitored anesthesia care, Propofol 200 mg IV, and lidocaine 40 mg IV DESCRIPTION OF PROCEDURE:   After the risks benefits and alternatives of the procedure were thoroughly explained, informed consent was obtained.  The digital rectal exam revealed no abnormalities of the rectum.   The LB [id number]  endoscope was introduced through the anus and advanced to the surgical anastomosis. No adverse events experienced.   The quality of the prep was excellent.  (Suprep was used)  The instrument was then slowly withdrawn as the colon was fully examined. Estimated blood loss is zero unless otherwise noted in this procedure report.    COLON FINDINGS: Two sessile polyps measuring 7 mm in size were found in the descending colon.  Polypectomies were performed with a cold snare.  The resection was complete, the polyp tissue was completely retrieved and sent to histology.   There was evidence of a normal appearing prior surgical anastomosis in the right colon.   There was evidence of a normal appearing prior surgical anastomosis in the rectum.   The examination was otherwise normal.   Retroflexion was not performed due to a narrow rectal vault. The time to cecum = 0.8 Withdrawal time = 12.4   The scope was withdrawn and the procedure completed. COMPLICATIONS: There were no immediate complications.  ENDOSCOPIC IMPRESSION: 1.   Two sessile polyps in the descending colon; polypectomies performed with a cold snare 2.   Prior surgical anastomosis in the right colon 3.   Prior surgical anastomosis in the rectum  RECOMMENDATIONS: 1.  Await pathology results 2.  Repeat Colonoscopy in 2 years.  eSigned:  Ladene Artist, MD, [facility] 03/28/2015 3:58 PM

## 2015-03-28 NOTE — Patient Instructions (Signed)
YOU HAD AN ENDOSCOPIC PROCEDURE TODAY AT [facility]:   Refer to the procedure report that was given to you for any specific questions about what was found during the examination.  If the procedure report does not answer your questions, please call your gastroenterologist to clarify.  If you requested that your care partner not be given the details of your procedure findings, then the procedure report has been included in a sealed envelope for you to review at your convenience later.  YOU SHOULD EXPECT: Some feelings of bloating in the abdomen. Passage of more gas than usual.  Walking can help get rid of the air that was put into your GI tract during the procedure and reduce the bloating. If you had a lower endoscopy (such as a colonoscopy or flexible sigmoidoscopy) you may notice spotting of blood in your stool or on the toilet paper. If you underwent a bowel prep for your procedure, you may not have a normal bowel movement for a few days.  Please Note:  You might notice some irritation and congestion in your nose or some drainage.  This is from the oxygen used during your procedure.  There is no need for concern and it should clear up in a day or so.  SYMPTOMS TO REPORT IMMEDIATELY:   Following lower endoscopy (colonoscopy or flexible sigmoidoscopy):  Excessive amounts of blood in the stool  Significant tenderness or worsening of abdominal pains  Swelling of the abdomen that is new, acute  Fever of 100F or higher  For urgent or emergent issues, a gastroenterologist can be reached at any hour by calling [phone].   DIET: Your first meal following the procedure should be a small meal and then it is ok to progress to your normal diet. Heavy or fried foods are harder to digest and may make you feel nauseous or bloated.  Likewise, meals heavy in dairy and vegetables can increase bloating.  Drink plenty of fluids but you should avoid alcoholic beverages for 24  hours.  ACTIVITY:  You should plan to take it easy for the rest of today and you should NOT DRIVE or use heavy machinery until tomorrow (because of the sedation medicines used during the test).    FOLLOW UP: Our staff will call the number listed on your records the next business day following your procedure to check on you and address any questions or concerns that you may have regarding the information given to you following your procedure. If we do not reach you, we will leave a message.  However, if you are feeling well and you are not experiencing any problems, there is no need to return our call.  We will assume that you have returned to your regular daily activities without incident.  If any biopsies were taken you will be contacted by phone or by letter within the next 1-3 weeks.  Please call us at [phone] if you have not heard about the biopsies in 3 weeks.    SIGNATURES/CONFIDENTIALITY: You and/or your care partner have signed paperwork which will be entered into your electronic medical record.  These signatures attest to the fact that that the information above on your After Visit Summary has been reviewed and is understood.  Full responsibility of the confidentiality of this discharge information lies with you and/or your care-partner.  Polyp handout  Await pathology results Repeat Colonoscopy in 2 years

## 2015-03-28 NOTE — Progress Notes (Signed)
Called to room to assist during endoscopic procedure.  Patient ID and intended procedure confirmed with present staff. Received instructions for my participation in the procedure from the performing physician.  

## 2015-03-28 NOTE — Progress Notes (Signed)
Stable to RR 

## 2015-03-28 NOTE — Progress Notes (Signed)
No egg or soy allergy known to patient  No issues with past sedation with any surgeries  or procedures, no intubation problems  No diet pills per patient No home 02 use per patient  No blood thinners per patient    

## 2015-03-29 ENCOUNTER — Telehealth: Payer: Self-pay | Admitting: Emergency Medicine

## 2015-03-29 LAB — GLUCOSE, CAPILLARY: Glucose-Capillary: 77 mg/dL (ref 65–99)

## 2015-03-29 NOTE — Telephone Encounter (Signed)
  Follow up Call-  Call back number 03/28/2015 03/11/2014  Post procedure Call Back phone  # [phone]  Permission to leave phone message Yes Yes     Patient questions:  Do you have a fever, pain , or abdominal swelling? No. Pain Score  0 *  Have you tolerated food without any problems? Yes.    Have you been able to return to your normal activities? Yes.    Do you have any questions about your discharge instructions: Diet   No. Medications  No. Follow up visit  No.  Do you have questions or concerns about your Care? No.  Actions: * If pain score is 4 or above: No action needed, pain <4.

## 2015-04-04 ENCOUNTER — Encounter: Payer: Self-pay | Admitting: Gastroenterology

## 2015-04-05 ENCOUNTER — Ambulatory Visit (INDEPENDENT_AMBULATORY_CARE_PROVIDER_SITE_OTHER): Payer: Commercial Managed Care - HMO | Admitting: Family Medicine

## 2015-04-05 VITALS — BP 128/64 | HR 82 | Temp 99.0°F | Wt 153.4 lb

## 2015-04-05 DIAGNOSIS — R197 Diarrhea, unspecified: Secondary | ICD-10-CM

## 2015-04-05 DIAGNOSIS — I119 Hypertensive heart disease without heart failure: Secondary | ICD-10-CM | POA: Diagnosis not present

## 2015-04-05 DIAGNOSIS — H9201 Otalgia, right ear: Secondary | ICD-10-CM | POA: Diagnosis not present

## 2015-04-05 DIAGNOSIS — E1159 Type 2 diabetes mellitus with other circulatory complications: Secondary | ICD-10-CM | POA: Diagnosis not present

## 2015-04-05 DIAGNOSIS — E785 Hyperlipidemia, unspecified: Secondary | ICD-10-CM

## 2015-04-05 LAB — HM DIABETES EYE EXAM

## 2015-04-05 LAB — HEPATIC FUNCTION PANEL
ALBUMIN: 4 g/dL (ref 3.5–5.2)
ALT: 20 U/L (ref 0–35)
AST: 16 U/L (ref 0–37)
Alkaline Phosphatase: 39 U/L (ref 39–117)
BILIRUBIN TOTAL: 0.5 mg/dL (ref 0.2–1.2)
Bilirubin, Direct: 0.1 mg/dL (ref 0.0–0.3)
TOTAL PROTEIN: 6.7 g/dL (ref 6.0–8.3)

## 2015-04-05 LAB — LIPID PANEL
CHOLESTEROL: 161 mg/dL (ref 0–200)
HDL: 41.1 mg/dL (ref >39.00)
LDL Cholesterol: 104 mg/dL — ABNORMAL HIGH (ref 0–99)
NonHDL: 119.97
Total CHOL/HDL Ratio: 4
Triglycerides: 80 mg/dL (ref 0.0–149.0)
VLDL: 16 mg/dL (ref 0.0–40.0)

## 2015-04-05 LAB — HEMOGLOBIN A1C: HEMOGLOBIN A1C: 5.8 % (ref 4.6–6.5)

## 2015-04-05 LAB — HM DIABETES FOOT EXAM: HM Diabetic Foot Exam: NORMAL

## 2015-04-05 NOTE — Progress Notes (Signed)
Subjective:    Patient ID: [name], female    DOB: 06-05-1950, 65 y.o.   MRN: [id number]  HPI Patient seen for medical follow-up. Chronic problems include history of type 2 diabetes, chronic thrombocytopenia followed by hematology, history of CAD with prior bypass, history of rectal cancer, obesity, hypertension, dyslipidemia, and history of depression. She had recent repeat colonoscopy which showed adenomatous polyp. No evidence for recurrent cancer.  Medications reviewed. She has not been taking her Lipitor for the past 6 months. She had seen some news reports linking possible association with statins and memory loss. She did not have any myalgias.  Type 2 diabetes on Lantus 88 units once daily and takes NovoLog 10 units 3 times daily with meals. No recent hypoglycemia. Last A1c 5.7% last summer. No recent hypoglycemia. Rare tingling sensation in her feet but no numbness. No other foot concerns. History of diabetic retinopathy followed regularly by ophthalmology with most recent visit November 2016.  She complains of right earache for the past 1 day. No dizziness. No drainage. No fevers or chills. She's had some persistent diarrhea following her rectal surgery. She has discussed with her surgeon regarding this. Currently takes Imodium once or twice a day and this controls it. She's tried fiber supple patient without improvement.  Past Medical History  Diagnosis Date  . Diabetes mellitus, insulin dependent (IDDM), uncontrolled ([place])   . Hyperlipidemia   . Hypertensive heart disease   .  Paroxysmal SVT (ANVRT)     S/p AV nodal ablation Dr. Lovena Le 09/28/08   . Depression   . CAD     Coronary disease status post non-ST elevation MI in May 2010 with  PCI of the left circumflex on Jun 24, 2008. She then had a PCI of  the RCA on July 15, 2008. 02/24/12 Cath, Severe 95% LAD, ostial 95% circ, ostial RCA, patent stents in distal RCA and mid circ normal LV 02/26/12 CABG with LIMA to LAD, SVG to  RCA, and SVG to OM Dr. Roxan Hockey    . Cataract   . Cancer [facility])     adenocarcinoma of rectum  . S/P radiation therapy 04/11/14-05/18/14    50.4Gy rectal  . Heart murmur    Past Surgical History  Procedure Laterality Date  . Coronary angioplasty with stent placement    . Coronary artery bypass graft  02/26/2012    Roxan Hockey, MD;  Location: [facility];  Service: Open Heart Surgery;  Laterality: N/A;  CABG x three,  using left internal mammary artery  and left leg greater saphenous vein,   . Endovein harvest of greater saphenous vein  02/26/2012  . Left heart catheterization with coronary angiogram N/A 02/24/2012    Procedure: LEFT HEART CATHETERIZATION WITH CORONARY ANGIOGRAM;  Surgeon: Jacolyn Reedy, MD;  Location: [facility] CATH LAB;  Service: Cardiovascular;  Laterality: N/A;  . Eus N/A 03/24/2014    Procedure: LOWER ENDOSCOPIC ULTRASOUND (EUS);  Surgeon: Milus Banister, MD;  Location: [facility];  Service: Endoscopy;  Laterality: N/A;  . Colonoscopy    . Colonoscopy with propofol N/A 07/12/2014    Procedure: COLONOSCOPY WITH PROPOFOL POSSIBLE POLYP RESECTION;  Surgeon: [name], MD;  Location: [facility];  Service: Endoscopy;  Laterality: N/A;   to be admitted after colonoscopy-robotic surgery on 6/8    reports that she has never smoked. She has never used smokeless tobacco. She reports that she does not drink alcohol or use illicit drugs. family history includes Colon polyps in her maternal grandmother; Diabetes  in her brother, father, and sister. There is no history of Colon cancer. No Known Allergies    Review of Systems  Constitutional: Negative for fatigue and unexpected weight change.  Eyes: Negative for visual disturbance.  Respiratory: Negative for cough, chest tightness, shortness of breath and wheezing.   Cardiovascular: Negative for chest pain, palpitations and leg swelling.  Gastrointestinal: Positive for diarrhea. Negative for nausea, vomiting, abdominal pain and blood in  stool.  Endocrine: Negative for polydipsia and polyuria.  Genitourinary: Negative for dysuria.  Neurological: Negative for dizziness, seizures, syncope, weakness, light-headedness and headaches.       Objective:   Physical Exam  Constitutional: She appears well-developed and well-nourished.  HENT:  Right Ear: External ear normal.  Left Ear: External ear normal.  Neck: Neck supple. No thyromegaly present.  Cardiovascular: Normal rate and regular rhythm.  Exam reveals no gallop.   Murmur heard. Pulmonary/Chest: Effort normal and breath sounds normal. No respiratory distress. She has no wheezes. She has no rales.  Musculoskeletal: She exhibits no edema.  Skin:  Feet reveal no skin lesions. Good distal foot pulses. Good capillary refill. No calluses. Normal sensation with monofilament testing           Assessment & Plan:  #1 right otalgia. Normal exam. Reassurance given. Etiology unclear. Touch base if persists  #2 type 2 diabetes. History of recent good control. Recheck A1c.  #3 dyslipidemia. Patient currently not on Lipitor. Repeat lipid panel. We discussed possible change to medication such as Crestor which she is willing to consider  #4 frequent loose stools controlled with Imodium. Recent colonoscopy as above

## 2015-04-05 NOTE — Progress Notes (Signed)
Pre visit review using our clinic review tool, if applicable. No additional management support is needed unless otherwise documented below in the visit note. 

## 2015-04-12 ENCOUNTER — Telehealth: Payer: Self-pay | Admitting: Family Medicine

## 2015-04-12 DIAGNOSIS — E785 Hyperlipidemia, unspecified: Secondary | ICD-10-CM

## 2015-04-12 MED ORDER — ROSUVASTATIN CALCIUM 20 MG PO TABS: 20.0000 mg | ORAL_TABLET | Freq: Every day | ORAL | Status: DC

## 2015-04-12 MED ORDER — INSULIN GLARGINE 100 UNIT/ML ~~LOC~~ SOLN: SUBCUTANEOUS | Status: DC

## 2015-04-12 NOTE — Telephone Encounter (Signed)
Pt states at last visit it was discussed for her to switch to Crestor. (from lipitor) Pt would like to proceed with this prescription. 90 day  Pt would like to change her LANTUS 100 UNIT/ML injection  To a 90 day.  She has been getting only one vial/month.  Wants to change all her meds to a 90 day if possible.  [facility]/ [place]

## 2015-04-12 NOTE — Telephone Encounter (Signed)
Rx sent.  Lab ordered.  Appointment made.

## 2015-04-12 NOTE — Telephone Encounter (Signed)
Okay to switch?  

## 2015-04-12 NOTE — Telephone Encounter (Signed)
D/c Lipitor.  Start Crestor 20 mg po qd and repeat lipids and hepatic in 2 months.

## 2015-04-14 ENCOUNTER — Other Ambulatory Visit: Payer: Self-pay

## 2015-04-14 ENCOUNTER — Ambulatory Visit: Payer: Commercial Managed Care - HMO

## 2015-04-14 ENCOUNTER — Ambulatory Visit: Payer: Self-pay | Admitting: Hematology & Oncology

## 2015-04-18 ENCOUNTER — Ambulatory Visit (HOSPITAL_COMMUNITY): Payer: Commercial Managed Care - HMO

## 2015-04-18 ENCOUNTER — Ambulatory Visit: Payer: Self-pay | Admitting: Hematology

## 2015-04-18 ENCOUNTER — Other Ambulatory Visit: Payer: Self-pay

## 2015-05-09 ENCOUNTER — Other Ambulatory Visit: Payer: Self-pay | Admitting: Family Medicine

## 2015-05-31 ENCOUNTER — Other Ambulatory Visit (HOSPITAL_BASED_OUTPATIENT_CLINIC_OR_DEPARTMENT_OTHER): Payer: Commercial Managed Care - HMO

## 2015-05-31 ENCOUNTER — Encounter: Payer: Self-pay | Admitting: Hematology & Oncology

## 2015-05-31 ENCOUNTER — Ambulatory Visit: Payer: Commercial Managed Care - HMO

## 2015-05-31 ENCOUNTER — Ambulatory Visit (HOSPITAL_BASED_OUTPATIENT_CLINIC_OR_DEPARTMENT_OTHER): Payer: Commercial Managed Care - HMO | Admitting: Hematology & Oncology

## 2015-05-31 VITALS — BP 113/75 | HR 71 | Temp 98.0°F | Resp 20 | Ht 60.0 in | Wt 151.0 lb

## 2015-05-31 DIAGNOSIS — K909 Intestinal malabsorption, unspecified: Secondary | ICD-10-CM

## 2015-05-31 DIAGNOSIS — C20 Malignant neoplasm of rectum: Secondary | ICD-10-CM

## 2015-05-31 DIAGNOSIS — E119 Type 2 diabetes mellitus without complications: Secondary | ICD-10-CM | POA: Diagnosis not present

## 2015-05-31 DIAGNOSIS — R197 Diarrhea, unspecified: Secondary | ICD-10-CM

## 2015-05-31 DIAGNOSIS — Z85048 Personal history of other malignant neoplasm of rectum, rectosigmoid junction, and anus: Secondary | ICD-10-CM | POA: Diagnosis not present

## 2015-05-31 DIAGNOSIS — E1159 Type 2 diabetes mellitus with other circulatory complications: Secondary | ICD-10-CM

## 2015-05-31 LAB — COMPREHENSIVE METABOLIC PANEL
ALT: 21 U/L (ref 0–55)
ANION GAP: 10 meq/L (ref 3–11)
AST: 20 U/L (ref 5–34)
Albumin: 3.9 g/dL (ref 3.5–5.0)
Alkaline Phosphatase: 39 U/L — ABNORMAL LOW (ref 40–150)
BILIRUBIN TOTAL: 0.54 mg/dL (ref 0.20–1.20)
BUN: 16.7 mg/dL (ref 7.0–26.0)
CHLORIDE: 103 meq/L (ref 98–109)
CO2: 27 meq/L (ref 22–29)
CREATININE: 0.6 mg/dL (ref 0.6–1.1)
Calcium: 9.7 mg/dL (ref 8.4–10.4)
EGFR: >90 mL/min/{1.73_m2} (ref >90)
GLUCOSE: 64 mg/dL — AB (ref 70–140)
Potassium: 4.2 mEq/L (ref 3.5–5.1)
SODIUM: 140 meq/L (ref 136–145)
TOTAL PROTEIN: 6.8 g/dL (ref 6.4–8.3)

## 2015-05-31 LAB — CBC WITH DIFFERENTIAL (CANCER CENTER ONLY)
BASO#: 0 10*3/uL (ref 0.0–0.2)
BASO%: 0.3 % (ref 0.0–2.0)
EOS%: 1.5 % (ref 0.0–7.0)
Eosinophils Absolute: 0.1 10*3/uL (ref 0.0–0.5)
HCT: 38.5 % (ref 34.8–46.6)
HGB: 13.6 g/dL (ref 11.6–15.9)
LYMPH#: 1.1 10*3/uL (ref 0.9–3.3)
LYMPH%: 19.2 % (ref 14.0–48.0)
MCH: 32.5 pg (ref 26.0–34.0)
MCHC: 35.3 g/dL (ref 32.0–36.0)
MCV: 92 fL (ref 81–101)
MONO#: 0.6 10*3/uL (ref 0.1–0.9)
MONO%: 9.4 % (ref 0.0–13.0)
NEUT#: 4.1 10*3/uL (ref 1.5–6.5)
NEUT%: 69.6 % (ref 39.6–80.0)
PLATELETS: 140 10*3/uL — AB (ref 145–400)
RBC: 4.19 10*6/uL (ref 3.70–5.32)
RDW: 13.3 % (ref 11.1–15.7)
WBC: 6 10*3/uL (ref 3.9–10.0)

## 2015-05-31 MED ORDER — DIPHENOXYLATE-ATROPINE 2.5-0.025 MG PO TABS: 1.0000 | ORAL_TABLET | Freq: Four times a day (QID) | ORAL | Status: DC | PRN

## 2015-05-31 NOTE — Progress Notes (Signed)
Referral MD  Reason for Referral: Stage IIA (T3N0M0) adenocarcinoma of the rectum-status post neoadjuvant chemotherapy/radiation therapy and surgery in June 2016   Chief Complaint  Patient presents with  . Other    New Patient  : I would like to see a new doctor as I moved away from [place].  HPI: Ms. Foulke is a very charming 65 year old white female. She has a history of clinical stage IIa rectal cancer. She was diagnosed with some rectal bleeding back in February 2016. She underwent a colonoscopy. Biopsy showed that there was a rectal mass with an invasive adenocarcinoma.  She went ahead and received neoadjuvant Xeloda/radiation. She had a very good response. Her if she then underwent surgical resection. She did not require a colostomy. This was done in June 2016. The pathology report [id number]) showed no viable tumor left. As such, she had a repleted response to treatment.  Her problem has been diarrhea. She's had quite a bit of diarrhea since her surgery. She's been on Imodium. This has not been helping all that much. She is worried that she started to get nightmares., Sure of this is from the Imodium or from something else.  She does have diabetes. She is on metformin. She has had diabetes for probably 17 years. She has had cataracts. She's had a CABG. There is no obvious neuropathy. She's had no nephropathy.  She recently moved out to [facility]. That is closer to [place] area and as such, it's easier for her to follow-up with [place].  Again the diarrhea really bothers her. She has not seen her gastroenterologist. I'll be she can remember who it is.  She's had no bleeding. There's not been any weight loss or weight gain. She's had no abdominal pain. She's had no leg swelling. She's had no rashes.  Overall, her performance status is ECOG 1.                 Past Medical History  Diagnosis Date  . Diabetes mellitus, insulin dependent (IDDM), uncontrolled ([place])   .  Hyperlipidemia   . Hypertensive heart disease   .  Paroxysmal SVT (ANVRT)     S/p AV nodal ablation Dr. Lovena Le 09/28/08   . Depression   . CAD     Coronary disease status post non-ST elevation MI in May 2010 with  PCI of the left circumflex on Jun 24, 2008. She then had a PCI of  the RCA on July 15, 2008. 02/24/12 Cath, Severe 95% LAD, ostial 95% circ, ostial RCA, patent stents in distal RCA and mid circ normal LV 02/26/12 CABG with LIMA to LAD, SVG to RCA, and SVG to OM Dr. Roxan Hockey    . Cataract   . Cancer [facility])     adenocarcinoma of rectum  . S/P radiation therapy 04/11/14-05/18/14    50.4Gy rectal  . Heart murmur   :  Past Surgical History  Procedure Laterality Date  . Coronary angioplasty with stent placement    . Coronary artery bypass graft  02/26/2012    Roxan Hockey, MD;  Location: [facility];  Service: Open Heart Surgery;  Laterality: N/A;  CABG x three,  using left internal mammary artery  and left leg greater saphenous vein,   . Endovein harvest of greater saphenous vein  02/26/2012  . Left heart catheterization with coronary angiogram N/A 02/24/2012    Procedure: LEFT HEART CATHETERIZATION WITH CORONARY ANGIOGRAM;  Surgeon: Jacolyn Reedy, MD;  Location: [facility] CATH LAB;  Service: Cardiovascular;  Laterality: N/A;  .  Eus N/A 03/24/2014    Procedure: LOWER ENDOSCOPIC ULTRASOUND (EUS);  Surgeon: Milus Banister, MD;  Location: [facility];  Service: Endoscopy;  Laterality: N/A;  . Colonoscopy    . Colonoscopy with propofol N/A 07/12/2014    Procedure: COLONOSCOPY WITH PROPOFOL POSSIBLE POLYP RESECTION;  Surgeon: [name], MD;  [facility];  Service: Endoscopy;  Laterality: N/A;   to be admitted after colonoscopy-robotic surgery on 6/8  :   Current outpatient prescriptions:  .  aspirin 81 MG tablet, Take 81 mg by mouth at bedtime. , Disp: , Rfl:  .  insulin glargine (LANTUS) 100 UNIT/ML injection, INJECT 88 UNITS INTO THE SKIN AT BEDTIME, Disp: 30 mL, Rfl: 5 .  loperamide  (IMODIUM) 2 MG capsule, Take 1 capsule (2 mg total) by mouth daily. With dinner. Increase to bid if needed, Disp: , Rfl: 0 .  metFORMIN (GLUCOPHAGE) 1000 MG tablet, Take 1 tablet (1,000 mg total) by mouth 2 (two) times daily with a meal., Disp: 60 tablet, Rfl: 5 .  metoprolol tartrate (LOPRESSOR) 25 MG tablet, Take 1 tablet (25 mg total) by mouth 2 (two) times daily., Disp: 60 tablet, Rfl: 11 .  NOVOLOG 100 UNIT/ML injection, INJECT 10 UNITS INTO THE SKIN THREE TIMES DAILY WITH MEALS, Disp: 10 mL, Rfl: 5 .  rosuvastatin (CRESTOR) 20 MG tablet, Take 1 tablet (20 mg total) by mouth daily., Disp: 90 tablet, Rfl: 1 .  sertraline (ZOLOFT) 50 MG tablet, Take 1 tablet (50 mg total) by mouth at bedtime., Disp: 90 tablet, Rfl: 3 .  zolpidem (AMBIEN) 10 MG tablet, TAKE 1 TABLET BY MOUTH EVERY DAY AT BEDTIME AS NEEDED FOR SLEEP, Disp: 15 tablet, Rfl: 0 .  diphenoxylate-atropine (LOMOTIL) 2.5-0.025 MG tablet, Take 1 tablet by mouth 4 (four) times daily as needed for diarrhea or loose stools., Disp: 100 tablet, Rfl: 0:  :  No Known Allergies:  Family History  Problem Relation Age of Onset  . Colon cancer Neg Hx   . Diabetes Father   . Diabetes Brother   . Diabetes Sister   . Colon polyps Maternal Grandmother   :  Social History   Social History  . Marital Status: Divorced    Spouse Name: N/A  . Number of Children: N/A  . Years of Education: N/A   Occupational History  . Not on file.   Social History Main Topics  . Smoking status: Never Smoker   . Smokeless tobacco: Never Used  . Alcohol Use: No  . Drug Use: No  . Sexual Activity: No   Other Topics Concern  . 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   Also has a dog   Enjoys reading, gardening, going to movies or just getting out with her sister    [employer] to church and bible study group on  Tuesdays.   Independent and drives   Says she copes with stress by prayer   :  Pertinent items are noted in HPI.  Exam: @IPVITALS @ Mildly obese white female in no obvious distress. Vital signs are temperature of 98. Pulse 71. Blood pressure 113/75. Weight is 151 pounds. Head and neck exam shows no ocular or oral lesions. She has no palpable cervical or supraclavicular lymph nodes. Lungs are clear. Cardiac  exam regular rate and rhythm with no murmurs, rubs or bruits. Abdomen is soft. She has good bowel sounds. She has well-healed laparoscopy scars. There is no fluid wave. There is no palpable liver or spleen tip. Back exam shows no tenderness over the spine, ribs or hips. External shows no clubbing, cyanosis or edema. She has good range of motion of her joints. Skin exam shows no rashes, ecchymoses or petechia. Neurological exam shows no focal neurological deficits.    Recent Labs  05/31/15 1350  WBC 6.0  HGB 13.6  HCT 38.5  PLT 140*    Recent Labs  05/31/15 1351  NA 140  K 4.2  CO2 27  GLUCOSE 64*  BUN 16.7  CREATININE 0.6  CALCIUM 9.7    Blood smear review:  None  Pathology: See above     Assessment and Plan:  Ms. Ciliberto is a very charming 65 year old white female. She has had a clinical stage IIa rectal cancer. However, at the time of surgery, she was found have no active disease. She had 21 lymph nodes that were checked and these were all negative.  I believe that she is cured. I think the risk of her cancer coming back is probably less than 10%.  The main problem is her diarrhea. I am not sure that this can be treated. She has diabetes. She may have some element of gastro-paresis and maybe dumping.  I thought that we might try some Lomotil on her. I also told her to try a probiotic.  Another possibility would be to add Creon. She may have some pancreatic issues given that she is diabetic.  I spent about 50 minutes with  her. I went over her labs. We talked about how we can try to help with the diarrhea. She was thankful for any help that we could try to give her. This will hopefully improve her quality of life.  I did go ahead and give her a prayer blanket. She is very thankful for this.  I'll like to see her back in 3 months.

## 2015-06-01 ENCOUNTER — Other Ambulatory Visit: Payer: Self-pay

## 2015-06-01 ENCOUNTER — Ambulatory Visit: Payer: Self-pay | Admitting: Hematology & Oncology

## 2015-06-01 ENCOUNTER — Ambulatory Visit: Payer: Commercial Managed Care - HMO

## 2015-06-01 LAB — LACTATE DEHYDROGENASE: LDH: 177 U/L (ref 125–245)

## 2015-06-01 LAB — CEA: CEA: 0.9 ng/mL (ref 0.0–4.7)

## 2015-06-13 ENCOUNTER — Encounter: Payer: Self-pay | Admitting: Family Medicine

## 2015-06-13 ENCOUNTER — Other Ambulatory Visit (INDEPENDENT_AMBULATORY_CARE_PROVIDER_SITE_OTHER): Payer: Commercial Managed Care - HMO

## 2015-06-13 DIAGNOSIS — E785 Hyperlipidemia, unspecified: Secondary | ICD-10-CM

## 2015-06-13 LAB — HEPATIC FUNCTION PANEL
ALBUMIN: 4 g/dL (ref 3.5–5.2)
ALK PHOS: 36 U/L — AB (ref 39–117)
ALT: 16 U/L (ref 0–35)
AST: 13 U/L (ref 0–37)
Bilirubin, Direct: 0.1 mg/dL (ref 0.0–0.3)
Total Bilirubin: 0.4 mg/dL (ref 0.2–1.2)
Total Protein: 6.5 g/dL (ref 6.0–8.3)

## 2015-06-13 LAB — LIPID PANEL
CHOLESTEROL: 91 mg/dL (ref 0–200)
HDL: 37.2 mg/dL — ABNORMAL LOW (ref >39.00)
LDL Cholesterol: 36 mg/dL (ref 0–99)
NonHDL: 53.66
Total CHOL/HDL Ratio: 2
Triglycerides: 86 mg/dL (ref 0.0–149.0)
VLDL: 17.2 mg/dL (ref 0.0–40.0)

## 2015-07-07 ENCOUNTER — Other Ambulatory Visit: Payer: Self-pay | Admitting: Family Medicine

## 2015-08-01 ENCOUNTER — Other Ambulatory Visit: Payer: Self-pay | Admitting: Hematology & Oncology

## 2015-09-01 ENCOUNTER — Other Ambulatory Visit (HOSPITAL_BASED_OUTPATIENT_CLINIC_OR_DEPARTMENT_OTHER): Payer: Commercial Managed Care - HMO

## 2015-09-01 ENCOUNTER — Ambulatory Visit (HOSPITAL_BASED_OUTPATIENT_CLINIC_OR_DEPARTMENT_OTHER): Payer: Commercial Managed Care - HMO | Admitting: Hematology & Oncology

## 2015-09-01 ENCOUNTER — Other Ambulatory Visit: Payer: Self-pay | Admitting: Hematology & Oncology

## 2015-09-01 VITALS — BP 116/56 | HR 66 | Temp 97.7°F | Resp 18 | Wt 151.0 lb

## 2015-09-01 DIAGNOSIS — Z85048 Personal history of other malignant neoplasm of rectum, rectosigmoid junction, and anus: Secondary | ICD-10-CM

## 2015-09-01 DIAGNOSIS — K909 Intestinal malabsorption, unspecified: Secondary | ICD-10-CM | POA: Diagnosis not present

## 2015-09-01 DIAGNOSIS — E1159 Type 2 diabetes mellitus with other circulatory complications: Secondary | ICD-10-CM

## 2015-09-01 DIAGNOSIS — R197 Diarrhea, unspecified: Secondary | ICD-10-CM

## 2015-09-01 DIAGNOSIS — C20 Malignant neoplasm of rectum: Secondary | ICD-10-CM

## 2015-09-01 DIAGNOSIS — K8689 Other specified diseases of pancreas: Secondary | ICD-10-CM

## 2015-09-01 LAB — COMPREHENSIVE METABOLIC PANEL
ALT: 22 U/L (ref 0–55)
AST: 18 U/L (ref 5–34)
Albumin: 3.8 g/dL (ref 3.5–5.0)
Alkaline Phosphatase: 39 U/L — ABNORMAL LOW (ref 40–150)
Anion Gap: 11 mEq/L (ref 3–11)
BUN: 17.1 mg/dL (ref 7.0–26.0)
CHLORIDE: 106 meq/L (ref 98–109)
CO2: 23 meq/L (ref 22–29)
Calcium: 9.6 mg/dL (ref 8.4–10.4)
Creatinine: 0.7 mg/dL (ref 0.6–1.1)
GLUCOSE: 142 mg/dL — AB (ref 70–140)
POTASSIUM: 4.3 meq/L (ref 3.5–5.1)
SODIUM: 140 meq/L (ref 136–145)
Total Bilirubin: 0.46 mg/dL (ref 0.20–1.20)
Total Protein: 6.9 g/dL (ref 6.4–8.3)

## 2015-09-01 LAB — CBC WITH DIFFERENTIAL (CANCER CENTER ONLY)
BASO#: 0 10*3/uL (ref 0.0–0.2)
BASO%: 0.2 % (ref 0.0–2.0)
EOS ABS: 0.1 10*3/uL (ref 0.0–0.5)
EOS%: 1.5 % (ref 0.0–7.0)
HCT: 37.5 % (ref 34.8–46.6)
HGB: 13.3 g/dL (ref 11.6–15.9)
LYMPH#: 0.9 10*3/uL (ref 0.9–3.3)
LYMPH%: 14.1 % (ref 14.0–48.0)
MCH: 33.1 pg (ref 26.0–34.0)
MCHC: 35.5 g/dL (ref 32.0–36.0)
MCV: 93 fL (ref 81–101)
MONO#: 0.5 10*3/uL (ref 0.1–0.9)
MONO%: 7.8 % (ref 0.0–13.0)
NEUT#: 4.7 10*3/uL (ref 1.5–6.5)
NEUT%: 76.4 % (ref 39.6–80.0)
PLATELETS: 120 10*3/uL — AB (ref 145–400)
RBC: 4.02 10*6/uL (ref 3.70–5.32)
RDW: 13.3 % (ref 11.1–15.7)
WBC: 6.1 10*3/uL (ref 3.9–10.0)

## 2015-09-01 MED ORDER — PANCRELIPASE (LIP-PROT-AMYL) 24000-76000 UNITS PO CPEP: 1.0000 | ORAL_CAPSULE | Freq: Three times a day (TID) | ORAL | 3 refills | Status: DC

## 2015-09-01 NOTE — Addendum Note (Signed)
Addended by: Burney Gauze R on: 09/01/2015 01:23 PM   Modules accepted: Orders

## 2015-09-01 NOTE — Progress Notes (Signed)
Hematology and Oncology Follow Up Visit  [name] [id number] [birth year] 65 y.o. 09/01/2015   Principle Diagnosis:  Stage IIA (T3N0M0) rectal cancer - s/p neoadjuvant chemo/radiation therapy and surgery in 07/2014 Current Therapy:    Observation     Interim History:  [name] is back for follow-up. We first saw her back in April. At that point time, her main problem had been diarrhea. This is been going on since surgery. I'm sure that this probably is a reflection of her past treatments.  Of note, she does have diabetes. I spoke she may have some element of dumping syndrome.  She takes some Imodium. She says she has about 4 episodes a week.   She has no abdominal pain. She has no bleeding. She has no cough or shortness of breath.  Her CEA was saw her was only 0.9.  Overall, her performance status is ECOG 1.  Medications:  Current Outpatient Prescriptions:  .  aspirin 81 MG tablet, Take 81 mg by mouth at bedtime. , Disp: , Rfl:  .  diphenoxylate-atropine (LOMOTIL) 2.5-0.025 MG tablet, TAKE 1 TABLET BY MOUTH FOUR TIMES DAILY AS NEEDED FOR DIARRHEA OR LOOSE STOOLS, Disp: 100 tablet, Rfl: 0 .  insulin glargine (LANTUS) 100 UNIT/ML injection, INJECT 88 UNITS INTO THE SKIN AT BEDTIME, Disp: 30 mL, Rfl: 5 .  loperamide (IMODIUM) 2 MG capsule, Take 1 capsule (2 mg total) by mouth daily. With dinner. Increase to bid if needed, Disp: , Rfl: 0 .  metFORMIN (GLUCOPHAGE) 1000 MG tablet, TAKE 1 TABLET BY MOUTH TWICE DAILY WITH A MEAL, Disp: 60 tablet, Rfl: 11 .  metoprolol tartrate (LOPRESSOR) 25 MG tablet, Take 1 tablet (25 mg total) by mouth 2 (two) times daily., Disp: 60 tablet, Rfl: 11 .  NOVOLOG 100 UNIT/ML injection, INJECT 10 UNITS INTO THE SKIN THREE TIMES DAILY WITH MEALS, Disp: 10 mL, Rfl: 5 .  rosuvastatin (CRESTOR) 20 MG tablet, Take 1 tablet (20 mg total) by mouth daily., Disp: 90 tablet, Rfl: 1 .  sertraline (ZOLOFT) 50 MG tablet, Take 1 tablet (50 mg total) by mouth at  bedtime., Disp: 90 tablet, Rfl: 3 .  zolpidem (AMBIEN) 10 MG tablet, TAKE 1 TABLET BY MOUTH EVERY DAY AT BEDTIME AS NEEDED FOR SLEEP, Disp: 15 tablet, Rfl: 0  Allergies: No Known Allergies  Past Medical History, Surgical history, Social history, and Family History were reviewed and updated.  Review of Systems:  As above  Physical Exam:  weight is 151 lb (68.5 kg). Her oral temperature is 97.7 F (36.5 C). Her blood pressure is 116/56 (abnormal) and her pulse is 66. Her respiration is 18.   Wt Readings from Last 3 Encounters:  09/01/15 151 lb (68.5 kg)  05/31/15 151 lb (68.5 kg)  04/05/15 153 lb 6.4 oz (69.6 kg)      Well-developed and well-nourished white female in no obvious distress. Head and neck exam shows no ocular or oral lesions. She has no palpable cervical or supraclavicular lymph nodes. Lungs are clear. Cardiac exam regular rate and rhythm with no murmurs, rubs or bruits. Abdomen is soft. She has good bowel sounds. She has well-healed laparotomy scar. There is no fluid wave. There is no palpable liver or spleen tip. Back exam shows no tenderness over the spine, ribs or hips. Extremities shows no clubbing, cyanosis or edema. Neurological exam shows no focal neurological deficits.  Lab Results  Component Value Date   WBC 6.1 09/01/2015   HGB 13.3 09/01/2015   HCT  37.5 09/01/2015   MCV 93 09/01/2015   PLT 120 (L) 09/01/2015     Chemistry      Component Value Date/Time   NA 140 05/31/2015 1351   K 4.2 05/31/2015 1351   CL 102 03/10/2015 1325   CO2 27 05/31/2015 1351   BUN 16.7 05/31/2015 1351   CREATININE 0.6 05/31/2015 1351      Component Value Date/Time   CALCIUM 9.7 05/31/2015 1351   ALKPHOS 36 (L) 06/13/2015 0917   ALKPHOS 39 (L) 05/31/2015 1351   AST 13 06/13/2015 0917   AST 20 05/31/2015 1351   ALT 16 06/13/2015 0917   ALT 21 05/31/2015 1351   BILITOT 0.4 06/13/2015 0917   BILITOT 0.54 05/31/2015 1351         Impression and Plan: [name] is a  65 year old white female. She has a history of stage IIA rectal cancer. I don't think this is an issue for [place] right now. We can follow her CEA level. I don't think she needs any scans.  The diarrhea is her biggest problem. I think this probably is a reflection of her having a shortened gut and possibly radiation.  I do want to try her on Creon. Hopefully this might give her some help.  I want to see her back in 3 months. I just want to follow her up without any labs and see how the diarrhea is.  If it is not improved, she may have to go back to gastroenterology for another evaluation.   Volanda Napoleon, MD 7/28/20171:07 PM

## 2015-09-02 LAB — CEA: CEA1: 0.9 ng/mL (ref 0.0–4.7)

## 2015-09-19 ENCOUNTER — Other Ambulatory Visit: Payer: Self-pay | Admitting: Hematology & Oncology

## 2015-09-20 ENCOUNTER — Other Ambulatory Visit: Payer: Self-pay | Admitting: Hematology & Oncology

## 2015-10-06 ENCOUNTER — Other Ambulatory Visit: Payer: Self-pay | Admitting: Family Medicine

## 2015-10-11 ENCOUNTER — Other Ambulatory Visit: Payer: Self-pay

## 2015-10-11 NOTE — Telephone Encounter (Signed)
Rx refill sent to pharmacy. 

## 2015-10-12 ENCOUNTER — Ambulatory Visit: Payer: Commercial Managed Care - HMO | Admitting: Family Medicine

## 2015-10-13 ENCOUNTER — Ambulatory Visit (INDEPENDENT_AMBULATORY_CARE_PROVIDER_SITE_OTHER): Payer: Commercial Managed Care - HMO | Admitting: Family Medicine

## 2015-10-13 VITALS — BP 110/60 | HR 77 | Temp 97.5°F | Ht 60.0 in | Wt 154.6 lb

## 2015-10-13 DIAGNOSIS — E1159 Type 2 diabetes mellitus with other circulatory complications: Secondary | ICD-10-CM | POA: Diagnosis not present

## 2015-10-13 DIAGNOSIS — E785 Hyperlipidemia, unspecified: Secondary | ICD-10-CM | POA: Diagnosis not present

## 2015-10-13 DIAGNOSIS — I119 Hypertensive heart disease without heart failure: Secondary | ICD-10-CM

## 2015-10-13 DIAGNOSIS — Z23 Encounter for immunization: Secondary | ICD-10-CM

## 2015-10-13 DIAGNOSIS — Z418 Encounter for other procedures for purposes other than remedying health state: Secondary | ICD-10-CM

## 2015-10-13 DIAGNOSIS — Z299 Encounter for prophylactic measures, unspecified: Secondary | ICD-10-CM

## 2015-10-13 LAB — POCT GLYCOSYLATED HEMOGLOBIN (HGB A1C): Hemoglobin A1C: 6

## 2015-10-13 NOTE — Progress Notes (Signed)
Subjective:     Patient ID: [name], female   DOB: January 24, 1951, 65 y.o.   MRN: [id number]  HPI Here for medical follow-up. She has history of CAD, type 2 diabetes, hypertension, dyslipidemia, rectal cancer, past history of depression. She was seen recently by oncology. CEA 0.9. She continues have some loose stools and is taking Lomotil for that.  Type 2 diabetes. Well controlled with insulin. No recent hypoglycemia. Last A1c 5.8%. She needs to schedule repeat eye exam  History of dyslipidemia. Last visit she was off statin and we convinced her to get back on Crestor. She is taking currently without any myalgias.  She needs flu vaccine and also no documentation of Prevnar or Pneumovax.  Past Medical History:  Diagnosis Date  .  Paroxysmal SVT (ANVRT)    S/p AV nodal ablation Dr. Lovena Le 09/28/08   . CAD    Coronary disease status post non-ST elevation MI in May 2010 with  PCI of the left circumflex on Jun 24, 2008. She then had a PCI of  the RCA on July 15, 2008. 02/24/12 Cath, Severe 95% LAD, ostial 95% circ, ostial RCA, patent stents in distal RCA and mid circ normal LV 02/26/12 CABG with LIMA to LAD, SVG to RCA, and SVG to OM Dr. Roxan Hockey    . Cancer [facility])    adenocarcinoma of rectum  . Cataract   . Depression   . Diabetes mellitus, insulin dependent (IDDM), uncontrolled ([place])   . Heart murmur   . Hyperlipidemia   . Hypertensive heart disease   . S/P radiation therapy 04/11/14-05/18/14   50.4Gy rectal   Past Surgical History:  Procedure Laterality Date  . COLONOSCOPY    . COLONOSCOPY WITH PROPOFOL N/A 07/12/2014   Procedure: COLONOSCOPY WITH PROPOFOL POSSIBLE POLYP RESECTION;  Surgeon: [name], MD;  Location: [facility];  Service: Endoscopy;  Laterality: N/A;   to be admitted after colonoscopy-robotic surgery on 6/8  . CORONARY ANGIOPLASTY WITH STENT PLACEMENT    . CORONARY ARTERY BYPASS GRAFT  02/26/2012   Roxan Hockey, MD;  Location: [facility];  Service: Open Heart Surgery;   Laterality: N/A;  CABG x three,  using left internal mammary artery  and left leg greater saphenous vein,   . ENDOVEIN HARVEST OF GREATER SAPHENOUS VEIN  02/26/2012  . EUS N/A 03/24/2014   Procedure: LOWER ENDOSCOPIC ULTRASOUND (EUS);  Surgeon: Milus Banister, MD;  Location: [facility];  Service: Endoscopy;  Laterality: N/A;  . LEFT HEART CATHETERIZATION WITH CORONARY ANGIOGRAM N/A 02/24/2012   Procedure: LEFT HEART CATHETERIZATION WITH CORONARY ANGIOGRAM;  Surgeon: Jacolyn Reedy, MD;  Location: [facility] CATH LAB;  Service: Cardiovascular;  Laterality: N/A;    reports that she has never smoked. She has never used smokeless tobacco. She reports that she does not drink alcohol or use drugs. family history includes Colon polyps in her maternal grandmother; Diabetes in her brother, father, and sister. No Known Allergies   Review of Systems  Constitutional: Negative for appetite change, fatigue and unexpected weight change.  Eyes: Negative for visual disturbance.  Respiratory: Negative for cough, chest tightness, shortness of breath and wheezing.   Cardiovascular: Negative for chest pain, palpitations and leg swelling.  Gastrointestinal: Negative for abdominal pain.  Endocrine: Negative for polydipsia and polyuria.  Genitourinary: Negative for dysuria.  Neurological: Negative for dizziness, seizures, syncope, weakness, light-headedness and headaches.       Objective:   Physical Exam  Constitutional: She appears well-developed and well-nourished. No distress.  Neck: Neck supple.  No thyromegaly present.  Cardiovascular: Normal rate and regular rhythm.   Pulmonary/Chest: Effort normal and breath sounds normal. No respiratory distress. She has no wheezes. She has no rales.  Skin: No rash noted.  Feet reveal no skin lesions. Good distal foot pulses. Good capillary refill. No calluses. Normal sensation with monofilament testing        Assessment:     #1 type 2 diabetes. History of good  control  #2 history of CAD  #3 dyslipidemia  #4 history of rectal cancer with recent normal CEA level    Plan:     -Repeat hemoglobin A1c -Flu vaccine and Prevnar 13 given. Recommended Pneumovax in 1 year -Routine follow-up in 6 months and repeat lipid panel then  Eulas Post MD [facility]

## 2015-10-13 NOTE — Progress Notes (Signed)
Pre visit review using our clinic review tool, if applicable. No additional management support is needed unless otherwise documented below in the visit note. 

## 2015-10-13 NOTE — Patient Instructions (Signed)
Set up diabetic eye exam if this has been over one year You received flu vaccine and Prevnar 13 today Recommend Pneumovax in 1 year

## 2015-11-22 ENCOUNTER — Other Ambulatory Visit: Payer: Self-pay | Admitting: Family Medicine

## 2015-11-23 NOTE — Telephone Encounter (Signed)
Refill once 

## 2015-11-24 NOTE — Telephone Encounter (Signed)
Rx printed, signed and faxed to pharmacy. 

## 2015-11-25 ENCOUNTER — Other Ambulatory Visit: Payer: Self-pay | Admitting: Family Medicine

## 2015-12-05 ENCOUNTER — Ambulatory Visit: Payer: Commercial Managed Care - HMO | Admitting: Family Medicine

## 2015-12-07 ENCOUNTER — Other Ambulatory Visit: Payer: Self-pay | Admitting: *Deleted

## 2015-12-07 DIAGNOSIS — C20 Malignant neoplasm of rectum: Secondary | ICD-10-CM

## 2015-12-08 ENCOUNTER — Ambulatory Visit (HOSPITAL_BASED_OUTPATIENT_CLINIC_OR_DEPARTMENT_OTHER): Payer: Commercial Managed Care - HMO | Admitting: Hematology & Oncology

## 2015-12-08 ENCOUNTER — Other Ambulatory Visit (HOSPITAL_BASED_OUTPATIENT_CLINIC_OR_DEPARTMENT_OTHER): Payer: Commercial Managed Care - HMO

## 2015-12-08 ENCOUNTER — Encounter: Payer: Self-pay | Admitting: Hematology & Oncology

## 2015-12-08 VITALS — BP 156/69 | HR 75 | Temp 98.6°F | Resp 16 | Ht 60.0 in | Wt 155.0 lb

## 2015-12-08 DIAGNOSIS — Z85048 Personal history of other malignant neoplasm of rectum, rectosigmoid junction, and anus: Secondary | ICD-10-CM

## 2015-12-08 DIAGNOSIS — C20 Malignant neoplasm of rectum: Secondary | ICD-10-CM

## 2015-12-08 DIAGNOSIS — E119 Type 2 diabetes mellitus without complications: Secondary | ICD-10-CM | POA: Diagnosis not present

## 2015-12-08 LAB — CBC WITH DIFFERENTIAL (CANCER CENTER ONLY)
BASO#: 0 10*3/uL (ref 0.0–0.2)
BASO%: 0.3 % (ref 0.0–2.0)
EOS ABS: 0.2 10*3/uL (ref 0.0–0.5)
EOS%: 2.4 % (ref 0.0–7.0)
HEMATOCRIT: 38.5 % (ref 34.8–46.6)
HEMOGLOBIN: 13.5 g/dL (ref 11.6–15.9)
LYMPH#: 1.1 10*3/uL (ref 0.9–3.3)
LYMPH%: 16.8 % (ref 14.0–48.0)
MCH: 32.4 pg (ref 26.0–34.0)
MCHC: 35.1 g/dL (ref 32.0–36.0)
MCV: 92 fL (ref 81–101)
MONO#: 0.6 10*3/uL (ref 0.1–0.9)
MONO%: 10.2 % (ref 0.0–13.0)
NEUT%: 70.3 % (ref 39.6–80.0)
NEUTROS ABS: 4.4 10*3/uL (ref 1.5–6.5)
Platelets: 133 10*3/uL — ABNORMAL LOW (ref 145–400)
RBC: 4.17 10*6/uL (ref 3.70–5.32)
RDW: 13.7 % (ref 11.1–15.7)
WBC: 6.3 10*3/uL (ref 3.9–10.0)

## 2015-12-08 LAB — COMPREHENSIVE METABOLIC PANEL
ALBUMIN: 3.7 g/dL (ref 3.5–5.0)
ALK PHOS: 47 U/L (ref 40–150)
ALT: 20 U/L (ref 0–55)
ANION GAP: 9 meq/L (ref 3–11)
AST: 17 U/L (ref 5–34)
BUN: 9.9 mg/dL (ref 7.0–26.0)
CALCIUM: 9.5 mg/dL (ref 8.4–10.4)
CO2: 27 mEq/L (ref 22–29)
Chloride: 105 mEq/L (ref 98–109)
Creatinine: 0.6 mg/dL (ref 0.6–1.1)
EGFR: >90 mL/min/{1.73_m2} (ref >90)
Glucose: 131 mg/dl (ref 70–140)
POTASSIUM: 4.6 meq/L (ref 3.5–5.1)
Sodium: 142 mEq/L (ref 136–145)
Total Bilirubin: 0.43 mg/dL (ref 0.20–1.20)
Total Protein: 6.9 g/dL (ref 6.4–8.3)

## 2015-12-08 NOTE — Progress Notes (Signed)
Hematology and Oncology Follow Up Visit  [name] [id number] [birth year] 65 y.o. 12/08/2015   Principle Diagnosis:  Stage IIA (T3N0M0) rectal cancer - s/p neoadjuvant chemo/radiation therapy and surgery in 07/2014 Current Therapy:    Observation     Interim History:  [name] is back for follow-up. She is still having issues with diarrhea. Unfortunately, I'm not sure what else can be done for this. We tried her on Creon. It is as outlined this worked all that well.  She is diabetic. This also is not helping.  She had about 2 feet of large intestine removed. I this is the biggest problem.  Otherwise, she seems to be doing okay. She's had no cough or shortness of breath. She's had no chest pain. She's had no nausea or vomiting. His been no urinary issues.  She's had no leg swelling. She's had no rashes.  Her CEA was saw her was only 0.9.  Overall, her performance status is ECOG 1.  Medications:  Current Outpatient Prescriptions:  .  aspirin 81 MG tablet, Take 81 mg by mouth at bedtime. , Disp: , Rfl:  .  diphenoxylate-atropine (LOMOTIL) 2.5-0.025 MG tablet, TAKE 1 TABLET BY MOUTH FOUR TIMES DAILY AS NEEDED FOR DIARRHEA OR LOOSE STOOLS, Disp: 100 tablet, Rfl: 0 .  LANTUS 100 UNIT/ML injection, INJECT 88 UNITS UNDER THE SKIN AT BEDTIME, Disp: 30 mL, Rfl: 2 .  metFORMIN (GLUCOPHAGE) 1000 MG tablet, TAKE 1 TABLET BY MOUTH TWICE DAILY WITH A MEAL, Disp: 60 tablet, Rfl: 11 .  metoprolol tartrate (LOPRESSOR) 25 MG tablet, Take 1 tablet (25 mg total) by mouth 2 (two) times daily., Disp: 60 tablet, Rfl: 11 .  NOVOLOG 100 UNIT/ML injection, INJECT 10 UNITS INTO THE SKIN THREE TIMES DAILY WITH MEALS, Disp: 10 mL, Rfl: 5 .  Pancrelipase, Lip-Prot-Amyl, (CREON) 24000 units CPEP, Take 1 capsule (24,000 Units total) by mouth 3 (three) times daily with meals., Disp: 90 capsule, Rfl: 3 .  rosuvastatin (CRESTOR) 20 MG tablet, TAKE 1 TABLET(20 MG) BY MOUTH DAILY, Disp: 90 tablet, Rfl: 3 .   sertraline (ZOLOFT) 50 MG tablet, Take 1 tablet (50 mg total) by mouth at bedtime., Disp: 90 tablet, Rfl: 3 .  zolpidem (AMBIEN) 10 MG tablet, TAKE 1 TABLET BY MOUTH EVERY DAY AT BEDTIME AS NEEDED FOR SLEEP, Disp: 15 tablet, Rfl: 0  Allergies: No Known Allergies  Past Medical History, Surgical history, Social history, and Family History were reviewed and updated.  Review of Systems:  As above  Physical Exam:  height is 5' (1.524 m) and weight is 155 lb (70.3 kg). Her oral temperature is 98.6 F (37 C). Her blood pressure is 156/69 (abnormal) and her pulse is 75. Her respiration is 16.   Wt Readings from Last 3 Encounters:  12/08/15 155 lb (70.3 kg)  10/13/15 154 lb 9.6 oz (70.1 kg)  09/01/15 151 lb (68.5 kg)      Well-developed and well-nourished white female in no obvious distress. Head and neck exam shows no ocular or oral lesions. She has no palpable cervical or supraclavicular lymph nodes. Lungs are clear. Cardiac exam regular rate and rhythm with no murmurs, rubs or bruits. Abdomen is soft. She has good bowel sounds. She has well-healed laparotomy scar. There is no fluid wave. There is no palpable liver or spleen tip. Back exam shows no tenderness over the spine, ribs or hips. Extremities shows no clubbing, cyanosis or edema. Neurological exam shows no focal neurological deficits.  Lab Results  Component  Value Date   WBC 6.3 12/08/2015   HGB 13.5 12/08/2015   HCT 38.5 12/08/2015   MCV 92 12/08/2015   PLT 133 (L) 12/08/2015     Chemistry      Component Value Date/Time   NA 140 09/01/2015 1211   K 4.3 09/01/2015 1211   CL 102 03/10/2015 1325   CO2 23 09/01/2015 1211   BUN 17.1 09/01/2015 1211   CREATININE 0.7 09/01/2015 1211      Component Value Date/Time   CALCIUM 9.6 09/01/2015 1211   ALKPHOS 39 (L) 09/01/2015 1211   AST 18 09/01/2015 1211   ALT 22 09/01/2015 1211   BILITOT 0.46 09/01/2015 1211         Impression and Plan: [name] is a 65 year old white  female. She has a history of stage IIA rectal cancer. I don't think this is an issue for [place] right now. We can follow her CEA level. I don't think she needs any scans.  I wish that there was some that we could do for her short bowel issue area and unfortunately I do still think there is anything that we can do. I'm sure that she is trying to adjust her diet.  We will plan to get her back in 4 months. I this would be reasonable.   Volanda Napoleon, MD 11/3/201712:56 PM

## 2015-12-09 LAB — CEA: CEA: 1.1 ng/mL (ref 0.0–4.7)

## 2015-12-11 LAB — CEA (IN HOUSE-CHCC): CEA (CHCC-In House): <1 ng/mL (ref 0.00–5.00)

## 2016-01-21 ENCOUNTER — Other Ambulatory Visit: Payer: Self-pay | Admitting: Family Medicine

## 2016-01-23 ENCOUNTER — Telehealth: Payer: Self-pay | Admitting: Family Medicine

## 2016-01-23 NOTE — Telephone Encounter (Signed)
Patient Name: [name] DOB: Jul 16, 1950 Initial Comment caller states she took too much medication by accident Nurse Assessment Nurse: Robby Sermon, RN, April Date/Time Eilene Ghazi Time): 01/23/2016 10:12:09 AM Confirm and document reason for call. If symptomatic, describe symptoms. ---Caller states she might have taken too much Metformin and metoprolol. She got up and "wasn't paying attention". She found her night box and took her medicine. About 45 minutes later she saw her morning box and took the medicine then remembered she took it. Both medicine boxes are empty. Does the patient have any new or worsening symptoms? ---Yes Will a triage be completed? ---Yes Related visit to physician within the last 2 weeks? ---No Does the PT have any chronic conditions? (i.e. diabetes, asthma, etc.) ---Yes List chronic conditions. ---Diabetic, hypertension Is this a behavioral health or substance abuse call? ---No Guidelines Guideline Title Affirmed Question Affirmed Notes Poisoning All OTHER POTENTIALLY HARMFUL SUBSTANCES (e.g., nearly all chemicals, plants, more than a double dose of a drug) Final Disposition User Call [facility] [place], [place], April Comments Metoprolol is 25 mg Metformin 500 mg Disagree/Comply: Comply

## 2016-01-23 NOTE — Telephone Encounter (Signed)
LMTCB

## 2016-01-25 NOTE — Telephone Encounter (Signed)
LMTCB

## 2016-01-30 ENCOUNTER — Other Ambulatory Visit: Payer: Self-pay | Admitting: Family Medicine

## 2016-02-12 ENCOUNTER — Other Ambulatory Visit: Payer: Self-pay | Admitting: Hematology & Oncology

## 2016-02-12 DIAGNOSIS — C20 Malignant neoplasm of rectum: Secondary | ICD-10-CM

## 2016-02-12 DIAGNOSIS — K8689 Other specified diseases of pancreas: Secondary | ICD-10-CM

## 2016-02-23 ENCOUNTER — Telehealth: Payer: Self-pay | Admitting: Family Medicine

## 2016-02-23 NOTE — Telephone Encounter (Signed)
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  Pt states with her heath issues of Short Bowel Syndrome, and not hearing well, she would like a letter to excuse her from jury duty.  Pt will be in town on Tuesday 02/27/2016 and would to pick up then if possible.  [place] No: [id number]

## 2016-02-23 NOTE — Telephone Encounter (Signed)
Okay for letter

## 2016-02-27 ENCOUNTER — Encounter: Payer: Self-pay | Admitting: Family Medicine

## 2016-02-27 DIAGNOSIS — I10 Essential (primary) hypertension: Secondary | ICD-10-CM | POA: Diagnosis not present

## 2016-02-27 DIAGNOSIS — I2510 Atherosclerotic heart disease of native coronary artery without angina pectoris: Secondary | ICD-10-CM | POA: Diagnosis not present

## 2016-02-27 DIAGNOSIS — E785 Hyperlipidemia, unspecified: Secondary | ICD-10-CM | POA: Diagnosis not present

## 2016-02-27 NOTE — Telephone Encounter (Signed)
Pt is aware that letter is up front for pick up.

## 2016-02-27 NOTE — Telephone Encounter (Signed)
Letter done

## 2016-02-29 ENCOUNTER — Other Ambulatory Visit: Payer: Self-pay | Admitting: Family Medicine

## 2016-03-01 ENCOUNTER — Other Ambulatory Visit: Payer: Self-pay | Admitting: Family Medicine

## 2016-03-01 ENCOUNTER — Other Ambulatory Visit: Payer: Self-pay | Admitting: Hematology & Oncology

## 2016-03-07 DIAGNOSIS — Z7984 Long term (current) use of oral hypoglycemic drugs: Secondary | ICD-10-CM | POA: Diagnosis not present

## 2016-03-07 DIAGNOSIS — E113393 Type 2 diabetes mellitus with moderate nonproliferative diabetic retinopathy without macular edema, bilateral: Secondary | ICD-10-CM | POA: Diagnosis not present

## 2016-03-07 DIAGNOSIS — H43393 Other vitreous opacities, bilateral: Secondary | ICD-10-CM | POA: Diagnosis not present

## 2016-03-07 DIAGNOSIS — H52223 Regular astigmatism, bilateral: Secondary | ICD-10-CM | POA: Diagnosis not present

## 2016-03-07 DIAGNOSIS — H353131 Nonexudative age-related macular degeneration, bilateral, early dry stage: Secondary | ICD-10-CM | POA: Diagnosis not present

## 2016-03-07 DIAGNOSIS — Z0100 Encounter for examination of eyes and vision without abnormal findings: Secondary | ICD-10-CM | POA: Diagnosis not present

## 2016-03-22 ENCOUNTER — Other Ambulatory Visit: Payer: Self-pay | Admitting: Family Medicine

## 2016-03-22 ENCOUNTER — Other Ambulatory Visit: Payer: Self-pay | Admitting: Hematology & Oncology

## 2016-03-22 DIAGNOSIS — C20 Malignant neoplasm of rectum: Secondary | ICD-10-CM

## 2016-03-22 DIAGNOSIS — K8689 Other specified diseases of pancreas: Secondary | ICD-10-CM

## 2016-03-25 ENCOUNTER — Telehealth: Payer: Self-pay | Admitting: Hematology & Oncology

## 2016-03-25 NOTE — Telephone Encounter (Signed)
Faxed office notes to [place]

## 2016-04-05 ENCOUNTER — Other Ambulatory Visit: Payer: Medicare HMO

## 2016-04-05 ENCOUNTER — Ambulatory Visit: Payer: Medicare HMO | Admitting: Family

## 2016-04-12 ENCOUNTER — Ambulatory Visit (INDEPENDENT_AMBULATORY_CARE_PROVIDER_SITE_OTHER): Payer: Medicare HMO | Admitting: Family Medicine

## 2016-04-12 ENCOUNTER — Ambulatory Visit (HOSPITAL_BASED_OUTPATIENT_CLINIC_OR_DEPARTMENT_OTHER): Payer: Medicare HMO | Admitting: Hematology & Oncology

## 2016-04-12 ENCOUNTER — Encounter: Payer: Self-pay | Admitting: Family Medicine

## 2016-04-12 ENCOUNTER — Other Ambulatory Visit (HOSPITAL_BASED_OUTPATIENT_CLINIC_OR_DEPARTMENT_OTHER): Payer: Medicare HMO

## 2016-04-12 VITALS — BP 136/67 | HR 67 | Temp 98.0°F | Wt 155.0 lb

## 2016-04-12 VITALS — BP 140/70 | HR 68 | Temp 98.9°F | Wt 153.0 lb

## 2016-04-12 DIAGNOSIS — C20 Malignant neoplasm of rectum: Secondary | ICD-10-CM | POA: Diagnosis not present

## 2016-04-12 DIAGNOSIS — R5383 Other fatigue: Secondary | ICD-10-CM

## 2016-04-12 DIAGNOSIS — Z85048 Personal history of other malignant neoplasm of rectum, rectosigmoid junction, and anus: Secondary | ICD-10-CM | POA: Diagnosis not present

## 2016-04-12 DIAGNOSIS — R32 Unspecified urinary incontinence: Secondary | ICD-10-CM | POA: Diagnosis not present

## 2016-04-12 DIAGNOSIS — M791 Myalgia, unspecified site: Secondary | ICD-10-CM

## 2016-04-12 DIAGNOSIS — E1159 Type 2 diabetes mellitus with other circulatory complications: Secondary | ICD-10-CM

## 2016-04-12 DIAGNOSIS — E119 Type 2 diabetes mellitus without complications: Secondary | ICD-10-CM | POA: Diagnosis not present

## 2016-04-12 DIAGNOSIS — M8000XA Age-related osteoporosis with current pathological fracture, unspecified site, initial encounter for fracture: Secondary | ICD-10-CM

## 2016-04-12 DIAGNOSIS — E785 Hyperlipidemia, unspecified: Secondary | ICD-10-CM | POA: Diagnosis not present

## 2016-04-12 DIAGNOSIS — D696 Thrombocytopenia, unspecified: Secondary | ICD-10-CM

## 2016-04-12 DIAGNOSIS — D508 Other iron deficiency anemias: Secondary | ICD-10-CM

## 2016-04-12 DIAGNOSIS — I119 Hypertensive heart disease without heart failure: Secondary | ICD-10-CM

## 2016-04-12 DIAGNOSIS — D510 Vitamin B12 deficiency anemia due to intrinsic factor deficiency: Secondary | ICD-10-CM

## 2016-04-12 LAB — LIPID PANEL
CHOL/HDL RATIO: 3
Cholesterol: 96 mg/dL (ref 0–200)
HDL: 31 mg/dL — AB (ref >39.00)
LDL CALC: 35 mg/dL (ref 0–99)
NonHDL: 65.39
TRIGLYCERIDES: 151 mg/dL — AB (ref 0.0–149.0)
VLDL: 30.2 mg/dL (ref 0.0–40.0)

## 2016-04-12 LAB — CBC WITH DIFFERENTIAL (CANCER CENTER ONLY)
BASO#: 0 10*3/uL (ref 0.0–0.2)
BASO%: 0.4 % (ref 0.0–2.0)
EOS%: 1.7 % (ref 0.0–7.0)
Eosinophils Absolute: 0.1 10*3/uL (ref 0.0–0.5)
HEMATOCRIT: 33.1 % — AB (ref 34.8–46.6)
HGB: 11.2 g/dL — ABNORMAL LOW (ref 11.6–15.9)
LYMPH#: 0.7 10*3/uL — ABNORMAL LOW (ref 0.9–3.3)
LYMPH%: 12.9 % — ABNORMAL LOW (ref 14.0–48.0)
MCH: 31.7 pg (ref 26.0–34.0)
MCHC: 33.8 g/dL (ref 32.0–36.0)
MCV: 94 fL (ref 81–101)
MONO#: 0.4 10*3/uL (ref 0.1–0.9)
MONO%: 6.8 % (ref 0.0–13.0)
NEUT%: 78.2 % (ref 39.6–80.0)
NEUTROS ABS: 4.3 10*3/uL (ref 1.5–6.5)
Platelets: 125 10*3/uL — ABNORMAL LOW (ref 145–400)
RBC: 3.53 10*6/uL — ABNORMAL LOW (ref 3.70–5.32)
RDW: 14.5 % (ref 11.1–15.7)
WBC: 5.4 10*3/uL (ref 3.9–10.0)

## 2016-04-12 LAB — COMPREHENSIVE METABOLIC PANEL (CC13)
A/G RATIO: 1.5 (ref 1.2–2.2)
ALT: 24 IU/L (ref 0–32)
AST: 19 IU/L (ref 0–40)
Albumin, Serum: 3.8 g/dL (ref 3.6–4.8)
Alkaline Phosphatase, S: 46 IU/L (ref 39–117)
BUN/Creatinine Ratio: 21 (ref 12–28)
BUN: 16 mg/dL (ref 8–27)
Bilirubin Total: 0.2 mg/dL (ref 0.0–1.2)
CALCIUM: 9.8 mg/dL (ref 8.7–10.3)
Carbon Dioxide, Total: 26 mmol/L (ref 18–29)
Chloride, Ser: 103 mmol/L (ref 96–106)
Creatinine, Ser: 0.75 mg/dL (ref 0.57–1.00)
GFR calc Af Amer: 95 mL/min/{1.73_m2} (ref >59)
GFR, EST NON AFRICAN AMERICAN: 83 mL/min/{1.73_m2} (ref >59)
Globulin, Total: 2.6 g/dL (ref 1.5–4.5)
Glucose: 110 mg/dL — ABNORMAL HIGH (ref 65–99)
POTASSIUM: 4.4 mmol/L (ref 3.5–5.2)
Sodium: 140 mmol/L (ref 134–144)
Total Protein: 6.4 g/dL (ref 6.0–8.5)

## 2016-04-12 LAB — CBC WITH DIFFERENTIAL/PLATELET
BASOS ABS: 0 10*3/uL (ref 0.0–0.1)
BASOS PCT: 0.3 % (ref 0.0–3.0)
EOS ABS: 0.1 10*3/uL (ref 0.0–0.7)
Eosinophils Relative: 1.3 % (ref 0.0–5.0)
HEMATOCRIT: 31.4 % — AB (ref 36.0–46.0)
Hemoglobin: 11 g/dL — ABNORMAL LOW (ref 12.0–15.0)
Lymphocytes Relative: 13 % (ref 12.0–46.0)
Lymphs Abs: 0.6 10*3/uL — ABNORMAL LOW (ref 0.7–4.0)
MCHC: 35.1 g/dL (ref 30.0–36.0)
MCV: 91 fl (ref 78.0–100.0)
Monocytes Absolute: 0.3 10*3/uL (ref 0.1–1.0)
Monocytes Relative: 7 % (ref 3.0–12.0)
NEUTROS ABS: 3.8 10*3/uL (ref 1.4–7.7)
Neutrophils Relative %: 78.4 % — ABNORMAL HIGH (ref 43.0–77.0)
PLATELETS: 122 10*3/uL — AB (ref 150.0–400.0)
RBC: 3.45 Mil/uL — ABNORMAL LOW (ref 3.87–5.11)
RDW: 14.9 % (ref 11.5–15.5)
WBC: 4.9 10*3/uL (ref 4.0–10.5)

## 2016-04-12 LAB — BASIC METABOLIC PANEL
BUN: 18 mg/dL (ref 6–23)
CHLORIDE: 102 meq/L (ref 96–112)
CO2: 30 meq/L (ref 19–32)
Calcium: 9.5 mg/dL (ref 8.4–10.5)
Creatinine, Ser: 0.79 mg/dL (ref 0.40–1.20)
GFR: 77.14 mL/min (ref >60.00)
Glucose, Bld: 151 mg/dL — ABNORMAL HIGH (ref 70–99)
POTASSIUM: 4.1 meq/L (ref 3.5–5.1)
SODIUM: 142 meq/L (ref 135–145)

## 2016-04-12 LAB — HEPATIC FUNCTION PANEL
ALBUMIN: 3.7 g/dL (ref 3.5–5.2)
ALK PHOS: 41 U/L (ref 39–117)
ALT: 23 U/L (ref 0–35)
AST: 19 U/L (ref 0–37)
BILIRUBIN DIRECT: 0.1 mg/dL (ref 0.0–0.3)
BILIRUBIN TOTAL: 0.4 mg/dL (ref 0.2–1.2)
Total Protein: 6.2 g/dL (ref 6.0–8.3)

## 2016-04-12 LAB — HEMOGLOBIN A1C: Hgb A1c MFr Bld: 6.1 % (ref 4.6–6.5)

## 2016-04-12 LAB — SEDIMENTATION RATE: SED RATE: 13 mm/h (ref 0–30)

## 2016-04-12 LAB — CK: Total CK: 84 U/L (ref 7–177)

## 2016-04-12 LAB — TSH: TSH: 0.55 u[IU]/mL (ref 0.35–4.50)

## 2016-04-12 NOTE — Progress Notes (Signed)
Subjective:     Patient ID: [name], female   DOB: [birth year], 66 y.o.   MRN: [id number]  HPI Here for medical follow-up  She has relatively new complaint of progressive fatigue and fairly severe myalgias over the past couple months. She first noted near the end of January. She noticed myalgias especially involving her neck and upper back and thighs. She's not had any change in appetite. No reported fevers or chills. She has multiple chronic problems including history of CAD, colorectal cancer, type 2 diabetes, dyslipidemia, history of thrombocytopenia.  Last A1c 6.0%. No reported hypoglycemia. No log of blood sugars for review today. She's had close follow-up with oncology regarding her colon cancer and CEA levels have been stable. Does take Crestor for dyslipidemia and history of CAD but has not had myalgias with that in the past.  Past Medical History:  Diagnosis Date  .  Paroxysmal SVT (ANVRT)    S/p AV nodal ablation Dr. Lovena Le 09/28/08   . CAD    Coronary disease status post non-ST elevation MI in May 2010 with  PCI of the left circumflex on Jun 24, 2008. She then had a PCI of  the RCA on July 15, 2008. 02/24/12 Cath, Severe 95% LAD, ostial 95% circ, ostial RCA, patent stents in distal RCA and mid circ normal LV 02/26/12 CABG with LIMA to LAD, SVG to RCA, and SVG to OM Dr. Roxan Hockey    . Cancer [facility])    adenocarcinoma of rectum  . Cataract   . Depression   . Diabetes mellitus, insulin dependent (IDDM), uncontrolled ([place])   . Heart murmur   . Hyperlipidemia   . Hypertensive heart disease   . S/P radiation therapy 04/11/14-05/18/14   50.4Gy rectal   Past Surgical History:  Procedure Laterality Date  . COLONOSCOPY    . COLONOSCOPY WITH PROPOFOL N/A 07/12/2014   Procedure: COLONOSCOPY WITH PROPOFOL POSSIBLE POLYP RESECTION;  Surgeon: [name], MD;  Location: [facility];  Service: Endoscopy;  Laterality: N/A;   to be admitted after colonoscopy-robotic surgery on 6/8  . CORONARY  ANGIOPLASTY WITH STENT PLACEMENT    . CORONARY ARTERY BYPASS GRAFT  02/26/2012   Roxan Hockey, MD;  Location: [place];  Service: Open Heart Surgery;  Laterality: N/A;  CABG x three,  using left internal mammary artery  and left leg greater saphenous vein,   . ENDOVEIN HARVEST OF GREATER SAPHENOUS VEIN  02/26/2012  . EUS N/A 03/24/2014   Procedure: LOWER ENDOSCOPIC ULTRASOUND (EUS);  Surgeon: Milus Banister, MD;  Location: [facility];  Service: Endoscopy;  Laterality: N/A;  . LEFT HEART CATHETERIZATION WITH CORONARY ANGIOGRAM N/A 02/24/2012   Procedure: LEFT HEART CATHETERIZATION WITH CORONARY ANGIOGRAM;  Surgeon: Jacolyn Reedy, MD;  Location: [facility] CATH LAB;  Service: Cardiovascular;  Laterality: N/A;    reports that she has never smoked. She has never used smokeless tobacco. She reports that she does not drink alcohol or use drugs. family history includes Colon polyps in her maternal grandmother; Diabetes in her brother, father, and sister. No Known Allergies     Review of Systems  Constitutional: Positive for fatigue. Negative for appetite change, chills, fever and unexpected weight change.  Eyes: Negative for visual disturbance.  Respiratory: Negative for cough, chest tightness, shortness of breath and wheezing.   Cardiovascular: Negative for chest pain, palpitations and leg swelling.  Gastrointestinal: Negative for abdominal pain.  Genitourinary: Negative for dysuria.  Musculoskeletal: Positive for myalgias.  Neurological: Negative for dizziness, seizures, syncope, weakness, light-headedness  and headaches.       Objective:   Physical Exam  Constitutional: She appears well-developed and well-nourished.  Cardiovascular: Normal rate and regular rhythm.   Pulmonary/Chest: Effort normal and breath sounds normal. No respiratory distress. She has no wheezes. She has no rales.  Musculoskeletal: She exhibits no edema.  Skin: No rash noted.  Feet reveal no skin lesions. Good distal foot  pulses. Good capillary refill. No calluses. Normal sensation with monofilament testing        Assessment:     # 1 myalgia. Differential was statin related versus polymyalgia rheumatica versus other. Rule out hypothyroidism.  #2 type 2 diabetes on insulin with history of good control recently  #3 dyslipidemia  #4 history of CAD  #5 history of colorectal cancer    Plan:     -Check labs today with TSH, creatinine kinase, sedimentation rate, CBC, basic metabolic panel, hepatic panel, and hemoglobin A1c -Plan routine follow-up in 6 months and sooner as needed based on lab work results above  Eulas Post MD [facility] at [facility]

## 2016-04-12 NOTE — Progress Notes (Signed)
Hematology and Oncology Follow Up Visit  [name] [id number] [birth year] 66 y.o. 04/12/2016   Principle Diagnosis:  Stage IIA (T3N0M0) rectal cancer - s/p neoadjuvant chemo/radiation therapy and surgery in 07/2014 Current Therapy:    Observation     Interim History:  [name] is back for follow-up. She has some new issues going on. For the past couple months, she's had some issues with her urine. She says she's had some incontinence.  She also has had some arthralgias and myalgias. She is on a statin drug. I think maybe her family doctor will discontinue this. Perhaps she does have a lot of fatigue. She does not note any obvious bleeding.  She has not had a mammogram for a couple years. I need to get this set up for her.  Her blood sugars she says are doing well. She's had her last hemoglobin A1c was 6.  She has not had as bad diarrhea. She is on Creon. She also is on Lomotil.   She has had no problems with fever. She has had no issues with infections. She has had no issues with influenza.   There has been no leg swelling. She has had no rashes.   Overall, her performance status is ECOG 1.  Medications:  Current Outpatient Prescriptions:  .  aspirin 81 MG tablet, Take 81 mg by mouth at bedtime. , Disp: , Rfl:  .  CREON 24000-76000 units CPEP, TAKE 1 CAPSULE BY MOUTH THREE TIMES DAILY WITH MEALS (Patient taking differently: BID), Disp: 90 capsule, Rfl: 0 .  diphenoxylate-atropine (LOMOTIL) 2.5-0.025 MG tablet, TAKE 1 TABLET BY MOUTH FOUR TIMES DAILY AS NEEDED FOR DIARRHEA OR LOOSE STOOLS (Patient taking differently: TAKE 1 TABLET BY MOUTH two TIMES DAILY AS NEEDED FOR DIARRHEA OR LOOSE STOOLS), Disp: 100 tablet, Rfl: 0 .  LANTUS 100 UNIT/ML injection, INJECT 96 UNITS SUBCUTAENOUSLY EVERY NIGHT AT BEDTIME, Disp: 10 mL, Rfl: 2 .  metFORMIN (GLUCOPHAGE) 1000 MG tablet, TAKE 1 TABLET BY MOUTH TWICE DAILY WITH A MEAL, Disp: 60 tablet, Rfl: 11 .  metoprolol tartrate (LOPRESSOR) 25  MG tablet, TAKE 1 TABLET BY MOUTH TWICE DAILY, Disp: 60 tablet, Rfl: 5 .  NOVOLOG 100 UNIT/ML injection, INJECT 10 UNITS INTO THE SKIN THREE TIMES DAILY WITH MEALS (Patient taking differently: INJECT 10 UNITS INTO THE SKIN PRN WITH MEALS), Disp: 10 mL, Rfl: 5 .  rosuvastatin (CRESTOR) 20 MG tablet, TAKE 1 TABLET(20 MG) BY MOUTH DAILY, Disp: 90 tablet, Rfl: 3 .  sertraline (ZOLOFT) 50 MG tablet, TAKE 1 TABLET(50MG  TOTAL) BY MOUTH AT BEDTIME, Disp: 90 tablet, Rfl: 2 .  zolpidem (AMBIEN) 10 MG tablet, TAKE 1 TABLET BY MOUTH EVERY DAY AT BEDTIME AS NEEDED FOR SLEEP (Patient taking differently: TAKE 1/2 to 1 TABLET BY MOUTH PRN AT BEDTIME AS NEEDED FOR SLEEP), Disp: 15 tablet, Rfl: 0  Allergies: No Known Allergies  Past Medical History, Surgical history, Social history, and Family History were reviewed and updated.  Review of Systems:  As above  Physical Exam:  weight is 155 lb (70.3 kg). Her oral temperature is 98 F (36.7 C). Her blood pressure is 136/67 and her pulse is 67.   Wt Readings from Last 3 Encounters:  04/12/16 155 lb (70.3 kg)  04/12/16 153 lb (69.4 kg)  12/08/15 155 lb (70.3 kg)      Well-developed and well-nourished white female in no obvious distress. Head and neck exam shows no ocular or oral lesions. She has no palpable cervical or supraclavicular lymph  nodes. Lungs are clear. Cardiac exam regular rate and rhythm with no murmurs, rubs or bruits. Abdomen is soft. She has good bowel sounds. She has well-healed laparotomy scar. There is no fluid wave. There is no palpable liver or spleen tip. Back exam shows no tenderness over the spine, ribs or hips. Extremities shows no clubbing, cyanosis or edema. Neurological exam shows no focal neurological deficits.  Lab Results  Component Value Date   WBC 5.4 04/12/2016   HGB 11.2 (L) 04/12/2016   HCT 33.1 (L) 04/12/2016   MCV 94 04/12/2016   PLT 125 (L) 04/12/2016     Chemistry      Component Value Date/Time   NA 142 04/12/2016  1400   NA 142 12/08/2015 1145   K 4.1 04/12/2016 1400   K 4.6 12/08/2015 1145   CL 102 04/12/2016 1400   CO2 30 04/12/2016 1400   CO2 27 12/08/2015 1145   BUN 18 04/12/2016 1400   BUN 9.9 12/08/2015 1145   CREATININE 0.79 04/12/2016 1400   CREATININE 0.6 12/08/2015 1145      Component Value Date/Time   CALCIUM 9.5 04/12/2016 1400   CALCIUM 9.5 12/08/2015 1145   ALKPHOS 41 04/12/2016 1400   ALKPHOS 47 12/08/2015 1145   AST 19 04/12/2016 1400   AST 17 12/08/2015 1145   ALT 23 04/12/2016 1400   ALT 20 12/08/2015 1145   BILITOT 0.4 04/12/2016 1400   BILITOT 0.43 12/08/2015 1145         Impression and Plan: [name] is a 66 year old white female. She has a history of stage IIA rectal cancer. I don't think this is an issue for [place] right now. We can follow her CEA level. I don't think she needs any scans.  I And not sure why she's having all these problems. I probably would do some lab work on her. She has a diabetes. She may be at risk for hypothyroidism. She may be also at risk for pernicious anemia. I will check her iron studies. I will try to get all these next week.   I will see her back in 6 weeks. Hopefully, if she is feeling better, that we will get her set up for routine follow-up.   I spent about 25 minutes with her.    Volanda Napoleon, MD 3/9/20184:44 PM

## 2016-04-12 NOTE — Progress Notes (Signed)
Pre visit review using our clinic review tool, if applicable. No additional management support is needed unless otherwise documented below in the visit note. 

## 2016-04-13 LAB — CEA: CEA: 0.9 ng/mL (ref 0.0–4.7)

## 2016-04-15 LAB — LACTATE DEHYDROGENASE: LDH: 204 U/L (ref 125–245)

## 2016-04-15 LAB — CEA (IN HOUSE-CHCC): CEA (CHCC-In House): <1 ng/mL (ref 0.00–5.00)

## 2016-04-19 ENCOUNTER — Other Ambulatory Visit: Payer: Self-pay | Admitting: *Deleted

## 2016-04-20 ENCOUNTER — Other Ambulatory Visit: Payer: Self-pay | Admitting: Hematology & Oncology

## 2016-04-22 ENCOUNTER — Other Ambulatory Visit (HOSPITAL_BASED_OUTPATIENT_CLINIC_OR_DEPARTMENT_OTHER): Payer: Medicare HMO

## 2016-04-22 DIAGNOSIS — D510 Vitamin B12 deficiency anemia due to intrinsic factor deficiency: Secondary | ICD-10-CM | POA: Diagnosis not present

## 2016-04-22 DIAGNOSIS — E119 Type 2 diabetes mellitus without complications: Secondary | ICD-10-CM | POA: Diagnosis not present

## 2016-04-22 DIAGNOSIS — Z85048 Personal history of other malignant neoplasm of rectum, rectosigmoid junction, and anus: Secondary | ICD-10-CM | POA: Diagnosis not present

## 2016-04-22 DIAGNOSIS — M8000XA Age-related osteoporosis with current pathological fracture, unspecified site, initial encounter for fracture: Secondary | ICD-10-CM

## 2016-04-22 DIAGNOSIS — D508 Other iron deficiency anemias: Secondary | ICD-10-CM

## 2016-04-22 DIAGNOSIS — R5383 Other fatigue: Secondary | ICD-10-CM | POA: Diagnosis not present

## 2016-04-22 LAB — TSH: TSH: 0.489 m(IU)/L (ref 0.308–3.960)

## 2016-04-22 LAB — FERRITIN: Ferritin: 31 ng/ml (ref 9–269)

## 2016-04-23 LAB — VITAMIN D 25 HYDROXY (VIT D DEFICIENCY, FRACTURES): VIT D 25 HYDROXY: 11.8 ng/mL — AB (ref 30.0–100.0)

## 2016-04-23 LAB — VITAMIN B12: Vitamin B12: 364 pg/mL (ref 232–1245)

## 2016-04-24 ENCOUNTER — Telehealth: Payer: Self-pay | Admitting: *Deleted

## 2016-04-24 NOTE — Telephone Encounter (Addendum)
Patient aware of results and new prescription.   ----- Message from Volanda Napoleon, MD sent at 04/23/2016 11:50 AM EDT ----- Call - Vit D level is really low!!  She needs 50000 units q week.  Please call this in!!!  pete

## 2016-04-25 MED ORDER — ERGOCALCIFEROL 1.25 MG (50000 UT) PO CAPS: 50000.0000 [IU] | ORAL_CAPSULE | ORAL | 3 refills | Status: DC

## 2016-05-07 ENCOUNTER — Encounter: Payer: Self-pay | Admitting: Family Medicine

## 2016-05-07 ENCOUNTER — Ambulatory Visit (INDEPENDENT_AMBULATORY_CARE_PROVIDER_SITE_OTHER): Payer: Medicare HMO | Admitting: Family Medicine

## 2016-05-07 VITALS — BP 120/70 | HR 77 | Temp 98.2°F | Wt 155.2 lb

## 2016-05-07 DIAGNOSIS — M791 Myalgia, unspecified site: Secondary | ICD-10-CM

## 2016-05-07 DIAGNOSIS — E559 Vitamin D deficiency, unspecified: Secondary | ICD-10-CM

## 2016-05-07 DIAGNOSIS — E1159 Type 2 diabetes mellitus with other circulatory complications: Secondary | ICD-10-CM | POA: Diagnosis not present

## 2016-05-07 DIAGNOSIS — D649 Anemia, unspecified: Secondary | ICD-10-CM

## 2016-05-07 NOTE — Patient Instructions (Signed)
Let's plan on 3 month follow up  And will get follow up labs then with A1C, Vit D level, and repeat CBC then Follow up for any obvious bleeding concerns Stay on the Vit D until follow up.

## 2016-05-07 NOTE — Progress Notes (Signed)
Pre visit review using our clinic review tool, if applicable. No additional management support is needed unless otherwise documented below in the visit note. 

## 2016-05-07 NOTE — Progress Notes (Signed)
Subjective:     Patient ID: [name], female   DOB: May 02, 1950, 66 y.o.   MRN: [id number]  HPI Patient seen recently with fatigue and myalgias. We considered possible statin related and less likely polymyalgia rheumatica. We obtained multiple labs including TSH, creatinine kinase, sedimentation rate, CBC, basic metabolic panel, hepatic panel, and hemoglobin A1c. Her labs were basically all stable with exception of hemoglobin 11.0 which is a drop from previous of 13.5. Normocytic indices. She has history of rectal cancer but not noted any recent rectal bleeding.  She went to hematologist and had repeat labs with hemoglobin stable 11.2. CEA level normal. Ferritin normal. B12 normal. Vitamin D 11.8. Patient was started on high-dose vitamin D supplement 50,000 international units once weekly. She states her myalgias are better this time and she has less fatigue. Recent A1c 6.1%.  Past Medical History:  Diagnosis Date  .  Paroxysmal SVT (ANVRT)    S/p AV nodal ablation Dr. Lovena Le 09/28/08   . CAD    Coronary disease status post non-ST elevation MI in May 2010 with  PCI of the left circumflex on Jun 24, 2008. She then had a PCI of  the RCA on July 15, 2008. 02/24/12 Cath, Severe 95% LAD, ostial 95% circ, ostial RCA, patent stents in distal RCA and mid circ normal LV 02/26/12 CABG with LIMA to LAD, SVG to RCA, and SVG to OM Dr. Roxan Hockey    . Cancer [facility])    adenocarcinoma of rectum  . Cataract   . Depression   . Diabetes mellitus, insulin dependent (IDDM), uncontrolled ([place])   . Heart murmur   . Hyperlipidemia   . Hypertensive heart disease   . S/P radiation therapy 04/11/14-05/18/14   50.4Gy rectal   Past Surgical History:  Procedure Laterality Date  . COLONOSCOPY    . COLONOSCOPY WITH PROPOFOL N/A 07/12/2014   Procedure: COLONOSCOPY WITH PROPOFOL POSSIBLE POLYP RESECTION;  Surgeon: [name], MD;  Location: [facility];  Service: Endoscopy;  Laterality: N/A;   to be admitted after  colonoscopy-robotic surgery on 6/8  . CORONARY ANGIOPLASTY WITH STENT PLACEMENT    . CORONARY ARTERY BYPASS GRAFT  02/26/2012   Roxan Hockey, MD;  Location: [place];  Service: Open Heart Surgery;  Laterality: N/A;  CABG x three,  using left internal mammary artery  and left leg greater saphenous vein,   . ENDOVEIN HARVEST OF GREATER SAPHENOUS VEIN  02/26/2012  . EUS N/A 03/24/2014   Procedure: LOWER ENDOSCOPIC ULTRASOUND (EUS);  Surgeon: Milus Banister, MD;  Location: [facility];  Service: Endoscopy;  Laterality: N/A;  . LEFT HEART CATHETERIZATION WITH CORONARY ANGIOGRAM N/A 02/24/2012   Procedure: LEFT HEART CATHETERIZATION WITH CORONARY ANGIOGRAM;  Surgeon: Jacolyn Reedy, MD;  Location[facility] CATH LAB;  Service: Cardiovascular;  Laterality: N/A;    reports that she has never smoked. She has never used smokeless tobacco. She reports that she does not drink alcohol or use drugs. family history includes Colon polyps in her maternal grandmother; Diabetes in her brother, father, and sister. No Known Allergies   Review of Systems  Constitutional: Negative for chills, fatigue and unexpected weight change.  Eyes: Negative for visual disturbance.  Respiratory: Negative for cough, chest tightness, shortness of breath and wheezing.   Cardiovascular: Negative for chest pain, palpitations and leg swelling.  Gastrointestinal: Negative for abdominal pain.  Endocrine: Negative for polydipsia and polyuria.  Neurological: Negative for dizziness, seizures, syncope, weakness, light-headedness and headaches.       Objective:  Physical Exam  Constitutional: She is oriented to person, place, and time. She appears well-developed and well-nourished.  Neck: Neck supple. No thyromegaly present.  Cardiovascular: Normal rate and regular rhythm.   Pulmonary/Chest: Effort normal and breath sounds normal. No respiratory distress. She has no wheezes. She has no rales.  Musculoskeletal: She exhibits no edema.   Lymphadenopathy:    She has no cervical adenopathy.  Neurological: She is alert and oriented to person, place, and time.       Assessment:     #1 recent myalgias. No evidence for polymyalgia rheumatica. She did have low vitamin D and symptoms appear to be improving on vitamin D replacement. Doubt related to statin as her symptoms are improving  #2 normocytic anemia. Recent iron studies and B12 normal.  #3 type 2 diabetes on insulin well controlled    Plan:     -Continue with vitamin D supplement -Follow-up in 3 months and recheck CBC, hemoglobin A1c, 25-hydroxy vitamin D level. -Continue current medications for now -Follow-up immediately for any rectal bleeding or other concerns.  Eulas Post MD [facility] at [facility]

## 2016-05-18 ENCOUNTER — Other Ambulatory Visit: Payer: Self-pay | Admitting: Family Medicine

## 2016-05-18 ENCOUNTER — Other Ambulatory Visit: Payer: Self-pay | Admitting: Hematology & Oncology

## 2016-05-18 DIAGNOSIS — C20 Malignant neoplasm of rectum: Secondary | ICD-10-CM

## 2016-05-18 DIAGNOSIS — K8689 Other specified diseases of pancreas: Secondary | ICD-10-CM

## 2016-05-20 ENCOUNTER — Telehealth: Payer: Self-pay

## 2016-05-20 NOTE — Telephone Encounter (Signed)
zolpidem (AMBIEN) 10 MG tablet.  Last refill 11/24/15.  Last office visit 05/07/16.  Okay to fill?

## 2016-05-20 NOTE — Telephone Encounter (Signed)
Refill once.  Avoid regular use. 

## 2016-05-20 NOTE — Telephone Encounter (Signed)
Received VM from pt stating that she had blood in stool over the weekend.   Upon return call, pt reports noting "2 in diameter" bright red blood in underwear this weekend. Pt did not notice blood in toilet or during stool this weekend. Pt is adamant this in not vaginal bleeding "because I am too old for that." Pt educated that dysfunctional uterine bleeding can occur if female organs are in place. Reports only one episode two days ago with no evidence of bleeding since.  Per Dr Marin Olp, continue to monitor. Pt can contact ob/gyn if symptoms reoccur. dph

## 2016-05-23 ENCOUNTER — Ambulatory Visit (HOSPITAL_BASED_OUTPATIENT_CLINIC_OR_DEPARTMENT_OTHER)
Admission: RE | Admit: 2016-05-23 | Discharge: 2016-05-23 | Disposition: A | Discharge status: Home / Self Care | Payer: Medicare HMO | Source: Ambulatory Visit | Attending: Hematology & Oncology | Admitting: Hematology & Oncology

## 2016-05-23 ENCOUNTER — Encounter (HOSPITAL_BASED_OUTPATIENT_CLINIC_OR_DEPARTMENT_OTHER): Payer: Self-pay

## 2016-05-23 DIAGNOSIS — Z1231 Encounter for screening mammogram for malignant neoplasm of breast: Secondary | ICD-10-CM | POA: Diagnosis not present

## 2016-05-23 DIAGNOSIS — D510 Vitamin B12 deficiency anemia due to intrinsic factor deficiency: Secondary | ICD-10-CM

## 2016-05-23 DIAGNOSIS — D508 Other iron deficiency anemias: Secondary | ICD-10-CM

## 2016-05-23 DIAGNOSIS — M8000XA Age-related osteoporosis with current pathological fracture, unspecified site, initial encounter for fracture: Secondary | ICD-10-CM

## 2016-05-27 ENCOUNTER — Other Ambulatory Visit: Payer: Self-pay | Admitting: *Deleted

## 2016-05-27 DIAGNOSIS — C20 Malignant neoplasm of rectum: Secondary | ICD-10-CM

## 2016-05-28 ENCOUNTER — Ambulatory Visit (HOSPITAL_BASED_OUTPATIENT_CLINIC_OR_DEPARTMENT_OTHER): Payer: Medicare HMO | Admitting: Hematology & Oncology

## 2016-05-28 ENCOUNTER — Other Ambulatory Visit (HOSPITAL_BASED_OUTPATIENT_CLINIC_OR_DEPARTMENT_OTHER): Payer: Medicare HMO

## 2016-05-28 VITALS — BP 135/64 | HR 62 | Temp 98.2°F | Resp 17 | Wt 155.0 lb

## 2016-05-28 DIAGNOSIS — Z85048 Personal history of other malignant neoplasm of rectum, rectosigmoid junction, and anus: Secondary | ICD-10-CM

## 2016-05-28 DIAGNOSIS — E1159 Type 2 diabetes mellitus with other circulatory complications: Secondary | ICD-10-CM

## 2016-05-28 DIAGNOSIS — C20 Malignant neoplasm of rectum: Secondary | ICD-10-CM

## 2016-05-28 LAB — COMPREHENSIVE METABOLIC PANEL
ALK PHOS: 44 U/L (ref 40–150)
ALT: 23 U/L (ref 0–55)
AST: 16 U/L (ref 5–34)
Albumin: 3.7 g/dL (ref 3.5–5.0)
Anion Gap: 11 mEq/L (ref 3–11)
BILIRUBIN TOTAL: 0.35 mg/dL (ref 0.20–1.20)
BUN: 17.1 mg/dL (ref 7.0–26.0)
CO2: 25 meq/L (ref 22–29)
CREATININE: 0.8 mg/dL (ref 0.6–1.1)
Calcium: 9.5 mg/dL (ref 8.4–10.4)
Chloride: 102 mEq/L (ref 98–109)
EGFR: 80 mL/min/{1.73_m2} — ABNORMAL LOW (ref >90)
GLUCOSE: 257 mg/dL — AB (ref 70–140)
Potassium: 4.7 mEq/L (ref 3.5–5.1)
SODIUM: 138 meq/L (ref 136–145)
TOTAL PROTEIN: 6.7 g/dL (ref 6.4–8.3)

## 2016-05-28 LAB — CBC WITH DIFFERENTIAL (CANCER CENTER ONLY)
BASO#: 0 10*3/uL (ref 0.0–0.2)
BASO%: 0.2 % (ref 0.0–2.0)
EOS%: 1.7 % (ref 0.0–7.0)
Eosinophils Absolute: 0.1 10*3/uL (ref 0.0–0.5)
HCT: 37.5 % (ref 34.8–46.6)
HGB: 13 g/dL (ref 11.6–15.9)
LYMPH#: 1 10*3/uL (ref 0.9–3.3)
LYMPH%: 17.8 % (ref 14.0–48.0)
MCH: 31.5 pg (ref 26.0–34.0)
MCHC: 34.7 g/dL (ref 32.0–36.0)
MCV: 91 fL (ref 81–101)
MONO#: 0.4 10*3/uL (ref 0.1–0.9)
MONO%: 7.3 % (ref 0.0–13.0)
NEUT%: 73 % (ref 39.6–80.0)
NEUTROS ABS: 3.9 10*3/uL (ref 1.5–6.5)
PLATELETS: 123 10*3/uL — AB (ref 145–400)
RBC: 4.13 10*6/uL (ref 3.70–5.32)
RDW: 13.3 % (ref 11.1–15.7)
WBC: 5.3 10*3/uL (ref 3.9–10.0)

## 2016-05-28 LAB — LACTATE DEHYDROGENASE: LDH: 175 U/L (ref 125–245)

## 2016-05-28 LAB — CEA (IN HOUSE-CHCC): CEA (CHCC-In House): <1 ng/mL (ref 0.00–5.00)

## 2016-05-28 NOTE — Progress Notes (Signed)
Hematology and Oncology Follow Up Visit  [name] [id number] [birth year] 66 y.o. 05/28/2016   Principle Diagnosis:  Stage IIA (T3N0M0) rectal cancer - s/p neoadjuvant chemo/radiation therapy and surgery in 07/2014 Current Therapy:    Observation     Interim History:  [name] is back for follow-up. She is doing which better. We'll saw her back in March, she is not doing all that well. She is having a lot of issues. Parachutes slightly anemic back in March.  We checked lab work on her. Her vitamin D was incredibly low at 11.8. She now is taking 50,000 units weekly. I thought her ferritin was a little bit low at 31. She is on some oral iron. Her blood sugars were actually not too bad. Her TSH was 0.5. She had a normal CEA at 0.9.  Outside of the vitamin D, we really have not done much else. Again she feels much better. She does not have as much diarrhea.  She is on Creon. This is helped.  We did check a urinalysis on her. This looked okay. She has some uric acid crystals. There is no obvious infection.   She had a wonderful Easter with her family.  Again, I'm not sure as to why she is feeling better by lab that she does feel better.  She's had no fever. She's had no joint swelling. She's had no leg swelling. She's had no headache. She's had no cough.   Overall, her performance status is ECOG 1.  Medications:  Current Outpatient Prescriptions:  .  aspirin 81 MG tablet, Take 81 mg by mouth at bedtime. , Disp: , Rfl:  .  CREON 24000-76000 units CPEP, TAKE 1 CAPSULE BY MOUTH THREE TIMES DAILY WITH MEALS, Disp: 90 capsule, Rfl: 0 .  diphenoxylate-atropine (LOMOTIL) 2.5-0.025 MG tablet, TAKE 1 TABLET BY MOUTH FOUR TIMES DAILY AS NEEDED FOR DIARRHEA OR LOOSE STOOL, Disp: 100 tablet, Rfl: 0 .  ergocalciferol (VITAMIN D2) 50000 units capsule, Take 1 capsule (50,000 Units total) by mouth once a week., Disp: 12 capsule, Rfl: 3 .  LANTUS 100 UNIT/ML injection, INJECT 96 UNITS  SUBCUTAENOUSLY EVERY NIGHT AT BEDTIME (Patient taking differently: INJECT 88 UNITS SUBCUTAENOUSLY EVERY NIGHT AT BEDTIME), Disp: 10 mL, Rfl: 2 .  metFORMIN (GLUCOPHAGE) 1000 MG tablet, TAKE 1 TABLET BY MOUTH TWICE DAILY WITH A MEAL, Disp: 60 tablet, Rfl: 11 .  metoprolol tartrate (LOPRESSOR) 25 MG tablet, TAKE 1 TABLET BY MOUTH TWICE DAILY, Disp: 60 tablet, Rfl: 5 .  NOVOLOG 100 UNIT/ML injection, INJECT 10 UNITS INTO THE SKIN THREE TIMES DAILY WITH MEALS (Patient taking differently: INJECT 10 UNITS INTO THE SKIN PRN WITH MEALS), Disp: 10 mL, Rfl: 5 .  rosuvastatin (CRESTOR) 20 MG tablet, TAKE 1 TABLET(20 MG) BY MOUTH DAILY, Disp: 90 tablet, Rfl: 3 .  sertraline (ZOLOFT) 50 MG tablet, TAKE 1 TABLET(50MG  TOTAL) BY MOUTH AT BEDTIME, Disp: 90 tablet, Rfl: 2 .  zolpidem (AMBIEN) 10 MG tablet, TAKE 1 TABLET BY MOUTH AT BEDTIME AS NEEDED FOR SLEEP, Disp: 15 tablet, Rfl: 0  Allergies: No Known Allergies  Past Medical History, Surgical history, Social history, and Family History were reviewed and updated.  Review of Systems:  As above  Physical Exam:  weight is 155 lb (70.3 kg). Her oral temperature is 98.2 F (36.8 C). Her blood pressure is 135/64 and her pulse is 62. Her respiration is 17 and oxygen saturation is 97%.   Wt Readings from Last 3 Encounters:  05/28/16 155 lb (70.3  kg)  05/07/16 155 lb 3.2 oz (70.4 kg)  04/12/16 155 lb (70.3 kg)      Well-developed and well-nourished white female in no obvious distress. Head and neck exam shows no ocular or oral lesions. She has no palpable cervical or supraclavicular lymph nodes. Lungs are clear. Cardiac exam regular rate and rhythm with no murmurs, rubs or bruits. Abdomen is soft. She has good bowel sounds. She has well-healed laparotomy scar. There is no fluid wave. There is no palpable liver or spleen tip. Back exam shows no tenderness over the spine, ribs or hips. Extremities shows no clubbing, cyanosis or edema. Neurological exam shows no focal  neurological deficits.  Lab Results  Component Value Date   WBC 5.3 05/28/2016   HGB 13.0 05/28/2016   HCT 37.5 05/28/2016   MCV 91 05/28/2016   PLT 123 (L) 05/28/2016     Chemistry      Component Value Date/Time   NA 140 04/12/2016 1515   NA 142 12/08/2015 1145   K 4.4 04/12/2016 1515   K 4.6 12/08/2015 1145   CL 103 04/12/2016 1515   CO2 26 04/12/2016 1515   CO2 27 12/08/2015 1145   BUN 16 04/12/2016 1515   BUN 9.9 12/08/2015 1145   CREATININE 0.75 04/12/2016 1515   CREATININE 0.6 12/08/2015 1145      Component Value Date/Time   CALCIUM 9.8 04/12/2016 1515   CALCIUM 9.5 12/08/2015 1145   ALKPHOS 46 04/12/2016 1515   ALKPHOS 47 12/08/2015 1145   AST 19 04/12/2016 1515   AST 17 12/08/2015 1145   ALT 24 04/12/2016 1515   ALT 20 12/08/2015 1145   BILITOT 0.2 04/12/2016 1515   BILITOT 0.43 12/08/2015 1145         Impression and Plan: [name] is a 66 year old white female. She has a history of stage IIA rectal cancer. I don't think this is an issue for [place] right now. We can follow her CEA level. I don't think she needs any scans.  She feels better and looks better. Her blood counts look much better. I ensure as exactly what changed. I think we did put her on some vitamin D. She is doing well with the Creon. Her diarrhea doesn't seem to be as bad.  On the we can probably get her back now on her regular appointment. We get her back in 6 months.  She knows that she can always come back to see [place] sooner if there is a problem.   Danielle Napoleon, MD 4/24/201812:43 PM

## 2016-05-29 ENCOUNTER — Telehealth: Payer: Self-pay | Admitting: *Deleted

## 2016-05-29 NOTE — Telephone Encounter (Addendum)
Patient is aware of results  ----- Message from Volanda Napoleon, MD sent at 05/29/2016  7:09 AM EDT ----- Call - blood sugar is still way too high!!! pete

## 2016-06-05 ENCOUNTER — Telehealth: Payer: Self-pay | Admitting: Family Medicine

## 2016-06-05 NOTE — Telephone Encounter (Signed)
Pt state that she is still having the spells and the next day she is very exalted and do not want to do anything. Day1 sleep all day Day2 very weak and no energy. Cardiologist need last lab work that was done.  Pt would like to know if a TSH and B12 was done?

## 2016-06-05 NOTE — Telephone Encounter (Signed)
Spoke with patient and reviewed lab results.  She will monitor her glucose and an appointment scheduled for Monday if no improvement with symtoms.  Copy of labs will be faxed to Dr Wynonia Lawman

## 2016-06-10 ENCOUNTER — Ambulatory Visit: Payer: Medicare HMO | Admitting: Family Medicine

## 2016-06-19 ENCOUNTER — Other Ambulatory Visit: Payer: Self-pay | Admitting: Hematology & Oncology

## 2016-07-01 IMAGING — CR DG RIBS W/ CHEST 3+V*R*
3 series · 3 of 3 positions shown · non-contrast
Comparison: Chest x-ray on 03/31/2012

CLINICAL DATA: Fall last week with right rib pain. Shortness of
breath with exertion. Initial encounter.

EXAM:
RIGHT RIBS AND CHEST - 3+ VIEW

[chest pa]
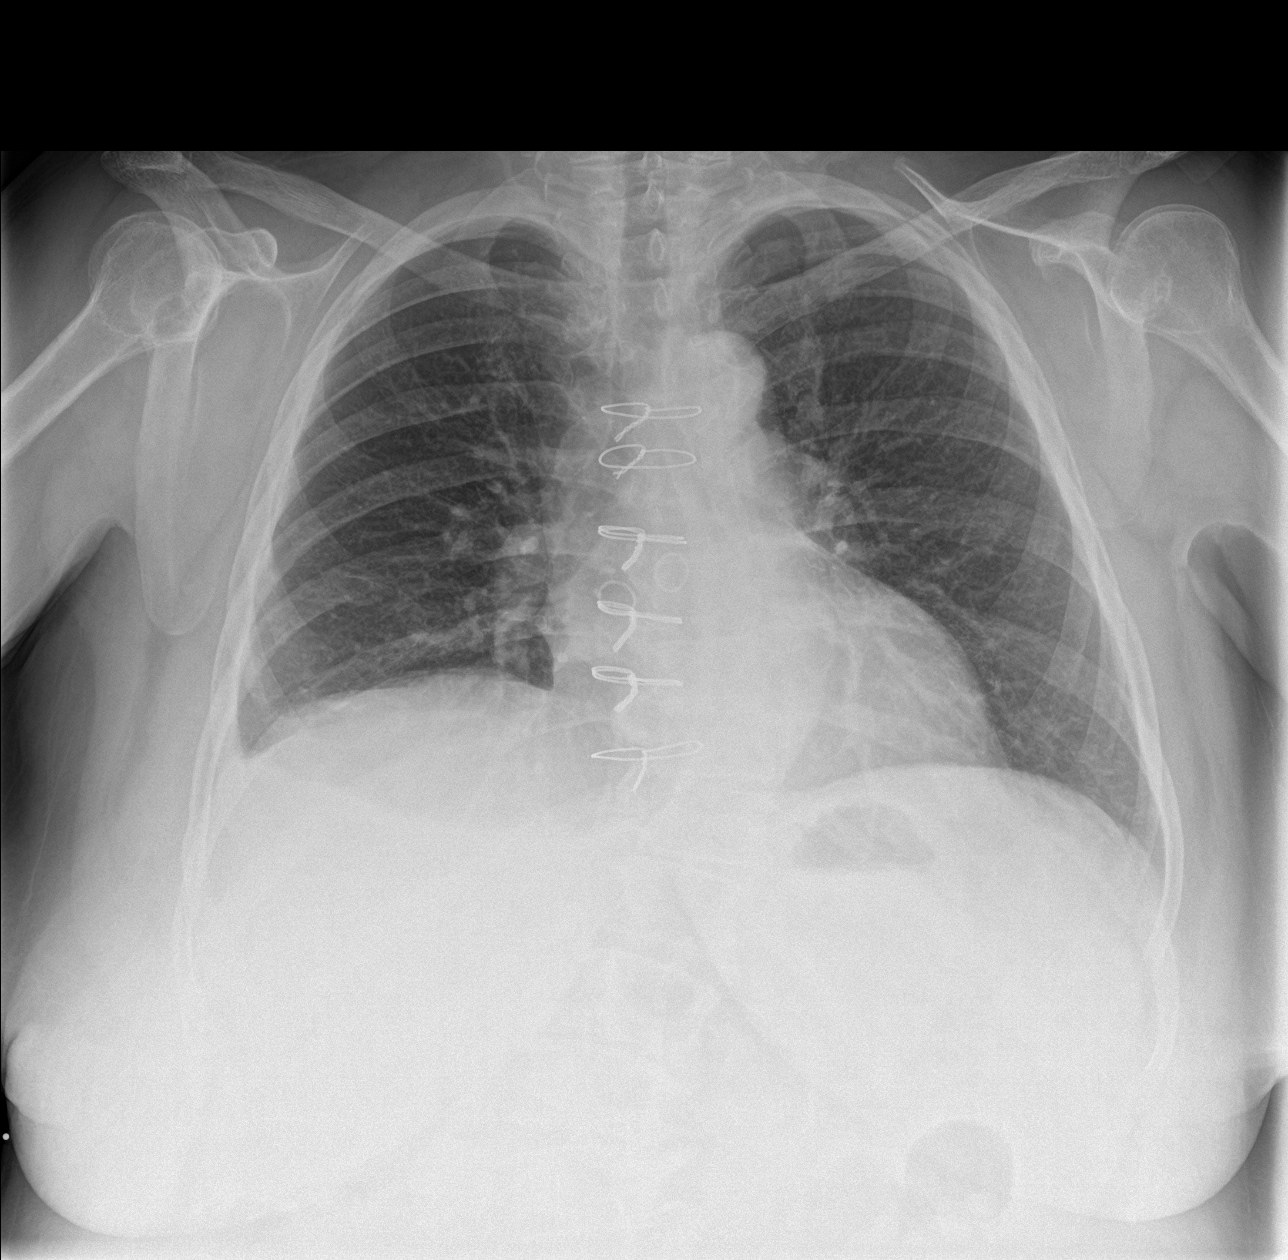

[rib pa]
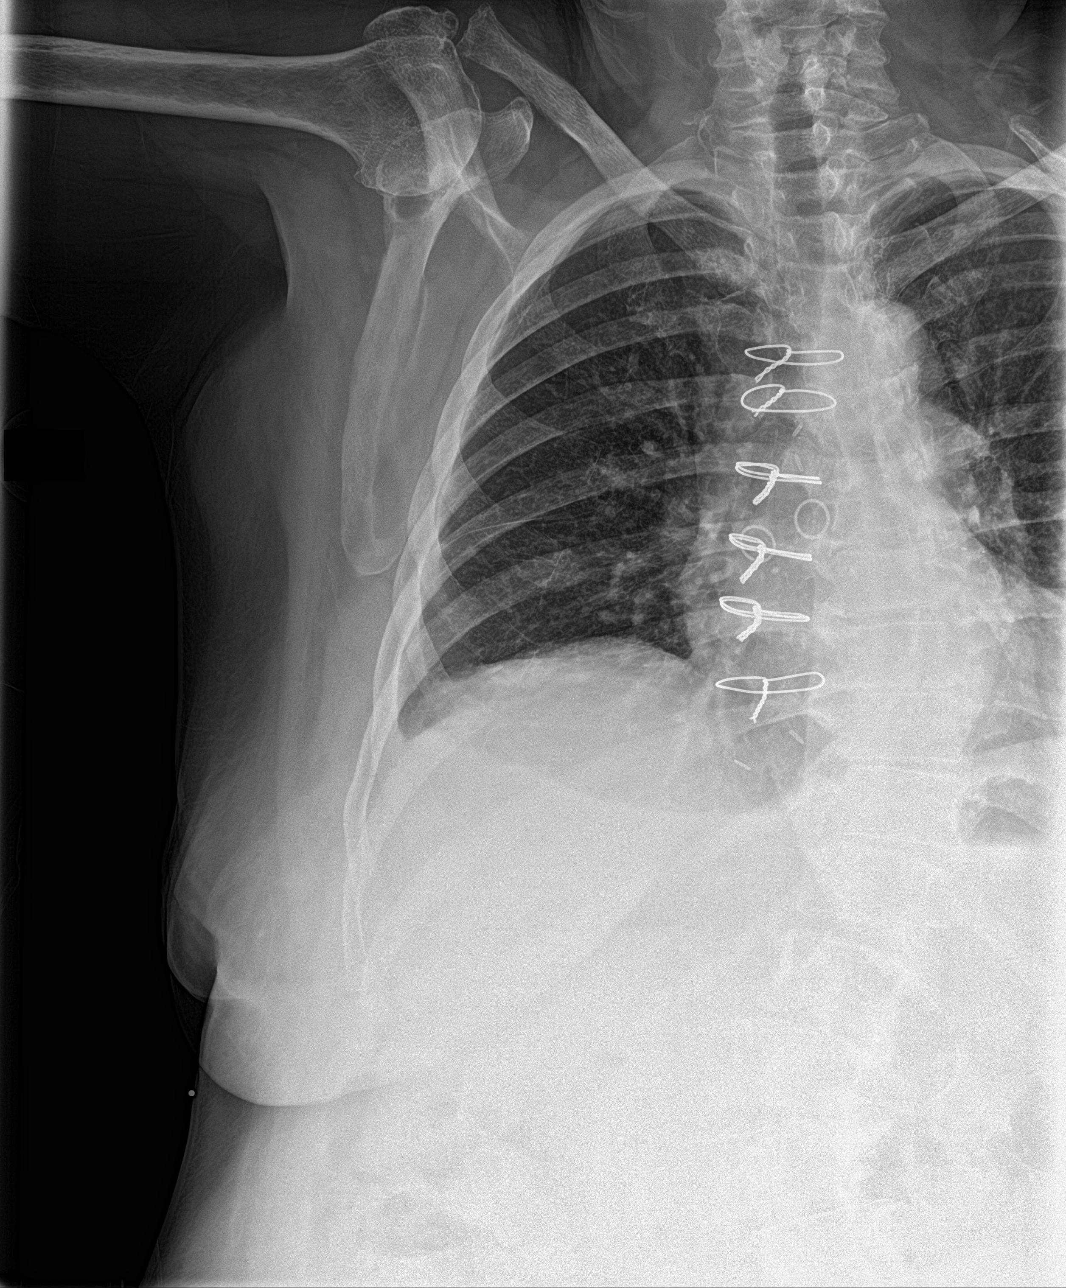

[rib pa obl]
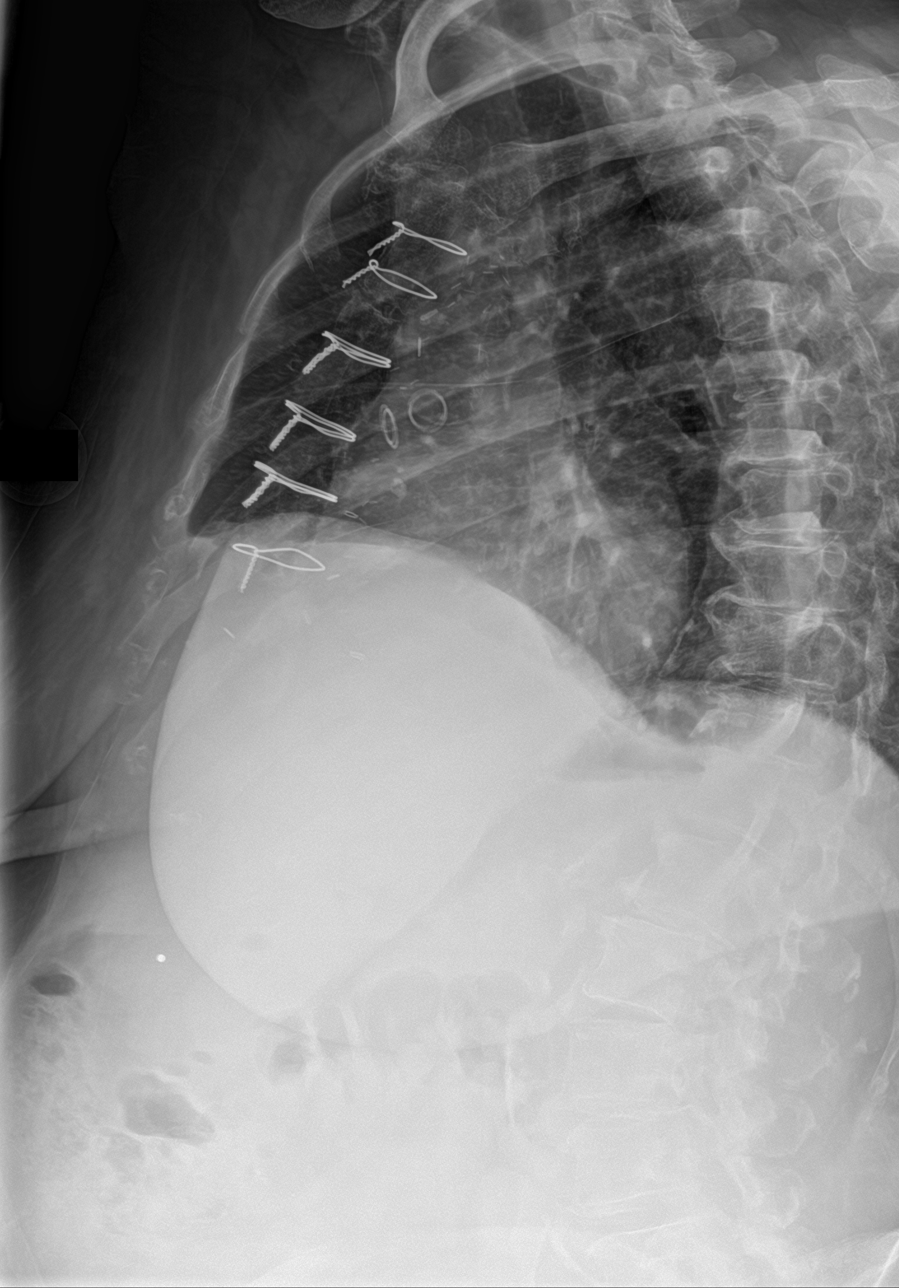

[3 of 3 positions shown; findings below may reference images not displayed]

FINDINGS: No acute rib fracture is identified. No evidence of pneumothorax or
pleural effusion. Lung volumes are relatively low. No focal airspace
consolidation. The heart size is stable and normal status post prior
CABG. The aorta is mildly ectatic.
IMPRESSION: No evidence of right rib fracture or other acute findings.

## 2016-07-05 ENCOUNTER — Other Ambulatory Visit: Payer: Self-pay | Admitting: Hematology & Oncology

## 2016-07-05 DIAGNOSIS — K8689 Other specified diseases of pancreas: Secondary | ICD-10-CM

## 2016-07-05 DIAGNOSIS — C20 Malignant neoplasm of rectum: Secondary | ICD-10-CM

## 2016-07-16 ENCOUNTER — Other Ambulatory Visit: Payer: Self-pay | Admitting: Family Medicine

## 2016-08-13 ENCOUNTER — Ambulatory Visit (INDEPENDENT_AMBULATORY_CARE_PROVIDER_SITE_OTHER): Payer: Medicare HMO | Admitting: Family Medicine

## 2016-08-13 ENCOUNTER — Encounter: Payer: Self-pay | Admitting: Family Medicine

## 2016-08-13 VITALS — BP 120/70 | HR 70 | Temp 98.1°F | Wt 157.1 lb

## 2016-08-13 DIAGNOSIS — I25810 Atherosclerosis of coronary artery bypass graft(s) without angina pectoris: Secondary | ICD-10-CM | POA: Diagnosis not present

## 2016-08-13 DIAGNOSIS — R300 Dysuria: Secondary | ICD-10-CM | POA: Diagnosis not present

## 2016-08-13 DIAGNOSIS — E1159 Type 2 diabetes mellitus with other circulatory complications: Secondary | ICD-10-CM

## 2016-08-13 LAB — POCT URINALYSIS DIPSTICK
Bilirubin, UA: NEGATIVE
Blood, UA: NEGATIVE
Glucose, UA: POSITIVE
KETONES UA: NEGATIVE
LEUKOCYTES UA: NEGATIVE
NITRITE UA: NEGATIVE
PH UA: 6.5 (ref 5.0–8.0)
PROTEIN UA: NEGATIVE
Spec Grav, UA: 1.015 (ref 1.010–1.025)
Urobilinogen, UA: 0.2 E.U./dL

## 2016-08-13 LAB — MICROALBUMIN / CREATININE URINE RATIO
CREATININE, U: 39.9 mg/dL
MICROALB/CREAT RATIO: 8.8 mg/g (ref 0.0–30.0)
Microalb, Ur: 3.5 mg/dL — ABNORMAL HIGH (ref 0.0–1.9)

## 2016-08-13 LAB — POCT GLYCOSYLATED HEMOGLOBIN (HGB A1C): HEMOGLOBIN A1C: 6.5

## 2016-08-13 NOTE — Progress Notes (Signed)
Subjective:     Patient ID: [name], female   DOB: [birth year], 66 y.o.   MRN: [id number]  HPI Patient is here for routine medical follow-up.  She has history of type 2 diabetes, CAD with bypass several years ago, history of rectal cancer, hyperlipidemia, history depression, obesity.    Diabetes has been well controlled. A1c's have been consistently upper 5 to low 6 range. She is currently not taking NovoLog but does remain on metformin and Lantus. No recent hypoglycemia. She's not had recent urine microalbumin screen. She did have eye exam by report a few months ago which was normal. No neuropathy symptoms.  Does relate some burning with urination and frequency of the past couple of weeks. No fevers or chills. No flank pain.  Past Medical History:  Diagnosis Date  .  Paroxysmal SVT (ANVRT)    S/p AV nodal ablation Dr. Lovena Le 09/28/08   . CAD    Coronary disease status post non-ST elevation MI in May 2010 with  PCI of the left circumflex on Jun 24, 2008. She then had a PCI of  the RCA on July 15, 2008. 02/24/12 Cath, Severe 95% LAD, ostial 95% circ, ostial RCA, patent stents in distal RCA and mid circ normal LV 02/26/12 CABG with LIMA to LAD, SVG to RCA, and SVG to OM Dr. Roxan Hockey    . Cancer [facility])    adenocarcinoma of rectum  . Cataract   . Depression   . Diabetes mellitus, insulin dependent (IDDM), uncontrolled ([place])   . Heart murmur   . Hyperlipidemia   . Hypertensive heart disease   . S/P radiation therapy 04/11/14-05/18/14   50.4Gy rectal   Past Surgical History:  Procedure Laterality Date  . COLONOSCOPY    . COLONOSCOPY WITH PROPOFOL N/A 07/12/2014   Procedure: COLONOSCOPY WITH PROPOFOL POSSIBLE POLYP RESECTION;  Surgeon: [name], MD;  Location: [facility];  Service: Endoscopy;  Laterality: N/A;   to be admitted after colonoscopy-robotic surgery on 6/8  . CORONARY ANGIOPLASTY WITH STENT PLACEMENT    . CORONARY ARTERY BYPASS GRAFT  02/26/2012   Roxan Hockey, MD;  Location:  [place];  Service: Open Heart Surgery;  Laterality: N/A;  CABG x three,  using left internal mammary artery  and left leg greater saphenous vein,   . ENDOVEIN HARVEST OF GREATER SAPHENOUS VEIN  02/26/2012  . EUS N/A 03/24/2014   Procedure: LOWER ENDOSCOPIC ULTRASOUND (EUS);  Surgeon: Milus Banister, MD;  Location: [facility];  Service: Endoscopy;  Laterality: N/A;  . LEFT HEART CATHETERIZATION WITH CORONARY ANGIOGRAM N/A 02/24/2012   Procedure: LEFT HEART CATHETERIZATION WITH CORONARY ANGIOGRAM;  Surgeon: Jacolyn Reedy, MD;  Location: [facility] CATH LAB;  Service: Cardiovascular;  Laterality: N/A;    reports that she has never smoked. She has never used smokeless tobacco. She reports that she does not drink alcohol or use drugs. family history includes Colon polyps in her maternal grandmother; Diabetes in her brother, father, and sister. No Known Allergies   Review of Systems  Constitutional: Negative for fatigue.  Eyes: Negative for visual disturbance.  Respiratory: Negative for cough, chest tightness, shortness of breath and wheezing.   Cardiovascular: Negative for chest pain, palpitations and leg swelling.  Gastrointestinal: Negative for abdominal pain.  Genitourinary: Positive for dysuria. Negative for difficulty urinating, flank pain, hematuria and vaginal discharge.  Neurological: Negative for dizziness, seizures, syncope, weakness, light-headedness and headaches.       Objective:   Physical Exam  Constitutional: She appears well-developed and well-nourished.  Eyes: Pupils are equal, round, and reactive to light.  Neck: Neck supple. No JVD present. No thyromegaly present.  Cardiovascular: Normal rate and regular rhythm.  Exam reveals no gallop.   Pulmonary/Chest: Effort normal and breath sounds normal. No respiratory distress. She has no wheezes. She has no rales.  Musculoskeletal: She exhibits no edema.  Neurological: She is alert.  Skin:  Feet reveal no skin lesions. Good distal  foot pulses. Good capillary refill. No calluses. Normal sensation with monofilament testing        Assessment:     #1 type 2 diabetes. A1c today 6.5% which is only slightly increased from previous value  #2 dyslipidemia  #3 dysuria.. Urine dipstick reveals only glucose but no signs of infection  #4 history of CAD with prior bypass several years ago    Plan:     -Check urine microalbumin screen as patient is not on ACE inhibitor or angiotensin receptor blocker. -Continue with yearly eye exams -No medication changes made at this time -Routine follow-up in 6 months and sooner as needed  Eulas Post MD [facility] at [facility]

## 2016-08-18 ENCOUNTER — Other Ambulatory Visit: Payer: Self-pay | Admitting: Hematology & Oncology

## 2016-08-18 DIAGNOSIS — C20 Malignant neoplasm of rectum: Secondary | ICD-10-CM

## 2016-08-18 DIAGNOSIS — K8689 Other specified diseases of pancreas: Secondary | ICD-10-CM

## 2016-08-19 ENCOUNTER — Other Ambulatory Visit: Payer: Self-pay | Admitting: Family Medicine

## 2016-09-06 ENCOUNTER — Other Ambulatory Visit: Payer: Self-pay | Admitting: *Deleted

## 2016-09-06 MED ORDER — GLUCOSE BLOOD VI STRP: ORAL_STRIP | 12 refills | Status: DC

## 2016-09-06 MED ORDER — ACCU-CHEK NANO SMARTVIEW W/DEVICE KIT: PACK | 99 refills | Status: DC

## 2016-09-12 ENCOUNTER — Other Ambulatory Visit: Payer: Self-pay | Admitting: *Deleted

## 2016-09-12 ENCOUNTER — Telehealth: Payer: Self-pay | Admitting: Family Medicine

## 2016-09-12 MED ORDER — ALCOHOL SWABS PADS: 1.0000 | MEDICATED_PAD | 1 refills | Status: DC | PRN

## 2016-09-12 MED ORDER — "INSULIN SYRINGE-NEEDLE U-100 31G X 5/16"" 0.3 ML MISC": 1.0000 | Freq: Every day | 1 refills | Status: DC

## 2016-09-12 MED ORDER — ACCU-CHEK FASTCLIX LANCETS MISC: 1.0000 | Freq: Every day | 1 refills | Status: DC

## 2016-09-12 NOTE — Telephone Encounter (Signed)
Rx done-see prior note. 

## 2016-09-12 NOTE — Telephone Encounter (Signed)
Pt need new Rx for syringes and alcohol swabs    Pharm:  [place]

## 2016-09-12 NOTE — Telephone Encounter (Signed)
Rx done. 

## 2016-09-17 ENCOUNTER — Encounter: Payer: Self-pay | Admitting: Family Medicine

## 2016-09-17 ENCOUNTER — Ambulatory Visit (INDEPENDENT_AMBULATORY_CARE_PROVIDER_SITE_OTHER): Payer: Medicare HMO | Admitting: Family Medicine

## 2016-09-17 VITALS — BP 140/70 | HR 75 | Temp 98.9°F | Wt 157.8 lb

## 2016-09-17 DIAGNOSIS — R3915 Urgency of urination: Secondary | ICD-10-CM

## 2016-09-17 MED ORDER — SOLIFENACIN SUCCINATE 5 MG PO TABS: 5.0000 mg | ORAL_TABLET | Freq: Every day | ORAL | 1 refills | Status: DC

## 2016-09-17 NOTE — Patient Instructions (Addendum)
Avoid drinking too much at night Continue to avoid caffeine The Vesicare may cause some dry mouth and mild constipation. If this does not work or is not tolerated may need to look at alternative such as Myrbetriq.

## 2016-09-17 NOTE — Progress Notes (Signed)
Subjective:     Patient ID: [name], female   DOB: [birth year], 66 y.o.   MRN: [id number]  HPI Patient is here to discuss urinary urgency. She states she's had this for years but progressive especially over the past several months. She avoids caffeine altogether. No alcohol use. Tries to avoid excessive fluids at night. Frequently has to get up 6-7 times at night. Also has daytime symptoms. No burning with urination. Good flow. No obstructive symptoms. Never taken any medications for urine urgency. Starting to disrupt sleep more  Past Medical History:  Diagnosis Date  .  Paroxysmal SVT (ANVRT)    S/p AV nodal ablation Dr. Lovena Le 09/28/08   . CAD    Coronary disease status post non-ST elevation MI in May 2010 with  PCI of the left circumflex on Jun 24, 2008. She then had a PCI of  the RCA on July 15, 2008. 02/24/12 Cath, Severe 95% LAD, ostial 95% circ, ostial RCA, patent stents in distal RCA and mid circ normal LV 02/26/12 CABG with LIMA to LAD, SVG to RCA, and SVG to OM Dr. Roxan Hockey    . Cancer [facility])    adenocarcinoma of rectum  . Cataract   . Depression   . Diabetes mellitus, insulin dependent (IDDM), uncontrolled ([place])   . Heart murmur   . Hyperlipidemia   . Hypertensive heart disease   . S/P radiation therapy 04/11/14-05/18/14   50.4Gy rectal   Past Surgical History:  Procedure Laterality Date  . COLONOSCOPY    . COLONOSCOPY WITH PROPOFOL N/A 07/12/2014   Procedure: COLONOSCOPY WITH PROPOFOL POSSIBLE POLYP RESECTION;  Surgeon: [name], MD;  Location: [facility];  Service: Endoscopy;  Laterality: N/A;   to be admitted after colonoscopy-robotic surgery on 6/8  . CORONARY ANGIOPLASTY WITH STENT PLACEMENT    . CORONARY ARTERY BYPASS GRAFT  02/26/2012   Roxan Hockey, MD;  Location: [place];  Service: Open Heart Surgery;  Laterality: N/A;  CABG x three,  using left internal mammary artery  and left leg greater saphenous vein,   . ENDOVEIN HARVEST OF GREATER SAPHENOUS VEIN  02/26/2012   . EUS N/A 03/24/2014   Procedure: LOWER ENDOSCOPIC ULTRASOUND (EUS);  Surgeon: Milus Banister, MD;  Location: [facility];  Service: Endoscopy;  Laterality: N/A;  . LEFT HEART CATHETERIZATION WITH CORONARY ANGIOGRAM N/A 02/24/2012   Procedure: LEFT HEART CATHETERIZATION WITH CORONARY ANGIOGRAM;  Surgeon: Jacolyn Reedy, MD;  Location: [facility] CATH LAB;  Service: Cardiovascular;  Laterality: N/A;    reports that she has never smoked. She has never used smokeless tobacco. She reports that she does not drink alcohol or use drugs. family history includes Colon polyps in her maternal grandmother; Diabetes in her brother, father, and sister. No Known Allergies   Review of Systems  Genitourinary: Positive for frequency and urgency. Negative for decreased urine volume, difficulty urinating and hematuria.       Objective:   Physical Exam  Constitutional: She appears well-developed and well-nourished.  Cardiovascular: Normal rate and regular rhythm.   Pulmonary/Chest: Effort normal and breath sounds normal. No respiratory distress. She has no wheezes. She has no rales.       Assessment:     Urine urgency-fairly severe and starting to disrupt sleep    Plan:     -Continue to avoid excessive fluids at night -Continue to avoid caffeine -Trial of Vesicare 5 mg daily at bedtime. She has no contraindications. Reviewed potential side effects including dry mouth and constipation. Touch base if  not improved over the next month  Eulas Post MD [facility] at [facility]

## 2016-09-19 ENCOUNTER — Other Ambulatory Visit: Payer: Self-pay | Admitting: Family Medicine

## 2016-09-21 ENCOUNTER — Other Ambulatory Visit: Payer: Self-pay | Admitting: Family Medicine

## 2016-10-02 ENCOUNTER — Other Ambulatory Visit: Payer: Self-pay | Admitting: Family Medicine

## 2016-10-02 ENCOUNTER — Other Ambulatory Visit: Payer: Self-pay | Admitting: Hematology & Oncology

## 2016-10-02 DIAGNOSIS — C20 Malignant neoplasm of rectum: Secondary | ICD-10-CM

## 2016-10-02 DIAGNOSIS — K8689 Other specified diseases of pancreas: Secondary | ICD-10-CM

## 2016-10-11 ENCOUNTER — Telehealth: Payer: Self-pay | Admitting: Family Medicine

## 2016-10-11 MED ORDER — SOLIFENACIN SUCCINATE 10 MG PO TABS: 10.0000 mg | ORAL_TABLET | Freq: Every day | ORAL | 1 refills | Status: DC

## 2016-10-11 NOTE — Telephone Encounter (Signed)
Rx sent and patient is aware. 

## 2016-10-11 NOTE — Telephone Encounter (Signed)
Increase to 10 mg once daily.

## 2016-10-11 NOTE — Telephone Encounter (Signed)
Pt request refill  solifenacin (VESICARE) 5 MG tablet  Pt is still going 4 x a night to the bathroom, and pt states she hopes to get it down to 2 x a night.  So pt doesn't want to refill the current Rx.  She hopes to get an increase on this.  [facility], [address] [address]

## 2016-10-17 ENCOUNTER — Telehealth: Payer: Self-pay | Admitting: Family Medicine

## 2016-10-17 DIAGNOSIS — N3090 Cystitis, unspecified without hematuria: Secondary | ICD-10-CM

## 2016-10-17 NOTE — Telephone Encounter (Signed)
Pt would like to have a referral to Neurologist for cystitis without infection.  Pt is aware that Dr. Elease Hashimoto is out of the office today and will address tomorrow 10/18/16.

## 2016-10-18 NOTE — Telephone Encounter (Signed)
Does she mean Urologist?  If so, OK.  Neurology referral would not be appropriate.

## 2016-10-18 NOTE — Telephone Encounter (Signed)
I left a message for the pt to return my call. 

## 2016-10-21 NOTE — Telephone Encounter (Signed)
I left a message for the pt to return my call. 

## 2016-10-21 NOTE — Telephone Encounter (Signed)
Patient called back and stated she would like to see a urologist.  She is aware the referral was placed and someone will call with appt info.

## 2016-10-22 ENCOUNTER — Other Ambulatory Visit: Payer: Self-pay | Admitting: Family Medicine

## 2016-10-24 ENCOUNTER — Encounter: Payer: Self-pay | Admitting: Family Medicine

## 2016-10-29 ENCOUNTER — Ambulatory Visit: Payer: Medicare HMO

## 2016-11-18 ENCOUNTER — Other Ambulatory Visit: Payer: Self-pay | Admitting: Hematology & Oncology

## 2016-11-18 DIAGNOSIS — K8689 Other specified diseases of pancreas: Secondary | ICD-10-CM

## 2016-11-18 DIAGNOSIS — C20 Malignant neoplasm of rectum: Secondary | ICD-10-CM

## 2016-11-28 ENCOUNTER — Ambulatory Visit: Payer: Medicare HMO | Admitting: Family

## 2016-11-28 ENCOUNTER — Other Ambulatory Visit: Payer: Medicare HMO

## 2016-11-28 DIAGNOSIS — R3915 Urgency of urination: Secondary | ICD-10-CM | POA: Diagnosis not present

## 2016-11-28 DIAGNOSIS — R350 Frequency of micturition: Secondary | ICD-10-CM | POA: Diagnosis not present

## 2016-11-28 DIAGNOSIS — N3000 Acute cystitis without hematuria: Secondary | ICD-10-CM | POA: Diagnosis not present

## 2016-11-28 DIAGNOSIS — R351 Nocturia: Secondary | ICD-10-CM | POA: Diagnosis not present

## 2016-11-28 DIAGNOSIS — N3001 Acute cystitis with hematuria: Secondary | ICD-10-CM | POA: Diagnosis not present

## 2016-12-03 ENCOUNTER — Ambulatory Visit (HOSPITAL_BASED_OUTPATIENT_CLINIC_OR_DEPARTMENT_OTHER): Payer: Medicare HMO | Admitting: Hematology & Oncology

## 2016-12-03 ENCOUNTER — Other Ambulatory Visit (HOSPITAL_BASED_OUTPATIENT_CLINIC_OR_DEPARTMENT_OTHER): Payer: Medicare HMO

## 2016-12-03 VITALS — BP 139/91 | HR 70 | Temp 98.3°F | Resp 16 | Wt 153.0 lb

## 2016-12-03 DIAGNOSIS — E1159 Type 2 diabetes mellitus with other circulatory complications: Secondary | ICD-10-CM

## 2016-12-03 DIAGNOSIS — Z923 Personal history of irradiation: Secondary | ICD-10-CM | POA: Diagnosis not present

## 2016-12-03 DIAGNOSIS — Z85048 Personal history of other malignant neoplasm of rectum, rectosigmoid junction, and anus: Secondary | ICD-10-CM

## 2016-12-03 DIAGNOSIS — C20 Malignant neoplasm of rectum: Secondary | ICD-10-CM

## 2016-12-03 DIAGNOSIS — E119 Type 2 diabetes mellitus without complications: Secondary | ICD-10-CM | POA: Diagnosis not present

## 2016-12-03 DIAGNOSIS — Z9221 Personal history of antineoplastic chemotherapy: Secondary | ICD-10-CM

## 2016-12-03 LAB — CBC WITH DIFFERENTIAL (CANCER CENTER ONLY)
BASO#: 0 10*3/uL (ref 0.0–0.2)
BASO%: 0.2 % (ref 0.0–2.0)
EOS%: 2.1 % (ref 0.0–7.0)
Eosinophils Absolute: 0.1 10*3/uL (ref 0.0–0.5)
HEMATOCRIT: 37.8 % (ref 34.8–46.6)
HEMOGLOBIN: 13.2 g/dL (ref 11.6–15.9)
LYMPH#: 1 10*3/uL (ref 0.9–3.3)
LYMPH%: 17.6 % (ref 14.0–48.0)
MCH: 32.5 pg (ref 26.0–34.0)
MCHC: 34.9 g/dL (ref 32.0–36.0)
MCV: 93 fL (ref 81–101)
MONO#: 0.5 10*3/uL (ref 0.1–0.9)
MONO%: 7.9 % (ref 0.0–13.0)
NEUT%: 72.2 % (ref 39.6–80.0)
NEUTROS ABS: 4.2 10*3/uL (ref 1.5–6.5)
Platelets: 137 10*3/uL — ABNORMAL LOW (ref 145–400)
RBC: 4.06 10*6/uL (ref 3.70–5.32)
RDW: 13.7 % (ref 11.1–15.7)
WBC: 5.8 10*3/uL (ref 3.9–10.0)

## 2016-12-03 LAB — CMP (CANCER CENTER ONLY)
ALBUMIN: 3.5 g/dL (ref 3.3–5.5)
ALT: 39 U/L (ref 10–47)
AST: 32 U/L (ref 11–38)
Alkaline Phosphatase: 51 U/L (ref 26–84)
BILIRUBIN TOTAL: 0.5 mg/dL (ref 0.20–1.60)
BUN: 12 mg/dL (ref 7–22)
CALCIUM: 9.8 mg/dL (ref 8.0–10.3)
CO2: 26 meq/L (ref 18–33)
Chloride: 105 mEq/L (ref 98–108)
Creat: 0.7 mg/dl (ref 0.6–1.2)
GLUCOSE: 178 mg/dL — AB (ref 73–118)
Potassium: 4.6 mEq/L (ref 3.3–4.7)
SODIUM: 145 meq/L (ref 128–145)
TOTAL PROTEIN: 6.8 g/dL (ref 6.4–8.1)

## 2016-12-03 LAB — LACTATE DEHYDROGENASE: LDH: 199 U/L (ref 125–245)

## 2016-12-03 LAB — CEA (IN HOUSE-CHCC): CEA (CHCC-In House): 1.06 ng/mL (ref 0.00–5.00)

## 2016-12-03 NOTE — Progress Notes (Signed)
Hematology and Oncology Follow Up Visit  [name] [id number] 1950-06-11 66 y.o. 12/03/2016   Principle Diagnosis:  Stage IIA (T3N0M0) rectal cancer - s/p neoadjuvant chemo/radiation therapy and surgery in 07/2014 Current Therapy:    Observation     Interim History:  [name] is back for follow-up. We last saw [name] back in April. Since then, [name]'s been doing okay. [name] had a fairly busy summer. [name] went to the beach. A sister has a Programmer, multimedia and [place].  [name] is trying to watch [name] diabetes. [name] says [name] last hemoglobin A1c was 6.1. [name]'s had no bleeding. [name]'s had no change in bowel or bladder habits. [name] is on Creon to help with diarrhea.  [name] does not want to take a statin drug for hyperlipidemia. [name] refuses to take [name] Crestor.  [name]'s had no rashes. [name]'s had no leg swelling. [name]'s had no headache.  [name] last CEA was 1.  Overall, [name] performance status is ECOG 0.   Medications:  Current Outpatient Prescriptions:  .  aspirin 81 MG tablet, Take 81 mg by mouth at bedtime. , Disp: , Rfl:  .  Blood Glucose Monitoring Suppl (ACCU-CHEK NANO SMARTVIEW) w/Device KIT, Test once daily E11.59, Disp: 1 kit, Rfl: prn .  CREON 24000-76000 units CPEP, TAKE 1 CAPSULE BY MOUTH THREE TIMES DAILY WITH MEALS, Disp: 90 capsule, Rfl: 0 .  diphenoxylate-atropine (LOMOTIL) 2.5-0.025 MG tablet, TAKE 1 TABLET BY MOUTH FOUR TIMES DAILY AS NEEDED FOR DIARRHEA/ LOOSE STOOL, Disp: 100 tablet, Rfl: 0 .  ergocalciferol (VITAMIN D2) 50000 units capsule, Take 1 capsule (50,000 Units total) by mouth once a week., Disp: 12 capsule, Rfl: 3 .  FLUAD 0.5 ML SUSY, ADM 0.5ML IM UTD, Disp: , Rfl: 0 .  glucose blood (ACCU-CHEK SMARTVIEW) test strip, Test once daily E11.59, Disp: 100 each, Rfl: 12 .  Insulin Syringe-Needle U-100 (BD INSULIN SYRINGE ULTRAFINE) 31G X 5/16" 0.3 ML MISC, Inject 1 Syringe into the skin daily., Disp: 100 each, Rfl: 1 .  LANTUS 100 UNIT/ML injection, INJECT 96 UNITS  SUBCUTAENOUSLY EVERY NIGHT AT BEDTIME (Patient taking differently: INJECT 88 UNITS SUBCUTAENOUSLY EVERY NIGHT AT BEDTIME), Disp: 10 mL, Rfl: 2 .  metFORMIN (GLUCOPHAGE) 1000 MG tablet, TAKE 1 TABLET BY MOUTH TWICE DAILY WITH A MEAL, Disp: 60 tablet, Rfl: 5 .  metoprolol tartrate (LOPRESSOR) 25 MG tablet, TAKE 1 TABLET BY MOUTH TWICE DAILY, Disp: 180 tablet, Rfl: 1 .  Multiple Vitamins-Minerals (WOMENS MULTIVITAMIN PO), Take by mouth., Disp: , Rfl:  .  NOVOLOG 100 UNIT/ML injection, INJECT 10 UNITS INTO THE SKIN THREE TIMES DAILY WITH MEALS (Patient taking differently: INJECT 10 UNITS INTO THE SKIN PRN WITH MEALS), Disp: 10 mL, Rfl: 5 .  rosuvastatin (CRESTOR) 20 MG tablet, TAKE 1 TABLET(20 MG) BY MOUTH DAILY, Disp: 90 tablet, Rfl: 0 .  sertraline (ZOLOFT) 50 MG tablet, TAKE 1 TABLET(50MG TOTAL) BY MOUTH AT BEDTIME, Disp: 90 tablet, Rfl: 1 .  solifenacin (VESICARE) 10 MG tablet, Take 1 tablet (10 mg total) by mouth daily., Disp: 90 tablet, Rfl: 1 .  sulfamethoxazole-trimethoprim (BACTRIM DS,SEPTRA DS) 800-160 MG tablet, TK 1 T PO  BID, Disp: , Rfl: 0 .  zolpidem (AMBIEN) 10 MG tablet, TAKE 1 TABLET BY MOUTH AT BEDTIME AS NEEDED FOR SLEEP, Disp: 15 tablet, Rfl: 0  Allergies: No Known Allergies  Past Medical History, Surgical history, Social history, and Family History were reviewed and updated.  Review of Systems: As stated in the interim history  Physical Exam:  weight is 153 lb (69.4 kg). [name] oral temperature is 98.3 F (36.8 C). [name] blood pressure is 139/91 (abnormal) and [name] pulse is 70. [name] respiration is 16 and oxygen saturation is 97%.   Wt Readings from Last 3 Encounters:  12/03/16 153 lb (69.4 kg)  09/17/16 157 lb 12.8 oz (71.6 kg)  08/13/16 157 lb 1.6 oz (71.3 kg)      Mildly obese white female in no obvious distress. Head and neck exam shows no ocular or oral lesions. There are no palpable cervical or supraclavicular lymph nodes. Lungs are clear bilaterally. Cardiac exam regular  rate and rhythm with no murmurs, rubs or bruits. Abdomen is soft. [name] had multiple laparotomy and laparoscopy scars. These are well-healed. [name] has no fluid wave. [name] is moderately obese. There is no abdominal mass. There is no palpable liver or spleen tip. Back exam shows no tenderness over the spine, ribs or hips. Extremities shows no clubbing, cyanosis or edema. Neurological exam shows no focal neurological deficits.   Lab Results  Component Value Date   WBC 5.8 12/03/2016   HGB 13.2 12/03/2016   HCT 37.8 12/03/2016   MCV 93 12/03/2016   PLT 137 (L) 12/03/2016     Chemistry      Component Value Date/Time   NA 145 12/03/2016 1134   NA 138 05/28/2016 1139   K 4.6 12/03/2016 1134   K 4.7 05/28/2016 1139   CL 105 12/03/2016 1134   CO2 26 12/03/2016 1134   CO2 25 05/28/2016 1139   BUN 12 12/03/2016 1134   BUN 17.1 05/28/2016 1139   CREATININE 0.7 12/03/2016 1134   CREATININE 0.8 05/28/2016 1139      Component Value Date/Time   CALCIUM 9.8 12/03/2016 1134   CALCIUM 9.5 05/28/2016 1139   ALKPHOS 51 12/03/2016 1134   ALKPHOS 44 05/28/2016 1139   AST 32 12/03/2016 1134   AST 16 05/28/2016 1139   ALT 39 12/03/2016 1134   ALT 23 05/28/2016 1139   BILITOT 0.50 12/03/2016 1134   BILITOT 0.35 05/28/2016 1139      Impression and Plan: [name] is a 66 year old white female. [name] has a history of stage IIA rectal cancer. I don't think this is an issue for [place] right now. We can follow [name] CEA level. I don't think [name] needs any scans.  I really do not see a problem with respect to [name] rectal cancer. I am very pleased that [name] is done so well.  [name] right now, we will get [name] back in 6 more months.  I think that [name] prognosis with respect to, location will clearly be dependent upon [name] diabetes.   Volanda Napoleon, MD 10/30/20181:17 PM

## 2016-12-20 ENCOUNTER — Other Ambulatory Visit: Payer: Self-pay | Admitting: Family Medicine

## 2017-01-06 ENCOUNTER — Other Ambulatory Visit: Payer: Self-pay | Admitting: Hematology & Oncology

## 2017-01-06 DIAGNOSIS — K8689 Other specified diseases of pancreas: Secondary | ICD-10-CM

## 2017-01-06 DIAGNOSIS — C20 Malignant neoplasm of rectum: Secondary | ICD-10-CM

## 2017-01-10 ENCOUNTER — Telehealth: Payer: Self-pay | Admitting: Family Medicine

## 2017-01-10 NOTE — Telephone Encounter (Signed)
Copied from [place] [id number]. Topic: Quick Communication - See Telephone Encounter >> Jan 10, 2017  1:15 PM Robina Ade, Helene Kelp D wrote: CRM for notification. See Telephone encounter for: 01/10/17. Patient needs the following change Insulin Syringe-Needle U-100 (BD INSULIN SYRINGE ULTRAFINE) 31G X 5/16" 0.3 ML MISC. Patient received 30 units that [employer] send but patient needs 100 units. Please fix the rx and send the correct to her pharmacy. Her pharmacy is [place], [place]. Please call patient back because she is not sure what to do with the one she has now.

## 2017-01-15 ENCOUNTER — Other Ambulatory Visit: Payer: Self-pay

## 2017-01-15 MED ORDER — "INSULIN SYRINGE-NEEDLE U-100 31G X 5/16"" 0.3 ML MISC": 1.0000 | Freq: Every day | 1 refills | Status: DC

## 2017-02-05 ENCOUNTER — Encounter: Payer: Self-pay | Admitting: Family Medicine

## 2017-02-05 ENCOUNTER — Ambulatory Visit (INDEPENDENT_AMBULATORY_CARE_PROVIDER_SITE_OTHER): Payer: Medicare HMO | Admitting: Family Medicine

## 2017-02-05 VITALS — BP 130/70 | HR 81 | Temp 98.3°F | Ht 60.0 in | Wt 154.9 lb

## 2017-02-05 DIAGNOSIS — Z23 Encounter for immunization: Secondary | ICD-10-CM | POA: Diagnosis not present

## 2017-02-05 DIAGNOSIS — I25810 Atherosclerosis of coronary artery bypass graft(s) without angina pectoris: Secondary | ICD-10-CM | POA: Diagnosis not present

## 2017-02-05 DIAGNOSIS — E1159 Type 2 diabetes mellitus with other circulatory complications: Secondary | ICD-10-CM

## 2017-02-05 LAB — POCT GLYCOSYLATED HEMOGLOBIN (HGB A1C): Hemoglobin A1C: 6.5

## 2017-02-05 NOTE — Patient Instructions (Signed)
Set up 6 month follow up  Check with [place] office- if that will be more convenient for you

## 2017-02-05 NOTE — Progress Notes (Signed)
Subjective:     Patient ID: [name], female   DOB: August 14, 1950, 67 y.o.   MRN: [id number]  HPI Patient seen for medical follow-up. She has chronic problems including history of type 2 diabetes, CAD, hyperlipidemia, hypertension, rectal cancer. She just moved to [place] to live with her son. She goes to cancer Center at [facility] and because of her change of location (residence) would like to consider switching practices to [facility] for convenience. She previously lived in [place].  She has had flu vaccine but needs Pneumovax. She's had previous Prevnar 13  Type 2 diabetes. On insulin and metformin. No recent hypoglycemia. Not monitoring blood sugars regularly. Poor compliance over the holidays.  She's had previous intolerance with statins. She had myalgias with several statins. She was more recently on rosuvastatin but quit taking this several months ago. She would like fasting follow-up lipids today.  Past Medical History:  Diagnosis Date  .  Paroxysmal SVT (ANVRT)    S/p AV nodal ablation Dr. Lovena Le 09/28/08   . CAD    Coronary disease status post non-ST elevation MI in May 2010 with  PCI of the left circumflex on Jun 24, 2008. She then had a PCI of  the RCA on July 15, 2008. 02/24/12 Cath, Severe 95% LAD, ostial 95% circ, ostial RCA, patent stents in distal RCA and mid circ normal LV 02/26/12 CABG with LIMA to LAD, SVG to RCA, and SVG to OM Dr. Roxan Hockey    . Cancer [facility])    adenocarcinoma of rectum  . Cataract   . Depression   . Diabetes mellitus, insulin dependent (IDDM), uncontrolled ([place])   . Heart murmur   . Hyperlipidemia   . Hypertensive heart disease   . S/P radiation therapy 04/11/14-05/18/14   50.4Gy rectal   Past Surgical History:  Procedure Laterality Date  . COLONOSCOPY    . COLONOSCOPY WITH PROPOFOL N/A 07/12/2014   Procedure: COLONOSCOPY WITH PROPOFOL POSSIBLE POLYP RESECTION;  Surgeon: [name], MD;  Location: [facility];  Service:  Endoscopy;  Laterality: N/A;   to be admitted after colonoscopy-robotic surgery on 6/8  . CORONARY ANGIOPLASTY WITH STENT PLACEMENT    . CORONARY ARTERY BYPASS GRAFT  02/26/2012   Roxan Hockey, MD;  Location: [facility];  Service: Open Heart Surgery;  Laterality: N/A;  CABG x three,  using left internal mammary artery  and left leg greater saphenous vein,   . ENDOVEIN HARVEST OF GREATER SAPHENOUS VEIN  02/26/2012  . EUS N/A 03/24/2014   Procedure: LOWER ENDOSCOPIC ULTRASOUND (EUS);  Surgeon: Milus Banister, MD;  Location: [facility];  Service: Endoscopy;  Laterality: N/A;  . LEFT HEART CATHETERIZATION WITH CORONARY ANGIOGRAM N/A 02/24/2012   Procedure: LEFT HEART CATHETERIZATION WITH CORONARY ANGIOGRAM;  Surgeon: Jacolyn Reedy, MD;  Location: [facility] CATH LAB;  Service: Cardiovascular;  Laterality: N/A;    reports that  has never smoked. she has never used smokeless tobacco. She reports that she does not drink alcohol or use drugs. family history includes Colon polyps in her maternal grandmother; Diabetes in her brother, father, and sister. Allergies  Allergen Reactions  . Rosuvastatin Other (See Comments)    Myalgia with several statins.     Review of Systems  Constitutional: Negative for fatigue.  Eyes: Negative for visual disturbance.  Respiratory: Negative for cough, chest tightness, shortness of breath and wheezing.   Cardiovascular: Negative for chest pain, palpitations and leg swelling.  Endocrine: Negative for polydipsia and polyuria.  Genitourinary: Negative for  dysuria.  Neurological: Negative for dizziness, seizures, syncope, weakness, light-headedness and headaches.       Objective:   Physical Exam  Constitutional: She appears well-developed and well-nourished.  Eyes: Pupils are equal, round, and reactive to light.  Neck: Neck supple. No JVD present. No thyromegaly present.  Cardiovascular: Normal rate and regular rhythm. Exam reveals no gallop.  Pulmonary/Chest: Effort normal  and breath sounds normal. No respiratory distress. She has no wheezes. She has no rales.  Musculoskeletal: She exhibits no edema.  Neurological: She is alert.  Skin:  Feet reveal no skin lesions. Good distal foot pulses. Good capillary refill. No calluses. Normal sensation with monofilament testing        Assessment:     #1 type 2 diabetes well controlled and stable at 6.5%  #2 hypertension stable  #3 dyslipidemia with history of intolerance with multiple statins  #4 history of CAD  #5 history of rectal cancer followed closely by oncology    Plan:     -Patient will look at possible transfer to [facility] for convenience now that she is living closer in that direction -Pneumovax given today -We recommended routine medical follow-up in 4-6 months -Recheck fasting lipids today. Might consider trial of low-dose Livalo-to see if she tolerates this better  Eulas Post MD [facility]

## 2017-02-10 DIAGNOSIS — R351 Nocturia: Secondary | ICD-10-CM | POA: Diagnosis not present

## 2017-02-10 DIAGNOSIS — R3915 Urgency of urination: Secondary | ICD-10-CM | POA: Diagnosis not present

## 2017-02-10 DIAGNOSIS — N3000 Acute cystitis without hematuria: Secondary | ICD-10-CM | POA: Diagnosis not present

## 2017-02-10 DIAGNOSIS — R350 Frequency of micturition: Secondary | ICD-10-CM | POA: Diagnosis not present

## 2017-02-13 ENCOUNTER — Other Ambulatory Visit: Payer: Self-pay | Admitting: Family Medicine

## 2017-02-28 ENCOUNTER — Other Ambulatory Visit: Payer: Self-pay | Admitting: Family Medicine

## 2017-03-03 DIAGNOSIS — R350 Frequency of micturition: Secondary | ICD-10-CM | POA: Diagnosis not present

## 2017-03-03 DIAGNOSIS — R3915 Urgency of urination: Secondary | ICD-10-CM | POA: Diagnosis not present

## 2017-03-03 DIAGNOSIS — R351 Nocturia: Secondary | ICD-10-CM | POA: Diagnosis not present

## 2017-03-05 ENCOUNTER — Other Ambulatory Visit: Payer: Self-pay | Admitting: Hematology & Oncology

## 2017-03-05 DIAGNOSIS — C20 Malignant neoplasm of rectum: Secondary | ICD-10-CM

## 2017-03-05 DIAGNOSIS — K8689 Other specified diseases of pancreas: Secondary | ICD-10-CM

## 2017-03-11 DIAGNOSIS — I2510 Atherosclerotic heart disease of native coronary artery without angina pectoris: Secondary | ICD-10-CM | POA: Diagnosis not present

## 2017-03-11 DIAGNOSIS — I10 Essential (primary) hypertension: Secondary | ICD-10-CM | POA: Diagnosis not present

## 2017-03-11 DIAGNOSIS — E7849 Other hyperlipidemia: Secondary | ICD-10-CM | POA: Diagnosis not present

## 2017-03-20 DIAGNOSIS — I2510 Atherosclerotic heart disease of native coronary artery without angina pectoris: Secondary | ICD-10-CM | POA: Diagnosis not present

## 2017-03-27 ENCOUNTER — Other Ambulatory Visit: Payer: Self-pay | Admitting: Hematology & Oncology

## 2017-03-28 ENCOUNTER — Other Ambulatory Visit: Payer: Self-pay | Admitting: *Deleted

## 2017-03-28 MED ORDER — DIPHENOXYLATE-ATROPINE 2.5-0.025 MG PO TABS: ORAL_TABLET | ORAL | 2 refills | Status: DC

## 2017-04-01 ENCOUNTER — Encounter: Payer: Self-pay | Admitting: Gastroenterology

## 2017-04-02 ENCOUNTER — Other Ambulatory Visit: Payer: Self-pay | Admitting: Family Medicine

## 2017-04-05 ENCOUNTER — Other Ambulatory Visit: Payer: Self-pay | Admitting: Hematology & Oncology

## 2017-04-05 DIAGNOSIS — K8689 Other specified diseases of pancreas: Secondary | ICD-10-CM

## 2017-04-05 DIAGNOSIS — C20 Malignant neoplasm of rectum: Secondary | ICD-10-CM

## 2017-04-11 ENCOUNTER — Encounter: Payer: Self-pay | Admitting: Gastroenterology

## 2017-04-21 ENCOUNTER — Other Ambulatory Visit: Payer: Self-pay | Admitting: Family Medicine

## 2017-05-29 ENCOUNTER — Inpatient Hospital Stay: Payer: Medicare HMO | Attending: Hematology & Oncology

## 2017-05-29 ENCOUNTER — Inpatient Hospital Stay (HOSPITAL_BASED_OUTPATIENT_CLINIC_OR_DEPARTMENT_OTHER): Payer: Medicare HMO | Admitting: Family

## 2017-05-29 ENCOUNTER — Ambulatory Visit (INDEPENDENT_AMBULATORY_CARE_PROVIDER_SITE_OTHER): Payer: Medicare HMO | Admitting: Family Medicine

## 2017-05-29 ENCOUNTER — Encounter: Payer: Self-pay | Admitting: Family Medicine

## 2017-05-29 VITALS — BP 132/68 | HR 69 | Temp 98.6°F | Ht 60.0 in | Wt 152.0 lb

## 2017-05-29 VITALS — BP 138/54 | HR 69 | Temp 98.3°F | Resp 18 | Wt 153.0 lb

## 2017-05-29 DIAGNOSIS — Z9221 Personal history of antineoplastic chemotherapy: Secondary | ICD-10-CM | POA: Diagnosis not present

## 2017-05-29 DIAGNOSIS — R300 Dysuria: Secondary | ICD-10-CM | POA: Insufficient documentation

## 2017-05-29 DIAGNOSIS — E1159 Type 2 diabetes mellitus with other circulatory complications: Secondary | ICD-10-CM | POA: Diagnosis not present

## 2017-05-29 DIAGNOSIS — Z85048 Personal history of other malignant neoplasm of rectum, rectosigmoid junction, and anus: Secondary | ICD-10-CM | POA: Diagnosis not present

## 2017-05-29 DIAGNOSIS — C20 Malignant neoplasm of rectum: Secondary | ICD-10-CM

## 2017-05-29 DIAGNOSIS — R42 Dizziness and giddiness: Secondary | ICD-10-CM | POA: Diagnosis not present

## 2017-05-29 DIAGNOSIS — E785 Hyperlipidemia, unspecified: Secondary | ICD-10-CM

## 2017-05-29 DIAGNOSIS — E559 Vitamin D deficiency, unspecified: Secondary | ICD-10-CM

## 2017-05-29 DIAGNOSIS — R3915 Urgency of urination: Secondary | ICD-10-CM | POA: Diagnosis not present

## 2017-05-29 DIAGNOSIS — Z923 Personal history of irradiation: Secondary | ICD-10-CM | POA: Insufficient documentation

## 2017-05-29 DIAGNOSIS — D508 Other iron deficiency anemias: Secondary | ICD-10-CM

## 2017-05-29 LAB — CBC WITH DIFFERENTIAL (CANCER CENTER ONLY)
Basophils Absolute: 0 10*3/uL (ref 0.0–0.1)
Basophils Relative: 0 %
EOS ABS: 0.1 10*3/uL (ref 0.0–0.5)
EOS PCT: 2 %
HCT: 36.5 % (ref 34.8–46.6)
Hemoglobin: 12.8 g/dL (ref 11.6–15.9)
LYMPHS ABS: 1.1 10*3/uL (ref 0.9–3.3)
Lymphocytes Relative: 21 %
MCH: 33 pg (ref 26.0–34.0)
MCHC: 35.1 g/dL (ref 32.0–36.0)
MCV: 94.1 fL (ref 81.0–101.0)
MONO ABS: 0.5 10*3/uL (ref 0.1–0.9)
MONOS PCT: 10 %
Neutro Abs: 3.4 10*3/uL (ref 1.5–6.5)
Neutrophils Relative %: 67 %
PLATELETS: 143 10*3/uL — AB (ref 145–400)
RBC: 3.88 MIL/uL (ref 3.70–5.32)
RDW: 13.4 % (ref 11.1–15.7)
WBC: 5.1 10*3/uL (ref 3.9–10.0)

## 2017-05-29 LAB — CMP (CANCER CENTER ONLY)
ALBUMIN: 3.8 g/dL (ref 3.5–5.0)
ALK PHOS: 46 U/L (ref 40–150)
ALT: 33 U/L (ref 0–55)
ANION GAP: 11 (ref 3–11)
AST: 21 U/L (ref 5–34)
BILIRUBIN TOTAL: 0.3 mg/dL (ref 0.2–1.2)
BUN: 19 mg/dL (ref 7–26)
CALCIUM: 9.9 mg/dL (ref 8.4–10.4)
CO2: 23 mmol/L (ref 22–29)
CREATININE: 0.74 mg/dL (ref 0.60–1.10)
Chloride: 105 mmol/L (ref 98–109)
GFR, Est AFR Am: >60 mL/min (ref >60)
GFR, Estimated: >60 mL/min (ref >60)
GLUCOSE: 178 mg/dL — AB (ref 70–140)
Potassium: 4.5 mmol/L (ref 3.5–5.1)
Sodium: 139 mmol/L (ref 136–145)
TOTAL PROTEIN: 7 g/dL (ref 6.4–8.3)

## 2017-05-29 LAB — IRON AND TIBC
Iron: 97 ug/dL (ref 41–142)
SATURATION RATIOS: 30 % (ref 21–57)
TIBC: 323 ug/dL (ref 236–444)
UIBC: 226 ug/dL

## 2017-05-29 LAB — FERRITIN: FERRITIN: 54 ng/mL (ref 9–269)

## 2017-05-29 LAB — LACTATE DEHYDROGENASE: LDH: 200 U/L (ref 125–245)

## 2017-05-29 LAB — CEA (IN HOUSE-CHCC): CEA (CHCC-In House): <1 ng/mL (ref 0.00–5.00)

## 2017-05-29 MED ORDER — "INSULIN SYRINGE 31G X 5/16"" 1 ML MISC": 1 refills | Status: DC

## 2017-05-29 MED ORDER — FESOTERODINE FUMARATE ER 4 MG PO TB24: 4.0000 mg | ORAL_TABLET | Freq: Every day | ORAL | 1 refills | Status: DC

## 2017-05-29 NOTE — Patient Instructions (Addendum)
Make sure you set up your diabetic eye exam.   Make sure you set up your mammogram.   If you do not hear anything about your referral in the next 1-2 weeks, call our office and ask for an update.  Come to your lab appointment fasting.   Let us know if you need anything.

## 2017-05-29 NOTE — Progress Notes (Signed)
Chief Complaint  Patient presents with  . Establish Care       New Patient Visit SUBJECTIVE: HPI: [name] is an 67 y.o.female who is being seen for establishing care.  The patient was previously seen at Dr. Erick Blinks office- she has moved and this office is a better location for her.  DM II requiring insulin, has had syringes that are too small.  Her last A1c was in March 2018.  She is due for an eye exam and her current eye provider has been contacting her.  She is statin intolerant.  She has received both pneumonia vaccinations.  She has a history of dysuria and urgency.  She has been seen by the urology team at [facility].  She was prescribed prophylactic antibiotics but insurance was not interested in paying for this in the pharmacy required paperwork from the urologist.  Per the patient, the urology team has not been compliant with this recommendation and when she called their office, she was told to follow-up with her PCP.  Because of this, she would like to see a new provider.  The patient has a history of colon cancer.  After a colectomy, she had hearing loss and vertigo.  She saw an ear nose and throat provider in [place] who told her there was nothing that could be done.  She feels things are getting worse and would like to see another provider regarding this.  Allergies  Allergen Reactions  . Rosuvastatin Other (See Comments)    Myalgia with several statins.    Past Medical History:  Diagnosis Date  .  Paroxysmal SVT (ANVRT)    S/p AV nodal ablation Dr. Lovena Le 09/28/08   . CAD    Coronary disease status post non-ST elevation MI in May 2010 with  PCI of the left circumflex on Jun 24, 2008. She then had a PCI of  the RCA on July 15, 2008. 02/24/12 Cath, Severe 95% LAD, ostial 95% circ, ostial RCA, patent stents in distal RCA and mid circ normal LV 02/26/12 CABG with LIMA to LAD, SVG to RCA, and SVG to OM Dr. Roxan Hockey    . Cancer [facility])    adenocarcinoma of rectum  .  Cataract   . Depression   . Diabetes mellitus, insulin dependent (IDDM), uncontrolled ([place])   . Heart murmur   . Hyperlipidemia   . Hypertensive heart disease   . S/P radiation therapy 04/11/14-05/18/14   50.4Gy rectal  . Vertigo    Result of chemotherapy; has been to ENT and told nothing to be done   Past Surgical History:  Procedure Laterality Date  . COLONOSCOPY    . COLONOSCOPY WITH PROPOFOL N/A 07/12/2014   Procedure: COLONOSCOPY WITH PROPOFOL POSSIBLE POLYP RESECTION;  Surgeon: [name], MD;  [facility];  Service: Endoscopy;  Laterality: N/A;   to be admitted after colonoscopy-robotic surgery on 6/8  . CORONARY ANGIOPLASTY WITH STENT PLACEMENT    . CORONARY ARTERY BYPASS GRAFT  02/26/2012   Roxan Hockey, MD;  Location: [facility];  Service: Open Heart Surgery;  Laterality: N/A;  CABG x three,  using left internal mammary artery  and left leg greater saphenous vein,   . ENDOVEIN HARVEST OF GREATER SAPHENOUS VEIN  02/26/2012  . EUS N/A 03/24/2014   Procedure: LOWER ENDOSCOPIC ULTRASOUND (EUS);  Surgeon: Milus Banister, MD;  Location: [facility];  Service: Endoscopy;  Laterality: N/A;  . LEFT HEART CATHETERIZATION WITH CORONARY ANGIOGRAM N/A 02/24/2012   Procedure: LEFT HEART CATHETERIZATION WITH CORONARY  ANGIOGRAM;  Surgeon: Jacolyn Reedy, MD;  Location: [facility] CATH LAB;  Service: Cardiovascular;  Laterality: N/A;    Family History  Problem Relation Age of Onset  . Diabetes Father   . Diabetes Brother   . Diabetes Sister   . Colon polyps Maternal Grandmother   . Colon cancer Neg Hx      Current Outpatient Medications:  .  aspirin 81 MG tablet, Take 81 mg by mouth at bedtime. , Disp: , Rfl:  .  Blood Glucose Monitoring Suppl (ACCU-CHEK NANO SMARTVIEW) w/Device KIT, Test once daily E11.59, Disp: 1 kit, Rfl: prn .  CREON 24000-76000 units CPEP, TAKE 1 CAPSULE BY MOUTH THREE TIMES DAILY WITH MEALS, Disp: 90 capsule, Rfl: 0 .  diphenoxylate-atropine (LOMOTIL) 2.5-0.025 MG  tablet, TAKE 1 TABLET BY MOUTH FOUR TIMES DAILY AS NEEDED FOR DIARRHEA/ LOOSE STOOL, Disp: 100 tablet, Rfl: 2 .  glucose blood (ACCU-CHEK SMARTVIEW) test strip, Test once daily E11.59, Disp: 100 each, Rfl: 12 .  LANTUS 100 UNIT/ML injection, INJECT 88 UNITS UNDER THE SKIN AT BEDTIME, Disp: 30 mL, Rfl: 5 .  metFORMIN (GLUCOPHAGE) 1000 MG tablet, TAKE 1 TABLET BY MOUTH TWICE DAILY WITH A MEAL, Disp: 180 tablet, Rfl: 1 .  metoprolol tartrate (LOPRESSOR) 25 MG tablet, TAKE 1 TABLET BY MOUTH TWICE DAILY, Disp: 180 tablet, Rfl: 1 .  Multiple Vitamins-Minerals (WOMENS MULTIVITAMIN PO), Take by mouth., Disp: , Rfl:  .  sertraline (ZOLOFT) 50 MG tablet, TAKE 1 TABLET(50MG TOTAL) BY MOUTH AT BEDTIME, Disp: 90 tablet, Rfl: 1 .  Vitamin D, Ergocalciferol, (DRISDOL) 50000 units CAPS capsule, TAKE 1 CAPSULE BY MOUTH 1 TIME A WEEK, Disp: 12 capsule, Rfl: 0 .  zolpidem (AMBIEN) 10 MG tablet, TAKE 1 TABLET BY MOUTH AT BEDTIME AS NEEDED FOR SLEEP, Disp: 15 tablet, Rfl: 0 .  conjugated estrogens (PREMARIN) vaginal cream, Apply pea size amount daily for 1 week then 2-3 times per week., Disp: 42.5 g, Rfl: 12 .  fesoterodine (TOVIAZ) 4 MG TB24 tablet, Take 1 tablet (4 mg total) by mouth daily., Disp: 90 tablet, Rfl: 1 .  Insulin Syringe-Needle U-100 (INSULIN SYRINGE 1CC/31GX5/16") 31G X 5/16" 1 ML MISC, Use with 88 units of Lantus nightly., Disp: 100 each, Rfl: 1  ROS Cardiovascular: Denies chest pain  Respiratory: Denies dyspnea   OBJECTIVE: BP 132/68 (BP Location: Left Arm, Patient Position: Sitting, Cuff Size: Normal)   Pulse 69   Temp 98.6 F (37 C) (Oral)   Ht 5' (1.524 m)   Wt 152 lb (68.9 kg)   SpO2 94%   BMI 29.69 kg/m   Constitutional: -  VS reviewed -  Well developed, well nourished, appears stated age -  No apparent distress  Psychiatric: -  Oriented to person, place, and time -  Memory intact -  Affect and mood normal -  Fluent conversation, good eye contact -  Judgment and insight age  appropriate  Eye: -  Conjunctivae clear, no discharge -  Pupils symmetric, round, reactive to light  ENMT: -  MMM    Pharynx moist, no exudate, no erythema  Neck: -  No gross swelling, no palpable masses -  Thyroid midline, not enlarged, mobile, no palpable masses  Cardiovascular: -  RRR -  No LE edema  Respiratory: -  Normal respiratory effort, no accessory muscle use, no retraction -  Breath sounds equal, no wheezes, no ronchi, no crackles  Skin: -  No significant lesion on inspection -  Warm and dry to palpation  ASSESSMENT/PLAN: Type 2 diabetes mellitus with vascular disease ([place]) - Plan: Microalbumin / creatinine urine ratio, Lipid panel, Hemoglobin A1c, Comprehensive metabolic panel, CBC  Urinary urgency - Plan: Ambulatory referral to Urology  Dysuria - Plan: Ambulatory referral to Urology  Hyperlipidemia, unspecified hyperlipidemia type  Vertigo - Plan: Ambulatory referral to ENT  Check future fasting labs as above. I will call in the medicine for her urinary urgency.  I encouraged her to call Dr. Gilford Rile office again, however will comply with the request for a referral to a different group. We will also comply with a referral to another ENT office. Patient should return in 6 mo pending results of labs or prn. The patient voiced understanding and agreement to the plan.   [place], DO 05/29/17  3:05 PM

## 2017-05-29 NOTE — Progress Notes (Signed)
Pre visit review using our clinic review tool, if applicable. No additional management support is needed unless otherwise documented below in the visit note. 

## 2017-05-29 NOTE — Progress Notes (Signed)
Hematology and Oncology Follow Up Visit  [name] [id number] 1950-04-22 67 y.o. 05/29/2017   Principle Diagnosis:  Stage IIA (T3N0M0) rectal cancer - s/p neoadjuvant chemo/radiation therapy and surgery in 07/2014  Current Therapy:   Observation   Interim History:  [name] is here today for follow-up. She is doing fairly well but is feeling fatigued and "draggy" lately. She has a new patient appointment with Dr. Nani Ravens downstairs with [facility] this afternoon and plans to discuss this with him.  We will see what her iron studies and vitamin D level show. If either is low that would certainly contribute to her symptoms.  CEA was 1.06 last October. Today's result is pending.  She has had no issue with infections. No fever, chills, n/v, cough, rash, dizziness, SOB, chest pain, palpitations, abdominal pain or changes in bowel or bladder habits.  She is seeing urology for an inflamed bladder. She is on an antibiotic as well as Premarin cream.  No episodes of bleeding, no bruising or petechiae. No lymphadenopathy found on exam.  No swelling, tenderness, numbness or tingling in her extremities. No c/o pain.  She states that her blood sugars are now well controlled. She has maintained a good appetite and is staying well hydrated. Her weight is stable.   ECOG Performance Status: 0 - Asymptomatic  Medications:  Allergies as of 05/29/2017      Reactions   Rosuvastatin Other (See Comments)   Myalgia with several statins.      Medication List        Accurate as of 05/29/17 11:13 AM. Always use your most recent med list.          ACCU-CHEK NANO SMARTVIEW w/Device Kit Test once daily E11.59   aspirin 81 MG tablet Take 81 mg by mouth at bedtime.   CREON 24000-76000 units Cpep Generic drug:  Pancrelipase (Lip-Prot-Amyl) TAKE 1 CAPSULE BY MOUTH THREE TIMES DAILY WITH MEALS   diphenoxylate-atropine 2.5-0.025 MG tablet Commonly known as:  LOMOTIL TAKE 1 TABLET BY  MOUTH FOUR TIMES DAILY AS NEEDED FOR DIARRHEA/ LOOSE STOOL   FLUAD 0.5 ML Susy Generic drug:  Influenza Vac A&B Surf Ant Adj ADM 0.5ML IM UTD   glucose blood test strip Commonly known as:  ACCU-CHEK SMARTVIEW Test once daily E11.59   Insulin Syringe-Needle U-100 31G X 5/16" 0.3 ML Misc Commonly known as:  BD INSULIN SYRINGE U/F Inject 1 Syringe into the skin daily.   LANTUS 100 UNIT/ML injection Generic drug:  insulin glargine INJECT 96 UNITS SUBCUTAENOUSLY EVERY NIGHT AT BEDTIME   LANTUS 100 UNIT/ML injection Generic drug:  insulin glargine INJECT 88 UNITS UNDER THE SKIN AT BEDTIME   metFORMIN 1000 MG tablet Commonly known as:  GLUCOPHAGE TAKE 1 TABLET BY MOUTH TWICE DAILY WITH A MEAL   metoprolol tartrate 25 MG tablet Commonly known as:  LOPRESSOR TAKE 1 TABLET BY MOUTH TWICE DAILY   NOVOLOG 100 UNIT/ML injection Generic drug:  insulin aspart INJECT 10 UNITS INTO THE SKIN THREE TIMES DAILY WITH MEALS   sertraline 50 MG tablet Commonly known as:  ZOLOFT TAKE 1 TABLET(50MG TOTAL) BY MOUTH AT BEDTIME   solifenacin 10 MG tablet Commonly known as:  VESICARE Take 1 tablet (10 mg total) by mouth daily.   sulfamethoxazole-trimethoprim 800-160 MG tablet Commonly known as:  BACTRIM DS,SEPTRA DS TK 1 T PO  BID   Vitamin D (Ergocalciferol) 50000 units Caps capsule Commonly known as:  DRISDOL TAKE 1 CAPSULE BY MOUTH 1 TIME A WEEK  WOMENS MULTIVITAMIN PO Take by mouth.   zolpidem 10 MG tablet Commonly known as:  AMBIEN TAKE 1 TABLET BY MOUTH AT BEDTIME AS NEEDED FOR SLEEP       Allergies:  Allergies  Allergen Reactions  . Rosuvastatin Other (See Comments)    Myalgia with several statins.    Past Medical History, Surgical history, Social history, and Family History were reviewed and updated.  Review of Systems: All other 10 point review of systems is negative.   Physical Exam:  vitals were not taken for this visit.   Wt Readings from Last 3 Encounters:    02/05/17 154 lb 14.4 oz (70.3 kg)  12/03/16 153 lb (69.4 kg)  09/17/16 157 lb 12.8 oz (71.6 kg)    Ocular: Sclerae unicteric, pupils equal, round and reactive to light Ear-nose-throat: Oropharynx clear, dentition fair Lymphatic: No cervical, supraclavicular or axillary adenopathy Lungs no rales or rhonchi, good excursion bilaterally Heart regular rate and rhythm, no murmur appreciated Abd soft, nontender, positive bowel sounds, no liver or spleen tip palpated on exam, no fluid wave  MSK no focal spinal tenderness, no joint edema Neuro: non-focal, well-oriented, appropriate affect Breasts: Deferred   Lab Results  Component Value Date   WBC 5.1 05/29/2017   HGB 12.8 05/29/2017   HCT 36.5 05/29/2017   MCV 94.1 05/29/2017   PLT 143 (L) 05/29/2017   Lab Results  Component Value Date   FERRITIN 31 04/22/2016   Lab Results  Component Value Date   RETICCTPCT 4.51 (H) 05/30/2014   RBC 3.88 05/29/2017   RETICCTABS 137.10 (H) 05/30/2014   No results found for: KPAFRELGTCHN, LAMBDASER, KAPLAMBRATIO No results found for: IGGSERUM, IGA, IGMSERUM No results found for: Odetta Pink, SPEI   Chemistry      Component Value Date/Time   NA 145 12/03/2016 1134   NA 138 05/28/2016 1139   K 4.6 12/03/2016 1134   K 4.7 05/28/2016 1139   CL 105 12/03/2016 1134   CO2 26 12/03/2016 1134   CO2 25 05/28/2016 1139   BUN 12 12/03/2016 1134   BUN 17.1 05/28/2016 1139   CREATININE 0.7 12/03/2016 1134   CREATININE 0.8 05/28/2016 1139      Component Value Date/Time   CALCIUM 9.8 12/03/2016 1134   CALCIUM 9.5 05/28/2016 1139   ALKPHOS 51 12/03/2016 1134   ALKPHOS 44 05/28/2016 1139   AST 32 12/03/2016 1134   AST 16 05/28/2016 1139   ALT 39 12/03/2016 1134   ALT 23 05/28/2016 1139   BILITOT 0.50 12/03/2016 1134   BILITOT 0.35 05/28/2016 1139      Impression and Plan: [name] is a very pleasant 67 yo caucasian female with history of  stage IIa rectal cancer treated with chemo/radiation/surgery in 2016. She has done well and so far there has been no evidence recurrence. Her CEA has been stable. Today's result is pending.  We will see what her iron studies and vitamin D result shows and replace as indicated.  We will plan to see her back in another 6 months for follow-up.  She will contact our office with any questions or concerns. We can certainly see her sooner if need be.   Laverna Peace, NP 4/25/201911:13 AM

## 2017-05-30 LAB — VITAMIN D 25 HYDROXY (VIT D DEFICIENCY, FRACTURES): VIT D 25 HYDROXY: 32.4 ng/mL (ref 30.0–100.0)

## 2017-06-03 ENCOUNTER — Other Ambulatory Visit (INDEPENDENT_AMBULATORY_CARE_PROVIDER_SITE_OTHER): Payer: Medicare HMO

## 2017-06-03 ENCOUNTER — Telehealth: Payer: Self-pay | Admitting: Family Medicine

## 2017-06-03 ENCOUNTER — Other Ambulatory Visit: Payer: Medicare HMO

## 2017-06-03 ENCOUNTER — Ambulatory Visit: Payer: Medicare HMO | Admitting: Family

## 2017-06-03 DIAGNOSIS — E1159 Type 2 diabetes mellitus with other circulatory complications: Secondary | ICD-10-CM

## 2017-06-03 LAB — LIPID PANEL
CHOL/HDL RATIO: 5
Cholesterol: 162 mg/dL (ref 0–200)
HDL: 33.9 mg/dL — ABNORMAL LOW (ref >39.00)
LDL CALC: 90 mg/dL (ref 0–99)
NonHDL: 128.41
TRIGLYCERIDES: 192 mg/dL — AB (ref 0.0–149.0)
VLDL: 38.4 mg/dL (ref 0.0–40.0)

## 2017-06-03 LAB — CBC
HEMATOCRIT: 36.3 % (ref 36.0–46.0)
HEMOGLOBIN: 12.8 g/dL (ref 12.0–15.0)
MCHC: 35.2 g/dL (ref 30.0–36.0)
MCV: 92.5 fl (ref 78.0–100.0)
Platelets: 150 10*3/uL (ref 150.0–400.0)
RBC: 3.92 Mil/uL (ref 3.87–5.11)
RDW: 13.6 % (ref 11.5–15.5)
WBC: 4.6 10*3/uL (ref 4.0–10.5)

## 2017-06-03 LAB — COMPREHENSIVE METABOLIC PANEL
ALK PHOS: 37 U/L — AB (ref 39–117)
ALT: 23 U/L (ref 0–35)
AST: 15 U/L (ref 0–37)
Albumin: 4 g/dL (ref 3.5–5.2)
BUN: 16 mg/dL (ref 6–23)
CO2: 31 meq/L (ref 19–32)
Calcium: 9.5 mg/dL (ref 8.4–10.5)
Chloride: 103 mEq/L (ref 96–112)
Creatinine, Ser: 0.57 mg/dL (ref 0.40–1.20)
GFR: 112.04 mL/min (ref >60.00)
GLUCOSE: 76 mg/dL (ref 70–99)
Potassium: 4.7 mEq/L (ref 3.5–5.1)
Sodium: 142 mEq/L (ref 135–145)
Total Bilirubin: 0.4 mg/dL (ref 0.2–1.2)
Total Protein: 6.7 g/dL (ref 6.0–8.3)

## 2017-06-03 LAB — MICROALBUMIN / CREATININE URINE RATIO
Creatinine,U: 89.7 mg/dL
MICROALB/CREAT RATIO: 1.6 mg/g (ref 0.0–30.0)
Microalb, Ur: 1.4 mg/dL (ref 0.0–1.9)

## 2017-06-03 LAB — HEMOGLOBIN A1C: Hgb A1c MFr Bld: 6.2 % (ref 4.6–6.5)

## 2017-06-03 NOTE — Telephone Encounter (Signed)
Patient was in office today having labs drawn. Lab tech stated that pt seemed confused as she thought she had an appt right after having her labs drawn, with Judson Roch. Pt was advised that Sarah -oncologist was on the third floor, pt state she had never been on the third flood & that Judson Roch was in our office. Pt was seen at [facility] on 4/25- & had labs drawn that day. Cancer center confirmed that patient is not due to follow up with Sarah until 10/15. Advised patient on her upcoming appts & gave her a list of her appts- patient did not review the print out & left. She still seemed like she was confused.

## 2017-06-03 NOTE — Telephone Encounter (Signed)
Called left message to call back 

## 2017-06-03 NOTE — Telephone Encounter (Signed)
Some of the labs will be helpful, but I think that she should be seen by someone. Can we check in with her this afternoon if she is already gone? TY.

## 2017-06-03 NOTE — Telephone Encounter (Signed)
Below is the message from [place].(Glass blower/designer) The lab alerted her to this patient seeming extremely confused today The patient seemed unaware of why she would be seeing an oncologist and she was confused about her lab visit today. orginally in this office thought her labs today were ordered by Judson Roch (works in oncology) and after being explained to her where Judson Roch was out of the blue she said I am here for Dr. Nani Ravens. So, they were concerned that her confusion was unusual and felt it needed to be addressed.

## 2017-06-04 MED ORDER — ATORVASTATIN CALCIUM 10 MG PO TABS: 10.0000 mg | ORAL_TABLET | Freq: Every day | ORAL | 3 refills | Status: DC

## 2017-06-04 NOTE — Telephone Encounter (Signed)
Called the patient left message to call back. Called the patients sister told her to have her give [place] a call.

## 2017-06-04 NOTE — Telephone Encounter (Signed)
The patient called back and did inform of her lab results/PCP sent mychart message. She stated she will think about starting a new medication (Zetia) suggested by PCP. We had a nice conversation.  She had not returned my phone call before today because she had not been home. She did remember being here yesterday for labs and was doing well and had no other problems. Conversation went well and patient answered all questions well.

## 2017-06-04 NOTE — Addendum Note (Signed)
Addended by: Ames Coupe on: 06/04/2017 04:55 PM   Modules accepted: Orders

## 2017-06-04 NOTE — Telephone Encounter (Signed)
I called in a low dose to start with. I think this will be helpful for her moving forward. TY.

## 2017-06-04 NOTE — Telephone Encounter (Signed)
Pt states that she has made the decision to try the atorvastatin again. It was the first one that she took. She doesn't remember having any problems when she was taking it. She recalls coming off of it because it was getting bad press (the entire statin family). Then she said she tried the rosuvastatin. She stopped taking it mostly due to the bad press.    [facility] [place], [place] - [address] [phone] (Phone) [phone] (Fax)

## 2017-06-05 ENCOUNTER — Ambulatory Visit: Payer: Medicare HMO | Admitting: Family Medicine

## 2017-06-16 ENCOUNTER — Telehealth: Payer: Self-pay | Admitting: Family Medicine

## 2017-06-16 DIAGNOSIS — R3915 Urgency of urination: Secondary | ICD-10-CM

## 2017-06-16 DIAGNOSIS — R300 Dysuria: Secondary | ICD-10-CM

## 2017-06-16 MED ORDER — URIBEL 118 MG PO CAPS: ORAL_CAPSULE | ORAL | 1 refills | Status: DC

## 2017-06-16 NOTE — Telephone Encounter (Signed)
Patient informed. 

## 2017-06-16 NOTE — Telephone Encounter (Signed)
Copied from [place] [id number]. Topic: General - Other >> Jun 16, 2017 11:34 AM Yvette Rack wrote: Reason for CRM: patient states that [place] was supposed to have called her something into her pharmacist for her bladder

## 2017-06-16 NOTE — Telephone Encounter (Signed)
Called in a new med to try to help while she gets set up with urology. TY.

## 2017-06-16 NOTE — Telephone Encounter (Signed)
Referral done Called the patient and she did get the [place]. She states she still has a bladder infection and that is what is bothering her currently. She states she has had one all year long.  She said that PCP knows about this as was discussed at her last OV in April

## 2017-06-16 NOTE — Addendum Note (Signed)
Addended by: Ames Coupe on: 06/16/2017 01:18 PM   Modules accepted: Orders

## 2017-06-16 NOTE — Telephone Encounter (Signed)
Copied from [place] [id number]. Topic: Referral - Request >> Jun 16, 2017 11:29 AM Yvette Rack wrote: Reason for CRM: pt calling stating that she has a inflamed bladder and would like to be referred to a urologist no [facility] urologist some where closest to her home   >> Jun 16, 2017 11:39 AM Yvette Rack wrote: This message was for Dr Nani Ravens for pt to be referred to a Urologist

## 2017-06-16 NOTE — Addendum Note (Signed)
Addended by: Sharon Seller B on: 06/16/2017 01:15 PM   Modules accepted: Orders

## 2017-06-16 NOTE — Telephone Encounter (Signed)
Copied from [place] [id number]. Topic: Quick Communication - Rx Refill/Question >> Jun 16, 2017  5:02 PM Oliver Pila B wrote: Medication: Meth-Hyo-M Bl-Na Phos-Ph Sal (URIBEL) 118 MG CAPS [[id number]]  Pt called b/c pharmacy asked her to call b/c insurance will not pay for the medication, the medication is over $500, pt needs alternative medication, call pt to advise

## 2017-06-16 NOTE — Telephone Encounter (Signed)
OK to refer, did she get the [place]?

## 2017-06-17 ENCOUNTER — Other Ambulatory Visit: Payer: Self-pay

## 2017-06-17 ENCOUNTER — Ambulatory Visit (AMBULATORY_SURGERY_CENTER): Payer: Self-pay

## 2017-06-17 VITALS — Ht 60.0 in | Wt 152.4 lb

## 2017-06-17 DIAGNOSIS — C20 Malignant neoplasm of rectum: Secondary | ICD-10-CM

## 2017-06-17 MED ORDER — NA SULFATE-K SULFATE-MG SULF 17.5-3.13-1.6 GM/177ML PO SOLN: 1.0000 | Freq: Once | ORAL | 0 refills | Status: AC

## 2017-06-17 NOTE — Telephone Encounter (Signed)
I tried doing PA for this medication- however Covermymeds and Pt's insurance was not familiar with this medication.

## 2017-06-17 NOTE — Progress Notes (Signed)
Denies allergies to eggs or soy products. Denies complication of anesthesia or sedation. Denies use of weight loss medication. Denies use of O2.   Emmi instructions declined.  

## 2017-06-18 MED ORDER — METHENAMINE HIPPURATE 1 G PO TABS: 1.0000 g | ORAL_TABLET | Freq: Two times a day (BID) | ORAL | 0 refills | Status: DC

## 2017-06-18 NOTE — Telephone Encounter (Signed)
CMP reviewed. Hippurate until she can get in w urology. TY.

## 2017-06-27 ENCOUNTER — Telehealth: Payer: Self-pay

## 2017-06-27 NOTE — Telephone Encounter (Signed)
Needs ENT referral from [facility] practice, approved by Dr. Nani Ravens. Please advise.  Copied from [place] [id number]. Topic: Referral - Question >> Jun 25, 2017 10:34 AM Clack, Laban Emperor wrote: Reason for CRM: Pt would like to know if she could get a referral for ENT, closer to her home. (maybe in [place])  >> Jun 25, 2017 11:33 AM Carole Binning B wrote: This is not a [facility] patient, this is a [facility] patient.  >> Jun 26, 2017  3:25 PM Shelda Pal, DO wrote: OK to refer closer to [facility]. TY.

## 2017-06-28 ENCOUNTER — Other Ambulatory Visit: Payer: Self-pay | Admitting: Hematology & Oncology

## 2017-06-28 DIAGNOSIS — C20 Malignant neoplasm of rectum: Secondary | ICD-10-CM

## 2017-06-28 DIAGNOSIS — K8689 Other specified diseases of pancreas: Secondary | ICD-10-CM

## 2017-07-01 ENCOUNTER — Encounter: Payer: Self-pay | Admitting: Gastroenterology

## 2017-07-01 ENCOUNTER — Ambulatory Visit (AMBULATORY_SURGERY_CENTER): Payer: Medicare HMO | Admitting: Gastroenterology

## 2017-07-01 ENCOUNTER — Other Ambulatory Visit: Payer: Self-pay | Admitting: Family Medicine

## 2017-07-01 VITALS — BP 144/73 | HR 91 | Temp 98.4°F | Resp 13 | Ht 60.0 in | Wt 154.0 lb

## 2017-07-01 DIAGNOSIS — D123 Benign neoplasm of transverse colon: Secondary | ICD-10-CM | POA: Diagnosis not present

## 2017-07-01 DIAGNOSIS — Z1211 Encounter for screening for malignant neoplasm of colon: Secondary | ICD-10-CM | POA: Diagnosis not present

## 2017-07-01 DIAGNOSIS — Z1212 Encounter for screening for malignant neoplasm of rectum: Secondary | ICD-10-CM

## 2017-07-01 DIAGNOSIS — E119 Type 2 diabetes mellitus without complications: Secondary | ICD-10-CM | POA: Diagnosis not present

## 2017-07-01 DIAGNOSIS — I2510 Atherosclerotic heart disease of native coronary artery without angina pectoris: Secondary | ICD-10-CM | POA: Diagnosis not present

## 2017-07-01 DIAGNOSIS — R42 Dizziness and giddiness: Secondary | ICD-10-CM

## 2017-07-01 DIAGNOSIS — E669 Obesity, unspecified: Secondary | ICD-10-CM | POA: Diagnosis not present

## 2017-07-01 DIAGNOSIS — I10 Essential (primary) hypertension: Secondary | ICD-10-CM | POA: Diagnosis not present

## 2017-07-01 DIAGNOSIS — Z85048 Personal history of other malignant neoplasm of rectum, rectosigmoid junction, and anus: Secondary | ICD-10-CM

## 2017-07-01 MED ORDER — SODIUM CHLORIDE 0.9 % IV SOLN: 500.0000 mL | Freq: Once | INTRAVENOUS | Status: DC

## 2017-07-01 NOTE — Telephone Encounter (Signed)
Will do referral

## 2017-07-01 NOTE — Patient Instructions (Signed)
HANDOUT GIVEN : POLYPS   YOU HAD AN ENDOSCOPIC PROCEDURE TODAY AT THE [facility]:   Refer to the procedure report that was given to you for any specific questions about what was found during the examination.  If the procedure report does not answer your questions, please call your gastroenterologist to clarify.  If you requested that your care partner not be given the details of your procedure findings, then the procedure report has been included in a sealed envelope for you to review at your convenience later.  YOU SHOULD EXPECT: Some feelings of bloating in the abdomen. Passage of more gas than usual.  Walking can help get rid of the air that was put into your GI tract during the procedure and reduce the bloating. If you had a lower endoscopy (such as a colonoscopy or flexible sigmoidoscopy) you may notice spotting of blood in your stool or on the toilet paper. If you underwent a bowel prep for your procedure, you may not have a normal bowel movement for a few days.  Please Note:  You might notice some irritation and congestion in your nose or some drainage.  This is from the oxygen used during your procedure.  There is no need for concern and it should clear up in a day or so.  SYMPTOMS TO REPORT IMMEDIATELY:   Following lower endoscopy (colonoscopy or flexible sigmoidoscopy):  Excessive amounts of blood in the stool  Significant tenderness or worsening of abdominal pains  Swelling of the abdomen that is new, acute  Fever of 100F or higher   For urgent or emergent issues, a gastroenterologist can be reached at any hour by calling [phone].   DIET:  We do recommend a small meal at first, but then you may proceed to your regular diet.  Drink plenty of fluids but you should avoid alcoholic beverages for 24 hours.  ACTIVITY:  You should plan to take it easy for the rest of today and you should NOT DRIVE or use heavy machinery until tomorrow (because of the sedation  medicines used during the test).    FOLLOW UP: Our staff will call the number listed on your records the next business day following your procedure to check on you and address any questions or concerns that you may have regarding the information given to you following your procedure. If we do not reach you, we will leave a message.  However, if you are feeling well and you are not experiencing any problems, there is no need to return our call.  We will assume that you have returned to your regular daily activities without incident.  If any biopsies were taken you will be contacted by phone or by letter within the next 1-3 weeks.  Please call us at [phone] if you have not heard about the biopsies in 3 weeks.    SIGNATURES/CONFIDENTIALITY: You and/or your care partner have signed paperwork which will be entered into your electronic medical record.  These signatures attest to the fact that that the information above on your After Visit Summary has been reviewed and is understood.  Full responsibility of the confidentiality of this discharge information lies with you and/or your care-partner. 

## 2017-07-01 NOTE — Progress Notes (Signed)
Called to room to assist during endoscopic procedure.  Patient ID and intended procedure confirmed with present staff. Received instructions for my participation in the procedure from the performing physician.  

## 2017-07-01 NOTE — Progress Notes (Signed)
Report to PACU, RN, vss, BBS= Clear.  

## 2017-07-01 NOTE — Op Note (Signed)
[place] Patient Name: [name] Procedure Date: 07/01/2017 10:39 AM MRN: [id number] Endoscopist: Ladene Artist , MD Age: 67 Referring MD:  Date of Birth: [birth year] Gender: Female Account #: [id number] Procedure:                Colonoscopy Indications:              High risk colon cancer surveillance: Personal                            history of colon cancer Medicines:                Monitored Anesthesia Care Procedure:                Pre-Anesthesia Assessment:                           - Prior to the procedure, a History and Physical                            was performed, and patient medications and                            allergies were reviewed. The patient's tolerance of                            previous anesthesia was also reviewed. The risks                            and benefits of the procedure and the sedation                            options and risks were discussed with the patient.                            All questions were answered, and informed consent                            was obtained. Prior Anticoagulants: The patient has                            taken no previous anticoagulant or antiplatelet                            agents. ASA Grade Assessment: II - A patient with                            mild systemic disease. After reviewing the risks                            and benefits, the patient was deemed in                            satisfactory condition to undergo the procedure.  After obtaining informed consent, the colonoscope                            was passed under direct vision. Throughout the                            procedure, the patient's blood pressure, pulse, and                            oxygen saturations were monitored continuously. The                            Colonoscope was introduced through the anus and                            advanced to the the ileocolonic  anastomosis. The                            terminal ileum and the rectum were photographed.                            The quality of the bowel preparation was good. The                            colonoscopy was performed without difficulty. The                            patient tolerated the procedure well. Unable to                            retroflex in the rectum due to prior anastomosis,                            narrow rectal vault. Scope In: 10:51:27 AM Scope Out: 11:02:33 AM Scope Withdrawal Time: 0 hours 10 minutes 7 seconds  Total Procedure Duration: 0 hours 11 minutes 6 seconds  Findings:                 The perianal and digital rectal examinations were                            normal.                           A 7 mm polyp was found in the transverse colon. The                            polyp was sessile. The polyp was removed with a                            cold snare. Resection and retrieval were complete.                           A 4 mm polyp was found in the hepatic  flexure. The                            polyp was sessile. The polyp was removed with a                            cold biopsy forceps. Resection and retrieval were                            complete.                           There was evidence of a prior end-to-side                            ileo-colonic anastomosis in the transverse colon.                            This was patent and was characterized by healthy                            appearing mucosa. The anastomosis was not                            traversed. It was located at 80 cm and the end of                            the colon was located at 90-95 cm from the anal                            verge.                           There was evidence of a prior end-to-end                            colo-colonic anastomosis in the rectum. This was                            patent and was characterized by healthy appearing                             mucosa. The anastomosis was traversed.                           The exam was otherwise without abnormality on                            direct views. Complications:            No immediate complications. Estimated blood loss:                            None. Estimated Blood Loss:     Estimated blood loss: none. Impression:               -  One 7 mm polyp in the transverse colon, removed                            with a cold snare. Resected and retrieved.                           - One 4 mm polyp at the hepatic flexure, removed                            with a cold biopsy forceps. Resected and retrieved.                           - Patent end-to-side ileo-colonic anastomosis,                            characterized by healthy appearing mucosa.                           - Patent end-to-end colo-colonic anastomosis,                            characterized by healthy appearing mucosa.                           - The examination was otherwise normal on direct                            views. Recommendation:           - Repeat colonoscopy in 2 years for surveillance.                           - Patient has a contact number available for                            emergencies. The signs and symptoms of potential                            delayed complications were discussed with the                            patient. Return to normal activities tomorrow.                            Written discharge instructions were provided to the                            patient.                           - Resume previous diet.                           - Continue present medications.                           -  Await pathology results. Ladene Artist, MD 07/01/2017 11:09:38 AM This report has been signed electronically.

## 2017-07-01 NOTE — Progress Notes (Signed)
Pt's states no medical or surgical changes since previsit or office visit. 

## 2017-07-02 ENCOUNTER — Telehealth: Payer: Self-pay | Admitting: *Deleted

## 2017-07-02 ENCOUNTER — Telehealth: Payer: Self-pay

## 2017-07-02 NOTE — Telephone Encounter (Signed)
Left message

## 2017-07-02 NOTE — Telephone Encounter (Signed)
Did not leave a message due to voice mail reached on number documented for call back not the pt's ID. ID was a female with different name

## 2017-07-05 ENCOUNTER — Other Ambulatory Visit: Payer: Self-pay | Admitting: Hematology & Oncology

## 2017-07-08 ENCOUNTER — Other Ambulatory Visit: Payer: Self-pay | Admitting: Family Medicine

## 2017-07-09 ENCOUNTER — Encounter: Payer: Self-pay | Admitting: Gastroenterology

## 2017-07-17 ENCOUNTER — Other Ambulatory Visit: Payer: Self-pay | Admitting: Family Medicine

## 2017-07-17 ENCOUNTER — Other Ambulatory Visit: Payer: Self-pay | Admitting: Hematology & Oncology

## 2017-07-17 DIAGNOSIS — R399 Unspecified symptoms and signs involving the genitourinary system: Secondary | ICD-10-CM | POA: Diagnosis not present

## 2017-07-17 DIAGNOSIS — K8689 Other specified diseases of pancreas: Secondary | ICD-10-CM

## 2017-07-17 DIAGNOSIS — C20 Malignant neoplasm of rectum: Secondary | ICD-10-CM

## 2017-07-31 DIAGNOSIS — H903 Sensorineural hearing loss, bilateral: Secondary | ICD-10-CM | POA: Diagnosis not present

## 2017-07-31 DIAGNOSIS — H905 Unspecified sensorineural hearing loss: Secondary | ICD-10-CM | POA: Diagnosis not present

## 2017-08-14 DIAGNOSIS — R220 Localized swelling, mass and lump, head: Secondary | ICD-10-CM | POA: Diagnosis not present

## 2017-08-14 DIAGNOSIS — H903 Sensorineural hearing loss, bilateral: Secondary | ICD-10-CM | POA: Diagnosis not present

## 2017-08-14 DIAGNOSIS — G935 Compression of brain: Secondary | ICD-10-CM | POA: Diagnosis not present

## 2017-08-14 DIAGNOSIS — D329 Benign neoplasm of meninges, unspecified: Secondary | ICD-10-CM | POA: Diagnosis not present

## 2017-08-16 ENCOUNTER — Other Ambulatory Visit: Payer: Self-pay | Admitting: Family Medicine

## 2017-08-25 ENCOUNTER — Other Ambulatory Visit: Payer: Self-pay | Admitting: Family Medicine

## 2017-08-28 ENCOUNTER — Encounter: Payer: Self-pay | Admitting: Family Medicine

## 2017-08-28 DIAGNOSIS — E113413 Type 2 diabetes mellitus with severe nonproliferative diabetic retinopathy with macular edema, bilateral: Secondary | ICD-10-CM | POA: Diagnosis not present

## 2017-08-28 DIAGNOSIS — D3131 Benign neoplasm of right choroid: Secondary | ICD-10-CM | POA: Diagnosis not present

## 2017-08-28 DIAGNOSIS — E113511 Type 2 diabetes mellitus with proliferative diabetic retinopathy with macular edema, right eye: Secondary | ICD-10-CM | POA: Diagnosis not present

## 2017-08-28 DIAGNOSIS — H3582 Retinal ischemia: Secondary | ICD-10-CM | POA: Diagnosis not present

## 2017-08-28 DIAGNOSIS — E113592 Type 2 diabetes mellitus with proliferative diabetic retinopathy without macular edema, left eye: Secondary | ICD-10-CM | POA: Diagnosis not present

## 2017-08-28 DIAGNOSIS — H43813 Vitreous degeneration, bilateral: Secondary | ICD-10-CM | POA: Diagnosis not present

## 2017-08-28 LAB — HM DIABETES EYE EXAM

## 2017-09-01 DIAGNOSIS — D333 Benign neoplasm of cranial nerves: Secondary | ICD-10-CM | POA: Diagnosis not present

## 2017-09-01 DIAGNOSIS — H00014 Hordeolum externum left upper eyelid: Secondary | ICD-10-CM | POA: Diagnosis not present

## 2017-09-03 DIAGNOSIS — H905 Unspecified sensorineural hearing loss: Secondary | ICD-10-CM | POA: Diagnosis not present

## 2017-09-03 DIAGNOSIS — D329 Benign neoplasm of meninges, unspecified: Secondary | ICD-10-CM | POA: Diagnosis not present

## 2017-09-08 DIAGNOSIS — E113413 Type 2 diabetes mellitus with severe nonproliferative diabetic retinopathy with macular edema, bilateral: Secondary | ICD-10-CM | POA: Diagnosis not present

## 2017-09-08 DIAGNOSIS — E113411 Type 2 diabetes mellitus with severe nonproliferative diabetic retinopathy with macular edema, right eye: Secondary | ICD-10-CM | POA: Diagnosis not present

## 2017-09-15 ENCOUNTER — Other Ambulatory Visit: Payer: Self-pay | Admitting: Family Medicine

## 2017-09-20 ENCOUNTER — Other Ambulatory Visit: Payer: Self-pay | Admitting: Family Medicine

## 2017-09-29 ENCOUNTER — Other Ambulatory Visit: Payer: Self-pay | Admitting: Family Medicine

## 2017-09-29 NOTE — Telephone Encounter (Signed)
Ambien refill Last Refill:05/21/16 #15 Last OV: 05/29/17 PCP: Dr. Nani Ravens [facility]acy: [facility]

## 2017-09-29 NOTE — Telephone Encounter (Signed)
Copied from [place] [id number]. Topic: Quick Communication - Rx Refill/Question >> Sep 29, 2017  4:55 PM Oliver Pila B wrote: Medication: zolpidem (AMBIEN) 10 MG tablet [[id number]]   Has the patient contacted their pharmacy? Yes.   (Agent: If no, request that the patient contact the pharmacy for the refill.) (Agent: If yes, when and what did the pharmacy advise?)  Preferred Pharmacy (with phone number or street name): [facility]  Agent: Please be advised that RX refills may take up to 3 business days. We ask that you follow-up with your pharmacy.

## 2017-10-01 DIAGNOSIS — I10 Essential (primary) hypertension: Secondary | ICD-10-CM | POA: Diagnosis not present

## 2017-10-01 DIAGNOSIS — R220 Localized swelling, mass and lump, head: Secondary | ICD-10-CM | POA: Diagnosis not present

## 2017-10-01 DIAGNOSIS — D496 Neoplasm of unspecified behavior of brain: Secondary | ICD-10-CM | POA: Insufficient documentation

## 2017-10-01 DIAGNOSIS — I2510 Atherosclerotic heart disease of native coronary artery without angina pectoris: Secondary | ICD-10-CM | POA: Diagnosis not present

## 2017-10-01 DIAGNOSIS — R011 Cardiac murmur, unspecified: Secondary | ICD-10-CM | POA: Diagnosis not present

## 2017-10-01 DIAGNOSIS — Z79890 Hormone replacement therapy (postmenopausal): Secondary | ICD-10-CM | POA: Diagnosis not present

## 2017-10-01 DIAGNOSIS — H905 Unspecified sensorineural hearing loss: Secondary | ICD-10-CM | POA: Diagnosis not present

## 2017-10-01 DIAGNOSIS — D333 Benign neoplasm of cranial nerves: Secondary | ICD-10-CM | POA: Diagnosis not present

## 2017-10-01 DIAGNOSIS — E11319 Type 2 diabetes mellitus with unspecified diabetic retinopathy without macular edema: Secondary | ICD-10-CM | POA: Diagnosis not present

## 2017-10-01 DIAGNOSIS — D332 Benign neoplasm of brain, unspecified: Secondary | ICD-10-CM | POA: Diagnosis not present

## 2017-10-01 DIAGNOSIS — Z951 Presence of aortocoronary bypass graft: Secondary | ICD-10-CM | POA: Diagnosis not present

## 2017-10-01 DIAGNOSIS — G936 Cerebral edema: Secondary | ICD-10-CM | POA: Diagnosis not present

## 2017-10-01 DIAGNOSIS — Z794 Long term (current) use of insulin: Secondary | ICD-10-CM | POA: Diagnosis not present

## 2017-10-01 DIAGNOSIS — Z955 Presence of coronary angioplasty implant and graft: Secondary | ICD-10-CM | POA: Diagnosis not present

## 2017-10-01 DIAGNOSIS — D329 Benign neoplasm of meninges, unspecified: Secondary | ICD-10-CM | POA: Diagnosis not present

## 2017-10-01 MED ORDER — ZOLPIDEM TARTRATE 10 MG PO TABS: 10.0000 mg | ORAL_TABLET | Freq: Every evening | ORAL | 0 refills | Status: DC | PRN

## 2017-10-04 ENCOUNTER — Other Ambulatory Visit: Payer: Self-pay | Admitting: Family Medicine

## 2017-10-05 DIAGNOSIS — I119 Hypertensive heart disease without heart failure: Secondary | ICD-10-CM | POA: Diagnosis not present

## 2017-10-05 DIAGNOSIS — Z86011 Personal history of benign neoplasm of the brain: Secondary | ICD-10-CM | POA: Diagnosis not present

## 2017-10-05 DIAGNOSIS — Z72820 Sleep deprivation: Secondary | ICD-10-CM | POA: Diagnosis not present

## 2017-10-05 DIAGNOSIS — I10 Essential (primary) hypertension: Secondary | ICD-10-CM | POA: Diagnosis not present

## 2017-10-05 DIAGNOSIS — Z794 Long term (current) use of insulin: Secondary | ICD-10-CM | POA: Diagnosis not present

## 2017-10-05 DIAGNOSIS — F308 Other manic episodes: Secondary | ICD-10-CM | POA: Diagnosis not present

## 2017-10-05 DIAGNOSIS — I2510 Atherosclerotic heart disease of native coronary artery without angina pectoris: Secondary | ICD-10-CM | POA: Diagnosis not present

## 2017-10-05 DIAGNOSIS — E11319 Type 2 diabetes mellitus with unspecified diabetic retinopathy without macular edema: Secondary | ICD-10-CM | POA: Diagnosis not present

## 2017-10-05 DIAGNOSIS — R509 Fever, unspecified: Secondary | ICD-10-CM | POA: Diagnosis not present

## 2017-10-05 DIAGNOSIS — Z951 Presence of aortocoronary bypass graft: Secondary | ICD-10-CM | POA: Diagnosis not present

## 2017-10-05 DIAGNOSIS — E119 Type 2 diabetes mellitus without complications: Secondary | ICD-10-CM | POA: Diagnosis not present

## 2017-10-05 DIAGNOSIS — Z85038 Personal history of other malignant neoplasm of large intestine: Secondary | ICD-10-CM | POA: Diagnosis not present

## 2017-10-05 DIAGNOSIS — Z955 Presence of coronary angioplasty implant and graft: Secondary | ICD-10-CM | POA: Diagnosis not present

## 2017-10-05 DIAGNOSIS — W19XXXA Unspecified fall, initial encounter: Secondary | ICD-10-CM | POA: Diagnosis not present

## 2017-10-06 DIAGNOSIS — I10 Essential (primary) hypertension: Secondary | ICD-10-CM | POA: Diagnosis not present

## 2017-10-06 DIAGNOSIS — I2510 Atherosclerotic heart disease of native coronary artery without angina pectoris: Secondary | ICD-10-CM | POA: Diagnosis not present

## 2017-10-06 DIAGNOSIS — F329 Major depressive disorder, single episode, unspecified: Secondary | ICD-10-CM | POA: Diagnosis not present

## 2017-10-06 DIAGNOSIS — F419 Anxiety disorder, unspecified: Secondary | ICD-10-CM | POA: Diagnosis not present

## 2017-10-06 DIAGNOSIS — Z794 Long term (current) use of insulin: Secondary | ICD-10-CM | POA: Diagnosis not present

## 2017-10-06 DIAGNOSIS — D496 Neoplasm of unspecified behavior of brain: Secondary | ICD-10-CM | POA: Diagnosis not present

## 2017-10-06 DIAGNOSIS — Z7982 Long term (current) use of aspirin: Secondary | ICD-10-CM | POA: Diagnosis not present

## 2017-10-06 DIAGNOSIS — Z483 Aftercare following surgery for neoplasm: Secondary | ICD-10-CM | POA: Diagnosis not present

## 2017-10-06 DIAGNOSIS — E11319 Type 2 diabetes mellitus with unspecified diabetic retinopathy without macular edema: Secondary | ICD-10-CM | POA: Diagnosis not present

## 2017-10-09 DIAGNOSIS — E11319 Type 2 diabetes mellitus with unspecified diabetic retinopathy without macular edema: Secondary | ICD-10-CM | POA: Diagnosis not present

## 2017-10-09 DIAGNOSIS — I2510 Atherosclerotic heart disease of native coronary artery without angina pectoris: Secondary | ICD-10-CM | POA: Diagnosis not present

## 2017-10-09 DIAGNOSIS — R011 Cardiac murmur, unspecified: Secondary | ICD-10-CM | POA: Diagnosis not present

## 2017-10-09 DIAGNOSIS — E1065 Type 1 diabetes mellitus with hyperglycemia: Secondary | ICD-10-CM | POA: Diagnosis not present

## 2017-10-09 DIAGNOSIS — N390 Urinary tract infection, site not specified: Secondary | ICD-10-CM | POA: Diagnosis not present

## 2017-10-09 DIAGNOSIS — Z794 Long term (current) use of insulin: Secondary | ICD-10-CM | POA: Insufficient documentation

## 2017-10-09 DIAGNOSIS — R319 Hematuria, unspecified: Secondary | ICD-10-CM | POA: Diagnosis not present

## 2017-10-09 DIAGNOSIS — D496 Neoplasm of unspecified behavior of brain: Secondary | ICD-10-CM | POA: Diagnosis not present

## 2017-10-09 DIAGNOSIS — N3000 Acute cystitis without hematuria: Secondary | ICD-10-CM | POA: Insufficient documentation

## 2017-10-09 DIAGNOSIS — G9340 Encephalopathy, unspecified: Secondary | ICD-10-CM | POA: Diagnosis not present

## 2017-10-09 DIAGNOSIS — N3090 Cystitis, unspecified without hematuria: Secondary | ICD-10-CM | POA: Diagnosis not present

## 2017-10-09 DIAGNOSIS — I10 Essential (primary) hypertension: Secondary | ICD-10-CM | POA: Diagnosis not present

## 2017-10-09 DIAGNOSIS — R0989 Other specified symptoms and signs involving the circulatory and respiratory systems: Secondary | ICD-10-CM | POA: Diagnosis not present

## 2017-10-09 DIAGNOSIS — B9620 Unspecified Escherichia coli [E. coli] as the cause of diseases classified elsewhere: Secondary | ICD-10-CM | POA: Diagnosis not present

## 2017-10-09 DIAGNOSIS — Z85038 Personal history of other malignant neoplasm of large intestine: Secondary | ICD-10-CM | POA: Diagnosis not present

## 2017-10-09 DIAGNOSIS — F418 Other specified anxiety disorders: Secondary | ICD-10-CM | POA: Diagnosis not present

## 2017-10-09 DIAGNOSIS — R4182 Altered mental status, unspecified: Secondary | ICD-10-CM | POA: Diagnosis not present

## 2017-10-09 DIAGNOSIS — Z951 Presence of aortocoronary bypass graft: Secondary | ICD-10-CM | POA: Diagnosis not present

## 2017-10-09 DIAGNOSIS — I25810 Atherosclerosis of coronary artery bypass graft(s) without angina pectoris: Secondary | ICD-10-CM | POA: Insufficient documentation

## 2017-10-09 DIAGNOSIS — R410 Disorientation, unspecified: Secondary | ICD-10-CM | POA: Diagnosis not present

## 2017-10-09 DIAGNOSIS — E118 Type 2 diabetes mellitus with unspecified complications: Secondary | ICD-10-CM | POA: Insufficient documentation

## 2017-10-09 DIAGNOSIS — R220 Localized swelling, mass and lump, head: Secondary | ICD-10-CM | POA: Diagnosis not present

## 2017-10-09 DIAGNOSIS — R399 Unspecified symptoms and signs involving the genitourinary system: Secondary | ICD-10-CM | POA: Diagnosis not present

## 2017-10-09 DIAGNOSIS — D333 Benign neoplasm of cranial nerves: Secondary | ICD-10-CM | POA: Diagnosis not present

## 2017-10-10 DIAGNOSIS — E1065 Type 1 diabetes mellitus with hyperglycemia: Secondary | ICD-10-CM | POA: Diagnosis not present

## 2017-10-10 DIAGNOSIS — N3000 Acute cystitis without hematuria: Secondary | ICD-10-CM | POA: Diagnosis not present

## 2017-10-10 DIAGNOSIS — I25810 Atherosclerosis of coronary artery bypass graft(s) without angina pectoris: Secondary | ICD-10-CM | POA: Diagnosis not present

## 2017-10-10 DIAGNOSIS — G9340 Encephalopathy, unspecified: Secondary | ICD-10-CM | POA: Diagnosis not present

## 2017-10-11 DIAGNOSIS — N3000 Acute cystitis without hematuria: Secondary | ICD-10-CM | POA: Diagnosis not present

## 2017-10-11 DIAGNOSIS — I25810 Atherosclerosis of coronary artery bypass graft(s) without angina pectoris: Secondary | ICD-10-CM | POA: Diagnosis not present

## 2017-10-11 DIAGNOSIS — G9340 Encephalopathy, unspecified: Secondary | ICD-10-CM | POA: Diagnosis not present

## 2017-10-11 DIAGNOSIS — E1065 Type 1 diabetes mellitus with hyperglycemia: Secondary | ICD-10-CM | POA: Diagnosis not present

## 2017-10-12 ENCOUNTER — Other Ambulatory Visit: Payer: Self-pay | Admitting: Hematology & Oncology

## 2017-10-13 ENCOUNTER — Telehealth: Payer: Self-pay

## 2017-10-13 ENCOUNTER — Telehealth: Payer: Self-pay | Admitting: Family Medicine

## 2017-10-13 NOTE — Telephone Encounter (Signed)
Called patient to try and schedule appointment for her , patient refused. States she will call back when she can come.

## 2017-10-13 NOTE — Telephone Encounter (Signed)
Patient came in to the office stating that she had an appt with Dr. Nani Ravens at 9:00. She stated that she spoke to a nurse last night (10/12/17) and made the appt. After accessing the patient's chart, I found that an appt was never made. I offered to make her an appt with another office or to set an appt with Dr. Nani Ravens at a later date, but the patient declined. Patient stated that she just had brain surgery and has problems getting help with transportation. Patient left after I advised to let us know as soon a possible if we could schedule her for Wednesday with Dr. Nani Ravens to be seen.

## 2017-10-15 ENCOUNTER — Other Ambulatory Visit: Payer: Self-pay | Admitting: Family Medicine

## 2017-10-15 ENCOUNTER — Other Ambulatory Visit: Payer: Self-pay | Admitting: Hematology & Oncology

## 2017-10-15 DIAGNOSIS — K8689 Other specified diseases of pancreas: Secondary | ICD-10-CM

## 2017-10-15 DIAGNOSIS — C20 Malignant neoplasm of rectum: Secondary | ICD-10-CM

## 2017-10-16 DIAGNOSIS — Z7982 Long term (current) use of aspirin: Secondary | ICD-10-CM | POA: Diagnosis not present

## 2017-10-16 DIAGNOSIS — E11319 Type 2 diabetes mellitus with unspecified diabetic retinopathy without macular edema: Secondary | ICD-10-CM | POA: Diagnosis not present

## 2017-10-16 DIAGNOSIS — I10 Essential (primary) hypertension: Secondary | ICD-10-CM | POA: Diagnosis not present

## 2017-10-16 DIAGNOSIS — Z794 Long term (current) use of insulin: Secondary | ICD-10-CM | POA: Diagnosis not present

## 2017-10-16 DIAGNOSIS — I2510 Atherosclerotic heart disease of native coronary artery without angina pectoris: Secondary | ICD-10-CM | POA: Diagnosis not present

## 2017-10-16 DIAGNOSIS — D496 Neoplasm of unspecified behavior of brain: Secondary | ICD-10-CM | POA: Diagnosis not present

## 2017-10-16 DIAGNOSIS — F419 Anxiety disorder, unspecified: Secondary | ICD-10-CM | POA: Diagnosis not present

## 2017-10-16 DIAGNOSIS — Z483 Aftercare following surgery for neoplasm: Secondary | ICD-10-CM | POA: Diagnosis not present

## 2017-10-16 DIAGNOSIS — F329 Major depressive disorder, single episode, unspecified: Secondary | ICD-10-CM | POA: Diagnosis not present

## 2017-10-20 ENCOUNTER — Ambulatory Visit: Payer: Self-pay | Admitting: Family Medicine

## 2017-10-20 NOTE — Telephone Encounter (Signed)
I'd prefer a face to face, we can reorder a short supply if she would run out before seeing her. TY.

## 2017-10-20 NOTE — Telephone Encounter (Signed)
She can either fil the hard copy or wait until Wednesday for our discussion.

## 2017-10-20 NOTE — Telephone Encounter (Signed)
Spoke to the patient and she does have an appt this Wednesday and needs enough to last until then.  She is completely out and has never filled before.

## 2017-10-20 NOTE — Telephone Encounter (Signed)
Informed the patient of PCP instructions. She verbalized understanding.

## 2017-10-20 NOTE — Telephone Encounter (Signed)
  Out going call to patient; patient inquiring  if she may take Seroquel. Received  RX. At ER.   Want RX. Called in.  And wishes to be notified. When called in.   Reason for Disposition . RN needs further essential information from caller in order to complete triage  Answer Assessment - Initial Assessment Questions 1. REASON FOR CALL or QUESTION: "What is your reason for calling today?" or "How can I best help you?" or "What question do you have that I can help answer?"     Rx for Seroquoel ok ?  Protocols used: INFORMATION ONLY CALL-A-AH

## 2017-10-20 NOTE — Telephone Encounter (Signed)
The patient went to the ER at [place] and was prescribed this there... BUT she has never taken it before. She has it as a written hardcopy to be filled.   She stated they took her off a lot of her meds in the hospital in [place]  Currently she has leftover percocets (they took her off of) and using them for sleep. She has never taken seroquel.  She does have an appt on Wednesday and states she has a lot to discuss/and go over.

## 2017-10-20 NOTE — Telephone Encounter (Signed)
Please find out sig, dosage and fill a 20 d supply with no refills. TY.

## 2017-10-22 ENCOUNTER — Inpatient Hospital Stay: Payer: Medicare HMO | Admitting: Family Medicine

## 2017-10-27 ENCOUNTER — Ambulatory Visit (INDEPENDENT_AMBULATORY_CARE_PROVIDER_SITE_OTHER): Payer: Medicare HMO | Admitting: Family Medicine

## 2017-10-27 ENCOUNTER — Other Ambulatory Visit: Payer: Self-pay

## 2017-10-27 ENCOUNTER — Encounter: Payer: Self-pay | Admitting: Family Medicine

## 2017-10-27 VITALS — BP 122/60 | HR 95 | Temp 98.1°F | Resp 18 | Ht 60.0 in | Wt 152.0 lb

## 2017-10-27 DIAGNOSIS — G4700 Insomnia, unspecified: Secondary | ICD-10-CM | POA: Diagnosis not present

## 2017-10-27 DIAGNOSIS — R350 Frequency of micturition: Secondary | ICD-10-CM

## 2017-10-27 NOTE — Progress Notes (Signed)
Chief Complaint  Patient presents with  . Hospitalization Follow-up    R brain tumor removal 8/28, 2 hospitalizations since    Subjective: Patient is a 67 y.o. female here for hosp and ED f/u.  Brain tumor removed on 10/01/17. She has been to ED twice since that time. Her medicine has been changed around so that her Ambien and hydrocodone. She is having quite a bit of trouble sleeping. She has tried Melatonin with mixed results. She is either staying up long hours or waking up freq. She was rx'd Seroquel in hospital, but has not taken yet due to confusion and her thinking I had to approve it. She was also dx'd with a UTI and finished a 7 d course of Keflex. Still having issues and wondering if she still has infection. She does   ROS: Heart: Denies chest pain  Lungs: Denies SOB   Past Medical History:  Diagnosis Date  .  Paroxysmal SVT (ANVRT)    S/p AV nodal ablation Dr. Lovena Le 09/28/08   . Anxiety   . CAD    Coronary disease status post non-ST elevation MI in May 2010 with  PCI of the left circumflex on Jun 24, 2008. She then had a PCI of  the RCA on July 15, 2008. 02/24/12 Cath, Severe 95% LAD, ostial 95% circ, ostial RCA, patent stents in distal RCA and mid circ normal LV 02/26/12 CABG with LIMA to LAD, SVG to RCA, and SVG to OM Dr. Roxan Hockey    . Cancer [facility])    adenocarcinoma of rectum  . Cataract   . Depression   . Diabetes mellitus, insulin dependent (IDDM), uncontrolled ([place])   . Heart murmur   . Hyperlipidemia   . Hypertension   . Hypertensive heart disease   . S/P radiation therapy 04/11/14-05/18/14   50.4Gy rectal  . Vertigo    Result of chemotherapy; has been to ENT and told nothing to be done    Objective: BP 122/60 (BP Location: Left Arm, Patient Position: Sitting, Cuff Size: Normal)   Pulse 95   Temp 98.1 F (36.7 C) (Oral)   Resp 18   Ht 5' (1.524 m)   Wt 152 lb (68.9 kg)   SpO2 97%   BMI 29.69 kg/m  General: Awake, appears stated age HEENT: MMM,  EOMi Heart: RRR, no murmurs Lungs: CTAB, no rales, wheezes or rhonchi. No accessory muscle use Abd: BS+, soft, nt, nd Psych: Nml affect and mood, difficulty putting together complete sentences/thoughts  Assessment and Plan: Urinary frequency - Plan: Urinalysis  Insomnia, unspecified type  UA neg. Pt has appt with urology in 6 d. No changes for now. See what Seroquel does for next month or so. May need time to get away from Ambien and hydrocodone. Could consider Belsomra or trazodone if not helpful/side effects. Agree with stopping Ambien. F/u in 1 mo.  The patient voiced understanding and agreement to the plan.  Greater than 25 minutes, around 32 minutes, were spent face to face with the patient with greater than 50% of this time spent counseling on sleep issues, medication management, and urinary symptoms.    Oliver, DO 10/28/17  2:33 PM

## 2017-10-27 NOTE — Patient Instructions (Addendum)
Start taking the Seroquel.   We will be in touch regarding the results of your urine.  Get in contact with your neurosurgery team regarding your eye.  Let us know if you need anything.

## 2017-10-28 LAB — URINALYSIS
Bilirubin Urine: NEGATIVE
KETONES UR: NEGATIVE
LEUKOCYTES UA: NEGATIVE
Nitrite: NEGATIVE
SPECIFIC GRAVITY, URINE: 1.015 (ref 1.000–1.030)
Total Protein, Urine: NEGATIVE
UROBILINOGEN UA: 0.2 (ref 0.0–1.0)
Urine Glucose: NEGATIVE
pH: 5.5 (ref 5.0–8.0)

## 2017-10-29 ENCOUNTER — Telehealth: Payer: Self-pay

## 2017-10-29 DIAGNOSIS — Z9189 Other specified personal risk factors, not elsewhere classified: Secondary | ICD-10-CM

## 2017-10-29 NOTE — Telephone Encounter (Signed)
Author phoned pt. Re:UA results. Pt. Reassured that results were normal. Pt. Expressed concern over her anxiety and mood swings, and wanted to see if Dr. Nani Ravens could order something for that. "lorazepam or something?", pt. stated. Routed to Dr. Nani Ravens to review and possibly place referral for home care nursing, as pt. stated she has difficulty managing her medications.   Copied from [place] [id number]. Topic: Inquiry >> Oct 29, 2017 11:53 AM [name] wrote: Reason for CRM: 1. pt states she weighed 144 lbs at her appt on 9/23.  Her AVS stated 152 lbs.  2. Pt states she cannot deal with mychart right now and wants a call back about her UA results. 3.pt has questions avout a Rx she wants to know if dr will right. (pt declined to go into detail) Would like a call back. thanks

## 2017-10-29 NOTE — Telephone Encounter (Signed)
No, the seroquel should be helping with this. With her difficulty with mentation, lorazepam would not be a good choice. OK to place referral. TY.

## 2017-10-30 NOTE — Addendum Note (Signed)
Addended by: Raynelle Dick R on: 10/30/2017 09:50 AM   Modules accepted: Orders

## 2017-10-30 NOTE — Telephone Encounter (Signed)
Author phoned pt. To let her know that we cannot prescribe lorazepam d/t hx of confusion, but will place home health referral. No answer, author left detailed VM, asking permission to notify her son as well since she reportedly lives with him. Referral left pended for Dr. Irene Limbo review.

## 2017-11-03 DIAGNOSIS — E113411 Type 2 diabetes mellitus with severe nonproliferative diabetic retinopathy with macular edema, right eye: Secondary | ICD-10-CM | POA: Diagnosis not present

## 2017-11-03 DIAGNOSIS — H3582 Retinal ischemia: Secondary | ICD-10-CM | POA: Diagnosis not present

## 2017-11-03 DIAGNOSIS — E113492 Type 2 diabetes mellitus with severe nonproliferative diabetic retinopathy without macular edema, left eye: Secondary | ICD-10-CM | POA: Diagnosis not present

## 2017-11-03 DIAGNOSIS — D3131 Benign neoplasm of right choroid: Secondary | ICD-10-CM | POA: Diagnosis not present

## 2017-11-11 NOTE — Telephone Encounter (Signed)
Home health referral order from 9/26 re-routed to Dr. Nani Ravens to review and approve.

## 2017-11-11 NOTE — Telephone Encounter (Signed)
Completed. TY.

## 2017-11-11 NOTE — Addendum Note (Signed)
Addended by: Ames Coupe on: 11/11/2017 12:03 PM   Modules accepted: Orders

## 2017-11-13 ENCOUNTER — Telehealth: Payer: Self-pay | Admitting: Family Medicine

## 2017-11-13 NOTE — Telephone Encounter (Signed)
Noted  

## 2017-11-13 NOTE — Telephone Encounter (Signed)
Copied from [place] [phone]. Topic: General - Other >> Nov 13, 2017 12:26 PM Valla Leaver wrote: Reason for CRM: Hoyle Sauer with [facility] calling to notify patient is declining home health. Please call back if needed.

## 2017-11-14 ENCOUNTER — Other Ambulatory Visit: Payer: Self-pay | Admitting: Hematology & Oncology

## 2017-11-14 ENCOUNTER — Other Ambulatory Visit: Payer: Self-pay | Admitting: Family Medicine

## 2017-11-14 DIAGNOSIS — K8689 Other specified diseases of pancreas: Secondary | ICD-10-CM

## 2017-11-14 DIAGNOSIS — C20 Malignant neoplasm of rectum: Secondary | ICD-10-CM

## 2017-11-18 ENCOUNTER — Other Ambulatory Visit: Payer: Medicare HMO

## 2017-11-18 ENCOUNTER — Ambulatory Visit: Payer: Medicare HMO | Admitting: Family

## 2017-11-26 DIAGNOSIS — H903 Sensorineural hearing loss, bilateral: Secondary | ICD-10-CM | POA: Diagnosis not present

## 2017-11-27 ENCOUNTER — Encounter: Payer: Self-pay | Admitting: Family Medicine

## 2017-11-27 ENCOUNTER — Encounter: Payer: Self-pay | Admitting: Family

## 2017-11-27 ENCOUNTER — Ambulatory Visit (INDEPENDENT_AMBULATORY_CARE_PROVIDER_SITE_OTHER): Payer: Medicare HMO | Admitting: Family Medicine

## 2017-11-27 ENCOUNTER — Inpatient Hospital Stay: Payer: Medicare HMO | Attending: Family

## 2017-11-27 ENCOUNTER — Other Ambulatory Visit: Payer: Self-pay | Admitting: Family Medicine

## 2017-11-27 ENCOUNTER — Other Ambulatory Visit: Payer: Self-pay

## 2017-11-27 ENCOUNTER — Inpatient Hospital Stay (HOSPITAL_BASED_OUTPATIENT_CLINIC_OR_DEPARTMENT_OTHER): Payer: Medicare HMO | Admitting: Family

## 2017-11-27 ENCOUNTER — Other Ambulatory Visit: Payer: Medicare HMO

## 2017-11-27 VITALS — BP 114/62 | HR 76 | Temp 98.4°F | Ht 60.0 in | Wt 148.1 lb

## 2017-11-27 VITALS — BP 127/64 | HR 79 | Temp 98.1°F | Resp 18 | Wt 148.1 lb

## 2017-11-27 DIAGNOSIS — Z923 Personal history of irradiation: Secondary | ICD-10-CM

## 2017-11-27 DIAGNOSIS — G4700 Insomnia, unspecified: Secondary | ICD-10-CM | POA: Diagnosis not present

## 2017-11-27 DIAGNOSIS — D508 Other iron deficiency anemias: Secondary | ICD-10-CM

## 2017-11-27 DIAGNOSIS — Z85048 Personal history of other malignant neoplasm of rectum, rectosigmoid junction, and anus: Secondary | ICD-10-CM | POA: Diagnosis not present

## 2017-11-27 DIAGNOSIS — Z23 Encounter for immunization: Secondary | ICD-10-CM

## 2017-11-27 DIAGNOSIS — Z9221 Personal history of antineoplastic chemotherapy: Secondary | ICD-10-CM | POA: Diagnosis not present

## 2017-11-27 DIAGNOSIS — C20 Malignant neoplasm of rectum: Secondary | ICD-10-CM

## 2017-11-27 DIAGNOSIS — E559 Vitamin D deficiency, unspecified: Secondary | ICD-10-CM

## 2017-11-27 DIAGNOSIS — Z7982 Long term (current) use of aspirin: Secondary | ICD-10-CM | POA: Diagnosis not present

## 2017-11-27 LAB — CBC WITH DIFFERENTIAL (CANCER CENTER ONLY)
ABS IMMATURE GRANULOCYTES: 0.02 10*3/uL (ref 0.00–0.07)
BASOS ABS: 0 10*3/uL (ref 0.0–0.1)
BASOS PCT: 0 %
EOS ABS: 0.1 10*3/uL (ref 0.0–0.5)
Eosinophils Relative: 2 %
HCT: 39.5 % (ref 36.0–46.0)
Hemoglobin: 13.1 g/dL (ref 12.0–15.0)
IMMATURE GRANULOCYTES: 0 %
Lymphocytes Relative: 17 %
Lymphs Abs: 0.9 10*3/uL (ref 0.7–4.0)
MCH: 30.9 pg (ref 26.0–34.0)
MCHC: 33.2 g/dL (ref 30.0–36.0)
MCV: 93.2 fL (ref 80.0–100.0)
MONOS PCT: 9 %
Monocytes Absolute: 0.5 10*3/uL (ref 0.1–1.0)
NEUTROS ABS: 3.6 10*3/uL (ref 1.7–7.7)
NEUTROS PCT: 72 %
NRBC: 0 % (ref 0.0–0.2)
PLATELETS: 148 10*3/uL — AB (ref 150–400)
RBC: 4.24 MIL/uL (ref 3.87–5.11)
RDW: 12.6 % (ref 11.5–15.5)
WBC Count: 5.1 10*3/uL (ref 4.0–10.5)

## 2017-11-27 LAB — CMP (CANCER CENTER ONLY)
ALT: 26 U/L (ref 10–47)
ANION GAP: 8 (ref 5–15)
AST: 23 U/L (ref 11–38)
Albumin: 3.7 g/dL (ref 3.5–5.0)
Alkaline Phosphatase: 47 U/L (ref 26–84)
BUN: 13 mg/dL (ref 7–22)
CALCIUM: 9.6 mg/dL (ref 8.0–10.3)
CO2: 29 mmol/L (ref 18–33)
Chloride: 104 mmol/L (ref 98–108)
Creatinine: 0.5 mg/dL — ABNORMAL LOW (ref 0.60–1.20)
GLUCOSE: 169 mg/dL — AB (ref 73–118)
Potassium: 4.2 mmol/L (ref 3.3–4.7)
SODIUM: 141 mmol/L (ref 128–145)
TOTAL PROTEIN: 6.9 g/dL (ref 6.4–8.1)
Total Bilirubin: 0.6 mg/dL (ref 0.2–1.6)

## 2017-11-27 MED ORDER — ARIPIPRAZOLE 2 MG PO TABS: 2.0000 mg | ORAL_TABLET | Freq: Every day | ORAL | 1 refills | Status: DC

## 2017-11-27 NOTE — Patient Instructions (Addendum)
Please consider counseling. Contact [phone] to schedule an appointment or inquire about cost/insurance coverage.  Sleep Hygiene Tips:  Do not watch TV or look at screens within 1 hour of going to bed. If you do, make sure there is a blue light filter (nighttime mode) involved.  Try to go to bed around the same time every night. Wake up at the same time within 1 hour of regular time. Ex: If you wake up at 7 AM for work, do not sleep past 8 AM on days that you don't work.  Do not drink alcohol before bedtime.  Do not consume caffeine-containing beverages after noon or within 9 hours of intended bedtime.  Get regular exercise/physical activity in your life, but not within 2 hours of planned bedtime.  Do not take naps.   Do not eat within 2 hours of planned bedtime.  Melatonin, 3-5 mg 30-60 minutes before planned bedtime may be helpful.   The bed should be for sleep or sex only. If after 20-30 minutes you are unable to fall asleep, get up and do something relaxing. Do this until you feel ready to go to sleep again.   Coping skills Choose 5 that work for you:  Take a deep breath  Count to 20  Read a book  Do a puzzle  Meditate  Bake  Sing  Knit  Garden  Pray  Go outside  Call a friend  Listen to music  Take a walk  Color  Send a note  Take a bath  Watch a movie  Be alone in a quiet place  Pet an animal  Visit a friend  Journal  Exercise  Stretch   Let us know if you need anything.

## 2017-11-27 NOTE — Progress Notes (Signed)
Pre visit review using our clinic review tool, if applicable. No additional management support is needed unless otherwise documented below in the visit note. 

## 2017-11-27 NOTE — Progress Notes (Signed)
Hematology and Oncology Follow Up Visit  [name] [id number] Feb 05, 1950 67 y.o. 11/27/2017   Principle Diagnosis:  Stage IIA (T3N0M0) rectal cancer - s/p neoadjuvant chemo/radiation therapy and surgery in 07/2014  Current Therapy:   Observation   Interim History:  [name] is here today for follow-up. She is recuperating from surgical removal of what was found to be a left vestibular schwannoma in August. This has been difficult for her as she states she has lost 1/3 of her hearing. She also experienced hypermania with the steroids and was also treated for a UTI in early September.  The incision behind her left ear has healed nicely. No redness, swelling or discharge indicating infection.  She has had anxiety since surgery and has not been sleeping well. She saw Dr. Nani Ravens earlier today and he has adjusted her medications to start Abilify and is having her stop her Seroquel to see if this helps.  She also has an appointment to see a counselor for some emotional support.  No that her tumor has been removed her balance his significantly improved. No falls or syncopal episodes to report.  CEA in April was < 1.0.  She has had no issue with bleeding, no bruising or petechiae.  No fever, chills, n/v, cough, rash, chest pain, palpitations, abdominal pain or changes in bowel or bladder habits.  No swelling, tenderness, numbness or tingling in her extremities. No c/o pain at this time.  No lymphadenopathy noted on exam.  Her blood sugars have not been well controlled.  She has maintained a good appetite and is staying well hydrated. Her weight is stable.   ECOG Performance Status: 1 - Symptomatic but completely ambulatory  Medications:  Allergies as of 11/27/2017   No Known Allergies     Medication List        Accurate as of 11/27/17  3:07 PM. Always use your most recent med list.          ACCU-CHEK NANO SMARTVIEW w/Device Kit Test once daily E11.59   ARIPiprazole 2 MG  tablet Commonly known as:  ABILIFY Take 1 tablet (2 mg total) by mouth daily.   aspirin 81 MG tablet Take 81 mg by mouth at bedtime.   atorvastatin 10 MG tablet Commonly known as:  LIPITOR Take 1 tablet (10 mg total) by mouth daily.   conjugated estrogens vaginal cream Commonly known as:  PREMARIN Apply pea size amount daily for 1 week then 2-3 times per week.   CREON 24000-76000 units Cpep Generic drug:  Pancrelipase (Lip-Prot-Amyl) TAKE 1 CAPSULE BY MOUTH THREE TIMES DAILY WITH MEALS   diphenoxylate-atropine 2.5-0.025 MG tablet Commonly known as:  LOMOTIL TAKE 1 TABLET BY MOUTH FOUR TIMES DAILY AS NEEDED FOR DIARRHEA/ LOOSE STOOL   fesoterodine 4 MG Tb24 tablet Commonly known as:  TOVIAZ Take 1 tablet (4 mg total) by mouth daily.   glucose blood test strip Test once daily E11.59   INSULIN SYRINGE 1CC/31GX5/16" 31G X 5/16" 1 ML Misc Use with 88 units of Lantus nightly.   LANTUS 100 UNIT/ML injection Generic drug:  insulin glargine INJECT 88 UNITS UNDER THE SKIN AT BEDTIME   metFORMIN 1000 MG tablet Commonly known as:  GLUCOPHAGE TAKE 1 TABLET BY MOUTH TWICE DAILY WITH A MEAL   methenamine 1 g tablet Commonly known as:  HIPREX TAKE 1 TABLET(1 GRAM) BY MOUTH TWICE DAILY WITH A MEAL   metoprolol tartrate 25 MG tablet Commonly known as:  LOPRESSOR TAKE 1 TABLET BY MOUTH TWICE DAILY  sertraline 50 MG tablet Commonly known as:  ZOLOFT TAKE 1 TABLET(50MG TOTAL) BY MOUTH AT BEDTIME   Vitamin D (Ergocalciferol) 50000 units Caps capsule Commonly known as:  DRISDOL TAKE 1 CAPSULE BY MOUTH 1 TIME A WEEK   WOMENS MULTIVITAMIN PO Take by mouth.       Allergies: No Known Allergies  Past Medical History, Surgical history, Social history, and Family History were reviewed and updated.  Review of Systems: All other 10 point review of systems is negative.   Physical Exam:  weight is 148 lb 1.9 oz (67.2 kg). Her oral temperature is 98.1 F (36.7 C). Her blood  pressure is 127/64 and her pulse is 79. Her respiration is 18 and oxygen saturation is 95%.   Wt Readings from Last 3 Encounters:  11/27/17 148 lb 1.9 oz (67.2 kg)  11/27/17 148 lb 2 oz (67.2 kg)  10/27/17 152 lb (68.9 kg)    Ocular: Sclerae unicteric, pupils equal, round and reactive to light Ear-nose-throat: Oropharynx clear, dentition fair Lymphatic: No cervical, supraclavicular or axillary adenopathy Lungs no rales or rhonchi, good excursion bilaterally Heart regular rate and rhythm, no murmur appreciated Abd soft, nontender, positive bowel sounds, no liver or spleen tip palpated on exam, no fluid wave  MSK no focal spinal tenderness, no joint edema Neuro: non-focal, well-oriented, appropriate affect Breasts: Deferred   Lab Results  Component Value Date   WBC 5.1 11/27/2017   HGB 13.1 11/27/2017   HCT 39.5 11/27/2017   MCV 93.2 11/27/2017   PLT 148 (L) 11/27/2017   Lab Results  Component Value Date   FERRITIN 54 05/29/2017   IRON 97 05/29/2017   TIBC 323 05/29/2017   UIBC 226 05/29/2017   IRONPCTSAT 30 05/29/2017   Lab Results  Component Value Date   RETICCTPCT 4.51 (H) 05/30/2014   RBC 4.24 11/27/2017   RETICCTABS 137.10 (H) 05/30/2014   No results found for: KPAFRELGTCHN, LAMBDASER, KAPLAMBRATIO No results found for: IGGSERUM, IGA, IGMSERUM No results found for: Kathrynn Ducking, MSPIKE, SPEI   Chemistry      Component Value Date/Time   NA 142 06/03/2017 0955   NA 145 12/03/2016 1134   NA 138 05/28/2016 1139   K 4.7 06/03/2017 0955   K 4.6 12/03/2016 1134   K 4.7 05/28/2016 1139   CL 103 06/03/2017 0955   CL 105 12/03/2016 1134   CO2 31 06/03/2017 0955   CO2 26 12/03/2016 1134   CO2 25 05/28/2016 1139   BUN 16 06/03/2017 0955   BUN 12 12/03/2016 1134   BUN 17.1 05/28/2016 1139   CREATININE 0.57 06/03/2017 0955   CREATININE 0.74 05/29/2017 1059   CREATININE 0.7 12/03/2016 1134   CREATININE 0.8 05/28/2016  1139      Component Value Date/Time   CALCIUM 9.5 06/03/2017 0955   CALCIUM 9.8 12/03/2016 1134   CALCIUM 9.5 05/28/2016 1139   ALKPHOS 37 (L) 06/03/2017 0955   ALKPHOS 51 12/03/2016 1134   ALKPHOS 44 05/28/2016 1139   AST 15 06/03/2017 0955   AST 21 05/29/2017 1059   AST 16 05/28/2016 1139   ALT 23 06/03/2017 0955   ALT 33 05/29/2017 1059   ALT 39 12/03/2016 1134   ALT 23 05/28/2016 1139   BILITOT 0.4 06/03/2017 0955   BILITOT 0.3 05/29/2017 1059   BILITOT 0.35 05/28/2016 1139       Impression and Plan: Ms. Molock is a very pleasant 67 yo caucasian female with history of stage  IIa rectal cancer treated with chemo/radiation/surgery in 2016. She is recuperating her surgery to remove a left vestibular schwannoma in August. She had some complications with UTI and hypermania but is starting to finally feel better.  Exam today was negative. CEA result is pending.  We will plan to see her back in another 6 months.  She will contact our office with any questions or concerns. We can certainly see her sooner if need be.   Laverna Peace, NP 10/24/20193:07 PM

## 2017-11-27 NOTE — Telephone Encounter (Signed)
Requesting a 90 day supply.

## 2017-11-27 NOTE — Progress Notes (Signed)
Chief Complaint  Patient presents with  . Follow-up    sleep medication    Subjective: Patient is a 67 y.o. female here for sleep f/u.  Was on Seroquel to help with sleep and did not do well.  It made her too drowsy and felt hung over the next day.  She tried cutting the tab in half to 25 mg nightly and had the same thing to a lesser extent.  She has never been on trazodone or any other atypical antipsychotic.  She is getting set up with a counselor to help with her anxiety and hopefully sleep as well.   ROS: Psych: +insomnia  Past Medical History:  Diagnosis Date  .  Paroxysmal SVT (ANVRT)    S/p AV nodal ablation Dr. Lovena Le 09/28/08   . Anxiety   . CAD    Coronary disease status post non-ST elevation MI in May 2010 with  PCI of the left circumflex on Jun 24, 2008. She then had a PCI of  the RCA on July 15, 2008. 02/24/12 Cath, Severe 95% LAD, ostial 95% circ, ostial RCA, patent stents in distal RCA and mid circ normal LV 02/26/12 CABG with LIMA to LAD, SVG to RCA, and SVG to OM Dr. Roxan Hockey    . Cancer [facility])    adenocarcinoma of rectum  . Cataract   . Depression   . Diabetes mellitus, insulin dependent (IDDM), uncontrolled ([place])   . Heart murmur   . Hyperlipidemia   . Hypertension   . Hypertensive heart disease   . S/P radiation therapy 04/11/14-05/18/14   50.4Gy rectal  . Vertigo    Result of chemotherapy; has been to ENT and told nothing to be done    Objective: BP 114/62 (BP Location: Left Arm, Patient Position: Sitting, Cuff Size: Normal)   Pulse 76   Temp 98.4 F (36.9 C) (Oral)   Ht 5' (1.524 m)   Wt 148 lb 2 oz (67.2 kg)   SpO2 95%   BMI 28.93 kg/m  General: Awake, appears stated age HEENT: Ear canals patent, TM's neg, HOH Heart: RRR, no LE edema Lungs: CTAB, no rales, wheezes or rhonchi. No accessory muscle use Psych: Age appropriate judgment and insight, normal affect and mood  Assessment and Plan: Insomnia, unspecified type - Plan: ARIPiprazole (ABILIFY)  2 MG tablet  Flu vaccine need - Plan: Flu vaccine HIGH DOSE PF (Fluzone High dose)  # for LB BH provided. Try low dose of Abilify. Stop Seroquel.  If no improvement, will consider trazodone or low-dose doxepin. I think once her hearing gets sorted out, she may have less anxiety overall and this may help her sleep.  Cognitive behavioral therapy through counseling may also be helpful in coming off the medication in the future, which she is interested in. Follow-up in 4 weeks. The patient voiced understanding and agreement to the plan.  [place], DO 11/27/17  3:31 PM

## 2017-11-28 LAB — FERRITIN: FERRITIN: 31 ng/mL (ref 11–307)

## 2017-11-28 LAB — LACTATE DEHYDROGENASE: LDH: 175 U/L (ref 98–192)

## 2017-11-28 LAB — IRON AND TIBC
Iron: 99 ug/dL (ref 41–142)
Saturation Ratios: 29 % (ref 21–57)
TIBC: 343 ug/dL (ref 236–444)
UIBC: 244 ug/dL

## 2017-11-28 LAB — VITAMIN D 25 HYDROXY (VIT D DEFICIENCY, FRACTURES): Vit D, 25-Hydroxy: 30 ng/mL (ref 30.0–100.0)

## 2017-11-28 LAB — CEA (IN HOUSE-CHCC)

## 2017-12-06 IMAGING — MG DIGITAL SCREENING BILATERAL MAMMOGRAM WITH CAD
4 series · 4 of 4 positions shown · non-contrast
Comparison: None.

CLINICAL DATA: Screening.

EXAM:
DIGITAL SCREENING BILATERAL MAMMOGRAM WITH CAD

[R CC]
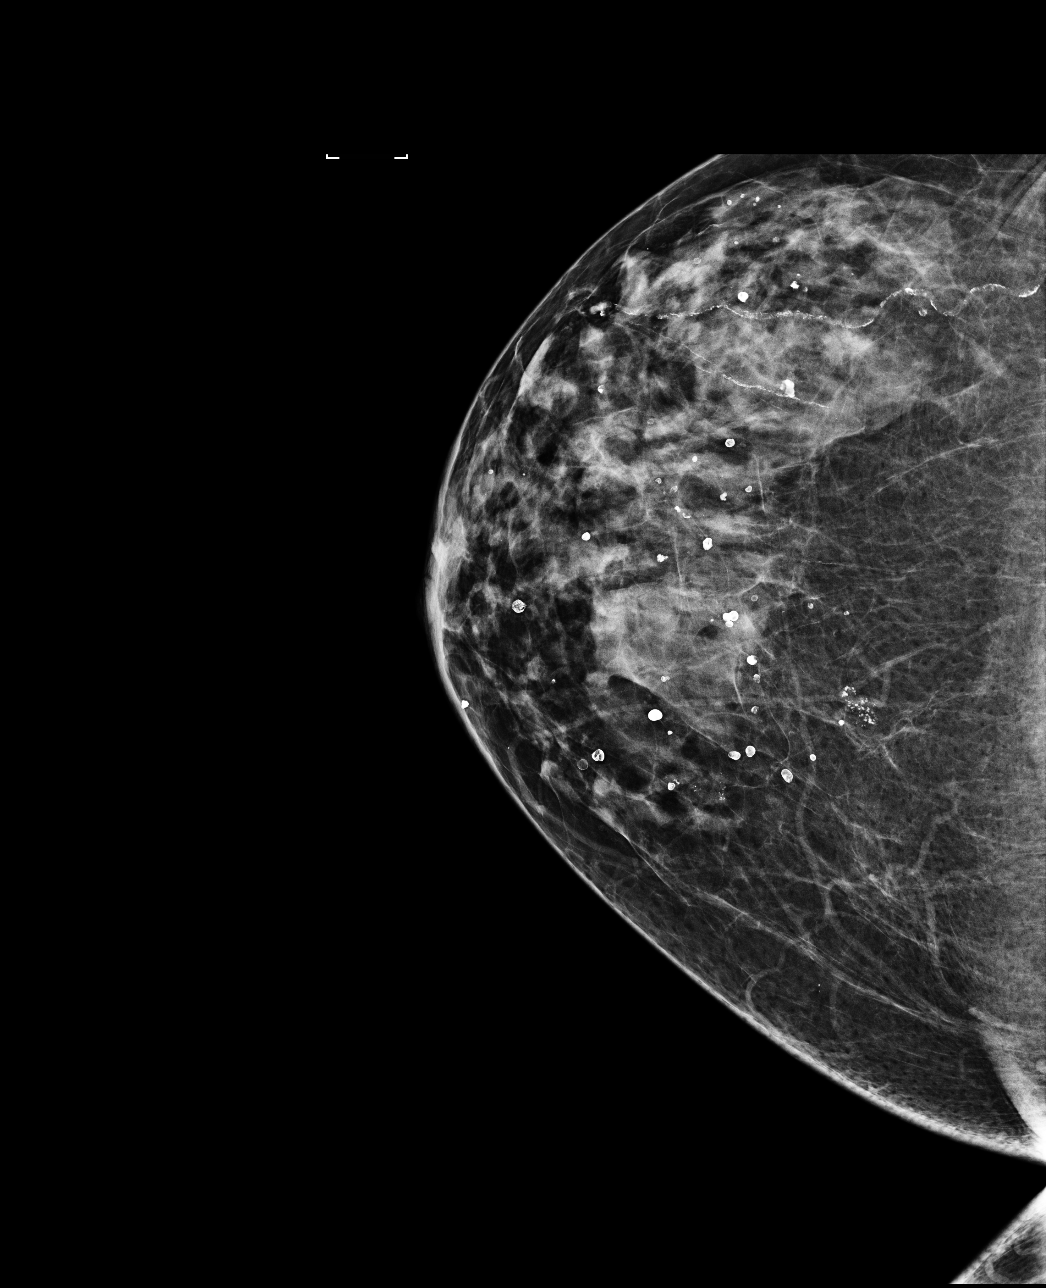

[L MLO]
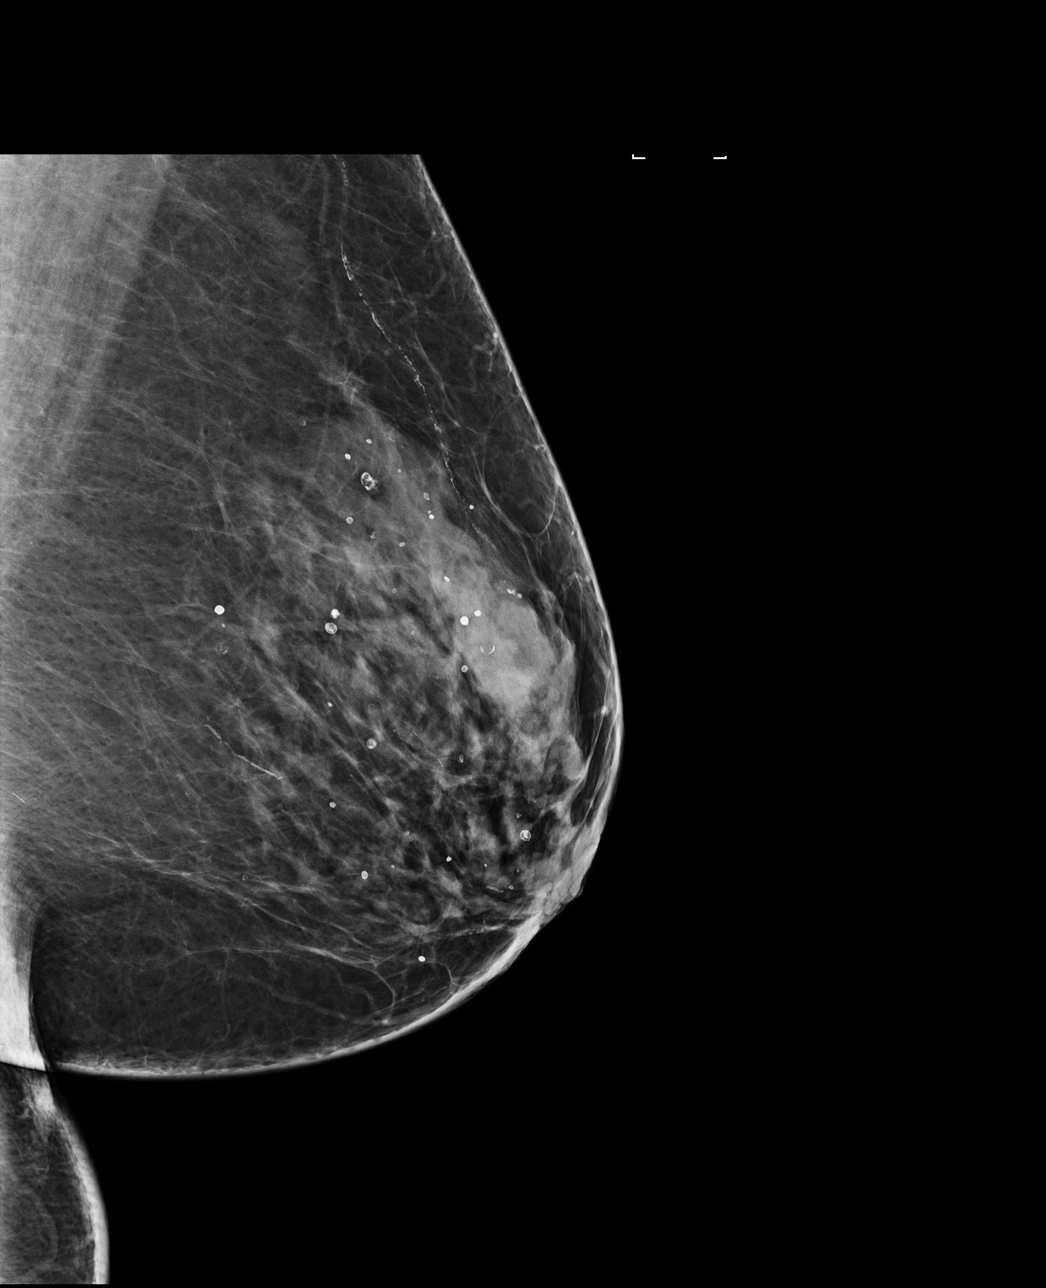

[L CC]
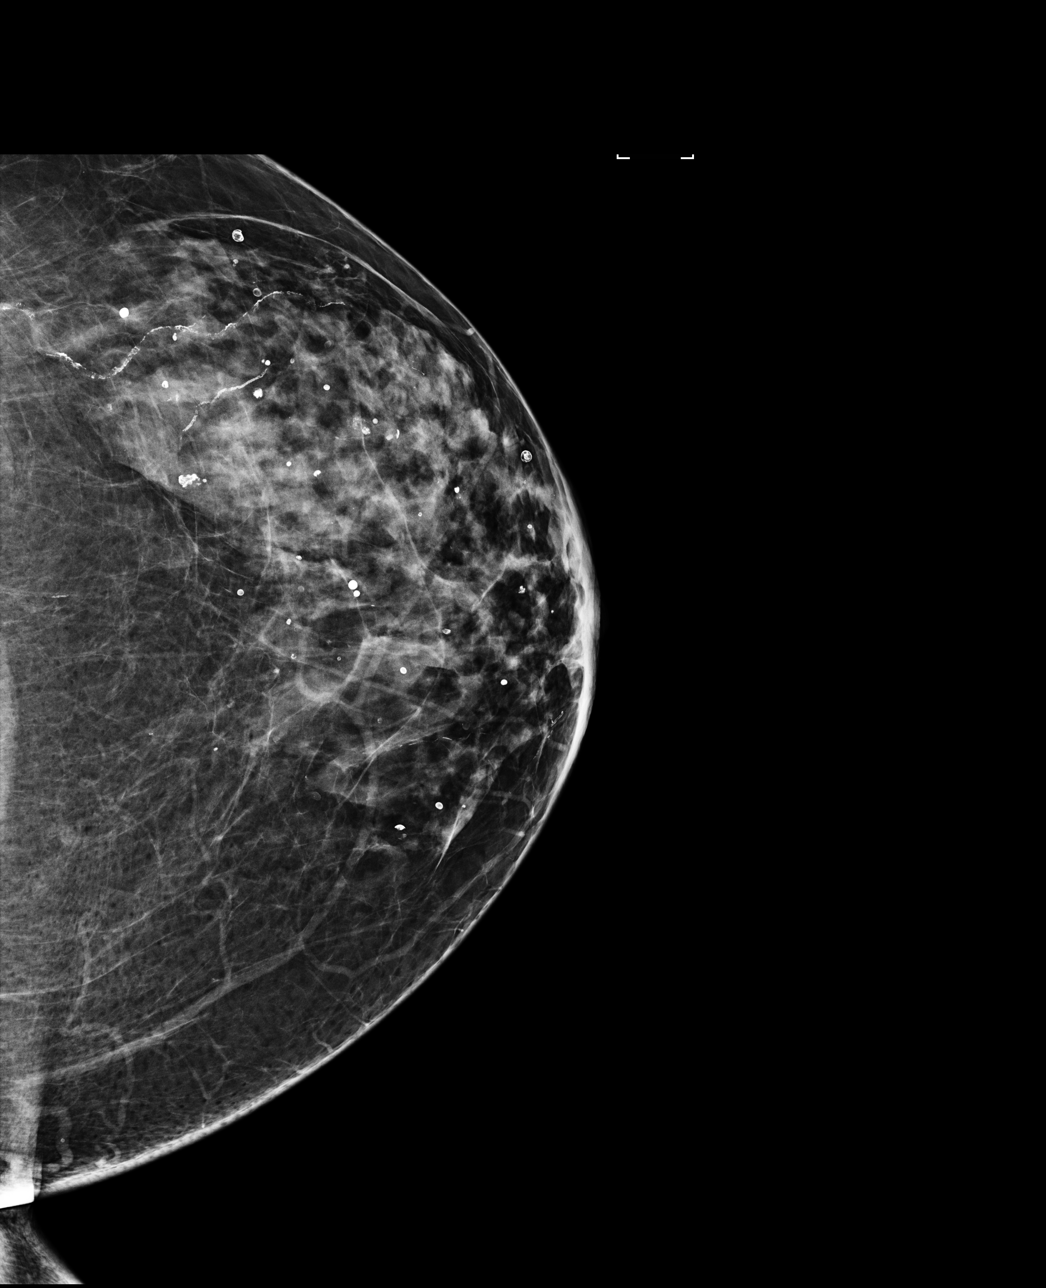

[R MLO]
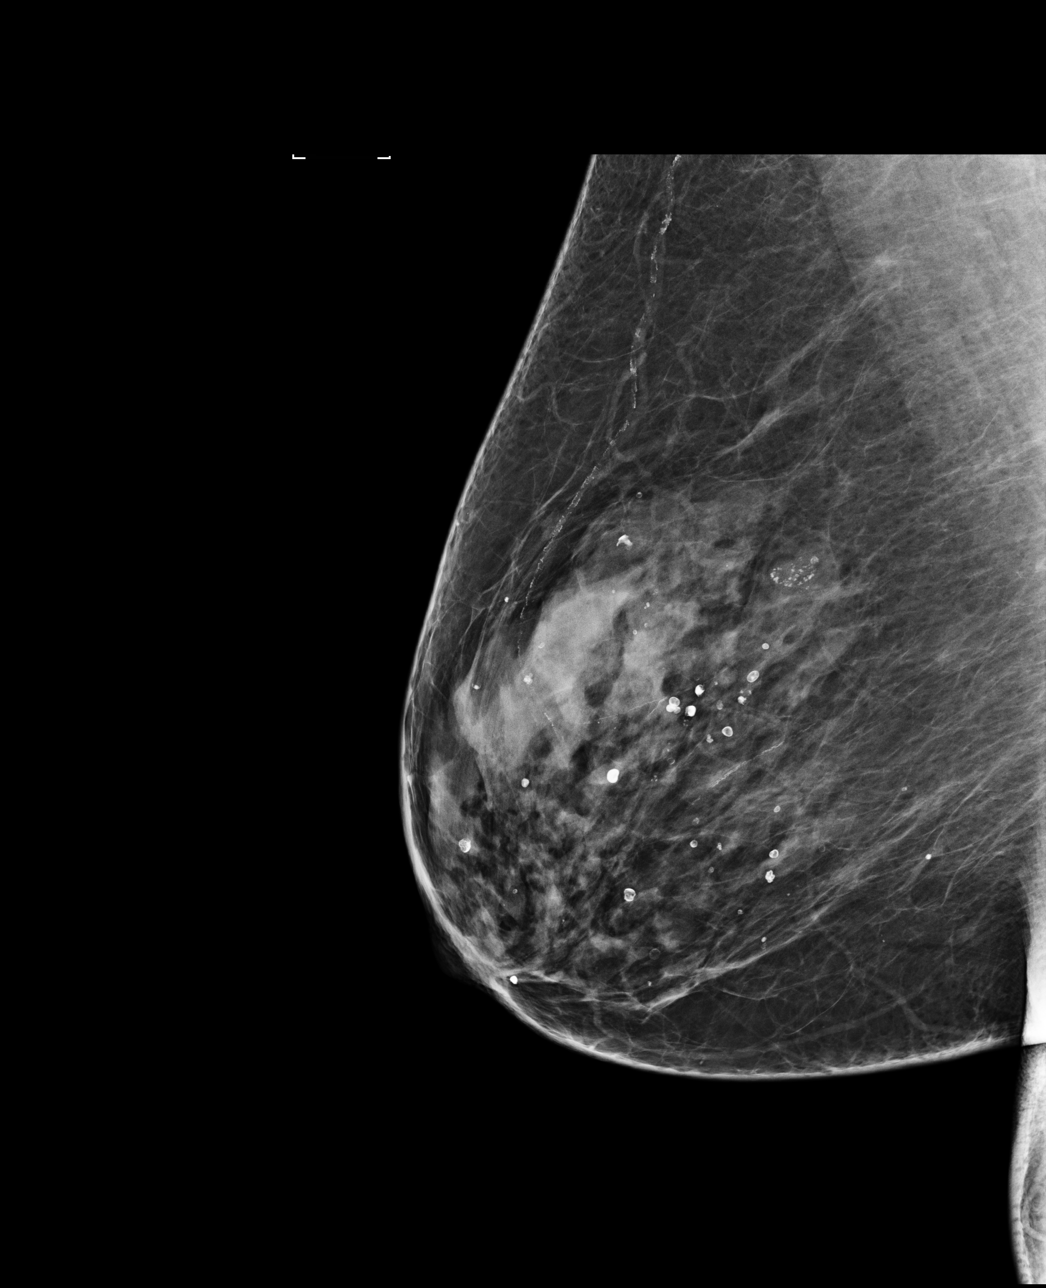

[4 of 4 positions shown; findings below may reference images not displayed]

ACR Breast Density Category c: The breast tissue is heterogeneously
dense, which may obscure small masses
FINDINGS: There are no findings suspicious for malignancy. Images were
processed with CAD.
IMPRESSION: No mammographic evidence of malignancy. A result letter of this
screening mammogram will be mailed directly to the patient.

RECOMMENDATION:
Screening mammogram in one year. (Code:[id number])

BI-RADS CATEGORY  1: Negative.

## 2017-12-18 DIAGNOSIS — E113411 Type 2 diabetes mellitus with severe nonproliferative diabetic retinopathy with macular edema, right eye: Secondary | ICD-10-CM | POA: Diagnosis not present

## 2017-12-18 DIAGNOSIS — H3582 Retinal ischemia: Secondary | ICD-10-CM | POA: Diagnosis not present

## 2017-12-18 DIAGNOSIS — D3131 Benign neoplasm of right choroid: Secondary | ICD-10-CM | POA: Diagnosis not present

## 2017-12-18 DIAGNOSIS — E113492 Type 2 diabetes mellitus with severe nonproliferative diabetic retinopathy without macular edema, left eye: Secondary | ICD-10-CM | POA: Diagnosis not present

## 2017-12-25 ENCOUNTER — Ambulatory Visit (INDEPENDENT_AMBULATORY_CARE_PROVIDER_SITE_OTHER): Payer: Medicare HMO | Admitting: Family Medicine

## 2017-12-25 ENCOUNTER — Encounter: Payer: Self-pay | Admitting: Family Medicine

## 2017-12-25 VITALS — BP 128/60 | HR 75 | Temp 98.1°F | Ht 60.0 in | Wt 150.0 lb

## 2017-12-25 DIAGNOSIS — F418 Other specified anxiety disorders: Secondary | ICD-10-CM | POA: Diagnosis not present

## 2017-12-25 DIAGNOSIS — G4700 Insomnia, unspecified: Secondary | ICD-10-CM

## 2017-12-25 NOTE — Progress Notes (Signed)
Chief Complaint  Patient presents with  . Follow-up    Subjective: Patient is a 67 y.o. female here for f/u insomnia.  She is currently on Abilify 2 mg qhs. She is not improved from her sleep, but is much less depressed. Still having nervousness, insomnia and irritability. She is set up with counseling. Tolerating med well and having no AE's.   ROS: Psych: as noted in HPI  Past Medical History:  Diagnosis Date  .  Paroxysmal SVT (ANVRT)    S/p AV nodal ablation Dr. Lovena Le 09/28/08   . Anxiety   . CAD    Coronary disease status post non-ST elevation MI in May 2010 with  PCI of the left circumflex on Jun 24, 2008. She then had a PCI of  the RCA on July 15, 2008. 02/24/12 Cath, Severe 95% LAD, ostial 95% circ, ostial RCA, patent stents in distal RCA and mid circ normal LV 02/26/12 CABG with LIMA to LAD, SVG to RCA, and SVG to OM Dr. Roxan Hockey    . Cancer [facility])    adenocarcinoma of rectum  . Cataract   . Depression   . Diabetes mellitus, insulin dependent (IDDM), uncontrolled ([place])   . Heart murmur   . Hyperlipidemia   . Hypertension   . Hypertensive heart disease   . S/P radiation therapy 04/11/14-05/18/14   50.4Gy rectal  . Vertigo    Result of chemotherapy; has been to ENT and told nothing to be done    Objective: BP 128/60 (BP Location: Left Arm, Patient Position: Sitting, Cuff Size: Normal)   Pulse 75   Temp 98.1 F (36.7 C) (Oral)   Ht 5' (1.524 m)   Wt 150 lb (68 kg)   SpO2 94%   BMI 29.29 kg/m  General: Awake, appears stated age HEENT: MMM Heart: RRR, no LE edema Lungs: CTAB, no rales, wheezes or rhonchi. No accessory muscle use Psych: Age appropriate judgment and insight, normal affect and mood  Assessment and Plan: Insomnia, unspecified type  Anxiety with depression  Increase dose of Abilify from 2 mg/d to 4 mg/d. Cont with counseling. Counseled on exercise. F/u in 1 mo. If no improvement, will start trazodone vs BuSpar.  The patient voiced understanding  and agreement to the plan.  [place], DO 12/25/17  2:09 PM

## 2017-12-25 NOTE — Progress Notes (Signed)
Pre visit review using our clinic review tool, if applicable. No additional management support is needed unless otherwise documented below in the visit note. 

## 2017-12-25 NOTE — Patient Instructions (Signed)
Continue with counselor.  Take 2 tabs of aripiprazole until we see each other next.  Let us know if you need anything.

## 2018-01-08 ENCOUNTER — Other Ambulatory Visit: Payer: Self-pay | Admitting: *Deleted

## 2018-01-08 MED ORDER — VITAMIN D (ERGOCALCIFEROL) 1.25 MG (50000 UNIT) PO CAPS: ORAL_CAPSULE | ORAL | 0 refills | Status: DC

## 2018-01-08 MED ORDER — DIPHENOXYLATE-ATROPINE 2.5-0.025 MG PO TABS: ORAL_TABLET | ORAL | 2 refills | Status: DC

## 2018-01-12 DIAGNOSIS — H53451 Other localized visual field defect, right eye: Secondary | ICD-10-CM | POA: Diagnosis not present

## 2018-01-12 DIAGNOSIS — H527 Unspecified disorder of refraction: Secondary | ICD-10-CM | POA: Diagnosis not present

## 2018-01-12 DIAGNOSIS — E113592 Type 2 diabetes mellitus with proliferative diabetic retinopathy without macular edema, left eye: Secondary | ICD-10-CM | POA: Diagnosis not present

## 2018-01-12 DIAGNOSIS — E113511 Type 2 diabetes mellitus with proliferative diabetic retinopathy with macular edema, right eye: Secondary | ICD-10-CM | POA: Diagnosis not present

## 2018-01-26 ENCOUNTER — Other Ambulatory Visit: Payer: Self-pay | Admitting: Family Medicine

## 2018-01-26 MED ORDER — ARIPIPRAZOLE 2 MG PO TABS: 4.0000 mg | ORAL_TABLET | Freq: Every day | ORAL | 1 refills | Status: DC

## 2018-01-26 NOTE — Telephone Encounter (Signed)
Requested medication (s) are due for refill today: Yes  Requested medication (s) are on the active medication list: Yes  Last refill:  12/25/17  Future visit scheduled: Yes  Notes to clinic:  Unable to refill per protocol. Patient requesting increase dosage.     Requested Prescriptions  Pending Prescriptions Disp Refills   ARIPiprazole (ABILIFY) 2 MG tablet 90 tablet 1    Sig: Take 2 tablets (4 mg total) by mouth daily.     Not Delegated - Psychiatry:  Antipsychotics - Second Generation (Atypical) - aripiprazole Failed - 01/26/2018  1:13 PM      Failed - This refill cannot be delegated      Passed - Valid encounter within last 6 months    Recent Outpatient Visits          1 month ago Insomnia, unspecified type   Archivist at [place], DO   2 months ago Insomnia, unspecified type   Archivist at [place], [place]   3 months ago Urinary frequency   Archivist at [facility], [place], DO   8 months ago Type 2 diabetes mellitus with vascular disease [facility])   Archivist at [employer], [place], [place]   11 months ago Type 2 diabetes mellitus with vascular disease ([place])   Therapist, music at [place], [place]      Future Appointments            In 2 weeks [place], [place], [place] at [employer], [facility]

## 2018-01-26 NOTE — Telephone Encounter (Signed)
Copied from [place] [id number]. Topic: Quick Communication - Rx Refill/Question >> Jan 26, 2018 12:42 PM Oneta Rack wrote:  Medication: ARIPiprazole (ABILIFY) 2 MG tablet  requesting 5 MG  (patient seeking a refill to hold her over until 02/11/2018 appointment)   Has the patient contacted their pharmacy? No, patient requesting MG increase  Preferred Pharmacy (with phone number or street name):  [facility], [place] - [address] [phone] (Phone) [phone] (Fax)   Agent: Please be advised that RX refills may take up to 3 business days. We ask that you follow-up with your pharmacy.

## 2018-01-26 NOTE — Telephone Encounter (Signed)
Patient requesting an increase in dosage to 5 mg to hold her until her 02/11/18 appointment.

## 2018-01-30 MED ORDER — ARIPIPRAZOLE 5 MG PO TABS: 5.0000 mg | ORAL_TABLET | Freq: Every day | ORAL | 2 refills | Status: DC

## 2018-01-30 NOTE — Telephone Encounter (Signed)
5 mg tab sent. TY.

## 2018-01-30 NOTE — Telephone Encounter (Signed)
She agrees to send it to [facility], but she did state PCP had wanted to change this medication to 5 mg and now she does agree to that as well. So please change (she stated if still ok to do so the 5 mg) send to [place]

## 2018-01-30 NOTE — Addendum Note (Signed)
Addended by: Ames Coupe on: 01/30/2018 12:59 PM   Modules accepted: Orders

## 2018-01-30 NOTE — Telephone Encounter (Signed)
We can send to [facility] and a 30 day supply would be $24.36. We could also change to something else. TY.

## 2018-01-30 NOTE — Telephone Encounter (Signed)
Pt states the she is out of the ARIPiprazole (ABILIFY) 2 MG tablet  And the pharmacy stated that her insurance will not cover this med. Pt would like to see what Dr. Nani Ravens advises her to do.

## 2018-02-02 ENCOUNTER — Telehealth: Payer: Self-pay

## 2018-02-02 NOTE — Telephone Encounter (Signed)
PA approved.   PA Case: [id number], Status: Approved, Coverage Starts on: 02/02/2018 11:25:50 AM, Coverage Ends on: 02/02/2018 11:25:50 AM. Questions? Contact [phone]

## 2018-02-02 NOTE — Telephone Encounter (Signed)
PA initiated via Covermymeds; KEY: [id number]. Awaiting determination.

## 2018-02-05 ENCOUNTER — Ambulatory Visit (INDEPENDENT_AMBULATORY_CARE_PROVIDER_SITE_OTHER): Payer: Medicare HMO | Admitting: Family Medicine

## 2018-02-05 ENCOUNTER — Encounter: Payer: Self-pay | Admitting: Family Medicine

## 2018-02-05 VITALS — BP 140/76 | HR 65 | Temp 98.0°F | Ht 60.0 in | Wt 153.4 lb

## 2018-02-05 DIAGNOSIS — G4700 Insomnia, unspecified: Secondary | ICD-10-CM

## 2018-02-05 DIAGNOSIS — Z23 Encounter for immunization: Secondary | ICD-10-CM

## 2018-02-05 DIAGNOSIS — E1159 Type 2 diabetes mellitus with other circulatory complications: Secondary | ICD-10-CM | POA: Diagnosis not present

## 2018-02-05 NOTE — Patient Instructions (Addendum)
Keep the diet clean and stay active.  Give us 2-3 business days to get the results of your labs back.   Let us know if you need anything.  

## 2018-02-05 NOTE — Progress Notes (Signed)
Chief Complaint  Patient presents with  . Follow-up    Subjective: Patient is a 68 y.o. female here for insomnia f/u.  Had increased Abilify from 2 mg/d to 4 mg/d. She is now on 5 mg/d and doing very well. She is sleeping better and tolerating well. No desire to change dose.  Addressed DM a little. UTD w PCV. Diet fair outside of holidays. Needs to get more exercise.    ROS: Psych: +insomnia  Past Medical History:  Diagnosis Date  .  Paroxysmal SVT (ANVRT)    S/p AV nodal ablation Dr. Lovena Le 09/28/08   . Anxiety   . CAD    Coronary disease status post non-ST elevation MI in May 2010 with  PCI of the left circumflex on Jun 24, 2008. She then had a PCI of  the RCA on July 15, 2008. 02/24/12 Cath, Severe 95% LAD, ostial 95% circ, ostial RCA, patent stents in distal RCA and mid circ normal LV 02/26/12 CABG with LIMA to LAD, SVG to RCA, and SVG to OM Dr. Roxan Hockey    . Cancer [facility])    adenocarcinoma of rectum  . Cataract   . Depression   . Diabetes mellitus, insulin dependent (IDDM), uncontrolled ([place])   . Heart murmur   . Hyperlipidemia   . Hypertension   . Hypertensive heart disease   . S/P radiation therapy 04/11/14-05/18/14   50.4Gy rectal  . Vertigo    Result of chemotherapy; has been to ENT and told nothing to be done    Objective: BP 140/76 (BP Location: Left Arm, Patient Position: Sitting, Cuff Size: Normal)   Pulse 65   Temp 98 F (36.7 C) (Oral)   Ht 5' (1.524 m)   Wt 153 lb 6 oz (69.6 kg)   SpO2 97%   BMI 29.95 kg/m  General: Awake, appears stated age Heart: RRR, No LE edema Lungs: CTAB, no rales, wheezes or rhonchi. No accessory muscle use Neuro: Sensation blunted to pinprick, light touch intact. Skin: No lesions on feet Psych: Age appropriate judgment and insight, normal affect and mood  Assessment and Plan: Insomnia, unspecified type  Type 2 diabetes mellitus with vascular disease ([place]) - Plan: HM DIABETES FOOT EXAM, Hemoglobin A1c  Cont Abilify.  Ck  A1c. Tdap today. Counseled on diet and exercise.  F/u in 6 mo.  The patient voiced understanding and agreement to the plan.  [place], DO 02/05/18  1:55 PM

## 2018-02-05 NOTE — Addendum Note (Signed)
Addended by: Sharon Seller B on: 02/05/2018 02:12 PM   Modules accepted: Orders

## 2018-02-05 NOTE — Progress Notes (Signed)
Pre visit review using our clinic review tool, if applicable. No additional management support is needed unless otherwise documented below in the visit note. 

## 2018-02-11 ENCOUNTER — Ambulatory Visit: Payer: Medicare HMO | Admitting: Family Medicine

## 2018-02-12 ENCOUNTER — Other Ambulatory Visit: Payer: Self-pay | Admitting: Family Medicine

## 2018-02-12 DIAGNOSIS — E113411 Type 2 diabetes mellitus with severe nonproliferative diabetic retinopathy with macular edema, right eye: Secondary | ICD-10-CM | POA: Diagnosis not present

## 2018-02-12 DIAGNOSIS — E113492 Type 2 diabetes mellitus with severe nonproliferative diabetic retinopathy without macular edema, left eye: Secondary | ICD-10-CM | POA: Diagnosis not present

## 2018-02-12 DIAGNOSIS — H43813 Vitreous degeneration, bilateral: Secondary | ICD-10-CM | POA: Diagnosis not present

## 2018-02-12 DIAGNOSIS — H3582 Retinal ischemia: Secondary | ICD-10-CM | POA: Diagnosis not present

## 2018-03-09 ENCOUNTER — Telehealth: Payer: Self-pay | Admitting: *Deleted

## 2018-03-09 NOTE — Telephone Encounter (Signed)
Message received from patient requesting most recent Vitamin D levels.  Call placed back to patient and results given to patient.  Pt appreciative of call back and has no questions at this time.

## 2018-03-14 ENCOUNTER — Other Ambulatory Visit: Payer: Self-pay | Admitting: Family Medicine

## 2018-03-16 ENCOUNTER — Other Ambulatory Visit: Payer: Self-pay | Admitting: Family Medicine

## 2018-04-13 ENCOUNTER — Other Ambulatory Visit: Payer: Self-pay | Admitting: Family Medicine

## 2018-04-15 ENCOUNTER — Telehealth: Payer: Self-pay | Admitting: Family Medicine

## 2018-04-17 ENCOUNTER — Other Ambulatory Visit: Payer: Self-pay | Admitting: Family Medicine

## 2018-04-22 ENCOUNTER — Other Ambulatory Visit: Payer: Self-pay | Admitting: Family Medicine

## 2018-04-22 ENCOUNTER — Telehealth: Payer: Self-pay | Admitting: Family Medicine

## 2018-04-22 MED ORDER — ARIPIPRAZOLE 5 MG PO TABS: 5.0000 mg | ORAL_TABLET | Freq: Every day | ORAL | 2 refills | Status: DC

## 2018-04-22 MED ORDER — METFORMIN HCL 1000 MG PO TABS: 1000.0000 mg | ORAL_TABLET | Freq: Two times a day (BID) | ORAL | 3 refills | Status: DC

## 2018-04-22 MED ORDER — INSULIN GLARGINE 100 UNIT/ML ~~LOC~~ SOLN: SUBCUTANEOUS | 5 refills | Status: DC

## 2018-04-22 NOTE — Telephone Encounter (Signed)
Sent in refills 

## 2018-04-22 NOTE — Telephone Encounter (Signed)
Pt called in to check the status of this refill  Pt is now a pt of Dr Nani Ravens

## 2018-04-22 NOTE — Telephone Encounter (Signed)
Copied from [place] [id number]. Topic: Quick Communication - See Telephone Encounter >> Apr 22, 2018  4:07 PM Vernona Rieger wrote: CRM for notification. See Telephone encounter for: 04/22/18.  Patient needs her ARIPiprazole (ABILIFY) 5 MG tablet refilled and she needs her metFORMIN (GLUCOPHAGE) 1000 MG tablet as well. The pharmacy keeps sending it to Dr Elease Hashimoto, she is not seeing him anymore. Please advise.  [place], [place] [address] [place] [address] Phone: [phone] Fax: [phone]

## 2018-04-28 ENCOUNTER — Other Ambulatory Visit: Payer: Self-pay | Admitting: Family Medicine

## 2018-05-08 ENCOUNTER — Other Ambulatory Visit: Payer: Self-pay | Admitting: Family Medicine

## 2018-05-11 ENCOUNTER — Other Ambulatory Visit: Payer: Self-pay | Admitting: Family Medicine

## 2018-05-12 ENCOUNTER — Other Ambulatory Visit: Payer: Self-pay | Admitting: Family Medicine

## 2018-05-12 MED ORDER — SERTRALINE HCL 50 MG PO TABS: ORAL_TABLET | ORAL | 1 refills | Status: DC

## 2018-05-12 NOTE — Telephone Encounter (Signed)
Last filled by Dr. Elease Hashimoto

## 2018-05-17 ENCOUNTER — Other Ambulatory Visit: Payer: Self-pay | Admitting: Family Medicine

## 2018-05-29 ENCOUNTER — Ambulatory Visit: Payer: Medicare HMO | Admitting: Family

## 2018-05-29 ENCOUNTER — Other Ambulatory Visit: Payer: Medicare HMO

## 2018-06-02 DIAGNOSIS — N3000 Acute cystitis without hematuria: Secondary | ICD-10-CM | POA: Diagnosis not present

## 2018-06-14 ENCOUNTER — Other Ambulatory Visit: Payer: Self-pay | Admitting: Family Medicine

## 2018-06-26 ENCOUNTER — Inpatient Hospital Stay: Payer: Medicare HMO | Attending: Hematology & Oncology

## 2018-06-26 ENCOUNTER — Inpatient Hospital Stay (HOSPITAL_BASED_OUTPATIENT_CLINIC_OR_DEPARTMENT_OTHER): Payer: Medicare HMO | Admitting: Family

## 2018-06-26 ENCOUNTER — Other Ambulatory Visit: Payer: Self-pay

## 2018-06-26 ENCOUNTER — Encounter: Payer: Self-pay | Admitting: Family

## 2018-06-26 VITALS — BP 137/48 | HR 78 | Resp 16 | Ht 62.0 in | Wt 155.1 lb

## 2018-06-26 DIAGNOSIS — Z85048 Personal history of other malignant neoplasm of rectum, rectosigmoid junction, and anus: Secondary | ICD-10-CM

## 2018-06-26 DIAGNOSIS — Z9221 Personal history of antineoplastic chemotherapy: Secondary | ICD-10-CM | POA: Insufficient documentation

## 2018-06-26 DIAGNOSIS — Z923 Personal history of irradiation: Secondary | ICD-10-CM

## 2018-06-26 DIAGNOSIS — C20 Malignant neoplasm of rectum: Secondary | ICD-10-CM

## 2018-06-26 DIAGNOSIS — D508 Other iron deficiency anemias: Secondary | ICD-10-CM

## 2018-06-26 DIAGNOSIS — E559 Vitamin D deficiency, unspecified: Secondary | ICD-10-CM

## 2018-06-26 LAB — CBC WITH DIFFERENTIAL (CANCER CENTER ONLY)
Abs Immature Granulocytes: 0.03 10*3/uL (ref 0.00–0.07)
Basophils Absolute: 0 10*3/uL (ref 0.0–0.1)
Basophils Relative: 0 %
Eosinophils Absolute: 0.1 10*3/uL (ref 0.0–0.5)
Eosinophils Relative: 2 %
HCT: 37.3 % (ref 36.0–46.0)
Hemoglobin: 12.8 g/dL (ref 12.0–15.0)
Immature Granulocytes: 1 %
Lymphocytes Relative: 13 %
Lymphs Abs: 0.8 10*3/uL (ref 0.7–4.0)
MCH: 32.4 pg (ref 26.0–34.0)
MCHC: 34.3 g/dL (ref 30.0–36.0)
MCV: 94.4 fL (ref 80.0–100.0)
Monocytes Absolute: 0.5 10*3/uL (ref 0.1–1.0)
Monocytes Relative: 8 %
Neutro Abs: 4.6 10*3/uL (ref 1.7–7.7)
Neutrophils Relative %: 76 %
Platelet Count: 161 10*3/uL (ref 150–400)
RBC: 3.95 MIL/uL (ref 3.87–5.11)
RDW: 12.9 % (ref 11.5–15.5)
WBC Count: 6 10*3/uL (ref 4.0–10.5)
nRBC: 0 % (ref 0.0–0.2)

## 2018-06-26 LAB — CMP (CANCER CENTER ONLY)
ALT: 14 U/L (ref 0–44)
AST: 12 U/L — ABNORMAL LOW (ref 15–41)
Albumin: 4.2 g/dL (ref 3.5–5.0)
Alkaline Phosphatase: 41 U/L (ref 38–126)
Anion gap: 10 (ref 5–15)
BUN: 18 mg/dL (ref 8–23)
CO2: 30 mmol/L (ref 22–32)
Calcium: 9.6 mg/dL (ref 8.9–10.3)
Chloride: 104 mmol/L (ref 98–111)
Creatinine: 0.79 mg/dL (ref 0.44–1.00)
GFR, Est AFR Am: >60 mL/min (ref >60)
GFR, Estimated: >60 mL/min (ref >60)
Glucose, Bld: 171 mg/dL — ABNORMAL HIGH (ref 70–99)
Potassium: 4.6 mmol/L (ref 3.5–5.1)
Sodium: 144 mmol/L (ref 135–145)
Total Bilirubin: 0.4 mg/dL (ref 0.3–1.2)
Total Protein: 6.3 g/dL — ABNORMAL LOW (ref 6.5–8.1)

## 2018-06-26 NOTE — Progress Notes (Signed)
Hematology and Oncology Follow Up Visit  [name] [id number] 22-Mar-1950 68 y.o. 06/26/2018   Principle Diagnosis:  Stage IIA (T3N0M0) rectal cancer - s/p neoadjuvant chemo/radiation therapy and surgery in 07/2014  Current Therapy:   Observation   Interim History:  [name] is here today for follow-up. She is doing well but states that has some fatigue at times. She states that due to the [facility] virus she has been bored to distraction.  CEA in October was < 1.00.  She has been walking for exercise and doing some yard work.  She has had no issues with infection. No fever, chills, n/v, cough, rash, dizziness, SOB, chest pain, palpitations, abdominal pain or changes in bowel or bladder habits.  No episodes of bleeding, no bruising or petechiae.  No lymphadenopathy noted on exam.   She states that she has a good appetite and is staying well hydrated. Her weight is stable.   ECOG Performance Status: 1 - Symptomatic but completely ambulatory  Medications:  Allergies as of 06/26/2018   No Known Allergies     Medication List       Accurate as of Jun 26, 2018  1:32 PM. If you have any questions, ask your nurse or doctor.        Accu-Chek Nano SmartView w/Device Kit Test once daily E11.59   ARIPiprazole 5 MG tablet Commonly known as:  ABILIFY TAKE ONE TABLET BY MOUTH DAILY   aspirin 81 MG tablet Take 81 mg by mouth at bedtime.   Aspirin Buf(CaCarb-MgCarb-MgO) 81 MG Tabs Take by mouth.   atorvastatin 10 MG tablet Commonly known as:  LIPITOR TAKE 1 TABLET(10 MG) BY MOUTH DAILY   conjugated estrogens vaginal cream Commonly known as:  PREMARIN Apply pea size amount daily for 1 week then 2-3 times per week.   Creon 24000-76000 units Cpep Generic drug:  Pancrelipase (Lip-Prot-Amyl) TAKE 1 CAPSULE BY MOUTH THREE TIMES DAILY WITH MEALS What changed:  See the new instructions.   diphenoxylate-atropine 2.5-0.025 MG tablet Commonly known as:  LOMOTIL TAKE 1 TABLET BY  MOUTH FOUR TIMES DAILY AS NEEDED FOR DIARRHEA/ LOOSE STOOL   Droplet Insulin Syringe 31G X 5/16" 1 ML Misc Generic drug:  Insulin Syringe-Needle U-100 USE AS DIRECTED WITH 88 UNITS OF LANTUS NIGHTLY.   fesoterodine 4 MG Tb24 tablet Commonly known as:  TOVIAZ Take 1 tablet (4 mg total) by mouth daily.   glucose blood test strip Commonly known as:  Accu-Chek SmartView Test once daily E11.59   insulin glargine 100 UNIT/ML injection Commonly known as:  Lantus INJECT 88 UNITS UNDER THE SKIN AT BEDTIME   metFORMIN 1000 MG tablet Commonly known as:  GLUCOPHAGE Take 1 tablet (1,000 mg total) by mouth 2 (two) times daily with a meal.   methenamine 1 g tablet Commonly known as:  HIPREX TAKE 1 TABLET(1 GRAM) BY MOUTH TWICE DAILY WITH A MEAL   metoprolol tartrate 25 MG tablet Commonly known as:  LOPRESSOR TAKE 1 TABLET BY MOUTH TWICE DAILY   sertraline 50 MG tablet Commonly known as:  ZOLOFT TAKE 1 TABLET(50MG TOTAL) BY MOUTH AT BEDTIME   Vitamin D (Ergocalciferol) 1.25 MG (50000 UT) Caps capsule Commonly known as:  DRISDOL TAKE 1 CAPSULE BY MOUTH 1 TIME A WEEK   WOMENS MULTIVITAMIN PO Take by mouth.       Allergies: No Known Allergies  Past Medical History, Surgical history, Social history, and Family History were reviewed and updated.  Review of Systems: All other 10 point review  of systems is negative.   Physical Exam:  height is '5\' 2"'  (1.575 m) and weight is 155 lb 1.9 oz (70.4 kg). Her blood pressure is 137/48 (abnormal) and her pulse is 78. Her respiration is 16 and oxygen saturation is 96%.   Wt Readings from Last 3 Encounters:  06/26/18 155 lb 1.9 oz (70.4 kg)  02/05/18 153 lb 6 oz (69.6 kg)  12/25/17 150 lb (68 kg)    Ocular: Sclerae unicteric, pupils equal, round and reactive to light Ear-nose-throat: Oropharynx clear, dentition fair Lymphatic: No cervical or supraclavicular adenopathy Lungs no rales or rhonchi, good excursion bilaterally Heart regular  rate and rhythm, no murmur appreciated Abd soft, nontender, positive bowel sounds, no liver or spleen tip palpated on exam, no fluid wave  MSK no focal spinal tenderness, no joint edema Neuro: non-focal, well-oriented, appropriate affect Breasts: Deferred   Lab Results  Component Value Date   WBC 6.0 06/26/2018   HGB 12.8 06/26/2018   HCT 37.3 06/26/2018   MCV 94.4 06/26/2018   PLT 161 06/26/2018   Lab Results  Component Value Date   FERRITIN 31 11/27/2017   IRON 99 11/27/2017   TIBC 343 11/27/2017   UIBC 244 11/27/2017   IRONPCTSAT 29 11/27/2017   Lab Results  Component Value Date   RETICCTPCT 4.51 (H) 05/30/2014   RBC 3.95 06/26/2018   RETICCTABS 137.10 (H) 05/30/2014   No results found for: KPAFRELGTCHN, LAMBDASER, KAPLAMBRATIO No results found for: Kandis Cocking, IGMSERUM No results found for: Odetta Pink, SPEI   Chemistry      Component Value Date/Time   NA 141 11/27/2017 1426   NA 145 12/03/2016 1134   NA 138 05/28/2016 1139   K 4.2 11/27/2017 1426   K 4.6 12/03/2016 1134   K 4.7 05/28/2016 1139   CL 104 11/27/2017 1426   CL 105 12/03/2016 1134   CO2 29 11/27/2017 1426   CO2 26 12/03/2016 1134   CO2 25 05/28/2016 1139   BUN 13 11/27/2017 1426   BUN 12 12/03/2016 1134   BUN 17.1 05/28/2016 1139   CREATININE 0.50 (L) 11/27/2017 1426   CREATININE 0.7 12/03/2016 1134   CREATININE 0.8 05/28/2016 1139      Component Value Date/Time   CALCIUM 9.6 11/27/2017 1426   CALCIUM 9.8 12/03/2016 1134   CALCIUM 9.5 05/28/2016 1139   ALKPHOS 47 11/27/2017 1426   ALKPHOS 51 12/03/2016 1134   ALKPHOS 44 05/28/2016 1139   AST 23 11/27/2017 1426   AST 16 05/28/2016 1139   ALT 26 11/27/2017 1426   ALT 39 12/03/2016 1134   ALT 23 05/28/2016 1139   BILITOT 0.6 11/27/2017 1426   BILITOT 0.35 05/28/2016 1139       Impression and Plan: [name] is a very pleasant 68 yo caucasian female with history of stage IIa  rectal cancer treated with chemo/radiation/surgery in 2016.   She continues to do well and has no complaints at this time.  CEA is pending.  We will go ahead and plan to see her back in another 6 months.  She will contact our office with any questions or concerns. We can certainly see her sooner if need be.   Laverna Peace, NP 5/22/20201:32 PM

## 2018-06-27 LAB — VITAMIN D 25 HYDROXY (VIT D DEFICIENCY, FRACTURES): Vit D, 25-Hydroxy: 21.1 ng/mL — ABNORMAL LOW (ref 30.0–100.0)

## 2018-06-30 LAB — IRON AND TIBC
Iron: 95 ug/dL (ref 41–142)
Saturation Ratios: 29 % (ref 21–57)
TIBC: 331 ug/dL (ref 236–444)
UIBC: 237 ug/dL (ref 120–384)

## 2018-06-30 LAB — FERRITIN: Ferritin: 22 ng/mL (ref 11–307)

## 2018-06-30 LAB — CEA (IN HOUSE-CHCC): CEA (CHCC-In House): <1 ng/mL (ref 0.00–5.00)

## 2018-06-30 LAB — LACTATE DEHYDROGENASE: LDH: 155 U/L (ref 98–192)

## 2018-07-01 ENCOUNTER — Other Ambulatory Visit: Payer: Self-pay | Admitting: Hematology & Oncology

## 2018-07-01 ENCOUNTER — Telehealth: Payer: Self-pay | Admitting: Family

## 2018-07-01 NOTE — Telephone Encounter (Signed)
Appointments scheduled calendar mailed

## 2018-07-02 ENCOUNTER — Other Ambulatory Visit: Payer: Self-pay | Admitting: Family

## 2018-07-02 ENCOUNTER — Encounter: Payer: Self-pay | Admitting: *Deleted

## 2018-07-02 DIAGNOSIS — E559 Vitamin D deficiency, unspecified: Secondary | ICD-10-CM

## 2018-07-02 MED ORDER — VITAMIN D (ERGOCALCIFEROL) 1.25 MG (50000 UNIT) PO CAPS: ORAL_CAPSULE | ORAL | 5 refills | Status: DC

## 2018-07-08 ENCOUNTER — Other Ambulatory Visit: Payer: Self-pay | Admitting: Family Medicine

## 2018-07-17 ENCOUNTER — Other Ambulatory Visit: Payer: Self-pay | Admitting: Family Medicine

## 2018-08-04 ENCOUNTER — Other Ambulatory Visit: Payer: Self-pay | Admitting: Family Medicine

## 2018-08-10 ENCOUNTER — Other Ambulatory Visit: Payer: Self-pay | Admitting: Hematology & Oncology

## 2018-08-13 ENCOUNTER — Ambulatory Visit: Payer: Medicare HMO | Admitting: Family Medicine

## 2018-08-14 ENCOUNTER — Other Ambulatory Visit: Payer: Self-pay | Admitting: Family Medicine

## 2018-08-17 ENCOUNTER — Other Ambulatory Visit: Payer: Self-pay | Admitting: Hematology & Oncology

## 2018-08-18 ENCOUNTER — Other Ambulatory Visit: Payer: Self-pay | Admitting: *Deleted

## 2018-08-20 ENCOUNTER — Other Ambulatory Visit: Payer: Self-pay | Admitting: Hematology & Oncology

## 2018-08-24 ENCOUNTER — Other Ambulatory Visit: Payer: Self-pay | Admitting: Hematology & Oncology

## 2018-08-24 DIAGNOSIS — K8689 Other specified diseases of pancreas: Secondary | ICD-10-CM

## 2018-08-24 DIAGNOSIS — C20 Malignant neoplasm of rectum: Secondary | ICD-10-CM

## 2018-09-04 ENCOUNTER — Other Ambulatory Visit: Payer: Self-pay

## 2018-09-04 ENCOUNTER — Encounter: Payer: Self-pay | Admitting: Family Medicine

## 2018-09-04 ENCOUNTER — Ambulatory Visit (INDEPENDENT_AMBULATORY_CARE_PROVIDER_SITE_OTHER): Payer: Medicare HMO | Admitting: Family Medicine

## 2018-09-04 VITALS — BP 142/78 | HR 67 | Temp 98.2°F | Ht 60.0 in | Wt 147.0 lb

## 2018-09-04 DIAGNOSIS — Z1239 Encounter for other screening for malignant neoplasm of breast: Secondary | ICD-10-CM

## 2018-09-04 DIAGNOSIS — E2839 Other primary ovarian failure: Secondary | ICD-10-CM

## 2018-09-04 DIAGNOSIS — E1159 Type 2 diabetes mellitus with other circulatory complications: Secondary | ICD-10-CM | POA: Diagnosis not present

## 2018-09-04 DIAGNOSIS — F418 Other specified anxiety disorders: Secondary | ICD-10-CM | POA: Diagnosis not present

## 2018-09-04 MED ORDER — ARIPIPRAZOLE 10 MG PO TABS: 10.0000 mg | ORAL_TABLET | Freq: Every day | ORAL | 2 refills | Status: DC

## 2018-09-04 NOTE — Patient Instructions (Addendum)
Someone will call you regarding your mammogram and bone density scan.  Consider making an appt with your counselor given the recent changes.  Aim to do some physical exertion for 150 minutes per week. This is typically divided into 5 days per week, 30 minutes per day. The activity should be enough to get your heart rate up. Anything is better than nothing if you have time constraints. Try walking outside. Being out of the house may help your mental state.   Let us know if you need anything.

## 2018-09-04 NOTE — Progress Notes (Signed)
Chief Complaint  Patient presents with  . Follow-up  . Depression    Subjective [name] presents for f/u anxiety/depression.  She is currently being treated with Abilify 10 mg/d.  Over past several months has been having worsening depression 2/2  No thoughts of harming self or others. No self-medication with alcohol, prescription drugs or illicit drugs. Pt is following with a counselor/psychologist. Has failed Zoloft, Seroquel; current med was more for insomnia s/s's.   Pt has hx of diabetes. Has not been checking sugars at home. Walks 1-2 times per day. Diet is fair.  She is compliant with her medication regimen: 88 units Lantus nightly, metformin 1000 mg twice daily.  ROS Psych: No homicidal or suicidal thoughts Cardiac: No CP  Past Medical History:  Diagnosis Date  .  Paroxysmal SVT (ANVRT)    S/p AV nodal ablation Dr. Lovena Le 09/28/08   . Anxiety   . CAD    Coronary disease status post non-ST elevation MI in May 2010 with  PCI of the left circumflex on Jun 24, 2008. She then had a PCI of  the RCA on July 15, 2008. 02/24/12 Cath, Severe 95% LAD, ostial 95% circ, ostial RCA, patent stents in distal RCA and mid circ normal LV 02/26/12 CABG with LIMA to LAD, SVG to RCA, and SVG to OM Dr. Roxan Hockey    . Cancer [facility])    adenocarcinoma of rectum  . Cataract   . Depression   . Diabetes mellitus, insulin dependent (IDDM), uncontrolled ([place])   . Heart murmur   . Hyperlipidemia   . Hypertension   . Hypertensive heart disease   . S/P radiation therapy 04/11/14-05/18/14   50.4Gy rectal  . Vertigo    Result of chemotherapy; has been to ENT and told nothing to be done     Exam BP (!) 142/78 (BP Location: Left Arm, Patient Position: Sitting, Cuff Size: Normal)   Pulse 67   Temp 98.2 F (36.8 C) (Oral)   Ht 5' (1.524 m)   Wt 147 lb (66.7 kg)   SpO2 95%   BMI 28.71 kg/m  General:  well developed, well nourished, in no apparent distress Neck: neck supple without adenopathy,  thyromegaly, or masses Lungs:  clear to auscultation, breath sounds equal bilaterally, no respiratory distress Cardio:  regular rate and rhythm without murmurs, heart sounds without clicks or rubs Psych: well oriented with normal range of affect and age-appropriate judgement/insight, alert and oriented x4.  Assessment and Plan  Type 2 diabetes mellitus with vascular disease ([place]) - Plan: Comprehensive metabolic panel, Lipid panel, Hemoglobin A1c, Microalbumin / creatinine urine ratio  Screening breast examination - Plan: MM DIGITAL SCREENING BILATERAL  Estrogen deficiency - Plan: DG Bone Density  Anxiety with depression - Plan: Uncontrolled; increase dose of Abilify from 5 mg daily to 10 mg daily at her request.  I encouraged her to get out of the house for walks.  Encouraged her to contact her counselor again.  Orders as above. F/u in 1 mo to recheck depression, will consider adding bupropion or serotonin modulator if no improvement.. The patient voiced understanding and agreement to the plan.  [place], DO 09/04/18 11:55 AM

## 2018-09-16 ENCOUNTER — Other Ambulatory Visit: Payer: Self-pay | Admitting: *Deleted

## 2018-09-16 MED ORDER — METHENAMINE HIPPURATE 1 G PO TABS: ORAL_TABLET | ORAL | 0 refills | Status: DC

## 2018-10-02 ENCOUNTER — Other Ambulatory Visit: Payer: Self-pay

## 2018-10-02 ENCOUNTER — Encounter: Payer: Self-pay | Admitting: Family Medicine

## 2018-10-02 ENCOUNTER — Ambulatory Visit (INDEPENDENT_AMBULATORY_CARE_PROVIDER_SITE_OTHER): Payer: Medicare HMO | Admitting: Family Medicine

## 2018-10-02 DIAGNOSIS — F418 Other specified anxiety disorders: Secondary | ICD-10-CM

## 2018-10-02 MED ORDER — ARIPIPRAZOLE 10 MG PO TABS: 10.0000 mg | ORAL_TABLET | Freq: Every day | ORAL | 2 refills | Status: DC

## 2018-10-02 NOTE — Progress Notes (Signed)
Chief Complaint  Patient presents with  . Depression    recheck--Abilify working well    Subjective [name] presents for f/u anxiety/depression. Due to COVID-19 pandemic, we are interacting via web portal for an electronic face-to-face visit. I verified patient's ID using 2 identifiers. Patient agreed to proceed with visit via this method. Patient is at home, I am at office. Patient and I are present for visit.   She is currently being treated with Abilify 10 mg daily, recently increased from 5 mg daily, and sertraline 50 mg daily.  Reports significant improvement since treatment. No thoughts of harming self or others. Walking outside more. Pt is not following with a counselor/psychologist.  ROS Psych: No homicidal or suicidal thoughts  Past Medical History:  Diagnosis Date  .  Paroxysmal SVT (ANVRT)    S/p AV nodal ablation Dr. Lovena Le 09/28/08   . Anxiety   . CAD    Coronary disease status post non-ST elevation MI in May 2010 with  PCI of the left circumflex on Jun 24, 2008. She then had a PCI of  the RCA on July 15, 2008. 02/24/12 Cath, Severe 95% LAD, ostial 95% circ, ostial RCA, patent stents in distal RCA and mid circ normal LV 02/26/12 CABG with LIMA to LAD, SVG to RCA, and SVG to OM Dr. Roxan Hockey    . Cancer [facility])    adenocarcinoma of rectum  . Cataract   . Depression   . Diabetes mellitus, insulin dependent (IDDM), uncontrolled ([place])   . Heart murmur   . Hyperlipidemia   . Hypertension   . Hypertensive heart disease   . S/P radiation therapy 04/11/14-05/18/14   50.4Gy rectal  . Vertigo    Result of chemotherapy; has been to ENT and told nothing to be done   Exam No conversational dyspnea Age appropriate judgment and insight Nml affect and mood  Assessment and Plan  Anxiety with depression  Total time: 6 min Needs labs, will f/u pending those results. The patient voiced understanding and agreement to the plan.  [place], DO 10/02/18 11:37  AM

## 2018-10-08 DIAGNOSIS — R399 Unspecified symptoms and signs involving the genitourinary system: Secondary | ICD-10-CM | POA: Diagnosis not present

## 2018-10-20 ENCOUNTER — Other Ambulatory Visit: Payer: Self-pay | Admitting: Family Medicine

## 2018-10-27 ENCOUNTER — Other Ambulatory Visit: Payer: Self-pay | Admitting: Hematology & Oncology

## 2018-11-02 ENCOUNTER — Other Ambulatory Visit: Payer: Self-pay | Admitting: Family Medicine

## 2018-11-03 ENCOUNTER — Other Ambulatory Visit: Payer: Self-pay | Admitting: Family Medicine

## 2018-11-10 ENCOUNTER — Other Ambulatory Visit: Payer: Self-pay | Admitting: Hematology & Oncology

## 2018-11-10 DIAGNOSIS — K8689 Other specified diseases of pancreas: Secondary | ICD-10-CM

## 2018-11-10 DIAGNOSIS — C20 Malignant neoplasm of rectum: Secondary | ICD-10-CM

## 2018-11-17 ENCOUNTER — Other Ambulatory Visit: Payer: Self-pay | Admitting: Family Medicine

## 2018-11-17 MED ORDER — METHENAMINE HIPPURATE 1 G PO TABS: 1.0000 g | ORAL_TABLET | Freq: Two times a day (BID) | ORAL | 0 refills | Status: DC

## 2018-11-17 MED ORDER — METFORMIN HCL 1000 MG PO TABS: ORAL_TABLET | ORAL | 3 refills | Status: DC

## 2018-11-17 MED ORDER — SERTRALINE HCL 50 MG PO TABS: ORAL_TABLET | ORAL | 1 refills | Status: DC

## 2018-12-03 ENCOUNTER — Other Ambulatory Visit: Payer: Self-pay | Admitting: Hematology & Oncology

## 2018-12-14 ENCOUNTER — Ambulatory Visit (HOSPITAL_BASED_OUTPATIENT_CLINIC_OR_DEPARTMENT_OTHER): Payer: Medicare HMO

## 2018-12-14 ENCOUNTER — Other Ambulatory Visit (HOSPITAL_BASED_OUTPATIENT_CLINIC_OR_DEPARTMENT_OTHER): Payer: Medicare HMO

## 2018-12-21 ENCOUNTER — Telehealth: Payer: Self-pay | Admitting: Family Medicine

## 2018-12-21 NOTE — Telephone Encounter (Signed)
Copied from [place] [id number]. Topic: General - Other >> Dec 21, 2018  1:50 PM [name] wrote: Reason for CRM: Patient called to ask the nurse or doctor to removed the medication, fesoterodine (TOVIAZ) 4 MG TB24 tablet.  She stated that she is not taking that medication any longer and it should not be filled.  Please advise and call to discuss at [phone]

## 2018-12-21 NOTE — Telephone Encounter (Signed)
OK 

## 2018-12-21 NOTE — Telephone Encounter (Signed)
I have called the patient regarding this medication.. She stated she took the medication for a while and it did help the symptoms she was having.  But recently stopped taking and has been fine.  She would like the medication removed from her list if ok.. Told her I would let PCP be aware of this.

## 2018-12-24 ENCOUNTER — Telehealth: Payer: Self-pay | Admitting: Family Medicine

## 2018-12-24 NOTE — Telephone Encounter (Signed)
Copied from [place] [phone]. Topic: General - Other >> Dec 24, 2018 12:03 PM [name] wrote: Reason for CRM: Patient would like the nurse to call her regarding her medication for ARIPiprazole (ABILIFY) 10 MG tablet.  She stated she would like to increase the dosage.  Please advise and call patient back at [phone]

## 2018-12-25 ENCOUNTER — Other Ambulatory Visit: Payer: Medicare HMO

## 2018-12-25 ENCOUNTER — Ambulatory Visit: Payer: Medicare HMO | Admitting: Hematology & Oncology

## 2018-12-25 NOTE — Telephone Encounter (Signed)
Called the patient---(left a detailed message to schedule a virtual appt.)

## 2018-12-25 NOTE — Telephone Encounter (Signed)
Patient still has not heard from the doctor regarding increasing her medication.  Please cal her back as soon as possible.

## 2018-12-25 NOTE — Telephone Encounter (Signed)
Called left her a detailed message that PCP needs her to schedule a Virtual Visit in order to discuss medication change, and to please give [place] a call to schedule an appointment

## 2018-12-25 NOTE — Telephone Encounter (Signed)
Appt. Scheduled for Monday 12/28/2018

## 2018-12-25 NOTE — Telephone Encounter (Signed)
Appt plz. Can be virtual. Ty.

## 2018-12-28 ENCOUNTER — Other Ambulatory Visit: Payer: Self-pay

## 2018-12-28 ENCOUNTER — Ambulatory Visit (INDEPENDENT_AMBULATORY_CARE_PROVIDER_SITE_OTHER): Payer: Medicare HMO | Admitting: Family Medicine

## 2018-12-28 ENCOUNTER — Encounter: Payer: Self-pay | Admitting: Family Medicine

## 2018-12-28 ENCOUNTER — Other Ambulatory Visit: Payer: Self-pay | Admitting: Family Medicine

## 2018-12-28 DIAGNOSIS — F418 Other specified anxiety disorders: Secondary | ICD-10-CM | POA: Diagnosis not present

## 2018-12-28 MED ORDER — SERTRALINE HCL 100 MG PO TABS: 100.0000 mg | ORAL_TABLET | Freq: Every day | ORAL | 3 refills | Status: DC

## 2018-12-28 NOTE — Progress Notes (Signed)
Chief Complaint  Patient presents with  . Medication Problem    Subjective [name] presents for f/u anxiety/depression. Due to COVID-19 pandemic, we are interacting via telephone. I verified patient's ID using 2 identifiers. Patient agreed to proceed with visit via this method. Patient is at home, I am at office. Patient and I are present for visit.   She is currently being treated with Abilify 10 mg/d, Zoloft 50 mg/d.  Had been doing well, got worse over past 3 weeks w pandemic worsening and fewer things to do.  No thoughts of harming self or others. No self-medication with alcohol, prescription drugs or illicit drugs.  ROS Psych: No homicidal or suicidal thoughts  Past Medical History:  Diagnosis Date  .  Paroxysmal SVT (ANVRT)    S/p AV nodal ablation Dr. Lovena Le 09/28/08   . Anxiety   . CAD    Coronary disease status post non-ST elevation MI in May 2010 with  PCI of the left circumflex on Jun 24, 2008. She then had a PCI of  the RCA on July 15, 2008. 02/24/12 Cath, Severe 95% LAD, ostial 95% circ, ostial RCA, patent stents in distal RCA and mid circ normal LV 02/26/12 CABG with LIMA to LAD, SVG to RCA, and SVG to OM Dr. Roxan Hockey    . Cancer [facility])    adenocarcinoma of rectum  . Cataract   . Depression   . Diabetes mellitus, insulin dependent (IDDM), uncontrolled ([place])   . Heart murmur   . Hyperlipidemia   . Hypertension   . Hypertensive heart disease   . S/P radiation therapy 04/11/14-05/18/14   50.4Gy rectal  . Vertigo    Result of chemotherapy; has been to ENT and told nothing to be done     Exam No conversational dyspnea Age appropriate judgment and insight Nml affect and mood   Assessment and Plan  Anxiety with depression - Plan: sertraline (ZOLOFT) 100 MG tablet  Cont Abilify. Increase dosage from 50 mg/d to 100 mg/d of Zoloft.  Get outside and start walking again, or just be in sunlight.  Total time: 12 min F/u in 3 weeks. The patient voiced  understanding and agreement to the plan.  [place], DO 12/28/18 12:59 PM

## 2019-01-05 ENCOUNTER — Telehealth: Payer: Self-pay | Admitting: *Deleted

## 2019-01-05 NOTE — Telephone Encounter (Signed)
Copied from [place] [id number]. Topic: General - Other >> Jan 05, 2019  2:16 PM Celene Kras wrote: Reason for CRM: Pt called stating she had a reaction to her medication that was recently changed. Pt states she got way too hot after taking medication. Please advise.

## 2019-01-05 NOTE — Telephone Encounter (Signed)
Called left message to call back 

## 2019-01-06 ENCOUNTER — Encounter: Payer: Self-pay | Admitting: Family Medicine

## 2019-01-06 ENCOUNTER — Ambulatory Visit (INDEPENDENT_AMBULATORY_CARE_PROVIDER_SITE_OTHER): Payer: Medicare HMO | Admitting: Family Medicine

## 2019-01-06 ENCOUNTER — Other Ambulatory Visit: Payer: Self-pay

## 2019-01-06 DIAGNOSIS — F418 Other specified anxiety disorders: Secondary | ICD-10-CM | POA: Diagnosis not present

## 2019-01-06 MED ORDER — ESCITALOPRAM OXALATE 10 MG PO TABS: 10.0000 mg | ORAL_TABLET | Freq: Every day | ORAL | 2 refills | Status: DC

## 2019-01-06 NOTE — Progress Notes (Signed)
CC: F/u  Subjective [name] presents for f/u anxiety/depression. Due to COVID-19 pandemic, we are interacting via telephone. I verified patient's ID using 2 identifiers. Patient agreed to proceed with visit via this method. Patient is at home, I am at office. Patient and I are present for visit.   She is currently being treated with Abilify 10 mg/d, Zoloft 100 mg/d.  Reports side effects since treatment, went back to Zoloft 50 mg/d. No thoughts of harming self or others. No self-medication with alcohol, prescription drugs or illicit drugs. Pt is not following with a counselor/psychologist.  ROS Psych: No homicidal or suicidal thoughts  Past Medical History:  Diagnosis Date  .  Paroxysmal SVT (ANVRT)    S/p AV nodal ablation Dr. Lovena Le 09/28/08   . Anxiety   . CAD    Coronary disease status post non-ST elevation MI in May 2010 with  PCI of the left circumflex on Jun 24, 2008. She then had a PCI of  the RCA on July 15, 2008. 02/24/12 Cath, Severe 95% LAD, ostial 95% circ, ostial RCA, patent stents in distal RCA and mid circ normal LV 02/26/12 CABG with LIMA to LAD, SVG to RCA, and SVG to OM Dr. Roxan Hockey    . Cancer [facility])    adenocarcinoma of rectum  . Cataract   . Depression   . Diabetes mellitus, insulin dependent (IDDM), uncontrolled   . Heart murmur   . Hyperlipidemia   . Hypertension   . Hypertensive heart disease   . S/P radiation therapy 04/11/14-05/18/14   50.4Gy rectal  . Vertigo    Result of chemotherapy; has been to ENT and told nothing to be done     Exam No conversational dyspnea Age appropriate judgment and insight Nml affect and mood  Assessment and Plan  Anxiety with depression - Plan: escitalopram (LEXAPRO) 10 MG tablet  Change Zoloft to Lexapro.  Total time spent: 11 min F/u in 3 weeks. The patient voiced understanding and agreement to the plan.  [place], DO 01/06/19 3:27 PM

## 2019-01-06 NOTE — Telephone Encounter (Signed)
Scheduled telephone visit today 01/06/2019

## 2019-01-06 NOTE — Telephone Encounter (Signed)
Called left message to call back 

## 2019-01-18 ENCOUNTER — Other Ambulatory Visit: Payer: Self-pay | Admitting: Family Medicine

## 2019-01-18 MED ORDER — METOPROLOL TARTRATE 25 MG PO TABS: 25.0000 mg | ORAL_TABLET | Freq: Two times a day (BID) | ORAL | 2 refills | Status: DC

## 2019-01-19 ENCOUNTER — Other Ambulatory Visit: Payer: Self-pay | Admitting: Family Medicine

## 2019-01-19 MED ORDER — METHENAMINE HIPPURATE 1 G PO TABS: ORAL_TABLET | ORAL | 0 refills | Status: DC

## 2019-02-02 ENCOUNTER — Other Ambulatory Visit: Payer: Self-pay | Admitting: *Deleted

## 2019-02-03 ENCOUNTER — Other Ambulatory Visit: Payer: Self-pay

## 2019-02-03 ENCOUNTER — Telehealth: Payer: Self-pay | Admitting: *Deleted

## 2019-02-03 DIAGNOSIS — C20 Malignant neoplasm of rectum: Secondary | ICD-10-CM

## 2019-02-03 DIAGNOSIS — K8689 Other specified diseases of pancreas: Secondary | ICD-10-CM

## 2019-02-03 MED ORDER — DIPHENOXYLATE-ATROPINE 2.5-0.025 MG PO TABS: ORAL_TABLET | ORAL | 0 refills | Status: DC

## 2019-02-03 MED ORDER — CREON 24000-76000 UNITS PO CPEP: ORAL_CAPSULE | ORAL | 0 refills | Status: DC

## 2019-02-03 NOTE — Telephone Encounter (Signed)
Called to schedule E visit for Monday//left detailed message to call back.

## 2019-02-03 NOTE — Telephone Encounter (Signed)
Copied from [place] [id number]. Topic: General - Other >> Feb 03, 2019  2:31 PM Leward Quan A wrote: Reason for CRM: Pateint called to inquire on what day Dr Nani Ravens will be calling to check her progress on new medication started on 01/06/2019. Also states that she is doing well right now but can be reached at Ph#  [phone]

## 2019-02-03 NOTE — Telephone Encounter (Signed)
OK to sched E visit next week at her convenience. Ty.

## 2019-02-08 NOTE — Telephone Encounter (Signed)
Called left detailed message to schedule appointment

## 2019-02-09 ENCOUNTER — Encounter: Payer: Self-pay | Admitting: Family Medicine

## 2019-02-09 ENCOUNTER — Ambulatory Visit (INDEPENDENT_AMBULATORY_CARE_PROVIDER_SITE_OTHER): Payer: Medicare HMO | Admitting: Family Medicine

## 2019-02-09 ENCOUNTER — Other Ambulatory Visit: Payer: Self-pay

## 2019-02-09 ENCOUNTER — Ambulatory Visit: Payer: Medicare HMO | Admitting: Family Medicine

## 2019-02-09 DIAGNOSIS — F418 Other specified anxiety disorders: Secondary | ICD-10-CM

## 2019-02-09 NOTE — Progress Notes (Signed)
Chief Complaint  Patient presents with  . Follow-up    medication    Subjective [name] presents for f/u anxiety/depression. Due to COVID-19 pandemic, we are interacting via web portal for an electronic face-to-face visit. I verified patient's ID using 2 identifiers. Patient agreed to proceed with visit via this method. Patient is at home, I am at office. Patient and I are present for visit.   She is currently being treated with Abilify and Lexapro 10 mg/d. Was just changed to latter medication from Zoloft.   Reports excellent improvement since treatment. No thoughts of harming self or others. No self-medication with alcohol, prescription drugs or illicit drugs. Pt is not following with a counselor/psychologist.  ROS Psych: No homicidal or suicidal thoughts  Past Medical History:  Diagnosis Date  .  Paroxysmal SVT (ANVRT)    S/p AV nodal ablation Dr. Lovena Le 09/28/08   . Anxiety   . CAD    Coronary disease status post non-ST elevation MI in May 2010 with  PCI of the left circumflex on Jun 24, 2008. She then had a PCI of  the RCA on July 15, 2008. 02/24/12 Cath, Severe 95% LAD, ostial 95% circ, ostial RCA, patent stents in distal RCA and mid circ normal LV 02/26/12 CABG with LIMA to LAD, SVG to RCA, and SVG to OM Dr. Roxan Hockey    . Cancer [facility])    adenocarcinoma of rectum  . Cataract   . Depression   . Diabetes mellitus, insulin dependent (IDDM), uncontrolled   . Heart murmur   . Hyperlipidemia   . Hypertension   . Hypertensive heart disease   . S/P radiation therapy 04/11/14-05/18/14   50.4Gy rectal  . Vertigo    Result of chemotherapy; has been to ENT and told nothing to be done   Exam No conversational dyspnea Age appropriate judgment and insight Nml affect and mood  Assessment and Plan  Anxiety with depression  Cont Abilify and Lexapro.  F/u in 3 mo for CPE and then every 6 mo afterwards. The patient voiced understanding and agreement to the plan.  [place], DO 02/09/19 11:37 AM

## 2019-02-15 ENCOUNTER — Other Ambulatory Visit: Payer: Self-pay | Admitting: Family Medicine

## 2019-02-15 MED ORDER — METFORMIN HCL 1000 MG PO TABS: ORAL_TABLET | ORAL | 4 refills | Status: DC

## 2019-02-19 ENCOUNTER — Other Ambulatory Visit: Payer: Self-pay | Admitting: Family Medicine

## 2019-02-19 MED ORDER — METHENAMINE HIPPURATE 1 G PO TABS: ORAL_TABLET | ORAL | 0 refills | Status: DC

## 2019-03-17 ENCOUNTER — Other Ambulatory Visit: Payer: Self-pay | Admitting: Family Medicine

## 2019-03-17 MED ORDER — ATORVASTATIN CALCIUM 10 MG PO TABS: ORAL_TABLET | ORAL | 3 refills | Status: DC

## 2019-03-22 ENCOUNTER — Other Ambulatory Visit: Payer: Self-pay | Admitting: Family Medicine

## 2019-03-22 MED ORDER — METHENAMINE HIPPURATE 1 G PO TABS: ORAL_TABLET | ORAL | 1 refills | Status: DC

## 2019-03-24 ENCOUNTER — Ambulatory Visit: Payer: Medicare HMO | Admitting: Hematology & Oncology

## 2019-03-24 ENCOUNTER — Other Ambulatory Visit: Payer: Medicare HMO

## 2019-03-27 ENCOUNTER — Other Ambulatory Visit: Payer: Self-pay | Admitting: Family

## 2019-03-27 ENCOUNTER — Other Ambulatory Visit: Payer: Self-pay | Admitting: Hematology & Oncology

## 2019-03-27 DIAGNOSIS — K8689 Other specified diseases of pancreas: Secondary | ICD-10-CM

## 2019-03-27 DIAGNOSIS — C20 Malignant neoplasm of rectum: Secondary | ICD-10-CM

## 2019-03-29 ENCOUNTER — Other Ambulatory Visit: Payer: Self-pay | Admitting: Family Medicine

## 2019-03-29 DIAGNOSIS — F418 Other specified anxiety disorders: Secondary | ICD-10-CM

## 2019-03-29 MED ORDER — ESCITALOPRAM OXALATE 10 MG PO TABS: 10.0000 mg | ORAL_TABLET | Freq: Every day | ORAL | 2 refills | Status: DC

## 2019-04-01 ENCOUNTER — Inpatient Hospital Stay: Payer: Medicare HMO | Attending: Family Medicine

## 2019-04-01 ENCOUNTER — Ambulatory Visit: Payer: Medicare HMO | Admitting: Hematology & Oncology

## 2019-04-20 ENCOUNTER — Telehealth: Payer: Self-pay | Admitting: Family Medicine

## 2019-04-20 DIAGNOSIS — E162 Hypoglycemia, unspecified: Secondary | ICD-10-CM | POA: Diagnosis not present

## 2019-04-20 DIAGNOSIS — E161 Other hypoglycemia: Secondary | ICD-10-CM | POA: Diagnosis not present

## 2019-04-20 DIAGNOSIS — I10 Essential (primary) hypertension: Secondary | ICD-10-CM | POA: Diagnosis not present

## 2019-04-20 DIAGNOSIS — R42 Dizziness and giddiness: Secondary | ICD-10-CM | POA: Diagnosis not present

## 2019-04-20 NOTE — Telephone Encounter (Signed)
Sched appt plz. Ty. 

## 2019-04-20 NOTE — Telephone Encounter (Signed)
The callback number belongs to pt's sister, Bonner Puna.  Pt is staying with Ms. Wynetta Emery.

## 2019-04-20 NOTE — Telephone Encounter (Signed)
Called the patient and she agreed to schedule appointment tomorrow 04/21/2019 at 2:15.

## 2019-04-20 NOTE — Telephone Encounter (Signed)
Caller: Icie Smathers  Callback Number: [phone]  Patient states that she passed out this morning, EMS were called,Patient sugar was low. EMS got sugar stable, Patient is experiencing diarrhea.   Please advise

## 2019-04-20 NOTE — Telephone Encounter (Signed)
Advise

## 2019-04-21 ENCOUNTER — Other Ambulatory Visit: Payer: Self-pay

## 2019-04-21 ENCOUNTER — Ambulatory Visit (INDEPENDENT_AMBULATORY_CARE_PROVIDER_SITE_OTHER): Payer: Medicare HMO | Admitting: Family Medicine

## 2019-04-21 ENCOUNTER — Encounter: Payer: Self-pay | Admitting: Family Medicine

## 2019-04-21 VITALS — BP 130/64 | HR 60 | Temp 97.2°F | Ht 60.0 in | Wt 144.5 lb

## 2019-04-21 DIAGNOSIS — R197 Diarrhea, unspecified: Secondary | ICD-10-CM

## 2019-04-21 DIAGNOSIS — E162 Hypoglycemia, unspecified: Secondary | ICD-10-CM

## 2019-04-21 DIAGNOSIS — G8929 Other chronic pain: Secondary | ICD-10-CM | POA: Insufficient documentation

## 2019-04-21 DIAGNOSIS — M533 Sacrococcygeal disorders, not elsewhere classified: Secondary | ICD-10-CM | POA: Diagnosis not present

## 2019-04-21 NOTE — Progress Notes (Signed)
Subjective:   Chief Complaint  Patient presents with  . Diarrhea    BS 84    [name] is a 69 y.o. female here for follow-up of diabetes.  Here w sister who helps w hx.   Yesterday had sugar in the 80's, felt poorly and called EMS. Had diarrhea over past 2 weeks, stopped eating as a result.  Since she has not been eating well, her blood sugars have been low.  She stopped taking Metformin and has not taken her insulin since this happened.  She is normally on 88 units of Lantus nightly.  She continues to have loose stools but has not had a bowel movement since yesterday.  Unsure if it is getting more normal.  No blood, abdominal pain, nausea, vomiting, or fevers.  There are no sick contacts and recent travel.  She has not changed her medications recently.  Reports right hip pain for several months.  No injury or change in activity.  Takes ibuprofen sometimes helps.  She denies any bruising, redness, or swelling.  Has never been evaluated for this before.  Past Medical History:  Diagnosis Date  .  Paroxysmal SVT (ANVRT)    S/p AV nodal ablation Dr. Lovena Le 09/28/08   . Anxiety   . CAD    Coronary disease status post non-ST elevation MI in May 2010 with  PCI of the left circumflex on Jun 24, 2008. She then had a PCI of  the RCA on July 15, 2008. 02/24/12 Cath, Severe 95% LAD, ostial 95% circ, ostial RCA, patent stents in distal RCA and mid circ normal LV 02/26/12 CABG with LIMA to LAD, SVG to RCA, and SVG to OM Dr. Roxan Hockey    . Cancer [facility])    adenocarcinoma of rectum  . Cataract   . Depression   . Diabetes mellitus, insulin dependent (IDDM), uncontrolled   . Heart murmur   . Hyperlipidemia   . Hypertension   . Hypertensive heart disease   . S/P radiation therapy 04/11/14-05/18/14   50.4Gy rectal  . Vertigo    Result of chemotherapy; has been to ENT and told nothing to be done    Objective:  BP 130/64 (BP Location: Left Arm, Patient Position: Sitting, Cuff Size: Normal)   Pulse 60    Temp (!) 97.2 F (36.2 C) (Temporal)   Ht 5' (1.524 m)   Wt 144 lb 8 oz (65.5 kg)   SpO2 94%   BMI 28.22 kg/m  General:  Well developed, well nourished, in no apparent distress Skin:  Warm, no pallor or diaphoresis Head:  Normocephalic, atraumatic Eyes:  Pupils equal and round, sclera anicteric without injection  Lungs:  CTAB, no access msc use Cardio:  RRR, no bruits, no LE edema Musculoskeletal:  + TTP over the right SI joint; there is no midline tenderness or deformity; negative FABER/FADDIR, log roll, [place] Neuro: No cerebellar signs Psych: Age appropriate judgment and insight  Assessment:   Hypoglycemia  Chronic right SI joint pain  Diarrhea, unspecified type - Plan: Ova and parasite examination, Stool Culture   Plan:   Okay to hold her medicines.  I think she will steadily get better with the diarrhea and will be able to eat normally after that.  Metamucil in the meantime.  Check stool culture.  She will add back her insulin when she is eating normally.  I would like to see her again for this early next week. Stretches and exercises given to the SI joint pain.  If  no improvement, will inject.  Consider physical therapy. The patient and her sister voiced understanding and agreement to the plan.  [place], DO 04/21/19 4:42 PM

## 2019-04-21 NOTE — Patient Instructions (Addendum)
OK to hold the insulin until you are eating normally again.   Stop metformin for now.   Take metamucil.   We are going to check your stool.   Heat (pad or rice pillow in microwave) over affected area, 10-15 minutes twice daily.   Ice/cold pack over area for 10-15 min twice daily.  OK to take Tylenol 1000 mg (2 extra strength tabs) or 975 mg (3 regular strength tabs) every 6 hours as needed.  EXERCISES  RANGE OF MOTION (ROM) AND STRETCHING EXERCISES - Low Back Pain Most people with lower back pain will find that their symptoms get worse with excessive bending forward (flexion) or arching at the lower back (extension). The exercises that will help resolve your symptoms will focus on the opposite motion.  If you have pain, numbness or tingling which travels down into your buttocks, leg or foot, the goal of the therapy is for these symptoms to move closer to your back and eventually resolve. Sometimes, these leg symptoms will get better, but your lower back pain may worsen. This is often an indication of progress in your rehabilitation. Be very alert to any changes in your symptoms and the activities in which you participated in the 24 hours prior to the change. Sharing this information with your caregiver will allow him or her to most efficiently treat your condition. These exercises may help you when beginning to rehabilitate your injury. Your symptoms may resolve with or without further involvement from your physician, physical therapist or athletic trainer. While completing these exercises, remember:   Restoring tissue flexibility helps normal motion to return to the joints. This allows healthier, less painful movement and activity.  An effective stretch should be held for at least 30 seconds.  A stretch should never be painful. You should only feel a gentle lengthening or release in the stretched tissue. FLEXION RANGE OF MOTION AND STRETCHING EXERCISES:  STRETCH - Flexion, Single Knee to  Chest   Lie on a firm bed or floor with both legs extended in front of you.  Keeping one leg in contact with the floor, bring your opposite knee to your chest. Hold your leg in place by either grabbing behind your thigh or at your knee.  Pull until you feel a gentle stretch in your low back. Hold 30 seconds.  Slowly release your grasp and repeat the exercise with the opposite side. Repeat 2 times. Complete this exercise 3 times per week.   STRETCH - Flexion, Double Knee to Chest  Lie on a firm bed or floor with both legs extended in front of you.  Keeping one leg in contact with the floor, bring your opposite knee to your chest.  Tense your stomach muscles to support your back and then lift your other knee to your chest. Hold your legs in place by either grabbing behind your thighs or at your knees.  Pull both knees toward your chest until you feel a gentle stretch in your low back. Hold 30 seconds.  Tense your stomach muscles and slowly return one leg at a time to the floor. Repeat 2 times. Complete this exercise 3 times per week.   STRETCH - Low Trunk Rotation  Lie on a firm bed or floor. Keeping your legs in front of you, bend your knees so they are both pointed toward the ceiling and your feet are flat on the floor.  Extend your arms out to the side. This will stabilize your upper body by keeping your shoulders  in contact with the floor.  Gently and slowly drop both knees together to one side until you feel a gentle stretch in your low back. Hold for 30 seconds.  Tense your stomach muscles to support your lower back as you bring your knees back to the starting position. Repeat the exercise to the other side. Repeat 2 times. Complete this exercise at least 3 times per week.   EXTENSION RANGE OF MOTION AND FLEXIBILITY EXERCISES:  STRETCH - Extension, Prone on Elbows   Lie on your stomach on the floor, a bed will be too soft. Place your palms about shoulder width apart and at  the height of your head.  Place your elbows under your shoulders. If this is too painful, stack pillows under your chest.  Allow your body to relax so that your hips drop lower and make contact more completely with the floor.  Hold this position for 30 seconds.  Slowly return to lying flat on the floor. Repeat 2 times. Complete this exercise 3 times per week.   RANGE OF MOTION - Extension, Prone Press Ups  Lie on your stomach on the floor, a bed will be too soft. Place your palms about shoulder width apart and at the height of your head.  Keeping your back as relaxed as possible, slowly straighten your elbows while keeping your hips on the floor. You may adjust the placement of your hands to maximize your comfort. As you gain motion, your hands will come more underneath your shoulders.  Hold this position 30 seconds.  Slowly return to lying flat on the floor. Repeat 2 times. Complete this exercise 3 times per week.   RANGE OF MOTION- Quadruped, Neutral Spine   Assume a hands and knees position on a firm surface. Keep your hands under your shoulders and your knees under your hips. You may place padding under your knees for comfort.  Drop your head and point your tailbone toward the ground below you. This will round out your lower back like an angry cat. Hold this position for 30 seconds.  Slowly lift your head and release your tail bone so that your back sags into a large arch, like an old horse.  Hold this position for 30 seconds.  Repeat this until you feel limber in your low back.  Now, find your "sweet spot." This will be the most comfortable position somewhere between the two previous positions. This is your neutral spine. Once you have found this position, tense your stomach muscles to support your low back.  Hold this position for 30 seconds. Repeat 2 times. Complete this exercise 3 times per week.   STRENGTHENING EXERCISES - Low Back Sprain These exercises may help you  when beginning to rehabilitate your injury. These exercises should be done near your "sweet spot." This is the neutral, low-back arch, somewhere between fully rounded and fully arched, that is your least painful position. When performed in this safe range of motion, these exercises can be used for people who have either a flexion or extension based injury. These exercises may resolve your symptoms with or without further involvement from your physician, physical therapist or athletic trainer. While completing these exercises, remember:   Muscles can gain both the endurance and the strength needed for everyday activities through controlled exercises.  Complete these exercises as instructed by your physician, physical therapist or athletic trainer. Increase the resistance and repetitions only as guided.  You may experience muscle soreness or fatigue, but the pain or  discomfort you are trying to eliminate should never worsen during these exercises. If this pain does worsen, stop and make certain you are following the directions exactly. If the pain is still present after adjustments, discontinue the exercise until you can discuss the trouble with your caregiver.  STRENGTHENING - Deep Abdominals, Pelvic Tilt   Lie on a firm bed or floor. Keeping your legs in front of you, bend your knees so they are both pointed toward the ceiling and your feet are flat on the floor.  Tense your lower abdominal muscles to press your low back into the floor. This motion will rotate your pelvis so that your tail bone is scooping upwards rather than pointing at your feet or into the floor. With a gentle tension and even breathing, hold this position for 3 seconds. Repeat 2 times. Complete this exercise 3 times per week.   STRENGTHENING - Abdominals, Crunches   Lie on a firm bed or floor. Keeping your legs in front of you, bend your knees so they are both pointed toward the ceiling and your feet are flat on the floor.  Cross your arms over your chest.  Slightly tip your chin down without bending your neck.  Tense your abdominals and slowly lift your trunk high enough to just clear your shoulder blades. Lifting higher can put excessive stress on the lower back and does not further strengthen your abdominal muscles.  Control your return to the starting position. Repeat 2 times. Complete this exercise 3 times per week.   STRENGTHENING - Quadruped, Opposite UE/LE Lift   Assume a hands and knees position on a firm surface. Keep your hands under your shoulders and your knees under your hips. You may place padding under your knees for comfort.  Find your neutral spine and gently tense your abdominal muscles so that you can maintain this position. Your shoulders and hips should form a rectangle that is parallel with the floor and is not twisted.  Keeping your trunk steady, lift your right hand no higher than your shoulder and then your left leg no higher than your hip. Make sure you are not holding your breath. Hold this position for 30 seconds.  Continuing to keep your abdominal muscles tense and your back steady, slowly return to your starting position. Repeat with the opposite arm and leg. Repeat 2 times. Complete this exercise 3 times per week.   STRENGTHENING - Abdominals and Quadriceps, Straight Leg Raise   Lie on a firm bed or floor with both legs extended in front of you.  Keeping one leg in contact with the floor, bend the other knee so that your foot can rest flat on the floor.  Find your neutral spine, and tense your abdominal muscles to maintain your spinal position throughout the exercise.  Slowly lift your straight leg off the floor about 6 inches for a count of 3, making sure to not hold your breath.  Still keeping your neutral spine, slowly lower your leg all the way to the floor. Repeat this exercise with each leg 2 times. Complete this exercise 3 times per week.  POSTURE AND BODY  MECHANICS CONSIDERATIONS - Low Back Sprain Keeping correct posture when sitting, standing or completing your activities will reduce the stress put on different body tissues, allowing injured tissues a chance to heal and limiting painful experiences. The following are general guidelines for improved posture.  While reading these guidelines, remember:  The exercises prescribed by your provider will help you have  the flexibility and strength to maintain correct postures.  The correct posture provides the best environment for your joints to work. All of your joints have less wear and tear when properly supported by a spine with good posture. This means you will experience a healthier, less painful body.  Correct posture must be practiced with all of your activities, especially prolonged sitting and standing. Correct posture is as important when doing repetitive low-stress activities (typing) as it is when doing a single heavy-load activity (lifting).  RESTING POSITIONS Consider which positions are most painful for you when choosing a resting position. If you have pain with flexion-based activities (sitting, bending, stooping, squatting), choose a position that allows you to rest in a less flexed posture. You would want to avoid curling into a fetal position on your side. If your pain worsens with extension-based activities (prolonged standing, working overhead), avoid resting in an extended position such as sleeping on your stomach. Most people will find more comfort when they rest with their spine in a more neutral position, neither too rounded nor too arched. Lying on a non-sagging bed on your side with a pillow between your knees, or on your back with a pillow under your knees will often provide some relief. Keep in mind, being in any one position for a prolonged period of time, no matter how correct your posture, can still lead to stiffness.  PROPER SITTING POSTURE In order to minimize stress and  discomfort on your spine, you must sit with correct posture. Sitting with good posture should be effortless for a healthy body. Returning to good posture is a gradual process. Many people can work toward this most comfortably by using various supports until they have the flexibility and strength to maintain this posture on their own. When sitting with proper posture, your ears will fall over your shoulders and your shoulders will fall over your hips. You should use the back of the chair to support your upper back. Your lower back will be in a neutral position, just slightly arched. You may place a small pillow or folded towel at the base of your lower back for  support.  When working at a desk, create an environment that supports good, upright posture. Without extra support, muscles tire, which leads to excessive strain on joints and other tissues. Keep these recommendations in mind:  CHAIR:  A chair should be able to slide under your desk when your back makes contact with the back of the chair. This allows you to work closely.  The chair's height should allow your eyes to be level with the upper part of your monitor and your hands to be slightly lower than your elbows.  BODY POSITION  Your feet should make contact with the floor. If this is not possible, use a foot rest.  Keep your ears over your shoulders. This will reduce stress on your neck and low back.  INCORRECT SITTING POSTURES  If you are feeling tired and unable to assume a healthy sitting posture, do not slouch or slump. This puts excessive strain on your back tissues, causing more damage and pain. Healthier options include:  Using more support, like a lumbar pillow.  Switching tasks to something that requires you to be upright or walking.  Talking a brief walk.  Lying down to rest in a neutral-spine position.  PROLONGED STANDING WHILE SLIGHTLY LEANING FORWARD  When completing a task that requires you to lean forward while  standing in one place for a long  time, place either foot up on a stationary 2-4 inch high object to help maintain the best posture. When both feet are on the ground, the lower back tends to lose its slight inward curve. If this curve flattens (or becomes too large), then the back and your other joints will experience too much stress, tire more quickly, and can cause pain.  CORRECT STANDING POSTURES Proper standing posture should be assumed with all daily activities, even if they only take a few moments, like when brushing your teeth. As in sitting, your ears should fall over your shoulders and your shoulders should fall over your hips. You should keep a slight tension in your abdominal muscles to brace your spine. Your tailbone should point down to the ground, not behind your body, resulting in an over-extended swayback posture.   INCORRECT STANDING POSTURES  Common incorrect standing postures include a forward head, locked knees and/or an excessive swayback. WALKING Walk with an upright posture. Your ears, shoulders and hips should all line-up.  PROLONGED ACTIVITY IN A FLEXED POSITION When completing a task that requires you to bend forward at your waist or lean over a low surface, try to find a way to stabilize 3 out of 4 of your limbs. You can place a hand or elbow on your thigh or rest a knee on the surface you are reaching across. This will provide you more stability, so that your muscles do not tire as quickly. By keeping your knees relaxed, or slightly bent, you will also reduce stress across your lower back. CORRECT LIFTING TECHNIQUES  DO :  Assume a wide stance. This will provide you more stability and the opportunity to get as close as possible to the object which you are lifting.  Tense your abdominals to brace your spine. Bend at the knees and hips. Keeping your back locked in a neutral-spine position, lift using your leg muscles. Lift with your legs, keeping your back straight.  Test  the weight of unknown objects before attempting to lift them.  Try to keep your elbows locked down at your sides in order get the best strength from your shoulders when carrying an object.     Always ask for help when lifting heavy or awkward objects. INCORRECT LIFTING TECHNIQUES DO NOT:   Lock your knees when lifting, even if it is a small object.  Bend and twist. Pivot at your feet or move your feet when needing to change directions.  Assume that you can safely pick up even a paperclip without proper posture

## 2019-04-25 ENCOUNTER — Emergency Department (HOSPITAL_COMMUNITY): Payer: Medicare HMO

## 2019-04-25 ENCOUNTER — Encounter (HOSPITAL_COMMUNITY): Payer: Self-pay | Admitting: Emergency Medicine

## 2019-04-25 ENCOUNTER — Emergency Department (HOSPITAL_COMMUNITY)
Admission: EM | Admit: 2019-04-25 | Discharge: 2019-04-25 | Disposition: A | Discharge status: Home / Self Care | Payer: Medicare HMO | Attending: Emergency Medicine | Admitting: Emergency Medicine

## 2019-04-25 ENCOUNTER — Other Ambulatory Visit: Payer: Self-pay

## 2019-04-25 DIAGNOSIS — R531 Weakness: Secondary | ICD-10-CM | POA: Diagnosis not present

## 2019-04-25 DIAGNOSIS — G8929 Other chronic pain: Secondary | ICD-10-CM | POA: Insufficient documentation

## 2019-04-25 DIAGNOSIS — F419 Anxiety disorder, unspecified: Secondary | ICD-10-CM | POA: Diagnosis not present

## 2019-04-25 DIAGNOSIS — M25551 Pain in right hip: Secondary | ICD-10-CM | POA: Diagnosis not present

## 2019-04-25 DIAGNOSIS — Z951 Presence of aortocoronary bypass graft: Secondary | ICD-10-CM | POA: Diagnosis not present

## 2019-04-25 DIAGNOSIS — I119 Hypertensive heart disease without heart failure: Secondary | ICD-10-CM | POA: Insufficient documentation

## 2019-04-25 DIAGNOSIS — Z79899 Other long term (current) drug therapy: Secondary | ICD-10-CM | POA: Diagnosis not present

## 2019-04-25 DIAGNOSIS — Z923 Personal history of irradiation: Secondary | ICD-10-CM | POA: Diagnosis not present

## 2019-04-25 DIAGNOSIS — N200 Calculus of kidney: Secondary | ICD-10-CM | POA: Diagnosis not present

## 2019-04-25 DIAGNOSIS — E162 Hypoglycemia, unspecified: Secondary | ICD-10-CM

## 2019-04-25 DIAGNOSIS — Z794 Long term (current) use of insulin: Secondary | ICD-10-CM | POA: Insufficient documentation

## 2019-04-25 DIAGNOSIS — R197 Diarrhea, unspecified: Secondary | ICD-10-CM

## 2019-04-25 DIAGNOSIS — Z85048 Personal history of other malignant neoplasm of rectum, rectosigmoid junction, and anus: Secondary | ICD-10-CM | POA: Insufficient documentation

## 2019-04-25 DIAGNOSIS — E11649 Type 2 diabetes mellitus with hypoglycemia without coma: Secondary | ICD-10-CM | POA: Diagnosis not present

## 2019-04-25 DIAGNOSIS — I2510 Atherosclerotic heart disease of native coronary artery without angina pectoris: Secondary | ICD-10-CM | POA: Insufficient documentation

## 2019-04-25 LAB — CBG MONITORING, ED
Glucose-Capillary: 70 mg/dL (ref 70–99)
Glucose-Capillary: 73 mg/dL (ref 70–99)
Glucose-Capillary: 44 mg/dL — CL (ref 70–99)
Glucose-Capillary: 135 mg/dL — ABNORMAL HIGH (ref 70–99)

## 2019-04-25 LAB — CBC
HCT: 36.6 % (ref 36.0–46.0)
Hemoglobin: 12.9 g/dL (ref 12.0–15.0)
MCH: 33 pg (ref 26.0–34.0)
MCHC: 35.2 g/dL (ref 30.0–36.0)
MCV: 93.6 fL (ref 80.0–100.0)
Platelets: 137 10*3/uL — ABNORMAL LOW (ref 150–400)
RBC: 3.91 MIL/uL (ref 3.87–5.11)
RDW: 12.8 % (ref 11.5–15.5)
WBC: 5.9 10*3/uL (ref 4.0–10.5)
nRBC: 0 % (ref 0.0–0.2)

## 2019-04-25 LAB — BASIC METABOLIC PANEL
Anion gap: 9 (ref 5–15)
BUN: 7 mg/dL — ABNORMAL LOW (ref 8–23)
CO2: 26 mmol/L (ref 22–32)
Calcium: 9.4 mg/dL (ref 8.9–10.3)
Chloride: 106 mmol/L (ref 98–111)
Creatinine, Ser: 0.51 mg/dL (ref 0.44–1.00)
GFR calc Af Amer: >60 mL/min (ref >60)
GFR calc non Af Amer: >60 mL/min (ref >60)
Glucose, Bld: 80 mg/dL (ref 70–99)
Potassium: 3.9 mmol/L (ref 3.5–5.1)
Sodium: 141 mmol/L (ref 135–145)

## 2019-04-25 LAB — URINALYSIS, ROUTINE W REFLEX MICROSCOPIC
Bilirubin Urine: NEGATIVE
Glucose, UA: NEGATIVE mg/dL
Hgb urine dipstick: NEGATIVE
Ketones, ur: NEGATIVE mg/dL
Leukocytes,Ua: NEGATIVE
Nitrite: NEGATIVE
Protein, ur: NEGATIVE mg/dL
Specific Gravity, Urine: 1.014 (ref 1.005–1.030)
pH: 6 (ref 5.0–8.0)

## 2019-04-25 MED ORDER — IOHEXOL 300 MG/ML  SOLN
100.0000 mL | Freq: Once | INTRAMUSCULAR | Status: AC | PRN
  Administered 2019-04-25: 100 mL via INTRAVENOUS

## 2019-04-25 MED ORDER — CIPROFLOXACIN HCL 500 MG PO TABS
500.0000 mg | ORAL_TABLET | Freq: Once | ORAL | Status: AC
  Administered 2019-04-25: 500 mg via ORAL
  Filled 2019-04-25: qty 1

## 2019-04-25 MED ORDER — MORPHINE SULFATE (PF) 4 MG/ML IV SOLN
4.0000 mg | Freq: Once | INTRAVENOUS | Status: AC
  Administered 2019-04-25: 4 mg via INTRAVENOUS
  Filled 2019-04-25: qty 1

## 2019-04-25 MED ORDER — SODIUM CHLORIDE 0.9% FLUSH: 3.0000 mL | Freq: Once | INTRAVENOUS | Status: DC

## 2019-04-25 MED ORDER — ONDANSETRON HCL 4 MG/2ML IJ SOLN
4.0000 mg | Freq: Once | INTRAMUSCULAR | Status: AC
  Administered 2019-04-25: 4 mg via INTRAVENOUS
  Filled 2019-04-25: qty 2

## 2019-04-25 MED ORDER — DEXTROSE 50 % IV SOLN
1.0000 | Freq: Once | INTRAVENOUS | Status: AC
  Administered 2019-04-25: 50 mL via INTRAVENOUS
  Filled 2019-04-25: qty 50

## 2019-04-25 MED ORDER — CIPROFLOXACIN HCL 500 MG PO TABS: 500.0000 mg | ORAL_TABLET | Freq: Two times a day (BID) | ORAL | 0 refills | Status: DC

## 2019-04-25 MED ORDER — METRONIDAZOLE 500 MG PO TABS: 500.0000 mg | ORAL_TABLET | Freq: Two times a day (BID) | ORAL | 0 refills | Status: DC

## 2019-04-25 MED ORDER — METRONIDAZOLE 500 MG PO TABS
500.0000 mg | ORAL_TABLET | Freq: Once | ORAL | Status: AC
  Administered 2019-04-25: 500 mg via ORAL
  Filled 2019-04-25: qty 1

## 2019-04-25 MED ORDER — LORAZEPAM 0.5 MG PO TABS: 0.5000 mg | ORAL_TABLET | Freq: Two times a day (BID) | ORAL | 0 refills | Status: DC | PRN

## 2019-04-25 MED ORDER — SODIUM CHLORIDE 0.9 % IV BOLUS
1000.0000 mL | Freq: Once | INTRAVENOUS | Status: AC
  Administered 2019-04-25: 1000 mL via INTRAVENOUS

## 2019-04-25 MED ORDER — HYDROCODONE-ACETAMINOPHEN 5-325 MG PO TABS: 1.0000 | ORAL_TABLET | ORAL | 0 refills | Status: DC | PRN

## 2019-04-25 NOTE — Discharge Instructions (Addendum)
Continue to take Creon.  Take Lomotil (diphenoxylate-atropine) as needed for diarrhea.  Continue to hold your Lantus and Metformin.  Eat small, frequent meals high in protein.

## 2019-04-25 NOTE — ED Provider Notes (Signed)
[facility] EMERGENCY DEPARTMENT Provider Note   CSN: [id number] Arrival date & time: 04/25/19  1504     History Chief Complaint  Patient presents with  . Weakness  . Hypoglycemia  . Diarrhea    [name] is a 69 y.o. female.  Pt presents to the ED today with weakness and diarrhea.  Pt has had diarrhea for several days.  She has not been eating because she has diarrhea whenever she eats.  Her blood sugar has been low due to that.  She also has right hip pain that she has had for a long time, but it is hurting her more.  No falls.  No abdominal pain.  No recent abx.  No travel.        Past Medical History:  Diagnosis Date  .  Paroxysmal SVT (ANVRT)    S/p AV nodal ablation Dr. Lovena Le 09/28/08   . Anxiety   . CAD    Coronary disease status post non-ST elevation MI in May 2010 with  PCI of the left circumflex on Jun 24, 2008. She then had a PCI of  the RCA on July 15, 2008. 02/24/12 Cath, Severe 95% LAD, ostial 95% circ, ostial RCA, patent stents in distal RCA and mid circ normal LV 02/26/12 CABG with LIMA to LAD, SVG to RCA, and SVG to OM Dr. Roxan Hockey    . Cancer [facility])    adenocarcinoma of rectum  . Cataract   . Depression   . Diabetes mellitus, insulin dependent (IDDM), uncontrolled   . Heart murmur   . Hyperlipidemia   . Hypertension   . Hypertensive heart disease   . S/P radiation therapy 04/11/14-05/18/14   50.4Gy rectal  . Vertigo    Result of chemotherapy; has been to ENT and told nothing to be done    Patient Active Problem List   Diagnosis Date Noted  . Chronic right SI joint pain 04/21/2019  . Anxiety with depression 09/04/2018  . Insomnia 12/25/2017  . Urinary urgency 05/29/2017  . Dysuria 05/29/2017  . Vitamin D deficiency 05/07/2016  . Normocytic anemia 05/07/2016  . Hypoglycemia associated with type 2 diabetes mellitus ([place]) 10/05/2014  . Rectal cancer ([place]) 03/31/2014  . Diabetic retinopathy ([place]) 11/19/2012  . S/P CABG  (coronary artery bypass graft)   . Thrombocytopenia ([place])   . Obesity (BMI 30-39.9)   . History of depression   . Coronary atherosclerosis   .  Paroxysmal SVT (ANVRT)   . Type 2 diabetes mellitus with vascular disease ([place])   . Hyperlipidemia   . Hypertensive heart disease     Past Surgical History:  Procedure Laterality Date  . COLONOSCOPY    . COLONOSCOPY WITH PROPOFOL N/A 07/12/2014   Procedure: COLONOSCOPY WITH PROPOFOL POSSIBLE POLYP RESECTION;  Surgeon: [name], MD;  Location: [facility];  Service: Endoscopy;  Laterality: N/A;   to be admitted after colonoscopy-robotic surgery on 6/8  . CORONARY ANGIOPLASTY WITH STENT PLACEMENT    . CORONARY ARTERY BYPASS GRAFT  02/26/2012   Roxan Hockey, MD;  Location: [facility];  Service: Open Heart Surgery;  Laterality: N/A;  CABG x three,  using left internal mammary artery  and left leg greater saphenous vein,   . ENDOVEIN HARVEST OF GREATER SAPHENOUS VEIN  02/26/2012  . EUS N/A 03/24/2014   Procedure: LOWER ENDOSCOPIC ULTRASOUND (EUS);  Surgeon: Milus Banister, MD;  Location: [facility];  Service: Endoscopy;  Laterality: N/A;  . LEFT HEART CATHETERIZATION WITH CORONARY ANGIOGRAM N/A 02/24/2012  Procedure: LEFT HEART CATHETERIZATION WITH CORONARY ANGIOGRAM;  Surgeon: Jacolyn Reedy, MD;  Location: [facility] CATH LAB;  Service: Cardiovascular;  Laterality: N/A;     OB History   No obstetric history on file.     Family History  Problem Relation Age of Onset  . Diabetes Father   . Diabetes Brother   . Alzheimer's disease Brother 71  . Diabetes Sister   . Colon polyps Maternal Grandmother   . Colon cancer Neg Hx   . Esophageal cancer Neg Hx   . Liver cancer Neg Hx   . Pancreatic cancer Neg Hx   . Rectal cancer Neg Hx   . Stomach cancer Neg Hx     Social History   Tobacco Use  . Smoking status: Never Smoker  . Smokeless tobacco: Never Used  Substance Use Topics  . Alcohol use: No    Alcohol/week: 0.0 standard drinks  . Drug  use: No    Home Medications Prior to Admission medications   Medication Sig Start Date End Date Taking? Authorizing Provider  ARIPiprazole (ABILIFY) 10 MG tablet Take 1 tablet (10 mg total) by mouth daily. 10/02/18   Shelda Pal, DO  aspirin 81 MG tablet Take 81 mg by mouth at bedtime.     [provider]  Aspirin Buf,CaCarb-MgCarb-MgO, 81 MG TABS Take by mouth.    [provider]  atorvastatin (LIPITOR) 10 MG tablet TAKE 1 TABLET(10 MG) BY MOUTH DAILY 03/17/19   Nani Ravens, Crosby Oyster, DO  Blood Glucose Monitoring Suppl (ACCU-CHEK NANO SMARTVIEW) w/Device KIT Test once daily E11.59 09/06/16   Burchette, Alinda Sierras, MD  ciprofloxacin (CIPRO) 500 MG tablet Take 1 tablet (500 mg total) by mouth 2 (two) times daily. 04/25/19   Isla Pence, MD  conjugated estrogens (PREMARIN) vaginal cream Apply pea size amount daily for 1 week then 2-3 times per week. 05/29/17   Shelda Pal, DO  CREON [phone] units CPEP TAKE 1 CAPSULE BY MOUTH THREE TIMES DAILY WITH MEALS Patient not taking: Reported on 04/21/2019 03/29/19   [place], Holli Humbles, NP  diphenoxylate-atropine (LOMOTIL) 2.5-0.025 MG tablet TAKE 1 TABLET BY MOUTH FOUR TIMES DAILY AS NEEDED FOR DIARRHEA/LOOSE STOOLS. MAXIMUM DAILY DOSE IS 8 TABLETS 03/28/19   Volanda Napoleon, MD  DROPLET INSULIN SYRINGE 31G X 5/16" 1 ML MISC USE AS DIRECTED WITH 88 UNITS OF LANTUS NIGHTLY. 12/28/18   Shelda Pal, DO  escitalopram (LEXAPRO) 10 MG tablet Take 1 tablet (10 mg total) by mouth daily. 03/29/19   Shelda Pal, DO  glucose blood (ACCU-CHEK SMARTVIEW) test strip Test once daily E11.59 09/06/16   Burchette, Alinda Sierras, MD  HYDROcodone-acetaminophen (NORCO/VICODIN) 5-325 MG tablet Take 1 tablet by mouth every 4 (four) hours as needed. 04/25/19   Isla Pence, MD  insulin glargine (LANTUS) 100 UNIT/ML injection ADMINISTER 88 UNITS UNDER THE SKIN AT BEDTIME 11/02/18   [place], Crosby Oyster, DO  LORazepam  (ATIVAN) 0.5 MG tablet Take 1 tablet (0.5 mg total) by mouth 2 (two) times daily as needed for anxiety. 04/25/19   Isla Pence, MD  methenamine (HIPREX) 1 g tablet TAKE 1 TABLET(1 GRAM) BY MOUTH TWICE DAILY WITH A MEAL Patient not taking: Reported on 04/21/2019 03/22/19   Shelda Pal, DO  metoprolol tartrate (LOPRESSOR) 25 MG tablet Take 1 tablet (25 mg total) by mouth 2 (two) times daily. 01/18/19   Shelda Pal, DO  metroNIDAZOLE (FLAGYL) 500 MG tablet Take 1 tablet (500 mg total) by mouth 2 (  two) times daily. 04/25/19   Isla Pence, MD  Vitamin D, Ergocalciferol, (DRISDOL) 1.25 MG (50000 UT) CAPS capsule Take 1 capsule once a week by mouth. 07/02/18   [place], Holli Humbles, NP    Allergies    Patient has no known allergies.  Review of Systems   Review of Systems  Gastrointestinal: Positive for diarrhea.  Endocrine:       Low bs  Neurological: Positive for weakness.  All other systems reviewed and are negative.   Physical Exam Updated Vital Signs BP (!) 157/51   Pulse 65   Temp 98.9 F (37.2 C) (Oral)   Resp 15   SpO2 93%   Physical Exam Vitals and nursing note reviewed.  Constitutional:      Appearance: Normal appearance.  HENT:     Head: Normocephalic and atraumatic.     Right Ear: External ear normal.     Left Ear: External ear normal.     Nose: Nose normal.     Mouth/Throat:     Mouth: Mucous membranes are moist.     Pharynx: Oropharynx is clear.  Eyes:     Extraocular Movements: Extraocular movements intact.     Conjunctiva/sclera: Conjunctivae normal.     Pupils: Pupils are equal, round, and reactive to light.  Cardiovascular:     Rate and Rhythm: Normal rate and regular rhythm.     Pulses: Normal pulses.     Heart sounds: Normal heart sounds.  Pulmonary:     Effort: Pulmonary effort is normal.     Breath sounds: Normal breath sounds.  Abdominal:     General: Abdomen is flat. Bowel sounds are normal.     Palpations: Abdomen is  soft.  Musculoskeletal:        General: Normal range of motion.     Cervical back: Normal range of motion and neck supple.  Skin:    General: Skin is warm.     Capillary Refill: Capillary refill takes less than 2 seconds.  Neurological:     General: No focal deficit present.     Mental Status: She is alert and oriented to person, place, and time.  Psychiatric:        Mood and Affect: Mood normal.        Behavior: Behavior normal.        Thought Content: Thought content normal.        Judgment: Judgment normal.     ED Results / Procedures / Treatments   Labs (all labs ordered are listed, but only abnormal results are displayed) Labs Reviewed  BASIC METABOLIC PANEL - Abnormal; Notable for the following components:      Result Value   BUN 7 (*)    All other components within normal limits  CBC - Abnormal; Notable for the following components:   Platelets 137 (*)    All other components within normal limits  URINALYSIS, ROUTINE W REFLEX MICROSCOPIC - Abnormal; Notable for the following components:   Color, Urine STRAW (*)    All other components within normal limits  CBG MONITORING, ED - Abnormal; Notable for the following components:   Glucose-Capillary 44 (*)    All other components within normal limits  CBG MONITORING, ED - Abnormal; Notable for the following components:   Glucose-Capillary 135 (*)    All other components within normal limits  GI PATHOGEN PANEL BY PCR, STOOL  C DIFFICILE QUICK SCREEN W PCR REFLEX  CBG MONITORING, ED  CBG MONITORING, ED  EKG EKG Interpretation  Date/Time:  Sunday April 25 2019 15:14:10 EDT Ventricular Rate:  73 PR Interval:  174 QRS Duration: 94 QT Interval:  414 QTC Calculation: 456 R Axis:   -55 Text Interpretation: Normal sinus rhythm Left anterior fascicular block Moderate voltage criteria for LVH, may be normal variant ( R in aVL , Cornell product ) Possible Anterolateral infarct , age undetermined Abnormal ECG No  significant change since last tracing Confirmed by Isla Pence [phone]) on 04/25/2019 4:44:52 PM   Radiology DG Chest 2 View  Result Date: 04/25/2019 CLINICAL DATA:  Weakness. EXAM: CHEST - 2 VIEW COMPARISON:  December 17, 2014 FINDINGS: The heart size is stable from prior study. The patient is status post prior median sternotomy. There is no pneumothorax or large pleural effusion. No focal infiltrate. No acute displaced fracture. IMPRESSION: No active cardiopulmonary disease. Electronically Signed   By: Constance Holster M.D.   On: 04/25/2019 17:28   CT ABDOMEN PELVIS W CONTRAST  Result Date: 04/25/2019 CLINICAL DATA:  Diarrhea EXAM: CT ABDOMEN AND PELVIS WITH CONTRAST TECHNIQUE: Multidetector CT imaging of the abdomen and pelvis was performed using the standard protocol following bolus administration of intravenous contrast. CONTRAST:  162m OMNIPAQUE IOHEXOL 300 MG/ML  SOLN COMPARISON:  2016 FINDINGS: Lower chest: Increase in size left lower lobe nodule now measuring 5 mm (previously 4 mm) on series 6, image 58. Hepatobiliary: No focal liver abnormality is seen. No gallstones, gallbladder wall thickening, or biliary dilatation. Pancreas: Unremarkable Spleen: Unremarkable. Adrenals/Urinary Tract: 2 mm nonobstructing calculus of the interpolar right kidney and punctate nonobstructing calculus of the upper pole right kidney. Adrenals are unremarkable. Tiny focus of air within the bladder is likely related to catheterization. Stomach/Bowel: Stomach is within normal limits. Bowel is normal in caliber. There are postoperative changes present. Vascular/Lymphatic: Aortic atherosclerosis. No enlarged abdominal or pelvic lymph nodes. Reproductive: Uterus and bilateral adnexa are unremarkable. Other: No ascites. Musculoskeletal: Multilevel degenerative changes of the spine superimposed on scoliotic curvature. IMPRESSION: No evidence of acute infectious/inflammatory process. Evidence of prior bowel surgery.  Small nonobstructing right renal calculi. Slight increase in size of 5 mm left lower lobe nodule since 2016. Non-contrast chest CT can be considered in 12 months. Electronically Signed   By: PMacy MisM.D.   On: 04/25/2019 18:38    Procedures Procedures (including critical care time)  Medications Ordered in ED Medications  sodium chloride flush (NS) 0.9 % injection 3 mL (has no administration in time range)  sodium chloride 0.9 % bolus 1,000 mL (0 mLs Intravenous Stopped 04/25/19 1836)  ondansetron (ZOFRAN) injection 4 mg (4 mg Intravenous Given 04/25/19 1708)  morphine 4 MG/ML injection 4 mg (4 mg Intravenous Given 04/25/19 1708)  iohexol (OMNIPAQUE) 300 MG/ML solution 100 mL (100 mLs Intravenous Contrast Given 04/25/19 1810)  dextrose 50 % solution 50 mL (50 mLs Intravenous Given 04/25/19 1854)  ciprofloxacin (CIPRO) tablet 500 mg (500 mg Oral Given 04/25/19 2039)  metroNIDAZOLE (FLAGYL) tablet 500 mg (500 mg Oral Given 04/25/19 2039)    ED Course  I have reviewed the triage vital signs and the nursing notes.  Pertinent labs & imaging results that were available during my care of the patient were reviewed by me and considered in my medical decision making (see chart for details).    MDM Rules/Calculators/A&P                     After pt arrived, bs did drop again.  She was given 1  amp d50.  After CT done,  Pt able to eat some crackers and coke.  She is feeling much better.  BS is back to nl.  She is extremely anxious and requesting some anxiety medication.  She is given a rx for ativan.  Stool studies ordered, but she did not have any diarrhea while here.  The pt has a rx for lomotil which she's not been taking.  She is told she can take that.  I will add on cipro/flagyl in case it is an infectious etiology.  Pt knows to continue to hold her metformin and lantus.  She is to continue her Creon.  She is to f/u with pcp and to return if worse.   Final Clinical Impression(s) / ED  Diagnoses Final diagnoses:  Hypoglycemia  Diarrhea, unspecified type  Anxiety  Chronic pain of right hip    Rx / DC Orders ED Discharge Orders         Ordered    ciprofloxacin (CIPRO) 500 MG tablet  2 times daily     04/25/19 2029    metroNIDAZOLE (FLAGYL) 500 MG tablet  2 times daily     04/25/19 2029    HYDROcodone-acetaminophen (NORCO/VICODIN) 5-325 MG tablet  Every 4 hours PRN     04/25/19 2029    LORazepam (ATIVAN) 0.5 MG tablet  2 times daily PRN     04/25/19 2029           Isla Pence, MD 04/25/19 2052

## 2019-04-25 NOTE — ED Triage Notes (Addendum)
C/o generalized weakness and diarrhea x 1 week.  States her blood sugar dropped to 45 on Wednesday and she had a syncopal episode.  States CBG was 50 this morning.

## 2019-04-25 NOTE — ED Notes (Signed)
Attempted to call pt son to inform him that the pt will be discharge. Unable to get ahold of the pt son. Called the pt sister and she will be coming to get the pt.

## 2019-04-25 NOTE — ED Notes (Signed)
Discharge instructions reviewed with pt. Pt verbalized understanding.   

## 2019-04-26 ENCOUNTER — Other Ambulatory Visit: Payer: Self-pay | Admitting: Family Medicine

## 2019-04-26 ENCOUNTER — Ambulatory Visit (INDEPENDENT_AMBULATORY_CARE_PROVIDER_SITE_OTHER): Payer: Medicare HMO | Admitting: Family Medicine

## 2019-04-26 ENCOUNTER — Encounter: Payer: Self-pay | Admitting: Family Medicine

## 2019-04-26 VITALS — BP 124/84 | HR 55 | Temp 95.2°F | Wt 146.2 lb

## 2019-04-26 DIAGNOSIS — M47818 Spondylosis without myelopathy or radiculopathy, sacral and sacrococcygeal region: Secondary | ICD-10-CM | POA: Diagnosis not present

## 2019-04-26 DIAGNOSIS — R197 Diarrhea, unspecified: Secondary | ICD-10-CM | POA: Diagnosis not present

## 2019-04-26 DIAGNOSIS — M25551 Pain in right hip: Secondary | ICD-10-CM

## 2019-04-26 MED ORDER — CHOLESTYRAMINE 4 G PO PACK: 4.0000 g | PACK | Freq: Two times a day (BID) | ORAL | 1 refills | Status: DC

## 2019-04-26 MED ORDER — METHYLPREDNISOLONE ACETATE 40 MG/ML IJ SUSP
40.0000 mg | Freq: Once | INTRAMUSCULAR | Status: AC
  Administered 2019-04-26: 16:00:00 40 mg via INTRA_ARTICULAR

## 2019-04-26 NOTE — Patient Instructions (Signed)
If you do not hear anything about your referral in the next 1-2 weeks, call our office and ask for an update.  Ice/cold pack over area for 10-15 min twice daily.  Heat (pad or rice pillow in microwave) over affected area, 10-15 minutes twice daily.   OK to take Tylenol 1000 mg (2 extra strength tabs) or 975 mg (3 regular strength tabs) every 6 hours as needed.  Start eating again. If the diarrhea picks up, I am going to send you to a GI specialist if stool studies are normal.  Continue to monitor your sugars. I don't think you will drop low again if you are eating.  Let us know if you need anything.

## 2019-04-26 NOTE — Progress Notes (Signed)
Chief Complaint  [name] presents with  . Follow-up    Subjective: [name] is a 69 y.o. female here for f/u.  [name] continues to have diarrhea.  This is affecting her sugars as she is not eating out of fear.  Today she has not had anything.  She went to the emergency department yesterday and CT was unremarkable.  She was started on Cipro and Flagyl.  She did drop off a stool sample today.  She has been off of her Lantus and Metformin.  She continues to have right hip pain.  She was given hydrocodone in the emergency department as well as Ativan for anxiety.  She is requesting a referral to the orthopedic surgery team.  Past Medical History:  Diagnosis Date  .  Paroxysmal SVT (ANVRT)    S/p AV nodal ablation Dr. Lovena Le 09/28/08   . Anxiety   . CAD    Coronary disease status post non-ST elevation MI in May 2010 with  PCI of the left circumflex on Jun 24, 2008. She then had a PCI of  the RCA on July 15, 2008. 02/24/12 Cath, Severe 95% LAD, ostial 95% circ, ostial RCA, patent stents in distal RCA and mid circ normal LV 02/26/12 CABG with LIMA to LAD, SVG to RCA, and SVG to OM Dr. Roxan Hockey    . Cancer [facility])    adenocarcinoma of rectum  . Cataract   . Depression   . Diabetes mellitus, insulin dependent (IDDM), uncontrolled   . Heart murmur   . Hyperlipidemia   . Hypertension   . Hypertensive heart disease   . S/P radiation therapy 04/11/14-05/18/14   50.4Gy rectal  . Vertigo    Result of chemotherapy; has been to ENT and told nothing to be done    Objective: BP 124/84 (BP Location: Left Arm, [name] Position: Sitting, Cuff Size: Normal)   Pulse (!) 55   Temp (!) 95.2 F (35.1 C) (Temporal)   Wt 146 lb 4 oz (66.3 kg)   SpO2 93%   BMI 28.56 kg/m  General: Awake, appears stated age HEENT: MMM, EOMi Heart: RRR, +SEM heard loudest at aortic listening post Lungs: CTAB, no rales, wheezes or rhonchi. No accessory muscle use MSK: + TTP over the right SI joint; negative Stinchfield,  logroll, FABER, FADDIR Psych: Age appropriate judgment and insight, normal affect and mood  Procedure note; SI joint injection Informed consent obtained. The PSIS's were palpated and demarcated with an otoscope speculum tip just medial to the side of interest. The area was cleaned with alcohol and topical freeze spray was used. The joint was entered and 40 mg Depo-Medrol with 2 mL of 1% lidocaine was injected. The area was then bandaged. There were no complications noted.  The [name] tolerated the procedure well.  Assessment and Plan: Diarrhea, unspecified type - Plan: cholestyramine (QUESTRAN) 4 g packet  SI joint arthritis  Right hip pain - Plan: Ambulatory referral to Orthopedic Surgery  1-await results of stool study.  Start Questran as needed.  Continue Flagyl/Cipro for now. 2-injection as above.  Ice, Tylenol, heat. 3-refer to orthopedic surgery team. Follow-up in 2 weeks to review sugars.  I want her to eat. The [name] voiced understanding and agreement to the plan.  [place], DO 04/26/19  3:45 PM

## 2019-04-26 NOTE — Addendum Note (Signed)
Addended by: Kelle Darting A on: 04/26/2019 05:12 PM   Modules accepted: Orders

## 2019-04-28 ENCOUNTER — Telehealth: Payer: Self-pay | Admitting: Family Medicine

## 2019-04-30 LAB — OVA AND PARASITE EXAMINATION
CONCENTRATE RESULT:: NONE SEEN
MICRO NUMBER:: 10277005
SPECIMEN QUALITY:: ADEQUATE
TRICHROME RESULT:: NONE SEEN

## 2019-04-30 LAB — STOOL CULTURE
MICRO NUMBER:: 10277003
MICRO NUMBER:: 10277004
MICRO NUMBER:: 10277006
SHIGA RESULT:: NOT DETECTED
SPECIMEN QUALITY:: ADEQUATE
SPECIMEN QUALITY:: ADEQUATE
SPECIMEN QUALITY:: ADEQUATE

## 2019-05-03 ENCOUNTER — Telehealth: Payer: Self-pay

## 2019-05-03 NOTE — Telephone Encounter (Signed)
Who rx'd this? I don't think it was me?

## 2019-05-03 NOTE — Telephone Encounter (Signed)
Patient called in to see if Dr. Nani Ravens could send in a prescription for LORazepam (ATIVAN) 0.5 MG tablet [id number]   Please send it to [facility] [place], [place] - [address]  [place], [place] [address]  Phone:  [phone] Fax:  [phone]  DEA #:  [id number]

## 2019-05-03 NOTE — Telephone Encounter (Signed)
Last RF---#10 on 04/25/19----  Requesting Lorazepam Last OV---04/26/19 Next OV---05/11/19

## 2019-05-04 MED ORDER — LORAZEPAM 0.5 MG PO TABS: 0.5000 mg | ORAL_TABLET | Freq: Two times a day (BID) | ORAL | 0 refills | Status: DC | PRN

## 2019-05-04 NOTE — Addendum Note (Signed)
Addended by: Ames Coupe on: 05/04/2019 07:21 PM   Modules accepted: Orders

## 2019-05-04 NOTE — Telephone Encounter (Signed)
The patient was in the ER on 04/25/19 and was prescribed by the MD that she saw.

## 2019-05-04 NOTE — Telephone Encounter (Signed)
Will do another short course. If she needs more after this, she will need an appointment. This is not a medicine I want her on long term. Ty.

## 2019-05-05 NOTE — Telephone Encounter (Signed)
Called left message to call back 

## 2019-05-05 NOTE — Telephone Encounter (Signed)
Called informed the patient of PCP Instructions/she has appt on Tuesday 05/11/19 and will discuss

## 2019-05-10 ENCOUNTER — Other Ambulatory Visit: Payer: Self-pay | Admitting: Family

## 2019-05-10 DIAGNOSIS — K8689 Other specified diseases of pancreas: Secondary | ICD-10-CM

## 2019-05-10 DIAGNOSIS — C20 Malignant neoplasm of rectum: Secondary | ICD-10-CM

## 2019-05-11 ENCOUNTER — Encounter: Payer: Self-pay | Admitting: Family Medicine

## 2019-05-11 ENCOUNTER — Ambulatory Visit (INDEPENDENT_AMBULATORY_CARE_PROVIDER_SITE_OTHER): Payer: Medicare HMO | Admitting: Family Medicine

## 2019-05-11 ENCOUNTER — Other Ambulatory Visit: Payer: Self-pay

## 2019-05-11 VITALS — BP 112/72 | HR 64 | Temp 97.0°F | Ht 60.0 in | Wt 139.1 lb

## 2019-05-11 DIAGNOSIS — Z742 Need for assistance at home and no other household member able to render care: Secondary | ICD-10-CM

## 2019-05-11 DIAGNOSIS — M25551 Pain in right hip: Secondary | ICD-10-CM

## 2019-05-11 DIAGNOSIS — R197 Diarrhea, unspecified: Secondary | ICD-10-CM | POA: Diagnosis not present

## 2019-05-11 MED ORDER — TRAMADOL HCL 50 MG PO TABS: 50.0000 mg | ORAL_TABLET | Freq: Three times a day (TID) | ORAL | 0 refills | Status: DC | PRN

## 2019-05-11 MED ORDER — HYDROXYZINE HCL 25 MG PO TABS: 25.0000 mg | ORAL_TABLET | Freq: Three times a day (TID) | ORAL | 0 refills | Status: DC | PRN

## 2019-05-11 NOTE — Patient Instructions (Addendum)
Someone should reach out regarding home health.   If you do not hear anything about your referral in the next 1-2 weeks, call our office and ask for an update.  Stay hydrated.  Let us know if you need anything.

## 2019-05-11 NOTE — Progress Notes (Signed)
Chief Complaint  Patient presents with  . Follow-up    Subjective: Patient is a 69 y.o. female here for follow up.  R hip pain continues. Wants to see ortho for SI joint pain, prefers [facility].  It is quite painful, the injection did not help long-term.  Has been using Ativan due to anxiety related to pain.  She reports diffuse weakness as well.  No pain associated with this.  She has lost 7 pounds but attributes that to diarrhea and anxiety with eating out of concern for the diarrhea.  She still having around 2 days a week of diarrhea.  She is using Metamucil.  Her stool studies were negative.  She has not seen a specialist.  Denies bleeding or fevers.   Past Medical History:  Diagnosis Date  .  Paroxysmal SVT (ANVRT)    S/p AV nodal ablation Dr. Lovena Le 09/28/08   . Anxiety   . CAD    Coronary disease status post non-ST elevation MI in May 2010 with  PCI of the left circumflex on Jun 24, 2008. She then had a PCI of  the RCA on July 15, 2008. 02/24/12 Cath, Severe 95% LAD, ostial 95% circ, ostial RCA, patent stents in distal RCA and mid circ normal LV 02/26/12 CABG with LIMA to LAD, SVG to RCA, and SVG to OM Dr. Roxan Hockey    . Cancer [facility])    adenocarcinoma of rectum  . Cataract   . Depression   . Diabetes mellitus, insulin dependent (IDDM), uncontrolled   . Heart murmur   . Hyperlipidemia   . Hypertension   . Hypertensive heart disease   . S/P radiation therapy 04/11/14-05/18/14   50.4Gy rectal  . Vertigo    Result of chemotherapy; has been to ENT and told nothing to be done    Objective: BP 112/72 (BP Location: Left Arm, Patient Position: Sitting, Cuff Size: Normal)   Pulse 64   Temp (!) 97 F (36.1 C) (Temporal)   Ht 5' (1.524 m)   Wt 139 lb 2 oz (63.1 kg)   SpO2 95%   BMI 27.17 kg/m  General: Awake, appears stated age HEENT: Hard of hearing Heart: RRR Lungs: CTAB, no rales, wheezes or rhonchi. No accessory muscle use MSK:+ TTP over the right SI joint; right hip:  Positive Patrick's; negative Stinchfield, logroll, FADDIR; no tenderness over the greater trochanteric bursa Psych: normal affect and mood  Assessment and Plan: Need for home health care - Plan: Ambulatory referral to [place]  Right hip pain - Plan: Ambulatory referral to Orthopedic Surgery  Diarrhea, unspecified type - Plan: Ambulatory referral to Gastroenterology  Orders as above. The patient and her sister voiced understanding and agreement to the plan.  [place], DO 05/11/19  1:59 PM

## 2019-05-17 ENCOUNTER — Telehealth: Payer: Self-pay | Admitting: Family Medicine

## 2019-05-17 DIAGNOSIS — F418 Other specified anxiety disorders: Secondary | ICD-10-CM | POA: Diagnosis not present

## 2019-05-17 DIAGNOSIS — M25551 Pain in right hip: Secondary | ICD-10-CM | POA: Diagnosis not present

## 2019-05-17 DIAGNOSIS — E11319 Type 2 diabetes mellitus with unspecified diabetic retinopathy without macular edema: Secondary | ICD-10-CM | POA: Diagnosis not present

## 2019-05-17 DIAGNOSIS — M533 Sacrococcygeal disorders, not elsewhere classified: Secondary | ICD-10-CM | POA: Diagnosis not present

## 2019-05-17 DIAGNOSIS — I10 Essential (primary) hypertension: Secondary | ICD-10-CM | POA: Diagnosis not present

## 2019-05-17 DIAGNOSIS — I471 Supraventricular tachycardia: Secondary | ICD-10-CM | POA: Diagnosis not present

## 2019-05-17 DIAGNOSIS — G8929 Other chronic pain: Secondary | ICD-10-CM | POA: Diagnosis not present

## 2019-05-17 DIAGNOSIS — I2510 Atherosclerotic heart disease of native coronary artery without angina pectoris: Secondary | ICD-10-CM | POA: Diagnosis not present

## 2019-05-17 DIAGNOSIS — D649 Anemia, unspecified: Secondary | ICD-10-CM | POA: Diagnosis not present

## 2019-05-17 NOTE — Telephone Encounter (Signed)
Usually PT/OT makes their recommendations based on what they think the patient needs and we approve. I am OK with whatever they feel will best improve the patient. Ty.

## 2019-05-17 NOTE — Telephone Encounter (Signed)
Physical therapist Francee Nodal  That's working with patient . Pt evaul done today . Asking if he should see patient 2x week .for 2 weeks or 1w2. [phone]. 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

## 2019-05-18 ENCOUNTER — Telehealth: Payer: Self-pay | Admitting: Family Medicine

## 2019-05-18 MED ORDER — HYDROXYZINE HCL 25 MG PO TABS: 25.0000 mg | ORAL_TABLET | Freq: Three times a day (TID) | ORAL | 2 refills | Status: DC | PRN

## 2019-05-18 NOTE — Telephone Encounter (Signed)
Medication: hydrOXYzine (ATARAX/VISTARIL) 25 MG tablet   Has the patient contacted their pharmacy? No. (If no, request that the patient contact the pharmacy for the refill.) (If yes, when and what did the pharmacy advise?)  Preferred Pharmacy (with phone number or street name):  [facility], [place] - [address] AT [address]  [place], [place] [address]  Phone:  [phone] Fax:  [phone]    Agent: Please be advised that RX refills may take up to 3 business days. We ask that you follow-up with your pharmacy.

## 2019-05-18 NOTE — Telephone Encounter (Signed)
Called [facility] informed ok per PCP also [facility] request home heatlh aid in the home as well. Ok'd all request for this patient

## 2019-05-18 NOTE — Telephone Encounter (Signed)
Is it ok to refill was just sent in on 05/11/2019

## 2019-05-19 MED ORDER — HYDROXYZINE HCL 25 MG PO TABS: 25.0000 mg | ORAL_TABLET | Freq: Three times a day (TID) | ORAL | 2 refills | Status: DC | PRN

## 2019-05-19 NOTE — Addendum Note (Signed)
Addended by: Sharon Seller B on: 05/19/2019 09:35 AM   Modules accepted: Orders

## 2019-05-19 NOTE — Telephone Encounter (Signed)
Called and canceled/resent to [facility] the patient to inform

## 2019-05-19 NOTE — Telephone Encounter (Signed)
Hey [name] pat request for med to go to [facility]    It looks like it was sent to [facility]. Pt is living with her sister in [place] Please have refill transferred. Thanks

## 2019-05-20 ENCOUNTER — Telehealth: Payer: Self-pay | Admitting: Family Medicine

## 2019-05-20 DIAGNOSIS — G8929 Other chronic pain: Secondary | ICD-10-CM | POA: Diagnosis not present

## 2019-05-20 DIAGNOSIS — F418 Other specified anxiety disorders: Secondary | ICD-10-CM | POA: Diagnosis not present

## 2019-05-20 DIAGNOSIS — D649 Anemia, unspecified: Secondary | ICD-10-CM | POA: Diagnosis not present

## 2019-05-20 DIAGNOSIS — I2510 Atherosclerotic heart disease of native coronary artery without angina pectoris: Secondary | ICD-10-CM | POA: Diagnosis not present

## 2019-05-20 DIAGNOSIS — I471 Supraventricular tachycardia: Secondary | ICD-10-CM | POA: Diagnosis not present

## 2019-05-20 DIAGNOSIS — M533 Sacrococcygeal disorders, not elsewhere classified: Secondary | ICD-10-CM | POA: Diagnosis not present

## 2019-05-20 DIAGNOSIS — E11319 Type 2 diabetes mellitus with unspecified diabetic retinopathy without macular edema: Secondary | ICD-10-CM | POA: Diagnosis not present

## 2019-05-20 DIAGNOSIS — M25551 Pain in right hip: Secondary | ICD-10-CM | POA: Diagnosis not present

## 2019-05-20 DIAGNOSIS — I10 Essential (primary) hypertension: Secondary | ICD-10-CM | POA: Diagnosis not present

## 2019-05-20 NOTE — Telephone Encounter (Signed)
Patient is not taking insulin. She states her fingers were stiff and was having trouble sticking herself. She gave nurse reading of 135... 163... 162... Nurse states patients was using her meds incorrectly.Marland Kitchen  She was taking Tramdol for Anxiety. Nurse thinks patient may needs an appt. Serita Butcher  is with [facility]. Patient should be back home on Sunday evening in [place] with her son. She is currently with her sister in [place].Marland Kitchen

## 2019-05-21 ENCOUNTER — Telehealth: Payer: Self-pay | Admitting: Family Medicine

## 2019-05-21 NOTE — Telephone Encounter (Signed)
Called [facility] informed of PCP verbal ok for request. The RN stated the patient is afraid of her BS getting too low and does not want to take her insulin. She will be going back over to her house next week and will keep updated of how she is doing.

## 2019-05-21 NOTE — Telephone Encounter (Signed)
Called informed of PCP instructions regarding BS Verbalized understanding.

## 2019-05-21 NOTE — Telephone Encounter (Signed)
Caller : [place] Call Back # [phone]   Requesting Nursing For Diabetic Education   Frequency: 1 time for 5 weeks  and Once  every other week for 4 weeks    Patient is refusing to take medication for diabetes , patient is afraid that blood sugar will drop too low.  Per Altha Harm she has order a free style libre for patient however, patient does not have a smart phone.

## 2019-05-21 NOTE — Telephone Encounter (Signed)
Called the home number as well as the patients sister's number left message on both to call back.

## 2019-05-21 NOTE — Telephone Encounter (Signed)
OK 

## 2019-05-21 NOTE — Telephone Encounter (Signed)
Fasting sugars should be mid 100's or lower.

## 2019-05-21 NOTE — Telephone Encounter (Signed)
Called and spoke to the patient and her sister. The sister is aware of misunderstanding of how to take her medications. The OT went over them yesterday and now both the patient and sister verbally understand. The sister/patient felt not needed right now for an appointment since patient is returning home on Sunday and will followup with PCP/appt at that time. The patient had one question and needs to know range he wants her BS to be.

## 2019-05-24 DIAGNOSIS — D649 Anemia, unspecified: Secondary | ICD-10-CM | POA: Diagnosis not present

## 2019-05-24 DIAGNOSIS — F418 Other specified anxiety disorders: Secondary | ICD-10-CM | POA: Diagnosis not present

## 2019-05-24 DIAGNOSIS — I2510 Atherosclerotic heart disease of native coronary artery without angina pectoris: Secondary | ICD-10-CM | POA: Diagnosis not present

## 2019-05-24 DIAGNOSIS — M25551 Pain in right hip: Secondary | ICD-10-CM | POA: Diagnosis not present

## 2019-05-24 DIAGNOSIS — E11319 Type 2 diabetes mellitus with unspecified diabetic retinopathy without macular edema: Secondary | ICD-10-CM | POA: Diagnosis not present

## 2019-05-24 DIAGNOSIS — I471 Supraventricular tachycardia: Secondary | ICD-10-CM | POA: Diagnosis not present

## 2019-05-24 DIAGNOSIS — G8929 Other chronic pain: Secondary | ICD-10-CM | POA: Diagnosis not present

## 2019-05-24 DIAGNOSIS — R399 Unspecified symptoms and signs involving the genitourinary system: Secondary | ICD-10-CM | POA: Diagnosis not present

## 2019-05-24 DIAGNOSIS — M533 Sacrococcygeal disorders, not elsewhere classified: Secondary | ICD-10-CM | POA: Diagnosis not present

## 2019-05-24 DIAGNOSIS — I10 Essential (primary) hypertension: Secondary | ICD-10-CM | POA: Diagnosis not present

## 2019-05-25 ENCOUNTER — Ambulatory Visit: Payer: Medicare HMO | Admitting: Family Medicine

## 2019-05-25 ENCOUNTER — Telehealth: Payer: Self-pay | Admitting: Family Medicine

## 2019-05-25 DIAGNOSIS — R399 Unspecified symptoms and signs involving the genitourinary system: Secondary | ICD-10-CM | POA: Diagnosis not present

## 2019-05-25 DIAGNOSIS — I10 Essential (primary) hypertension: Secondary | ICD-10-CM | POA: Diagnosis not present

## 2019-05-25 DIAGNOSIS — E11319 Type 2 diabetes mellitus with unspecified diabetic retinopathy without macular edema: Secondary | ICD-10-CM | POA: Diagnosis not present

## 2019-05-25 DIAGNOSIS — I471 Supraventricular tachycardia: Secondary | ICD-10-CM | POA: Diagnosis not present

## 2019-05-25 DIAGNOSIS — D649 Anemia, unspecified: Secondary | ICD-10-CM | POA: Diagnosis not present

## 2019-05-25 DIAGNOSIS — F418 Other specified anxiety disorders: Secondary | ICD-10-CM | POA: Diagnosis not present

## 2019-05-25 DIAGNOSIS — I2510 Atherosclerotic heart disease of native coronary artery without angina pectoris: Secondary | ICD-10-CM | POA: Diagnosis not present

## 2019-05-25 DIAGNOSIS — M533 Sacrococcygeal disorders, not elsewhere classified: Secondary | ICD-10-CM | POA: Diagnosis not present

## 2019-05-25 DIAGNOSIS — G8929 Other chronic pain: Secondary | ICD-10-CM | POA: Diagnosis not present

## 2019-05-25 DIAGNOSIS — M25551 Pain in right hip: Secondary | ICD-10-CM | POA: Diagnosis not present

## 2019-05-25 NOTE — Telephone Encounter (Signed)
Updated medication list Called the patient to inform//left message to call back

## 2019-05-25 NOTE — Telephone Encounter (Signed)
Start back on 15 units nightly, please adjust her chart to reflect this. Send me readings in 1 week. Ty.

## 2019-05-25 NOTE — Telephone Encounter (Signed)
Called the patient and she never did stop Metformin is taking 1 tablet twice daily. She stopped the Lantus and ok to restart? She stated she felt previously she was on too much though. (88 units at bedtime) and she does not want to take that much again is afraid of her sugar dropping

## 2019-05-25 NOTE — Telephone Encounter (Signed)
Caller : [name]  Call Back # [phone]  Patient states that her blood sugar is 228  Please advise on how to take her medication to controlled diabetes.

## 2019-05-25 NOTE — Telephone Encounter (Signed)
Let's restart her metformin at a slow rate, 1/2 tab twice daily for a week and then increase to 1 tab twice daily. Have her check in next week as well with her sugar readings, may add back insulin slowly.

## 2019-05-26 ENCOUNTER — Telehealth: Payer: Self-pay | Admitting: Family Medicine

## 2019-05-26 DIAGNOSIS — I10 Essential (primary) hypertension: Secondary | ICD-10-CM | POA: Diagnosis not present

## 2019-05-26 DIAGNOSIS — E11319 Type 2 diabetes mellitus with unspecified diabetic retinopathy without macular edema: Secondary | ICD-10-CM | POA: Diagnosis not present

## 2019-05-26 DIAGNOSIS — I2510 Atherosclerotic heart disease of native coronary artery without angina pectoris: Secondary | ICD-10-CM | POA: Diagnosis not present

## 2019-05-26 DIAGNOSIS — D649 Anemia, unspecified: Secondary | ICD-10-CM | POA: Diagnosis not present

## 2019-05-26 DIAGNOSIS — M533 Sacrococcygeal disorders, not elsewhere classified: Secondary | ICD-10-CM | POA: Diagnosis not present

## 2019-05-26 DIAGNOSIS — M25551 Pain in right hip: Secondary | ICD-10-CM | POA: Diagnosis not present

## 2019-05-26 DIAGNOSIS — I471 Supraventricular tachycardia: Secondary | ICD-10-CM | POA: Diagnosis not present

## 2019-05-26 DIAGNOSIS — G8929 Other chronic pain: Secondary | ICD-10-CM | POA: Diagnosis not present

## 2019-05-26 DIAGNOSIS — F418 Other specified anxiety disorders: Secondary | ICD-10-CM | POA: Diagnosis not present

## 2019-05-26 NOTE — Telephone Encounter (Signed)
Called informed the patient of PCP instructions. The patient verbalized understanding.

## 2019-05-26 NOTE — Telephone Encounter (Signed)
Called left detailed message of PCP verbal ok for request.

## 2019-05-26 NOTE — Telephone Encounter (Signed)
Caller : [place]  Call Back # [phone]  Requesting Verbal Orders   For: 1 Time  For  -8 Weeks  For :  SELF CARE, STRENGTH AND TRANSFER

## 2019-05-27 ENCOUNTER — Other Ambulatory Visit: Payer: Self-pay

## 2019-05-27 ENCOUNTER — Telehealth: Payer: Self-pay | Admitting: Family Medicine

## 2019-05-27 NOTE — Telephone Encounter (Signed)
Medication: traMADol (ULTRAM) 50 MG tablet   Has the patient contacted their pharmacy? No. (If no, request that the patient contact the pharmacy for the refill.) (If yes, when and what did the pharmacy advise?)  Preferred Pharmacy (with phone number or street name):   [facility] [place] - [address] AT [address]  [place], [place] [address]  Phone:  [phone] Fax:  [phone]  Agent: Please be advised that RX refills may take up to 3 business days. We ask that you follow-up with your pharmacy.

## 2019-05-28 ENCOUNTER — Other Ambulatory Visit: Payer: Self-pay | Admitting: Family Medicine

## 2019-05-28 ENCOUNTER — Ambulatory Visit: Payer: Medicare HMO | Admitting: Family Medicine

## 2019-05-28 MED ORDER — TRUE METRIX AIR GLUCOSE METER W/DEVICE KIT: PACK | 0 refills | Status: DC

## 2019-05-28 MED ORDER — TRUE METRIX BLOOD GLUCOSE TEST VI STRP: ORAL_STRIP | 1 refills | Status: DC

## 2019-05-28 MED ORDER — TRUEPLUS LANCETS 33G MISC: 1 refills | Status: DC

## 2019-05-28 MED ORDER — BD SWAB SINGLE USE REGULAR PADS: MEDICATED_PAD | 1 refills | Status: DC

## 2019-05-28 MED ORDER — TRAMADOL HCL 50 MG PO TABS: 50.0000 mg | ORAL_TABLET | Freq: Three times a day (TID) | ORAL | 0 refills | Status: DC | PRN

## 2019-05-28 NOTE — Addendum Note (Signed)
Addended by: Sharon Seller B on: 05/28/2019 02:27 PM   Modules accepted: Orders

## 2019-05-28 NOTE — Telephone Encounter (Signed)
Last OV--05/11/19 Next OV--05/31/19 Last REFill---05/11/2019  #30 no refills

## 2019-05-31 ENCOUNTER — Other Ambulatory Visit: Payer: Self-pay

## 2019-05-31 ENCOUNTER — Ambulatory Visit: Payer: Medicare HMO | Admitting: Family Medicine

## 2019-05-31 ENCOUNTER — Ambulatory Visit (INDEPENDENT_AMBULATORY_CARE_PROVIDER_SITE_OTHER): Payer: Medicare HMO | Admitting: Family Medicine

## 2019-05-31 ENCOUNTER — Encounter: Payer: Self-pay | Admitting: Family Medicine

## 2019-05-31 VITALS — BP 128/64 | HR 71 | Temp 96.1°F | Ht 62.0 in | Wt 135.5 lb

## 2019-05-31 DIAGNOSIS — M533 Sacrococcygeal disorders, not elsewhere classified: Secondary | ICD-10-CM

## 2019-05-31 DIAGNOSIS — E1159 Type 2 diabetes mellitus with other circulatory complications: Secondary | ICD-10-CM

## 2019-05-31 DIAGNOSIS — G4700 Insomnia, unspecified: Secondary | ICD-10-CM

## 2019-05-31 DIAGNOSIS — G8929 Other chronic pain: Secondary | ICD-10-CM | POA: Diagnosis not present

## 2019-05-31 MED ORDER — TRAZODONE HCL 50 MG PO TABS: 25.0000 mg | ORAL_TABLET | Freq: Every evening | ORAL | 3 refills | Status: DC | PRN

## 2019-05-31 MED ORDER — INSULIN GLARGINE 100 UNIT/ML ~~LOC~~ SOLN: SUBCUTANEOUS | 5 refills | Status: DC

## 2019-05-31 NOTE — Progress Notes (Signed)
Chief Complaint  Patient presents with  . Follow-up    Subjective: Patient is a 69 y.o. female here for f/u.  Patient has a history of diabetes.  Prior to a GI illness, she was taking 88 units of Lantus nightly.  Her sugars were in the 80s-90s.  She has lost around 20 pounds since that time.  She is currently taking 20 units of Lantus nightly and her sugars are running in the low to mid 100s.  She is not having any episodes of hypoglycemia.  She is not very active but diet is better.  Patient is dealing with insomnia.  She is currently on Abilify, Lexapro for anxiety and depression.  She took one of her sons Ambien tabs but does not want anything that is habit-forming.  Past Medical History:  Diagnosis Date  .  Paroxysmal SVT (ANVRT)    S/p AV nodal ablation Dr. Lovena Le 09/28/08   . Anxiety   . CAD    Coronary disease status post non-ST elevation MI in May 2010 with  PCI of the left circumflex on Jun 24, 2008. She then had a PCI of  the RCA on July 15, 2008. 02/24/12 Cath, Severe 95% LAD, ostial 95% circ, ostial RCA, patent stents in distal RCA and mid circ normal LV 02/26/12 CABG with LIMA to LAD, SVG to RCA, and SVG to OM Dr. Roxan Hockey    . Cancer [facility])    adenocarcinoma of rectum  . Cataract   . Depression   . Diabetes mellitus, insulin dependent (IDDM), uncontrolled   . Heart murmur   . Hyperlipidemia   . Hypertension   . Hypertensive heart disease   . S/P radiation therapy 04/11/14-05/18/14   50.4Gy rectal  . Vertigo    Result of chemotherapy; has been to ENT and told nothing to be done    Objective: BP 128/64 (BP Location: Left Arm, Patient Position: Sitting, Cuff Size: Normal)   Pulse 71   Temp (!) 96.1 F (35.6 C) (Temporal)   Ht 5\' 2"  (1.575 m)   Wt 135 lb 8 oz (61.5 kg)   SpO2 96%   BMI 24.78 kg/m  General: Awake, appears stated age Heart: RRR, no lower extremity edema Lungs: CTAB, no rales, wheezes or rhonchi. No accessory muscle use Psych: Age appropriate  judgment and insight, normal affect and mood  Assessment and Plan: Type 2 diabetes mellitus with vascular disease (HCC)  Chronic right SI joint pain  Insomnia, unspecified type  1-counseled on diet and exercise.  I am pleasantly surprised she was able to decrease her insulin usage by over 60 units.  We will slightly increase her dosage to 22 units nightly and likely leave it there unless she starts having hypoglycemia or her sugar levels increase. 2-okay to continue tramadol as needed.  She is not taking it routinely and currently has no pain. 3-start low-dose of trazodone.  As she is not on tramadol routinely, less concern for serotonin syndrome.  Sleep hygiene information provided in addition to the [facility] behavioral health team contact information. It takes her 45 minutes to get to our office and she is going to transfer care elsewhere.  I gave her information for the [facility] primary care office and offered to follow-up in 6 weeks if she is not established by then. The patient voiced understanding and agreement to the plan.  [place], DO 05/31/19  3:31 PM

## 2019-05-31 NOTE — Patient Instructions (Addendum)
[facility]: [address] [place],  [address] [phone]  Call the gastroenterology team:  [phone] They may be able to schedule a virtual visit.  Sleep Hygiene Tips:  Do not watch TV or look at screens within 1 hour of going to bed. If you do, make sure there is a blue light filter (nighttime mode) involved.  Try to go to bed around the same time every night. Wake up at the same time within 1 hour of regular time. Ex: If you wake up at 7 AM for work, do not sleep past 8 AM on days that you don't work.  Do not drink alcohol before bedtime.  Do not consume caffeine-containing beverages after noon or within 9 hours of intended bedtime.  Get regular exercise/physical activity in your life, but not within 2 hours of planned bedtime.  Do not take naps.   Do not eat within 2 hours of planned bedtime.  Melatonin, 3-5 mg 30-60 minutes before planned bedtime may be helpful.   The bed should be for sleep or sex only. If after 20-30 minutes you are unable to fall asleep, get up and do something relaxing. Do this until you feel ready to go to sleep again.   Sleep is important to [place] all. Getting good sleep is imperative to adequate functioning during the day. Work with our counselors who are trained to help people obtain quality sleep. Call [phone] to schedule an appointment or if you are curious about insurance coverage/cost.  Let us know if you need anything.

## 2019-06-02 DIAGNOSIS — I2510 Atherosclerotic heart disease of native coronary artery without angina pectoris: Secondary | ICD-10-CM | POA: Diagnosis not present

## 2019-06-02 DIAGNOSIS — M533 Sacrococcygeal disorders, not elsewhere classified: Secondary | ICD-10-CM | POA: Diagnosis not present

## 2019-06-02 DIAGNOSIS — I471 Supraventricular tachycardia: Secondary | ICD-10-CM | POA: Diagnosis not present

## 2019-06-02 DIAGNOSIS — D649 Anemia, unspecified: Secondary | ICD-10-CM | POA: Diagnosis not present

## 2019-06-02 DIAGNOSIS — M25551 Pain in right hip: Secondary | ICD-10-CM | POA: Diagnosis not present

## 2019-06-02 DIAGNOSIS — G8929 Other chronic pain: Secondary | ICD-10-CM | POA: Diagnosis not present

## 2019-06-02 DIAGNOSIS — I10 Essential (primary) hypertension: Secondary | ICD-10-CM | POA: Diagnosis not present

## 2019-06-02 DIAGNOSIS — F418 Other specified anxiety disorders: Secondary | ICD-10-CM | POA: Diagnosis not present

## 2019-06-02 DIAGNOSIS — E11319 Type 2 diabetes mellitus with unspecified diabetic retinopathy without macular edema: Secondary | ICD-10-CM | POA: Diagnosis not present

## 2019-06-03 ENCOUNTER — Telehealth: Payer: Self-pay | Admitting: Family Medicine

## 2019-06-03 DIAGNOSIS — G8929 Other chronic pain: Secondary | ICD-10-CM | POA: Diagnosis not present

## 2019-06-03 DIAGNOSIS — I2510 Atherosclerotic heart disease of native coronary artery without angina pectoris: Secondary | ICD-10-CM | POA: Diagnosis not present

## 2019-06-03 DIAGNOSIS — E11319 Type 2 diabetes mellitus with unspecified diabetic retinopathy without macular edema: Secondary | ICD-10-CM | POA: Diagnosis not present

## 2019-06-03 DIAGNOSIS — I10 Essential (primary) hypertension: Secondary | ICD-10-CM | POA: Diagnosis not present

## 2019-06-03 DIAGNOSIS — M533 Sacrococcygeal disorders, not elsewhere classified: Secondary | ICD-10-CM | POA: Diagnosis not present

## 2019-06-03 DIAGNOSIS — D649 Anemia, unspecified: Secondary | ICD-10-CM | POA: Diagnosis not present

## 2019-06-03 DIAGNOSIS — F418 Other specified anxiety disorders: Secondary | ICD-10-CM | POA: Diagnosis not present

## 2019-06-03 DIAGNOSIS — I471 Supraventricular tachycardia: Secondary | ICD-10-CM | POA: Diagnosis not present

## 2019-06-03 DIAGNOSIS — M25551 Pain in right hip: Secondary | ICD-10-CM | POA: Diagnosis not present

## 2019-06-03 NOTE — Telephone Encounter (Signed)
Caller: Leontine Locket, Home Health Nurse  ([facility])  Patient's Call Back [phone]  methenamine (HIPREX) 1 g tablet [id number]  Per Home Nurse should patient continue to take medication above. No Refills listed on bottle.  Please Advise

## 2019-06-04 ENCOUNTER — Encounter: Payer: Self-pay | Admitting: Gastroenterology

## 2019-06-04 DIAGNOSIS — E11319 Type 2 diabetes mellitus with unspecified diabetic retinopathy without macular edema: Secondary | ICD-10-CM | POA: Diagnosis not present

## 2019-06-04 DIAGNOSIS — M25551 Pain in right hip: Secondary | ICD-10-CM | POA: Diagnosis not present

## 2019-06-04 DIAGNOSIS — I2510 Atherosclerotic heart disease of native coronary artery without angina pectoris: Secondary | ICD-10-CM | POA: Diagnosis not present

## 2019-06-04 DIAGNOSIS — G8929 Other chronic pain: Secondary | ICD-10-CM | POA: Diagnosis not present

## 2019-06-04 DIAGNOSIS — M533 Sacrococcygeal disorders, not elsewhere classified: Secondary | ICD-10-CM | POA: Diagnosis not present

## 2019-06-04 DIAGNOSIS — I471 Supraventricular tachycardia: Secondary | ICD-10-CM | POA: Diagnosis not present

## 2019-06-04 DIAGNOSIS — I10 Essential (primary) hypertension: Secondary | ICD-10-CM | POA: Diagnosis not present

## 2019-06-04 DIAGNOSIS — D649 Anemia, unspecified: Secondary | ICD-10-CM | POA: Diagnosis not present

## 2019-06-04 DIAGNOSIS — F418 Other specified anxiety disorders: Secondary | ICD-10-CM | POA: Diagnosis not present

## 2019-06-04 MED ORDER — METHENAMINE HIPPURATE 1 G PO TABS: ORAL_TABLET | ORAL | 1 refills | Status: DC

## 2019-06-10 DIAGNOSIS — I2510 Atherosclerotic heart disease of native coronary artery without angina pectoris: Secondary | ICD-10-CM | POA: Diagnosis not present

## 2019-06-10 DIAGNOSIS — E11319 Type 2 diabetes mellitus with unspecified diabetic retinopathy without macular edema: Secondary | ICD-10-CM | POA: Diagnosis not present

## 2019-06-10 DIAGNOSIS — I10 Essential (primary) hypertension: Secondary | ICD-10-CM | POA: Diagnosis not present

## 2019-06-10 DIAGNOSIS — M25551 Pain in right hip: Secondary | ICD-10-CM | POA: Diagnosis not present

## 2019-06-10 DIAGNOSIS — M533 Sacrococcygeal disorders, not elsewhere classified: Secondary | ICD-10-CM | POA: Diagnosis not present

## 2019-06-10 DIAGNOSIS — D649 Anemia, unspecified: Secondary | ICD-10-CM | POA: Diagnosis not present

## 2019-06-10 DIAGNOSIS — I471 Supraventricular tachycardia: Secondary | ICD-10-CM | POA: Diagnosis not present

## 2019-06-10 DIAGNOSIS — F418 Other specified anxiety disorders: Secondary | ICD-10-CM | POA: Diagnosis not present

## 2019-06-10 DIAGNOSIS — G8929 Other chronic pain: Secondary | ICD-10-CM | POA: Diagnosis not present

## 2019-06-16 DIAGNOSIS — I2510 Atherosclerotic heart disease of native coronary artery without angina pectoris: Secondary | ICD-10-CM | POA: Diagnosis not present

## 2019-06-16 DIAGNOSIS — E11319 Type 2 diabetes mellitus with unspecified diabetic retinopathy without macular edema: Secondary | ICD-10-CM | POA: Diagnosis not present

## 2019-06-16 DIAGNOSIS — I10 Essential (primary) hypertension: Secondary | ICD-10-CM | POA: Diagnosis not present

## 2019-06-16 DIAGNOSIS — I471 Supraventricular tachycardia: Secondary | ICD-10-CM | POA: Diagnosis not present

## 2019-06-16 DIAGNOSIS — M25551 Pain in right hip: Secondary | ICD-10-CM | POA: Diagnosis not present

## 2019-06-16 DIAGNOSIS — M533 Sacrococcygeal disorders, not elsewhere classified: Secondary | ICD-10-CM | POA: Diagnosis not present

## 2019-06-16 DIAGNOSIS — G8929 Other chronic pain: Secondary | ICD-10-CM | POA: Diagnosis not present

## 2019-06-16 DIAGNOSIS — D649 Anemia, unspecified: Secondary | ICD-10-CM | POA: Diagnosis not present

## 2019-06-16 DIAGNOSIS — F418 Other specified anxiety disorders: Secondary | ICD-10-CM | POA: Diagnosis not present

## 2019-06-17 DIAGNOSIS — I2510 Atherosclerotic heart disease of native coronary artery without angina pectoris: Secondary | ICD-10-CM | POA: Diagnosis not present

## 2019-06-17 DIAGNOSIS — M533 Sacrococcygeal disorders, not elsewhere classified: Secondary | ICD-10-CM | POA: Diagnosis not present

## 2019-06-17 DIAGNOSIS — E11319 Type 2 diabetes mellitus with unspecified diabetic retinopathy without macular edema: Secondary | ICD-10-CM | POA: Diagnosis not present

## 2019-06-17 DIAGNOSIS — M25551 Pain in right hip: Secondary | ICD-10-CM | POA: Diagnosis not present

## 2019-06-17 DIAGNOSIS — G8929 Other chronic pain: Secondary | ICD-10-CM | POA: Diagnosis not present

## 2019-06-17 DIAGNOSIS — D649 Anemia, unspecified: Secondary | ICD-10-CM | POA: Diagnosis not present

## 2019-06-17 DIAGNOSIS — I471 Supraventricular tachycardia: Secondary | ICD-10-CM | POA: Diagnosis not present

## 2019-06-17 DIAGNOSIS — F418 Other specified anxiety disorders: Secondary | ICD-10-CM | POA: Diagnosis not present

## 2019-06-17 DIAGNOSIS — I10 Essential (primary) hypertension: Secondary | ICD-10-CM | POA: Diagnosis not present

## 2019-06-18 DIAGNOSIS — M533 Sacrococcygeal disorders, not elsewhere classified: Secondary | ICD-10-CM | POA: Diagnosis not present

## 2019-06-18 DIAGNOSIS — I10 Essential (primary) hypertension: Secondary | ICD-10-CM | POA: Diagnosis not present

## 2019-06-18 DIAGNOSIS — G8929 Other chronic pain: Secondary | ICD-10-CM | POA: Diagnosis not present

## 2019-06-18 DIAGNOSIS — F418 Other specified anxiety disorders: Secondary | ICD-10-CM | POA: Diagnosis not present

## 2019-06-18 DIAGNOSIS — I2510 Atherosclerotic heart disease of native coronary artery without angina pectoris: Secondary | ICD-10-CM | POA: Diagnosis not present

## 2019-06-18 DIAGNOSIS — D649 Anemia, unspecified: Secondary | ICD-10-CM | POA: Diagnosis not present

## 2019-06-18 DIAGNOSIS — I471 Supraventricular tachycardia: Secondary | ICD-10-CM | POA: Diagnosis not present

## 2019-06-18 DIAGNOSIS — E11319 Type 2 diabetes mellitus with unspecified diabetic retinopathy without macular edema: Secondary | ICD-10-CM | POA: Diagnosis not present

## 2019-06-18 DIAGNOSIS — M25551 Pain in right hip: Secondary | ICD-10-CM | POA: Diagnosis not present

## 2019-06-21 ENCOUNTER — Other Ambulatory Visit: Payer: Self-pay | Admitting: Family Medicine

## 2019-06-21 ENCOUNTER — Other Ambulatory Visit: Payer: Self-pay | Admitting: Hematology & Oncology

## 2019-06-21 DIAGNOSIS — F418 Other specified anxiety disorders: Secondary | ICD-10-CM

## 2019-06-21 MED ORDER — ESCITALOPRAM OXALATE 10 MG PO TABS: 10.0000 mg | ORAL_TABLET | Freq: Every day | ORAL | 2 refills | Status: DC

## 2019-06-21 MED ORDER — ARIPIPRAZOLE 10 MG PO TABS: 10.0000 mg | ORAL_TABLET | Freq: Every day | ORAL | 2 refills | Status: DC

## 2019-06-24 ENCOUNTER — Ambulatory Visit: Payer: Medicare HMO | Admitting: Medical-Surgical

## 2019-06-30 DIAGNOSIS — E11319 Type 2 diabetes mellitus with unspecified diabetic retinopathy without macular edema: Secondary | ICD-10-CM | POA: Diagnosis not present

## 2019-06-30 DIAGNOSIS — I10 Essential (primary) hypertension: Secondary | ICD-10-CM | POA: Diagnosis not present

## 2019-06-30 DIAGNOSIS — I2510 Atherosclerotic heart disease of native coronary artery without angina pectoris: Secondary | ICD-10-CM | POA: Diagnosis not present

## 2019-06-30 DIAGNOSIS — I471 Supraventricular tachycardia: Secondary | ICD-10-CM | POA: Diagnosis not present

## 2019-06-30 DIAGNOSIS — M25551 Pain in right hip: Secondary | ICD-10-CM | POA: Diagnosis not present

## 2019-06-30 DIAGNOSIS — D649 Anemia, unspecified: Secondary | ICD-10-CM | POA: Diagnosis not present

## 2019-06-30 DIAGNOSIS — G8929 Other chronic pain: Secondary | ICD-10-CM | POA: Diagnosis not present

## 2019-06-30 DIAGNOSIS — M533 Sacrococcygeal disorders, not elsewhere classified: Secondary | ICD-10-CM | POA: Diagnosis not present

## 2019-06-30 DIAGNOSIS — F418 Other specified anxiety disorders: Secondary | ICD-10-CM | POA: Diagnosis not present

## 2019-07-01 ENCOUNTER — Telehealth: Payer: Self-pay | Admitting: Family Medicine

## 2019-07-01 DIAGNOSIS — M25551 Pain in right hip: Secondary | ICD-10-CM | POA: Diagnosis not present

## 2019-07-01 DIAGNOSIS — G8929 Other chronic pain: Secondary | ICD-10-CM | POA: Diagnosis not present

## 2019-07-01 DIAGNOSIS — I10 Essential (primary) hypertension: Secondary | ICD-10-CM | POA: Diagnosis not present

## 2019-07-01 DIAGNOSIS — D649 Anemia, unspecified: Secondary | ICD-10-CM | POA: Diagnosis not present

## 2019-07-01 DIAGNOSIS — M533 Sacrococcygeal disorders, not elsewhere classified: Secondary | ICD-10-CM | POA: Diagnosis not present

## 2019-07-01 DIAGNOSIS — E11319 Type 2 diabetes mellitus with unspecified diabetic retinopathy without macular edema: Secondary | ICD-10-CM | POA: Diagnosis not present

## 2019-07-01 DIAGNOSIS — I471 Supraventricular tachycardia: Secondary | ICD-10-CM | POA: Diagnosis not present

## 2019-07-01 DIAGNOSIS — F418 Other specified anxiety disorders: Secondary | ICD-10-CM | POA: Diagnosis not present

## 2019-07-01 DIAGNOSIS — I2510 Atherosclerotic heart disease of native coronary artery without angina pectoris: Secondary | ICD-10-CM | POA: Diagnosis not present

## 2019-07-01 NOTE — Telephone Encounter (Signed)
Caller: Leontine Locket ([facility] Nurse) Call back phone number: [phone]  Ria Comment is calling to let you know patient blood sugar is Mid\Low 100, the patient has not been using Lantus 80 units.

## 2019-07-02 NOTE — Telephone Encounter (Signed)
She is rx'd 22 u qhs. Ty.

## 2019-07-02 NOTE — Telephone Encounter (Signed)
RN wanted PCP to know the patient has not been taking her Lantus at all and only taking Metformin. Blood sugar's have been good in the low 100's

## 2019-07-02 NOTE — Addendum Note (Signed)
Addended by: Ames Coupe on: 07/02/2019 12:08 PM   Modules accepted: Orders

## 2019-07-02 NOTE — Telephone Encounter (Signed)
Noted. Will remove Lantus from med list. Ty.

## 2019-07-08 ENCOUNTER — Telehealth: Payer: Self-pay | Admitting: Family Medicine

## 2019-07-08 DIAGNOSIS — F418 Other specified anxiety disorders: Secondary | ICD-10-CM

## 2019-07-08 NOTE — Telephone Encounter (Signed)
Medication:  metFORMIN (GLUCOPHAGE) 1000 MG tablet [id number]   Has the patient contacted their pharmacy? No. (If no, request that the patient contact the pharmacy for the refill.) (If yes, when and what did the pharmacy advise?)  Preferred Pharmacy (with phone number or street name):  [facility] [place] - [address]  [place], [place] [address]  Phone:  [phone] Fax:  [phone]  DEA #:  [id number]   Agent: Please be advised that RX refills may take up to 3 business days. We ask that you follow-up with your pharmacy.

## 2019-07-09 MED ORDER — ESCITALOPRAM OXALATE 10 MG PO TABS: 10.0000 mg | ORAL_TABLET | Freq: Every day | ORAL | 2 refills | Status: DC

## 2019-07-09 MED ORDER — METFORMIN HCL 1000 MG PO TABS: ORAL_TABLET | ORAL | 0 refills | Status: DC

## 2019-07-13 DIAGNOSIS — D649 Anemia, unspecified: Secondary | ICD-10-CM | POA: Diagnosis not present

## 2019-07-13 DIAGNOSIS — I471 Supraventricular tachycardia: Secondary | ICD-10-CM | POA: Diagnosis not present

## 2019-07-13 DIAGNOSIS — I10 Essential (primary) hypertension: Secondary | ICD-10-CM | POA: Diagnosis not present

## 2019-07-13 DIAGNOSIS — E11319 Type 2 diabetes mellitus with unspecified diabetic retinopathy without macular edema: Secondary | ICD-10-CM | POA: Diagnosis not present

## 2019-07-13 DIAGNOSIS — M25551 Pain in right hip: Secondary | ICD-10-CM | POA: Diagnosis not present

## 2019-07-13 DIAGNOSIS — F418 Other specified anxiety disorders: Secondary | ICD-10-CM | POA: Diagnosis not present

## 2019-07-13 DIAGNOSIS — M533 Sacrococcygeal disorders, not elsewhere classified: Secondary | ICD-10-CM | POA: Diagnosis not present

## 2019-07-13 DIAGNOSIS — I2510 Atherosclerotic heart disease of native coronary artery without angina pectoris: Secondary | ICD-10-CM | POA: Diagnosis not present

## 2019-07-13 DIAGNOSIS — G8929 Other chronic pain: Secondary | ICD-10-CM | POA: Diagnosis not present

## 2019-07-14 DIAGNOSIS — E11319 Type 2 diabetes mellitus with unspecified diabetic retinopathy without macular edema: Secondary | ICD-10-CM | POA: Diagnosis not present

## 2019-07-14 DIAGNOSIS — I471 Supraventricular tachycardia: Secondary | ICD-10-CM | POA: Diagnosis not present

## 2019-07-14 DIAGNOSIS — G8929 Other chronic pain: Secondary | ICD-10-CM | POA: Diagnosis not present

## 2019-07-14 DIAGNOSIS — F418 Other specified anxiety disorders: Secondary | ICD-10-CM | POA: Diagnosis not present

## 2019-07-14 DIAGNOSIS — M25551 Pain in right hip: Secondary | ICD-10-CM | POA: Diagnosis not present

## 2019-07-14 DIAGNOSIS — D649 Anemia, unspecified: Secondary | ICD-10-CM | POA: Diagnosis not present

## 2019-07-14 DIAGNOSIS — I10 Essential (primary) hypertension: Secondary | ICD-10-CM | POA: Diagnosis not present

## 2019-07-14 DIAGNOSIS — I2510 Atherosclerotic heart disease of native coronary artery without angina pectoris: Secondary | ICD-10-CM | POA: Diagnosis not present

## 2019-07-14 DIAGNOSIS — M533 Sacrococcygeal disorders, not elsewhere classified: Secondary | ICD-10-CM | POA: Diagnosis not present

## 2019-07-15 ENCOUNTER — Telehealth: Payer: Self-pay | Admitting: Family Medicine

## 2019-07-15 NOTE — Telephone Encounter (Signed)
Spoke with [place] , VO given

## 2019-07-15 NOTE — Telephone Encounter (Signed)
Called and lvm for a return call

## 2019-07-15 NOTE — Telephone Encounter (Signed)
OK 

## 2019-07-15 NOTE — Telephone Encounter (Signed)
Okay to give VO ? °

## 2019-07-15 NOTE — Telephone Encounter (Signed)
Caller/Agency: [facility] ([facility])  Callback Number:[phone]  Requesting OT/PT/Skilled Nursing/Social Work/Speech Therapy: OT  Frequency: 1 time a week for 8 week  Verbal order need it.

## 2019-07-16 DIAGNOSIS — M25551 Pain in right hip: Secondary | ICD-10-CM | POA: Diagnosis not present

## 2019-07-16 DIAGNOSIS — E11319 Type 2 diabetes mellitus with unspecified diabetic retinopathy without macular edema: Secondary | ICD-10-CM | POA: Diagnosis not present

## 2019-07-16 DIAGNOSIS — M533 Sacrococcygeal disorders, not elsewhere classified: Secondary | ICD-10-CM | POA: Diagnosis not present

## 2019-07-16 DIAGNOSIS — D649 Anemia, unspecified: Secondary | ICD-10-CM | POA: Diagnosis not present

## 2019-07-16 DIAGNOSIS — I2510 Atherosclerotic heart disease of native coronary artery without angina pectoris: Secondary | ICD-10-CM | POA: Diagnosis not present

## 2019-07-16 DIAGNOSIS — I10 Essential (primary) hypertension: Secondary | ICD-10-CM | POA: Diagnosis not present

## 2019-07-16 DIAGNOSIS — I471 Supraventricular tachycardia: Secondary | ICD-10-CM | POA: Diagnosis not present

## 2019-07-16 DIAGNOSIS — F418 Other specified anxiety disorders: Secondary | ICD-10-CM | POA: Diagnosis not present

## 2019-07-16 DIAGNOSIS — G8929 Other chronic pain: Secondary | ICD-10-CM | POA: Diagnosis not present

## 2019-07-19 ENCOUNTER — Other Ambulatory Visit: Payer: Self-pay

## 2019-07-19 ENCOUNTER — Ambulatory Visit (AMBULATORY_SURGERY_CENTER): Payer: Self-pay | Admitting: *Deleted

## 2019-07-19 VITALS — Ht 60.0 in | Wt 134.0 lb

## 2019-07-19 DIAGNOSIS — Z86010 Personal history of colonic polyps: Secondary | ICD-10-CM

## 2019-07-19 DIAGNOSIS — Z01818 Encounter for other preprocedural examination: Secondary | ICD-10-CM

## 2019-07-19 MED ORDER — SUPREP BOWEL PREP KIT 17.5-3.13-1.6 GM/177ML PO SOLN: ORAL | 0 refills | Status: DC

## 2019-07-19 NOTE — Progress Notes (Signed)
covid test 07-29-19 at 2:30 pm  Pt is aware that care partner will wait in the car during procedure; if they feel like they will be too hot or cold to wait in the car; they may wait in the 4 th floor lobby. Patient is aware to bring only one care partner. We want them to wear a mask (we do not have any that we can provide them), practice social distancing, and we will check their temperatures when they get here.  I did remind the patient that their care partner needs to stay in the parking lot the entire time and have a cell phone available, we will call them when the pt is ready for discharge. Patient will wear mask into building.   No trouble with anesthesia, difficulty with intubation or hx/fam hx of malignant hyperthermia per pt   No egg or soy allergy  No home oxygen use   No medications for weight loss taken  emmi information given  Pt denies constipation issues

## 2019-07-23 DIAGNOSIS — M25551 Pain in right hip: Secondary | ICD-10-CM | POA: Diagnosis not present

## 2019-07-23 DIAGNOSIS — I471 Supraventricular tachycardia: Secondary | ICD-10-CM | POA: Diagnosis not present

## 2019-07-23 DIAGNOSIS — M533 Sacrococcygeal disorders, not elsewhere classified: Secondary | ICD-10-CM | POA: Diagnosis not present

## 2019-07-23 DIAGNOSIS — E11319 Type 2 diabetes mellitus with unspecified diabetic retinopathy without macular edema: Secondary | ICD-10-CM | POA: Diagnosis not present

## 2019-07-23 DIAGNOSIS — I2510 Atherosclerotic heart disease of native coronary artery without angina pectoris: Secondary | ICD-10-CM | POA: Diagnosis not present

## 2019-07-23 DIAGNOSIS — G8929 Other chronic pain: Secondary | ICD-10-CM | POA: Diagnosis not present

## 2019-07-23 DIAGNOSIS — I10 Essential (primary) hypertension: Secondary | ICD-10-CM | POA: Diagnosis not present

## 2019-07-23 DIAGNOSIS — D649 Anemia, unspecified: Secondary | ICD-10-CM | POA: Diagnosis not present

## 2019-07-23 DIAGNOSIS — F418 Other specified anxiety disorders: Secondary | ICD-10-CM | POA: Diagnosis not present

## 2019-07-29 ENCOUNTER — Other Ambulatory Visit: Payer: Self-pay | Admitting: Gastroenterology

## 2019-07-29 ENCOUNTER — Ambulatory Visit (INDEPENDENT_AMBULATORY_CARE_PROVIDER_SITE_OTHER): Payer: Medicare HMO

## 2019-07-29 DIAGNOSIS — Z1159 Encounter for screening for other viral diseases: Secondary | ICD-10-CM

## 2019-07-30 ENCOUNTER — Telehealth: Payer: Self-pay | Admitting: Gastroenterology

## 2019-07-30 DIAGNOSIS — E11319 Type 2 diabetes mellitus with unspecified diabetic retinopathy without macular edema: Secondary | ICD-10-CM | POA: Diagnosis not present

## 2019-07-30 DIAGNOSIS — M533 Sacrococcygeal disorders, not elsewhere classified: Secondary | ICD-10-CM | POA: Diagnosis not present

## 2019-07-30 DIAGNOSIS — F418 Other specified anxiety disorders: Secondary | ICD-10-CM | POA: Diagnosis not present

## 2019-07-30 DIAGNOSIS — I471 Supraventricular tachycardia: Secondary | ICD-10-CM | POA: Diagnosis not present

## 2019-07-30 DIAGNOSIS — I2510 Atherosclerotic heart disease of native coronary artery without angina pectoris: Secondary | ICD-10-CM | POA: Diagnosis not present

## 2019-07-30 DIAGNOSIS — I10 Essential (primary) hypertension: Secondary | ICD-10-CM | POA: Diagnosis not present

## 2019-07-30 DIAGNOSIS — M25551 Pain in right hip: Secondary | ICD-10-CM | POA: Diagnosis not present

## 2019-07-30 DIAGNOSIS — G8929 Other chronic pain: Secondary | ICD-10-CM | POA: Diagnosis not present

## 2019-07-30 DIAGNOSIS — D649 Anemia, unspecified: Secondary | ICD-10-CM | POA: Diagnosis not present

## 2019-07-30 LAB — SARS CORONAVIRUS 2 (TAT 6-24 HRS): SARS Coronavirus 2: NEGATIVE

## 2019-07-30 NOTE — Telephone Encounter (Signed)
Reviewed with pt that she should drink regular soda, juice, etc...  Not diet or low calorie.  Encouraged her to make a can of chicken noodle soup, drain the noodles and drink the broth only.  Encouraged her to drink as much as she needed to up to until her NPO time of 1030.  Also, reminded her not to take her Metformin the day of her procedure.  Understanding voiced

## 2019-08-02 ENCOUNTER — Other Ambulatory Visit: Payer: Self-pay

## 2019-08-02 ENCOUNTER — Encounter: Payer: Self-pay | Admitting: Gastroenterology

## 2019-08-02 ENCOUNTER — Ambulatory Visit (AMBULATORY_SURGERY_CENTER): Payer: Medicare HMO | Admitting: Gastroenterology

## 2019-08-02 VITALS — BP 126/55 | HR 67 | Temp 97.8°F | Resp 16 | Ht 60.0 in | Wt 134.0 lb

## 2019-08-02 DIAGNOSIS — I2510 Atherosclerotic heart disease of native coronary artery without angina pectoris: Secondary | ICD-10-CM | POA: Diagnosis not present

## 2019-08-02 DIAGNOSIS — Z86010 Personal history of colonic polyps: Secondary | ICD-10-CM | POA: Diagnosis not present

## 2019-08-02 DIAGNOSIS — Z85048 Personal history of other malignant neoplasm of rectum, rectosigmoid junction, and anus: Secondary | ICD-10-CM

## 2019-08-02 DIAGNOSIS — I10 Essential (primary) hypertension: Secondary | ICD-10-CM | POA: Diagnosis not present

## 2019-08-02 DIAGNOSIS — E119 Type 2 diabetes mellitus without complications: Secondary | ICD-10-CM | POA: Diagnosis not present

## 2019-08-02 MED ORDER — SODIUM CHLORIDE 0.9 % IV SOLN
500.0000 mL | Freq: Once | INTRAVENOUS | Status: DC
Start: 2019-08-02 — End: 2019-08-02

## 2019-08-02 NOTE — Progress Notes (Signed)
Report to PACU, RN, vss, BBS= Clear.  

## 2019-08-02 NOTE — Patient Instructions (Signed)
Impression/Recommendations:  Diverticulosis handout given to patient.  Repeat colonoscopy in 3 years for surveillance.  Resume previous diet. Continue present medications.  YOU HAD AN ENDOSCOPIC PROCEDURE TODAY AT [facility]:   Refer to the procedure report that was given to you for any specific questions about what was found during the examination.  If the procedure report does not answer your questions, please call your gastroenterologist to clarify.  If you requested that your care partner not be given the details of your procedure findings, then the procedure report has been included in a sealed envelope for you to review at your convenience later.  YOU SHOULD EXPECT: Some feelings of bloating in the abdomen. Passage of more gas than usual.  Walking can help get rid of the air that was put into your GI tract during the procedure and reduce the bloating. If you had a lower endoscopy (such as a colonoscopy or flexible sigmoidoscopy) you may notice spotting of blood in your stool or on the toilet paper. If you underwent a bowel prep for your procedure, you may not have a normal bowel movement for a few days.  Please Note:  You might notice some irritation and congestion in your nose or some drainage.  This is from the oxygen used during your procedure.  There is no need for concern and it should clear up in a day or so.  SYMPTOMS TO REPORT IMMEDIATELY:   Following lower endoscopy (colonoscopy or flexible sigmoidoscopy):  Excessive amounts of blood in the stool  Significant tenderness or worsening of abdominal pains  Swelling of the abdomen that is new, acute  Fever of 100F or higher  For urgent or emergent issues, a gastroenterologist can be reached at any hour by calling [phone]. Do not use MyChart messaging for urgent concerns.    DIET:  We do recommend a small meal at first, but then you may proceed to your regular diet.  Drink plenty of fluids but you should  avoid alcoholic beverages for 24 hours.  ACTIVITY:  You should plan to take it easy for the rest of today and you should NOT DRIVE or use heavy machinery until tomorrow (because of the sedation medicines used during the test).    FOLLOW UP: Our staff will call the number listed on your records 48-72 hours following your procedure to check on you and address any questions or concerns that you may have regarding the information given to you following your procedure. If we do not reach you, we will leave a message.  We will attempt to reach you two times.  During this call, we will ask if you have developed any symptoms of COVID 19. If you develop any symptoms (ie: fever, flu-like symptoms, shortness of breath, cough etc.) before then, please call [phone].  If you test positive for Covid 19 in the 2 weeks post procedure, please call and report this information to [place].    If any biopsies were taken you will be contacted by phone or by letter within the next 1-3 weeks.  Please call us at [phone] if you have not heard about the biopsies in 3 weeks.    SIGNATURES/CONFIDENTIALITY: You and/or your care partner have signed paperwork which will be entered into your electronic medical record.  These signatures attest to the fact that that the information above on your After Visit Summary has been reviewed and is understood.  Full responsibility of the confidentiality of this discharge information lies with  you and/or your care-partner.

## 2019-08-02 NOTE — Progress Notes (Signed)
Pt's states no medical or surgical changes since previsit or office visit. 

## 2019-08-02 NOTE — Progress Notes (Signed)
Pt. Doing well, alert, denies any dizzy/lightheadedness in RR.  Pt. Moves slowing, so time elapsed until D/C reflects pt.'s moving more slowly.

## 2019-08-02 NOTE — Op Note (Signed)
[facility] Patient Name: [name] Procedure Date: 08/02/2019 1:32 PM MRN: [id number] Endoscopist: Ladene Artist , MD Age: 69 Referring MD:  Date of Birth: Oct 15, 1950 Gender: Female Account #: [id number] Procedure:                Colonoscopy Indications:              High risk colon cancer surveillance: Personal                            history of colon cancer Medicines:                Monitored Anesthesia Care Procedure:                Pre-Anesthesia Assessment:                           - Prior to the procedure, a History and Physical                            was performed, and patient medications and                            allergies were reviewed. The patient's tolerance of                            previous anesthesia was also reviewed. The risks                            and benefits of the procedure and the sedation                            options and risks were discussed with the patient.                            All questions were answered, and informed consent                            was obtained. Prior Anticoagulants: The patient has                            taken no previous anticoagulant or antiplatelet                            agents. ASA Grade Assessment: II - A patient with                            mild systemic disease. After reviewing the risks                            and benefits, the patient was deemed in                            satisfactory condition to undergo the procedure.  After obtaining informed consent, the colonoscope                            was passed under direct vision. Throughout the                            procedure, the patient's blood pressure, pulse, and                            oxygen saturations were monitored continuously. The                            Colonoscope was introduced through the anus and                            advanced to the the ileocolonic  anastomosis. The                            rectum was photographed. The quality of the bowel                            preparation was good. The colonoscopy was performed                            without difficulty. Unable to safely retroflex due                            to the rectal anastomosis. The patient tolerated                            the procedure well. Scope In: 1:38:58 PM Scope Out: 1:46:26 PM Scope Withdrawal Time: 0 hours 5 minutes 23 seconds  Total Procedure Duration: 0 hours 7 minutes 28 seconds  Findings:                 The perianal and digital rectal examinations were                            normal.                           Scattered small-mouthed diverticula were found in                            the sigmoid colon, descending colon and transverse                            colon.                           There was evidence of a prior end-to-side                            ileo-colonic anastomosis in the transverse colon.  This was patent and was characterized by healthy                            appearing mucosa. The anastomosis was not                            traversed. The colon extended 15 cm beyond the                            ileo-colonic asastomosis.                           There was evidence of a prior end-to-end                            colo-colonic anastomosis in the rectum. This was                            patent and was characterized by healthy appearing                            mucosa. The anastomosis was traversed.                           A tattoo was seen in the rectum. The tattoo site                            appeared normal.                           The exam was otherwise without abnormality on                            direct views. Complications:            No immediate complications. Estimated blood loss:                            None. Estimated Blood Loss:     Estimated blood loss:  none. Impression:               - Diverticulosis in the sigmoid colon, in the                            descending colon and in the transverse colon.                           - Patent end-to-side ileo-colonic anastomosis,                            characterized by healthy appearing mucosa.                           - Patent end-to-end colo-colonic anastomosis,  characterized by healthy appearing mucosa.                           - A tattoo was seen in the rectum. The tattoo site                            appeared normal.                           - The examination was otherwise normal on direct                            views.                           - No specimens collected. Recommendation:           - Repeat colonoscopy in 3 years for surveillance.                           - Patient has a contact number available for                            emergencies. The signs and symptoms of potential                            delayed complications were discussed with the                            patient. Return to normal activities tomorrow.                            Written discharge instructions were provided to the                            patient.                           - Resume previous diet.                           - Continue present medications. Ladene Artist, MD 08/02/2019 1:54:19 PM This report has been signed electronically.

## 2019-08-04 ENCOUNTER — Telehealth: Payer: Self-pay

## 2019-08-04 NOTE — Telephone Encounter (Signed)
°  Follow up Call-  Call back number 08/02/2019 07/01/2017  Post procedure Call Back phone  # [phone]  Permission to leave phone message Yes Yes  Some recent data might be hidden     Patient questions:  Do you have a fever, pain , or abdominal swelling? No. Pain Score  0 *  Have you tolerated food without any problems? Yes.    Have you been able to return to your normal activities? Yes.    Do you have any questions about your discharge instructions: Diet   No. Medications  No. Follow up visit  No.  Do you have questions or concerns about your Care? No.  Actions: * If pain score is 4 or above: No action needed, pain <4.  1. Have you developed a fever since your procedure? no  2.   Have you had an respiratory symptoms (SOB or cough) since your procedure? no  3.   Have you tested positive for COVID 19 since your procedure no  4.   Have you had any family members/close contacts diagnosed with the COVID 19 since your procedure?  no   If yes to any of these questions please route to Joylene John, RN and Erenest Rasher, RN

## 2019-08-06 ENCOUNTER — Telehealth: Payer: Self-pay | Admitting: Family Medicine

## 2019-08-06 NOTE — Telephone Encounter (Signed)
Occupational therapist states  , patient missed assessment today. Wanted to inform [facility]

## 2019-08-10 ENCOUNTER — Ambulatory Visit (INDEPENDENT_AMBULATORY_CARE_PROVIDER_SITE_OTHER): Payer: Medicare HMO | Admitting: Medical-Surgical

## 2019-08-10 ENCOUNTER — Encounter: Payer: Self-pay | Admitting: Medical-Surgical

## 2019-08-10 VITALS — BP 105/70 | HR 75 | Temp 98.1°F | Ht 59.0 in | Wt 130.3 lb

## 2019-08-10 DIAGNOSIS — E559 Vitamin D deficiency, unspecified: Secondary | ICD-10-CM

## 2019-08-10 DIAGNOSIS — I119 Hypertensive heart disease without heart failure: Secondary | ICD-10-CM | POA: Diagnosis not present

## 2019-08-10 DIAGNOSIS — E785 Hyperlipidemia, unspecified: Secondary | ICD-10-CM

## 2019-08-10 DIAGNOSIS — G4700 Insomnia, unspecified: Secondary | ICD-10-CM | POA: Diagnosis not present

## 2019-08-10 DIAGNOSIS — M47818 Spondylosis without myelopathy or radiculopathy, sacral and sacrococcygeal region: Secondary | ICD-10-CM | POA: Diagnosis not present

## 2019-08-10 DIAGNOSIS — E1159 Type 2 diabetes mellitus with other circulatory complications: Secondary | ICD-10-CM | POA: Diagnosis not present

## 2019-08-10 DIAGNOSIS — F418 Other specified anxiety disorders: Secondary | ICD-10-CM

## 2019-08-10 MED ORDER — TRAMADOL HCL 50 MG PO TABS: 50.0000 mg | ORAL_TABLET | Freq: Three times a day (TID) | ORAL | 0 refills | Status: DC | PRN

## 2019-08-10 NOTE — Progress Notes (Signed)
New Patient Office Visit  Subjective:  Patient ID: [name], female    DOB: 02/01/1951  Age: 69 y.o. MRN: [id number]  CC:  Chief Complaint  Patient presents with  . Establish Care    HPI [name] presents to establish care. She was previously seeing Dr. Nani Ravens but wants to have a provider that is closer to home as she no longer drives herself to her appointments.   DM- previously insulin dependent but has been able to reduce her A1c and control her sugars enough to come off of it. Currently taking Metformin 1g BID with meals. Checks her sugars once daily with average readings in the 110s.  Insomnia- Has difficulty falling asleep at night due to anxiety. Falls asleep around 3am and wakes around 8am. Feel rested overall but notes that she does wake twice nightly to go to the bathroom. Taking Trazodone 24m nightly and does not feel this is very helpful. Has tried OTC sleep medications in the past and doesn't fully remember if they were helpful.  Anxiety/depression- currently taking Lexapro 12mdaily and hydroxyzine 2546mID as needed. Average use of hydroxyzine 1-2 times daily. Feels that her anxiety is related to getting old and being unable to do things like she used to. Lives with her son but he is a truAdministratord is often gone. Does not drive anymore, reports her driving "scares her". Also has difficulty with hearing impairment, especially since her one hearing aid is being repaired. Has stopped going to church because she could not hear the sermons and was bothered by it. Endorses that if she just had something to do to keep her busy, she would not be so anxious and depressed. Does not have reliable transportation to allow for hobbies or volunteer activities. Is open to counseling but isn't sure if this would help her.   Right hip/SI arthritis- Long history of right hip and right SI arthritis. Taking Tramadol 60m32m needed for pain. Does not use this every day but reports  she takes at least one per week. Had  Her bottle of pills with her from April 23rd. Originally a 30 tablet prescription, she has 5 or 6 tabs left.   HTN- Currently taking Lopressor 25mg77m. Denies chest pain, shortness of breath, palpitations, lower extremity edema, headaches, and dizziness.   Vitamin D deficiency- history of vitamin D deficiency. Previously treated with high dose Vitamin D weekly. Unsure if she is still taking this as she did not bring her medications with her to the appointment.   Past Medical History:  Diagnosis Date  .  Paroxysmal SVT (ANVRT)    S/p AV nodal ablation Dr. TayloLovena Le/10   . Anxiety   . CAD    Coronary disease status post non-ST elevation MI in May 2010 with  PCI of the left circumflex on Jun 24, 2008. She then had a PCI of  the RCA on July 15, 2008. 02/24/12 Cath, Severe 95% LAD, ostial 95% circ, ostial RCA, patent stents in distal RCA and mid circ normal LV 02/26/12 CABG with LIMA to LAD, SVG to RCA, and SVG to OM Dr. HendrRoxan Hockey [facility]  . Cataract   . Depression   . Diabetes mellitus, insulin dependent (IDDM), uncontrolled   . Heart murmur   . Hyperlipidemia   . Hypertension   . Hypertensive heart disease   . S/P radiation therapy 04/11/14-05/18/14   50.4Gy rectal  . Vertigo  Result of chemotherapy; has been to ENT and told nothing to be done    Past Surgical History:  Procedure Laterality Date  . brain tumor removed     2019  . COLON SURGERY     2016  . COLONOSCOPY    . COLONOSCOPY WITH PROPOFOL N/A 07/12/2014   Procedure: COLONOSCOPY WITH PROPOFOL POSSIBLE POLYP RESECTION;  Surgeon: [name], MD;  Location: [facility] END[facility];  Service: Endoscopy;  Laterality: N/A;   to be admitted after colonoscopy-robotic surgery on 6/8  . CORONARY ANGIOPLASTY WITH STENT PLACEMENT    . CORONARY ARTERY BYPASS GRAFT  02/26/2012   Roxan Hockey, MD;  Location: [facility];  Service: Open Heart Surgery;  Laterality: N/A;  CABG x  three,  using left internal mammary artery  and left leg greater saphenous vein,   . ENDOVEIN HARVEST OF GREATER SAPHENOUS VEIN  02/26/2012  . EUS N/A 03/24/2014   Procedure: LOWER ENDOSCOPIC ULTRASOUND (EUS);  Surgeon: Milus Banister, MD;  Location: [facility];  Service: Endoscopy;  Laterality: N/A;  . LEFT HEART CATHETERIZATION WITH CORONARY ANGIOGRAM N/A 02/24/2012   Procedure: LEFT HEART CATHETERIZATION WITH CORONARY ANGIOGRAM;  Surgeon: Jacolyn Reedy, MD;  Location: [facility] CATH LAB;  Service: Cardiovascular;  Laterality: N/A;    Family History  Problem Relation Age of Onset  . Diabetes Father   . Diabetes Brother   . Alzheimer's disease Brother 53  . Diabetes Sister   . Colon polyps Maternal Grandmother   . Colon cancer Neg Hx   . Esophageal cancer Neg Hx   . Liver cancer Neg Hx   . Pancreatic cancer Neg Hx   . Rectal cancer Neg Hx   . Stomach cancer Neg Hx     Social History   Socioeconomic History  . Marital status: Divorced    Spouse name: Not on file  . Number of children: Not on file  . Years of education: Not on file  . Highest education level: Not on file  Occupational History  . Not on file  Tobacco Use  . Smoking status: Never Smoker  . Smokeless tobacco: Never Used  Vaping Use  . Vaping Use: Never used  Substance and Sexual Activity  . Alcohol use: No    Alcohol/week: 0.0 standard drinks  . Drug use: No  . Sexual activity: Not Currently  Other Topics Concern  . 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   Also has a dog   Enjoys reading, gardening, going to movies or just getting out with her sister   [employer] to church and bible study group on  Tuesdays.   Independent and drives   Says she copes with stress by prayer    Social Determinants of Health   Financial  Resource Strain:   . Difficulty of Paying Living Expenses:   Food Insecurity:   . Worried About Charity fundraiser in the Last Year:   . Arboriculturist in the Last Year:   Transportation Needs:   . Film/video editor (Medical):   Marland Kitchen Lack of Transportation (Non-Medical):   Physical Activity:   . Days of Exercise per Week:   . Minutes of Exercise per Session:   Stress:   . Feeling of Stress :  Social Connections:   . Frequency of Communication with Friends and Family:   . Frequency of Social Gatherings with Friends and Family:   . Attends Religious Services:   . Active Member of Clubs or Organizations:   . Attends Archivist Meetings:   Marland Kitchen Marital Status:   Intimate Partner Violence:   . Fear of Current or Ex-Partner:   . Emotionally Abused:   Marland Kitchen Physically Abused:   . Sexually Abused:     ROS Review of Systems  Constitutional: Positive for fatigue. Negative for chills and fever.  HENT: Positive for hearing loss (deaf in the right ear with moderate hearing loss in the left; hearing aid being repaired).   Respiratory: Negative for cough, chest tightness, shortness of breath and wheezing.   Cardiovascular: Negative for chest pain, palpitations and leg swelling.  Endocrine: Negative for polydipsia, polyphagia and polyuria.  Genitourinary:       Nocturia  Musculoskeletal: Positive for arthralgias.  Neurological: Negative for dizziness and light-headedness.  Psychiatric/Behavioral: Positive for dysphoric mood and sleep disturbance. Negative for self-injury and suicidal ideas. The patient is nervous/anxious.     Objective:   Today's Vitals: BP 105/70   Pulse 75   Temp 98.1 F (36.7 C) (Oral)   Ht 4' 11" (1.499 m)   Wt 130 lb 4.8 oz (59.1 kg)   LMP  (Exact Date)   SpO2 95%   BMI 26.32 kg/m   Physical Exam Vitals reviewed.  Constitutional:      General: She is not in acute distress.    Appearance: Normal appearance.  HENT:     Head: Normocephalic and  atraumatic.  Cardiovascular:     Rate and Rhythm: Normal rate and regular rhythm.     Pulses: Normal pulses.     Heart sounds: Normal heart sounds. No murmur heard.  No friction rub. No gallop.   Pulmonary:     Effort: Pulmonary effort is normal. No respiratory distress.     Breath sounds: Normal breath sounds. No wheezing.  Skin:    General: Skin is warm and dry.  Neurological:     Mental Status: She is alert and oriented to person, place, and time.  Psychiatric:        Mood and Affect: Mood normal.        Behavior: Behavior normal.        Thought Content: Thought content normal.        Judgment: Judgment normal.     Assessment & Plan:   1. Hypertensive heart disease without heart failure Continue Lopressor BID. Currently at goal and stable.  2. Type 2 diabetes mellitus with vascular disease ([place]) Checking Hemoglobin A1c. Continue Metformin 1g BID with meals. - Hemoglobin A1c  3. Vitamin D deficiency Checking Vitamin D today. - VITAMIN D 25 Hydroxy (Vit-D Deficiency, Fractures)  4. Anxiety with depression Continue Lexapro 2m daily. Advised patient that she can take hydroxyzine up to 3 times daily for anxiety. Recommend reconnecting with her church and discussing possible activities that she may join that would get her out of the house and give her something to fill her time. Referral to behavioral health for counseling as this may also provide some benefit. Will continue to search for other options that may help her fill her time and be able to feel useful and engaged as this may go a long way toward helping resolve some of her anxiety/depression.  - [facility]. Insomnia, unspecified type Continue trazadone  77m nightly as needed for sleep. Discussed her high fall risk status and the risks of injury related to polypharmacy and sedative medications. If she is able to find an activity outside of the home to address the anxiety, she may see an  improvement in her ability to sleep.  6. SI joint arthritis Continue sparing use of Tramadol 512m Refill provided. - traMADol (ULTRAM) 50 MG tablet; Take 1 tablet (50 mg total) by mouth every 8 (eight) hours as needed.  Dispense: 30 tablet; Refill: 0  7. Hyperlipidemia, unspecified hyperlipidemia type Checking lipid panel today. - Lipid panel   Outpatient Encounter Medications as of 08/10/2019  Medication Sig  . Alcohol Swabs (B-D SINGLE USE SWABS REGULAR) PADS Use daily to check blood sugar  . ARIPiprazole (ABILIFY) 10 MG tablet Take 1 tablet (10 mg total) by mouth daily.  . Marland Kitchenspirin 81 MG tablet Take 81 mg by mouth at bedtime.   . Marland Kitchentorvastatin (LIPITOR) 10 MG tablet TAKE 1 TABLET(10 MG) BY MOUTH DAILY  . Blood Glucose Monitoring Suppl (TRUE METRIX AIR GLUCOSE METER) w/Device KIT Use daily to check blood sugar.  DX E11.9  . diphenoxylate-atropine (LOMOTIL) 2.5-0.025 MG tablet TAKE 1 TABLET BY MOUTH 4 TIMES DAILY AS NEEDED FOR DIARRHEA/ LOOSE STOOLS. MAX DAILY DOSE IS 8 TABLETS  . DROPLET INSULIN SYRINGE 31G X 5/16" 1 ML MISC USE AS DIRECTED WITH 88 UNITS OF LANTUS NIGHTLY.  . Marland Kitchenscitalopram (LEXAPRO) 10 MG tablet Take 1 tablet (10 mg total) by mouth daily.  . Marland Kitchenlucose blood (TRUE METRIX BLOOD GLUCOSE TEST) test strip Use daily to check blood sugar.  DX E11.9  . hydrOXYzine (ATARAX/VISTARIL) 25 MG tablet Take 1-3 tablets (25-75 mg total) by mouth 3 (three) times daily as needed for anxiety.  . metFORMIN (GLUCOPHAGE) 1000 MG tablet TAKE 1 TABLET BY MOUTH TWICE DAILY WITH A MEAL  . metoprolol tartrate (LOPRESSOR) 25 MG tablet Take 1 tablet (25 mg total) by mouth 2 (two) times daily.  . traMADol (ULTRAM) 50 MG tablet Take 1 tablet (50 mg total) by mouth every 8 (eight) hours as needed.  . traZODone (DESYREL) 50 MG tablet Take 0.5-1 tablets (25-50 mg total) by mouth at bedtime as needed for sleep.  . TRUEplus Lancets 33G MISC Use daily to check blood sugar.  DX E11.9  . Vitamin D, Ergocalciferol,  (DRISDOL) 1.25 MG (50000 UT) CAPS capsule Take 1 capsule once a week by mouth.  . [DISCONTINUED] traMADol (ULTRAM) 50 MG tablet Take 1 tablet (50 mg total) by mouth every 8 (eight) hours as needed.  . Aspirin Buf,CaCarb-MgCarb-MgO, 81 MG TABS Take by mouth.  . cholestyramine (QUESTRAN) 4 g packet Take 1 packet (4 g total) by mouth 2 (two) times daily. (Patient taking differently: Take 4 g by mouth daily. )  . [DISCONTINUED] conjugated estrogens (PREMARIN) vaginal cream Apply pea size amount daily for 1 week then 2-3 times per week. (Patient not taking: Reported on 07/19/2019)  . [DISCONTINUED] CREON 24000-76000 units CPEP TAKE 1 CAPSULE BY MOUTH THREE TIMES DAILY WITH MEALS (Patient not taking: Reported on 07/19/2019)  . [DISCONTINUED] methenamine (HIPREX) 1 g tablet TAKE 1 TABLET(1 GRAM) BY MOUTH TWICE DAILY WITH A MEAL (Patient not taking: Reported on 08/10/2019)   No facility-administered encounter medications on file as of 08/10/2019.    Follow-up: Return in about 3 months (around 11/10/2019).   JoClearnce SorrelDNP, APRN, FNP-BC [facility]

## 2019-08-11 ENCOUNTER — Other Ambulatory Visit: Payer: Self-pay

## 2019-08-11 DIAGNOSIS — E785 Hyperlipidemia, unspecified: Secondary | ICD-10-CM

## 2019-08-11 LAB — LIPID PANEL
Cholesterol: 150 mg/dL (ref <200)
HDL: 41 mg/dL — ABNORMAL LOW (ref >=50)
LDL Cholesterol (Calc): 71 mg/dL (calc)
Non-HDL Cholesterol (Calc): 109 mg/dL (calc) (ref <130)
Total CHOL/HDL Ratio: 3.7 (calc) (ref <5.0)
Triglycerides: 313 mg/dL — ABNORMAL HIGH (ref <150)

## 2019-08-11 LAB — HEMOGLOBIN A1C
Hgb A1c MFr Bld: 6.1 % of total Hgb — ABNORMAL HIGH (ref <5.7)
Mean Plasma Glucose: 128 (calc)
eAG (mmol/L): 7.1 (calc)

## 2019-08-11 LAB — VITAMIN D 25 HYDROXY (VIT D DEFICIENCY, FRACTURES): Vit D, 25-Hydroxy: 62 ng/mL (ref 30–100)

## 2019-08-11 NOTE — Progress Notes (Signed)
Per provider's request - fasting Lipid panel ordered.

## 2019-08-15 DIAGNOSIS — M25551 Pain in right hip: Secondary | ICD-10-CM | POA: Diagnosis not present

## 2019-08-15 DIAGNOSIS — D649 Anemia, unspecified: Secondary | ICD-10-CM | POA: Diagnosis not present

## 2019-08-15 DIAGNOSIS — I2510 Atherosclerotic heart disease of native coronary artery without angina pectoris: Secondary | ICD-10-CM | POA: Diagnosis not present

## 2019-08-15 DIAGNOSIS — M533 Sacrococcygeal disorders, not elsewhere classified: Secondary | ICD-10-CM | POA: Diagnosis not present

## 2019-08-15 DIAGNOSIS — F418 Other specified anxiety disorders: Secondary | ICD-10-CM | POA: Diagnosis not present

## 2019-08-15 DIAGNOSIS — G8929 Other chronic pain: Secondary | ICD-10-CM | POA: Diagnosis not present

## 2019-08-15 DIAGNOSIS — I471 Supraventricular tachycardia: Secondary | ICD-10-CM | POA: Diagnosis not present

## 2019-08-15 DIAGNOSIS — I10 Essential (primary) hypertension: Secondary | ICD-10-CM | POA: Diagnosis not present

## 2019-08-15 DIAGNOSIS — E11319 Type 2 diabetes mellitus with unspecified diabetic retinopathy without macular edema: Secondary | ICD-10-CM | POA: Diagnosis not present

## 2019-08-19 DIAGNOSIS — I10 Essential (primary) hypertension: Secondary | ICD-10-CM | POA: Diagnosis not present

## 2019-08-19 DIAGNOSIS — I471 Supraventricular tachycardia: Secondary | ICD-10-CM | POA: Diagnosis not present

## 2019-08-19 DIAGNOSIS — G8929 Other chronic pain: Secondary | ICD-10-CM | POA: Diagnosis not present

## 2019-08-19 DIAGNOSIS — F418 Other specified anxiety disorders: Secondary | ICD-10-CM | POA: Diagnosis not present

## 2019-08-19 DIAGNOSIS — I2510 Atherosclerotic heart disease of native coronary artery without angina pectoris: Secondary | ICD-10-CM | POA: Diagnosis not present

## 2019-08-19 DIAGNOSIS — D649 Anemia, unspecified: Secondary | ICD-10-CM | POA: Diagnosis not present

## 2019-08-19 DIAGNOSIS — E11319 Type 2 diabetes mellitus with unspecified diabetic retinopathy without macular edema: Secondary | ICD-10-CM | POA: Diagnosis not present

## 2019-08-19 DIAGNOSIS — M533 Sacrococcygeal disorders, not elsewhere classified: Secondary | ICD-10-CM | POA: Diagnosis not present

## 2019-08-19 DIAGNOSIS — M25551 Pain in right hip: Secondary | ICD-10-CM | POA: Diagnosis not present

## 2019-08-25 ENCOUNTER — Ambulatory Visit (INDEPENDENT_AMBULATORY_CARE_PROVIDER_SITE_OTHER): Payer: Medicare HMO | Admitting: Licensed Clinical Social Worker

## 2019-08-25 DIAGNOSIS — F32A Depression, unspecified: Secondary | ICD-10-CM

## 2019-08-25 DIAGNOSIS — F329 Major depressive disorder, single episode, unspecified: Secondary | ICD-10-CM | POA: Diagnosis not present

## 2019-08-25 DIAGNOSIS — F419 Anxiety disorder, unspecified: Secondary | ICD-10-CM | POA: Diagnosis not present

## 2019-08-25 DIAGNOSIS — F411 Generalized anxiety disorder: Secondary | ICD-10-CM

## 2019-08-25 DIAGNOSIS — F5102 Adjustment insomnia: Secondary | ICD-10-CM | POA: Diagnosis not present

## 2019-08-25 NOTE — Progress Notes (Addendum)
Virtual Visit via Telephone Note  Therapist-home office Patient-home I connected with [name] on 08/25/19 at  2:00 PM EDT by telephone and verified that I am speaking with the correct person using two identifiers.   I discussed the limitations, risks, security and privacy concerns of performing an evaluation and management service by telephone and the availability of in person appointments. I also discussed with the patient that there may be a patient responsible charge related to this service. The patient expressed understanding and agreed to proceed.  I discussed the assessment and treatment plan with the patient. The patient was provided an opportunity to ask questions and all were answered. The patient agreed with the plan and demonstrated an understanding of the instructions.   The patient was advised to call back or seek an in-person evaluation if the symptoms worsen or if the condition fails to improve as anticipated.  I provided 60 minutes of non-face-to-face time during this encounter.   Comprehensive Clinical Assessment (CCA) Note  08/25/2019 [name] [id number]  Visit Diagnosis:      ICD-10-CM   1. Generalized anxiety disorder  F41.1   2. Anxiety and depression  F41.9    F32.9   3. Adjustment insomnia  F51.02     CCA Biopsychosocial  Intake/Chief Complaint:  CCA Intake With Chief Complaint CCA Part Two Date: 08/25/19 CCA Part Two Time: 10 Chief Complaint/Presenting Problem: Patient said woke up one day and was an old woman and shocked her because she thought she lived a normal life. All of sudden felt old and weak. Sleeping depression and slept for three weeks. Depressed couldn't find anything to do with her time. Only thing could do was housework. Angry because of the housework because there was nothing but homework. All life consisted of. Kept busy but left all to be desired. It happens about 7-8 months ago. Resenting that life was just a circle of house work  and yard work. Patient's Currently Reported Symptoms/Problems: depression, ways to make life more fulfilling. Doctor started treating for depression and anxiety. Says "I'm nervous" Patient on Lexapro and hydroxyzine Individual's Strengths: Can't think of anything right now Individual's Preferences: less anxious and less depressed Individual's Abilities: can't think of anything right now used to quilt years ago. Too nervous now can't thread the needle. Sewing can't have nerves. Type of Services Patient Feels Are Needed: therapy, med management Initial Clinical Notes/Concerns: Family history-mother was institutionalized patient was a baby 5 months. Raised by paternal grandmother. Patient's past history for mental health years ago when she was depressed she went to see a Social worker. Denies SI just nervous and restless and depressed. Denies past SA  Mental Health Symptoms Depression:  Depression: Change in energy/activity, Sleep (too much or little), Difficulty Concentrating, Hopelessness, Weight gain/loss, Duration of symptoms greater than two weeks, Worthlessness (can't sleep until 2 AM and then sleep late. Turn hours around. No appetite two months ago. Remind to eat because diabetic. Had to eat by the clock because not hungry. Eating now.)  Mania:     Anxiety:   Anxiety: Difficulty concentrating, Sleep, Worrying, Restlessness (worry about what she is going to do with her time. Doesn't have much housework. Lives with son who is a Administrator and gone a lot. Housework doesn't take a lot of time.)  Psychosis:     Trauma:     Obsessions:     Compulsions:     Inattention:     Hyperactivity/Impulsivity:     Oppositional/Defiant Behaviors:  Emotional Irregularity:     Other Mood/Personality Symptoms:  Other Mood/Personality Symptoms: Anxiety-happens every day, interferes with daily life, distressing. Rates anxiety 6-7 out of 10 with 10 being the worst. Depression is not as bad as was since starting  taking medication. Rates depression as a 4 or 5 out of 10 with 10 being the worst. Depression continue-feels weak. Can't cry with medication she believes could be wrong. Medical issues-diabetic hypertension, arthritis right hip bothers her. Has been bothering her for 5-6 months. Diarrhea because short bowel syndrome. Colon operation colon cancer cut off part of her bowels left her with diarrhea so unpredictable and can happen any time or place makes her unsocial. Hard hearing. 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 Has medications to help stop wears diapers and if soiled has to leave.   Mental Status Exam Appearance and self-care  Stature:  Stature: Small  Weight:  Weight: Overweight  Clothing:     Grooming:     Cosmetic use:     Posture/gait:     Motor activity:     Sensorium  Attention:  Attention: Normal  Concentration:  Concentration: Normal  Orientation:  Orientation: X5  Recall/memory:  Recall/Memory: Normal  Affect and Mood  Affect:  Affect: Depressed  Mood:  Mood: Depressed, Anxious  Relating  Eye contact:     Facial expression:     Attitude toward examiner:  Attitude Toward Examiner: Cooperative  Thought and Language  Speech flow: Speech Flow: Normal  Thought content:  Thought Content: Appropriate to Mood and Circumstances  Preoccupation:     Hallucinations:     Organization:     Transport planner of Knowledge:  Fund of Knowledge: Average  Intelligence:  Intelligence: Average  Abstraction:  Abstraction: Normal  Judgement:  Judgement: Fair  Art therapist:  Reality Testing: Realistic  Insight:  Insight: Fair  Decision Making:  Decision Making: Paralyzed  Social Functioning  Social Maturity:  Social Maturity: Isolates  Social Judgement:  Social Judgement: Normal  Stress  Stressors:  Stressors: Transitions (what she is doing ok with her time. Son married and wife just moved in and a stressor)  Coping Ability:  Coping  Ability: Deficient supports, English as a second language teacher Deficits:  Skill Deficits: Interpersonal  Supports:  Supports:  (lives with son and his wife)     Religion: Religion/Spirituality Are You A Religious Person?: Yes What is Your Religious Affiliation?: Non-Denominational (can't hear when go to church) How Might This Affect Treatment?: n/a  Leisure/Recreation: Leisure / Recreation Do You Have Hobbies?: No  Exercise/Diet: Exercise/Diet Do You Exercise?: Yes What Type of Exercise Do You Do?: Run/Walk (stretch band uses for arm muscles, walk in the house not enough) How Many Times a Week Do You Exercise?: 6-7 times a week (She tries to exercise a little bit every day doctor says not to start moving) Have You Gained or Lost A Significant Amount of Weight in the Past Six Months?: Yes-Lost Number of Pounds Lost?: 35 Do You Follow a Special Diet?: No (whatever she doesn't have to cook, frozen dinner, sandwich, eat a lot of cereal) Do You Have Any Trouble Sleeping?: Yes Explanation of Sleeping Difficulties: hours turned around goes to bet at 2 AM can't get to sleep and wakes up late   CCA Employment/Education  Employment/Work Situation: Employment / Work Situation 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 job?: 8 years Where was the patient employed at  that time?: 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 Uncomfortable with people  Still uncomfortable and knows her s) Were Any Medications Ever Prescribed For These Difficulties?: No (continued from above that knows her self-esteem plays a part in being uncomfortable with people)   CCA Family/Childhood History  Family and Relationship History: Family history Marital status: Single (divorced 20 years ago. Married 3 times.) Are you sexually active?: No What is your sexual orientation?: heterosexual Does patient have children?: Yes How many children?: 1 How is patient's relationship with their children?: kevin and lives with him.  Childhood History:  Childhood History By whom was/is the patient raised?: Grandparents Additional childhood history information: raised by paternal grandmother.  Mom was institutionalized. Dad stayed with grandmother, grandfather there too Description of patient's relationship with caregiver when they were a child: good Patient's description of current relationship with people who raised him/her: have passed How were you disciplined when you got in trouble as a child/adolescent?: n/a Does patient have siblings?: Yes Number of Siblings: 2 Description of patient's current relationship with siblings: 1 brother and 1 sister-youngest. patient estranged with brother. still has relationship with sister. Did patient suffer any verbal/emotional/physical/sexual abuse as a child?: No Did patient suffer from severe childhood neglect?: Yes Patient description of severe childhood neglect: always busy and felt neglected because so busy Has patient ever been sexually abused/assaulted/raped as an adolescent or adult?: No Was the patient ever a victim of a crime or a disaster?: No Witnessed domestic violence?: No Has patient been affected by domestic violence as an adult?: No  Child/Adolescent Assessment: n/a     CCA Substance Use  Alcohol/Drug Use: Alcohol / Drug Use Pain Medications: as needed for hip see MAR Prescriptions: see MAR Over the  Counter: see MAR History of alcohol / drug use?: No history of alcohol / drug abuse                         ASAM's:  Six Dimensions of Multidimensional Assessment  Dimension 1:  Acute Intoxication and/or Withdrawal Potential:      Dimension 2:  Biomedical Conditions and Complications:      Dimension 3:  Emotional, Behavioral, or Cognitive Conditions and Complications:     Dimension 4:  Readiness to Change:     Dimension 5:  Relapse, Continued use, or Continued Problem Potential:     Dimension 6:  Recovery/Living Environment:     ASAM Severity Score:    ASAM Recommended Level of Treatment:     Substance use Disorder (SUD)    Recommendations for Services/Supports/Treatments: Recommendations for Services/Supports/Treatments Recommendations For Services/Supports/Treatments: Individual Therapy, Medication Management  DSM5 Diagnoses: Patient Active Problem List   Diagnosis Date Noted  . Chronic right SI joint pain 04/21/2019  . Anxiety with depression 09/04/2018  . Insomnia 12/25/2017  . Urinary urgency 05/29/2017  . Dysuria 05/29/2017  . Vitamin D deficiency 05/07/2016  . Normocytic anemia 05/07/2016  . Hypoglycemia associated with type 2 diabetes  mellitus ([place]) 10/05/2014  . Rectal cancer ([place]) 03/31/2014  . Diabetic retinopathy ([place]) 11/19/2012  . S/P CABG (coronary artery bypass graft)   . Thrombocytopenia ([place])   . Obesity (BMI 30-39.9)   . History of depression   . Coronary atherosclerosis   .  Paroxysmal SVT ([facility])   . Type 2 diabetes mellitus with vascular disease ([place])   . Hyperlipidemia   . Hypertensive heart disease    Denies SI/HI  Patient Centered Plan: Patient is on the following Treatment Plan(s):  Anxiety, Depression and Low Self-Esteem, coping, transitioning-therapist plan to start with self-esteem to begin to build a foundation for patient to work on mood symptoms as well as taking steps to network and make social connection that will help  with mood symptoms.  Treatment plan will be formulated at next treatment session   Referrals to Alternative Service(s): Referred to Alternative Service(s):   Place:   Date:   Time:    Referred to Alternative Service(s):   Place:   Date:   Time:    Referred to Alternative Service(s):   Place:   Date:   Time:    Referred to Alternative Service(s):   Place:   Date:   Time:     Cordella Register

## 2019-08-26 DIAGNOSIS — I2510 Atherosclerotic heart disease of native coronary artery without angina pectoris: Secondary | ICD-10-CM | POA: Diagnosis not present

## 2019-08-26 DIAGNOSIS — E11319 Type 2 diabetes mellitus with unspecified diabetic retinopathy without macular edema: Secondary | ICD-10-CM | POA: Diagnosis not present

## 2019-08-26 DIAGNOSIS — I10 Essential (primary) hypertension: Secondary | ICD-10-CM | POA: Diagnosis not present

## 2019-08-26 DIAGNOSIS — D649 Anemia, unspecified: Secondary | ICD-10-CM | POA: Diagnosis not present

## 2019-08-26 DIAGNOSIS — I471 Supraventricular tachycardia: Secondary | ICD-10-CM | POA: Diagnosis not present

## 2019-08-26 DIAGNOSIS — M533 Sacrococcygeal disorders, not elsewhere classified: Secondary | ICD-10-CM | POA: Diagnosis not present

## 2019-08-26 DIAGNOSIS — F418 Other specified anxiety disorders: Secondary | ICD-10-CM | POA: Diagnosis not present

## 2019-08-26 DIAGNOSIS — M25551 Pain in right hip: Secondary | ICD-10-CM | POA: Diagnosis not present

## 2019-08-26 DIAGNOSIS — G8929 Other chronic pain: Secondary | ICD-10-CM | POA: Diagnosis not present

## 2019-08-31 DIAGNOSIS — G8929 Other chronic pain: Secondary | ICD-10-CM | POA: Diagnosis not present

## 2019-08-31 DIAGNOSIS — I471 Supraventricular tachycardia: Secondary | ICD-10-CM | POA: Diagnosis not present

## 2019-08-31 DIAGNOSIS — F418 Other specified anxiety disorders: Secondary | ICD-10-CM | POA: Diagnosis not present

## 2019-08-31 DIAGNOSIS — I2510 Atherosclerotic heart disease of native coronary artery without angina pectoris: Secondary | ICD-10-CM | POA: Diagnosis not present

## 2019-08-31 DIAGNOSIS — M533 Sacrococcygeal disorders, not elsewhere classified: Secondary | ICD-10-CM | POA: Diagnosis not present

## 2019-08-31 DIAGNOSIS — I10 Essential (primary) hypertension: Secondary | ICD-10-CM | POA: Diagnosis not present

## 2019-08-31 DIAGNOSIS — E11319 Type 2 diabetes mellitus with unspecified diabetic retinopathy without macular edema: Secondary | ICD-10-CM | POA: Diagnosis not present

## 2019-08-31 DIAGNOSIS — D649 Anemia, unspecified: Secondary | ICD-10-CM | POA: Diagnosis not present

## 2019-08-31 DIAGNOSIS — M25551 Pain in right hip: Secondary | ICD-10-CM | POA: Diagnosis not present

## 2019-09-03 ENCOUNTER — Other Ambulatory Visit: Payer: Self-pay

## 2019-09-03 MED ORDER — DIPHENOXYLATE-ATROPINE 2.5-0.025 MG PO TABS: ORAL_TABLET | ORAL | 0 refills | Status: DC

## 2019-09-09 ENCOUNTER — Telehealth: Payer: Self-pay | Admitting: Gastroenterology

## 2019-09-09 NOTE — Telephone Encounter (Signed)
Patient called states she has chronic diarrhea please advise

## 2019-09-10 NOTE — Telephone Encounter (Signed)
Spoke with patient, patient states that she has chronic diarrhea, pt previously prescribed Questran from a different provider - pt stated that she was not taking it as directed, pt states that she has been avoiding fruits and vegetables at this time, denies a diet high in fiber. Pt advised to try Imodium OTC for diarrhea and to stay well hydrated. Pt scheduled for new patient appt with Dr. Fuller Plan on 11/04/19 at 9:30 am, pt advised to follow up with PCP until appt with Dr. Fuller Plan if symptoms persist or worsen.

## 2019-09-27 ENCOUNTER — Ambulatory Visit (INDEPENDENT_AMBULATORY_CARE_PROVIDER_SITE_OTHER): Payer: Medicare HMO | Admitting: Licensed Clinical Social Worker

## 2019-09-27 DIAGNOSIS — F419 Anxiety disorder, unspecified: Secondary | ICD-10-CM | POA: Diagnosis not present

## 2019-09-27 DIAGNOSIS — F329 Major depressive disorder, single episode, unspecified: Secondary | ICD-10-CM | POA: Diagnosis not present

## 2019-09-27 DIAGNOSIS — F5102 Adjustment insomnia: Secondary | ICD-10-CM | POA: Diagnosis not present

## 2019-09-27 DIAGNOSIS — F411 Generalized anxiety disorder: Secondary | ICD-10-CM

## 2019-09-27 NOTE — Progress Notes (Signed)
Virtual Visit via Telephone Note  Therapist-home office Patient-home I connected with [name] on 09/27/19 at  1:00 PM EDT by telephone and verified that I am speaking with the correct person using two identifiers.   I discussed the limitations, risks, security and privacy concerns of performing an evaluation and management service by telephone and the availability of in person appointments. I also discussed with the patient that there may be a patient responsible charge related to this service. The patient expressed understanding and agreed to proceed.   I discussed the assessment and treatment plan with the patient. The patient was provided an opportunity to ask questions and all were answered. The patient agreed with the plan and demonstrated an understanding of the instructions.   The patient was advised to call back or seek an in-person evaluation if the symptoms worsen or if the condition fails to improve as anticipated.  I provided 12minutes of non-face-to-face time during this encounter.  THERAPIST PROGRESS NOTE  Session Time: 1:00 PM to 1:50 PM  Participation Level: Active  Behavioral Response: CasualAlertDepressed  Type of Therapy: Individual Therapy  Treatment Goals addressed:  Decrease in depression, anxiety, increase in self-esteem, coping Interventions: CBT, Solution Focused, Strength-based, Supportive, Reframing and Other: coping  Summary: [name] is a 69 y.o. female who presents with everything about the same. Stayed so long in the house now so it is hard for her to want to go out. Did go out and got her hair done and nail done. Glad when it was over. Didn't particularly enjoy it was something she had to do. Glad to be at home. Did it a day before yesterday. Over a month since been out. Bored at home. Used to like to read but don't care anymore. Discussed setting a goal for herself. Will exercise for 10 minutes every day. Has been stuck thinking she needs to do  something but doesn't know what. Focus of attention is negative focused on the cycle she is in where she tells herself just get up and do anything.Can't find anything she wants to get up and do.  Therapist laid ground work of quality of life and mood improves when she does something she enjoys, that has purpose and connected to others.  Reviewed session and patient relates agrees she needs to work on changing her focus of attention. Got a lot out of session and lot to work on. Has to do something different.  If she does not do anything nothing is going to change.  Suicidal/Homicidal: No  Therapist Response: Therapist reviewed symptoms, worked on developing therapeutic rapport as this is our second session, facilitated expression of thoughts and feelings, develop treatment plan and patient gave consent to complete by phone.  Therapist introduced concepts of CBT of how her behaviors and thoughts contribute to depression.  That we look at things distorted way with gloomy specs in a vicious cycle of depression is developed with gloomy thoughts lead to depressed feelings that lead to behaviors where we do less and less that continues the cycle of negative thoughts.  Work with patient on breaking the cycle through increase in activity.  Explained this is often the first place to start that creased activity helps with improvement in mood and develop plan for patient to exercise.  Discussed we can look at the different cogs depression and another one is to change patient's focus to help with depression.  Gust picking activities that are rewarding and then are not hard when starting to break  the cycle of depression.  Begin to help patient in reframing to help with change in focus instead of not knowing what to do to tell herself I am going to try different things to like find something that I am interested in in developing a plan for improvement of my mood.  Also encourage patient strategy not to stagnate within decision  as she will not find a perfect answer but to start something we will move her forward.  Therapist provided active listening open questions and strength-based intervention  Plan: Return again in 1 weeks.2.  Patient will exercise 10 minutes a day 3.  Therapist work with patient on self-esteem, anxiety and depression  Diagnosis: Axis I:  generalized anxiety disorder, anxiety and depression, adjustment insomnia    Axis II: No diagnosis    Danielle Register, LCSW 09/27/2019

## 2019-09-28 ENCOUNTER — Other Ambulatory Visit: Payer: Self-pay | Admitting: Family Medicine

## 2019-10-01 ENCOUNTER — Other Ambulatory Visit: Payer: Self-pay | Admitting: Family Medicine

## 2019-10-04 ENCOUNTER — Other Ambulatory Visit: Payer: Self-pay | Admitting: Family

## 2019-10-04 ENCOUNTER — Other Ambulatory Visit: Payer: Self-pay | Admitting: Medical-Surgical

## 2019-10-04 DIAGNOSIS — E559 Vitamin D deficiency, unspecified: Secondary | ICD-10-CM

## 2019-10-06 ENCOUNTER — Ambulatory Visit (INDEPENDENT_AMBULATORY_CARE_PROVIDER_SITE_OTHER): Payer: Medicare HMO | Admitting: Licensed Clinical Social Worker

## 2019-10-06 DIAGNOSIS — F5102 Adjustment insomnia: Secondary | ICD-10-CM

## 2019-10-06 DIAGNOSIS — F419 Anxiety disorder, unspecified: Secondary | ICD-10-CM | POA: Diagnosis not present

## 2019-10-06 DIAGNOSIS — F329 Major depressive disorder, single episode, unspecified: Secondary | ICD-10-CM

## 2019-10-06 DIAGNOSIS — F411 Generalized anxiety disorder: Secondary | ICD-10-CM | POA: Diagnosis not present

## 2019-10-06 DIAGNOSIS — F32A Depression, unspecified: Secondary | ICD-10-CM

## 2019-10-06 NOTE — Progress Notes (Signed)
Virtual Visit via Telephone Note  Therapist-home office Patient-home I connected with [name] on 10/06/19 at  1:00 PM EDT by telephone and verified that I am speaking with the correct person using two identifiers.   I discussed the limitations, risks, security and privacy concerns of performing an evaluation and management service by telephone and the availability of in person appointments. I also discussed with the patient that there may be a patient responsible charge related to this service. The patient expressed understanding and agreed to proceed.   I discussed the assessment and treatment plan with the patient. The patient was provided an opportunity to ask questions and all were answered. The patient agreed with the plan and demonstrated an understanding of the instructions.   The patient was advised to call back or seek an in-person evaluation if the symptoms worsen or if the condition fails to improve as anticipated.  I provided 50 minutes of non-face-to-face time during this encounter.   THERAPIST PROGRESS NOTE  Session Time: 1:00 PM to 1:50 PM  Participation Level: Active  Behavioral Response: CasualAlertDepressed  Type of Therapy: Individual Therapy  Treatment Goals addressed: Decrease in depression, anxiety, increase in self-esteem, coping  Interventions: DBT, Solution Focused, Strength-based, Supportive, Reframing and Other: coping  Summary: [name] is a 69 y.o. female who presents with thought goal would be easy and harder than thought. Issues with being weak. Walking is something she needs to do. Talked about seeing what patient did as success and she looking at a failure. Plan is to continue to exercise at her own pace and patient says she will and has to try. Therapist pointed out something for her to feel good about following through with plan. Has something good to tell therapist. Had a friend called and invited her to make home made egg roll. I went and  "halfway enjoyed it". Didn't want to go and made herself go has try new things. It was a big push and proud of herself from going.  Therapist said she benefit from connecting to others understandably she needs to take it slow but there opportunities out there for her to have fun.  Discussed other activities to add to her day and shares she doesn't have the energy to cook. Doesn't have the energy to clean like she used to. Use to be really good at that. Cooked a boiled ham and macaroni and really proud of herself. Goal is for her to cook at meal for next time. She also agrees to add relaxation and will do deep breathing therapist said she could just sigh at say to herself some positive self statements while we build skills around mindfulness. Therapist reviewed symptoms, facilitated expression of thoughts and feelings and pointing out and reframe for patient that she did not fail but actually succeeded as she follow through with working on plan of exercising.  She just found out that she has to go at a slower pace but still derives benefit from exercise and by applying coping skills she will start to make progress.  Discussed motivational strategy that looks at actually starting something is successful that motivation comes from doing something so patient has been successful in starting something and something we can build on.  Provided positive feedback for patient's motivation to continue.  Identified another success for patient that she went with friends and really pushed herself putting forth the effort and outcome was that she enjoyed herself.  Therapist pointed out this is something she can build on  and realizes this is something she can have fun at so she can continue to push herself discussed connecting with others as helpful for mood.  Identified other activities to add to her day relating that she is reversing the cycle that leads to depression by doing more activities identified patient will cook a meal  and she also will do relaxation exercise.  Therapist introduced mindfulness to patient and will continue to help her with this strategy.  Therapist introduced patient to 5 senses explain how this helps with mood regulation as well as relaxation.  Patient for next time will take deep breaths and use positive self statements as her relaxation.  Discussed working on reframing negative thoughts to more positive will help with mood.  Therapist provided active listening open questions supportive interventions   Suicidal/Homicidal: No  Plan: Return again in 1 weeks.2.  Patient continue to exercise, cook a meal, do relaxation exercise every day, push herself to say yes to invitations 3.  Best continue with mindfulness using raisin exercise, work on treatment interventions for depression, work on self-esteem  Diagnosis: Axis I:   generalized anxiety disorder, anxiety and depression, adjustment insomnia     Axis II: No diagnosis    Cordella Register, LCSW 10/06/2019

## 2019-10-13 ENCOUNTER — Ambulatory Visit (INDEPENDENT_AMBULATORY_CARE_PROVIDER_SITE_OTHER): Payer: Medicare HMO | Admitting: Licensed Clinical Social Worker

## 2019-10-13 DIAGNOSIS — F329 Major depressive disorder, single episode, unspecified: Secondary | ICD-10-CM | POA: Diagnosis not present

## 2019-10-13 DIAGNOSIS — F411 Generalized anxiety disorder: Secondary | ICD-10-CM

## 2019-10-13 DIAGNOSIS — F419 Anxiety disorder, unspecified: Secondary | ICD-10-CM

## 2019-10-13 DIAGNOSIS — F5102 Adjustment insomnia: Secondary | ICD-10-CM | POA: Diagnosis not present

## 2019-10-13 NOTE — Progress Notes (Signed)
Virtual Visit via Telephone Note  Therapist-home office Patient-home I connected with [name] on 10/13/19 at  1:00 PM EDT by telephone and verified that I am speaking with the correct person using two identifiers.   I discussed the limitations, risks, security and privacy concerns of performing an evaluation and management service by telephone and the availability of in person appointments. I also discussed with the patient that there may be a patient responsible charge related to this service. The patient expressed understanding and agreed to proceed.   I discussed the assessment and treatment plan with the patient. The patient was provided an opportunity to ask questions and all were answered. The patient agreed with the plan and demonstrated an understanding of the instructions.   The patient was advised to call back or seek an in-person evaluation if the symptoms worsen or if the condition fails to improve as anticipated.  I provided 50 minutes of non-face-to-face time during this encounter.   THERAPIST PROGRESS NOTE  Session Time: 1:00 PM to 1:50 PM  Participation Level: Active  Behavioral Response: CasualAlertDepressed  Type of Therapy: Individual Therapy  Treatment Goals addressed:  Decrease in depression, anxiety, increase in self-esteem, coping Interventions: CBT, Solution Focused, Strength-based, Supportive and Other: coping  Summary: [name] is a 69 y.o. female who presents with went out with ladies and they went out to lunch, shopping and then home for coffee and donuts. Still doing exercise doing terrible but haven't given up. Realizes important for her to do. Does too much of relaxation. Lay around. Doesn't like television. Bored and sleeps a lot. Therapist guided patient I asking ladies what they do to inspire her to do a project and she said she needs to. Discussed used to like to read. Christianity was the last book she read. "Get [place] of [place]." Thinks she  should start reading it. Does pushing away, like a TV channel and change the channel and thoughts don't need to be thinking about.  Discussed setting goals. Today is laundry. Doesn't want to do it but makes herself do it. Next Tuesday again plans to do something with ladies. Volunteer is not a good option because of physical problems. Discussed doing it on smaller scale. Discussed self-esteem as "zero". Doesn't know where it comes from. Son paid her a compliment. He said good moral compass. Always in life when there is something to do you want to do the right thing. Guided ways patient could think of others. Could do that with son the way he helped her to fee better. He is good to her and tries to tell him she appreciates it. He tries to make sure she is taken care of. He is a good son. Therapist pointed out that this is a strength of patient's because he had to learn it from her. He took her out to lunch. Therapist pointed him out as a Theatre manager. He told her that she needs to get out of the house more and took her to the park and went for a walk. Went to [employer]. He says go wherever you want to go. Patient says the problem is she doesn't know where to go. Doesn't feel like going anywhere. Therapist guided patient just going is good for her and patient says she will talk to him about that.  Suicidal/Homicidal: No  Therapist Response: Therapist reviewed symptoms, facilitated expression of thoughts and feelings, reviewed getting out more as helpful for patient's mood and positive she took invitation with ladies, has an upcoming  appointment and that son is a good resource for her.  Helped as far as self-esteem but also for a way for her to be busier which will be good for her.  Patient will talk to him about doing more with him.  Therapist discussed acting into feeling different, "fake it to you make it" to encourage patient to push herself into doing something even when she does not feel it and will help change  her mood.  Reviewed except from [place] for more strategies to help change negative mood such as comparisons, emotions, identified coping skill patient has of compartmentalizing her thoughts and provided positive feedback for using coping skills.  Reviewed gratitude is helpful for mood and even grateful for small things.  Reviewed how therapy can be helpful in helping her look at things differently, helping patient realize new ways of things things that will help her with her mood.  Discussed goal setting as a way of increasing mood, emphasize strength son gave her as a significant 1.  Reviewed plan for patient is to continue exercise, talk to her son about doing more, setting a goal for the day.  Therapist provided active listening open questions supportive interventions  Plan: Return again in 1 weeks.2.  Patient will make plans to get out more be busier by talking to son and continuing to see her lady friends.  Patient will set goal for today and continue to exercise 3.  Therapist continue to work on strategies to help decrease depression  Diagnosis: Axis I:  generalized anxiety disorder, anxiety and depression, adjustment insomnia    Axis II: No diagnosis    Cordella Register, LCSW 10/13/2019

## 2019-10-15 ENCOUNTER — Other Ambulatory Visit: Payer: Self-pay | Admitting: Medical-Surgical

## 2019-10-15 DIAGNOSIS — M47818 Spondylosis without myelopathy or radiculopathy, sacral and sacrococcygeal region: Secondary | ICD-10-CM

## 2019-10-25 ENCOUNTER — Ambulatory Visit (INDEPENDENT_AMBULATORY_CARE_PROVIDER_SITE_OTHER): Payer: Medicare HMO | Admitting: Licensed Clinical Social Worker

## 2019-10-25 DIAGNOSIS — F329 Major depressive disorder, single episode, unspecified: Secondary | ICD-10-CM | POA: Diagnosis not present

## 2019-10-25 DIAGNOSIS — F419 Anxiety disorder, unspecified: Secondary | ICD-10-CM | POA: Diagnosis not present

## 2019-10-25 DIAGNOSIS — F411 Generalized anxiety disorder: Secondary | ICD-10-CM

## 2019-10-25 DIAGNOSIS — F5102 Adjustment insomnia: Secondary | ICD-10-CM | POA: Diagnosis not present

## 2019-10-25 DIAGNOSIS — F32A Depression, unspecified: Secondary | ICD-10-CM

## 2019-10-25 NOTE — Progress Notes (Signed)
Virtual Visit via Telephone Note  Therapist-home office Patient-home I connected with [name] on 10/25/19 at  1:00 PM EDT by telephone and verified that I am speaking with the correct person using two identifiers.   I discussed the limitations, risks, security and privacy concerns of performing an evaluation and management service by telephone and the availability of in person appointments. I also discussed with the patient that there may be a patient responsible charge related to this service. The patient expressed understanding and agreed to proceed.   I discussed the assessment and treatment plan with the patient. The patient was provided an opportunity to ask questions and all were answered. The patient agreed with the plan and demonstrated an understanding of the instructions.   The patient was advised to call back or seek an in-person evaluation if the symptoms worsen or if the condition fails to improve as anticipated.  I provided 50 minutes of non-face-to-face time during this encounter.  THERAPIST PROGRESS NOTE  Session Time: 1:00 PM to 1:50 PM  Participation Level: Active  Behavioral Response: CasualAlertAnxious and Depressed  Type of Therapy: Individual Therapy  Treatment Goals addressed:  Decrease in depression, anxiety, increase in self-esteem, copingInterventions: CBT, Solution Focused, Strength-based, Supportive, Reframing and Other: coping  Summary: [name] is a 69 y.o. female who presents with sister came over and went out lunch. Talks to her sister on the phone a lot comes over and supposed to spend the night at hers. Nothing extra patient has done it is her normal routine with sister. She gets on her nerves. She is a chatterbox. She bores patient. She is widowed. It is not a real conversation that is back and forth. She chatters and patient listens. Tried to talk to her about it a couple of times and didn't work. Explored getting out. Did get out and went to  grocery store and got gas. Hasn't been driving at all. One day this past week. Therapist explored how that was experience was. She felt good that drove and thought she did good. Didn't cook anything but made cornbread and cook it for super. Still doing exercise even though hard for her. Explored why she decided to drive. Couldn't stand being in the house anymore. 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 Therapist explored whether incentive to go out again and she said yes. Did feel good to get out of the house. 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 Explored why hadn't driven in awhile and said afraid. Insecure driver.  Therapist said opened a door and leads to more opportunities. Gives her more access to things. Therapist pointed out that positive she did it on own. Patient shares she still stays in the house too much. [facility] and scary to make the first move. Line dancing sounds good. Therapist encouraged patient to talk about her interest in cooking. Made up her mind to cook chicken dumpling. Not give up the exercise but feels important.  Therapist pointed out ways patient is really trying.  Discussed impact of losing her husband has had on her.  Didn't think good marriage husband wanted a little girl and not a wife. He wanted to call all the shots. Stay with him 14 years together. Discussed about why feeling getting older and said body feels getting older. Therapist pointed even more important to keep up with exercise. Discussed being outside helps with mood and explored whether she can get out to do exercise such  as walk. Discussed what she grateful for and is her home. Explored why "maybe it is her hiding place". All done in her taste. Likes wood floors and [employer] and furniture. Lay out of rooms.  Plan for her to identify every day 3 things she is grateful for.  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  Discussed the cogs of pression that keep going and significantly includes withdrawal, isolating, doing less which is why getting out will be so helpful for her.  Provided positive feedback for patient's positive self statement is important for patient to work on self-esteem therapist assesses pointed this out to patient was positive for her to say and disrupt negative thinking patterns.  Therapist assesses mood will improve when self-esteem improves.  Encouraging patient with other activities such as exercise, cooking, guiding patient exploring her interest that include cooking.  Support other options for her to go on enjoy herself such as senior center.  Discussed focus on tension on something positive will help with mood plan for patient to identify every day 3 things she is grateful for.  Was provided active listening, open questions supportive interventions  Plan: Return again in 1 weeks.2.  Therapist continue to work with patient on anxiety and depression 3.  Patient continue with positive activities as well as identifying 3 things she is grateful for 4.Work with patient on self-esteem  Diagnosis: Axis I:  generalized anxiety disorder, anxiety and depression, adjustment insomnia    Axis II: No diagnosis    Cordella Register, LCSW 10/25/2019

## 2019-11-01 ENCOUNTER — Telehealth: Payer: Self-pay

## 2019-11-01 ENCOUNTER — Ambulatory Visit (INDEPENDENT_AMBULATORY_CARE_PROVIDER_SITE_OTHER): Payer: Medicare HMO | Admitting: Licensed Clinical Social Worker

## 2019-11-01 DIAGNOSIS — F419 Anxiety disorder, unspecified: Secondary | ICD-10-CM

## 2019-11-01 DIAGNOSIS — F411 Generalized anxiety disorder: Secondary | ICD-10-CM | POA: Diagnosis not present

## 2019-11-01 DIAGNOSIS — F32A Depression, unspecified: Secondary | ICD-10-CM

## 2019-11-01 DIAGNOSIS — F5102 Adjustment insomnia: Secondary | ICD-10-CM

## 2019-11-01 DIAGNOSIS — F329 Major depressive disorder, single episode, unspecified: Secondary | ICD-10-CM | POA: Diagnosis not present

## 2019-11-01 NOTE — Progress Notes (Signed)
Virtual Visit via Telephone Note  Therapist-home office Patient-home I connected with [name] on 11/01/19 at  1:00 PM EDT by telephone and verified that I am speaking with the correct person using two identifiers.   I discussed the limitations, risks, security and privacy concerns of performing an evaluation and management service by telephone and the availability of in person appointments. I also discussed with the patient that there may be a patient responsible charge related to this service. The patient expressed understanding and agreed to proceed.   I discussed the assessment and treatment plan with the patient. The patient was provided an opportunity to ask questions and all were answered. The patient agreed with the plan and demonstrated an understanding of the instructions.   The patient was advised to call back or seek an in-person evaluation if the symptoms worsen or if the condition fails to improve as anticipated.  I provided 40 minutes of non-face-to-face time during this encounter.  THERAPIST PROGRESS NOTE  Session Time: 1:00 PM to 1:40 PM  Participation Level: Active  Behavioral Response: CasualAlertDepressed  Type of Therapy: Individual Therapy  Treatment Goals addressed:  Decrease in depression, anxiety, increase in self-esteem, coping Interventions: Solution Focused, Strength-based, Reframing and Other: coping  Summary: [name] is a 69 y.o. female who presents with went out to lunch with sister one day. Looking at [employer]. 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 Therapist pointed out that it is positive sign of going out so soon after last time and do something fun. Patient said she is really trying. Therapist said that being able to drive means doing things she enjoys. Patient says that the problem is that she  doesn't know what she enjoys. Therapist encouraged her to try different things to figure it out. Tried line  dancing at the house balance was so bad couldn't do it.  Therapist pointed out positive that she tired may benefit from somebody who could teach, sharing wiht patient that anything new can be a challenge. 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 Had that a year and a half ago. Patient said it was scary. Started first with her hearing that's when they found it. Therapist pointed out that she was survivor. It wasn't cancerous it was only a tumor. Therapist explored when her mental health issues started when started feeling older. Explored what getting older means and that even though not the same find ways to transition to change and find ways to enjoy. Explored if depression came after surgery and sister thinks so. Still doing the exercise thing and still hard but doing it. From surgery think it makes it hard because does stagger. Didn't cook a meal maybe try this week. Definitely think of three things positive about. Discussed asking her doctor about physical therapy might be a good idea and patient says she thinks it could be. Discussed whether she thought therapy was helping and shared "Not quite sure." She went on to say that "it is my fault not doing suggestions. Motivation isn't there to do things."  Discussed patient taking a break from therapy as she is not sure she sees benefits and patient does want to take a break.  If she decides to restart therapy she will contact therapist.  Suicidal/Homicidal: No  Therapist Response: Reviewed symptoms, facilitated expression of thoughts and feelings provided positive feedback for efforts patient has made as coping strategies for depression, identified may be  starting to make progress with her getting out and driving and that may lead to her connecting with more activities she enjoys.  Discussed needing to have a regular schedule activities including doing more activities to help her with mood.  Explored sources of depression seems connected  to surgery and explored health physical changes can impact [place] but that patient met the challenges of surgery and working on the challenge of getting past any impact it may have had on her by following some of the suggestions talked about in sessions.  Reviewed physical therapy is a good option.  Gust mindset of getting older that she can still enjoy things.  Discussed can still make changes physically through coping strategies, releasing positive chemicals, making brain changes brain thing you can change.  Assess low motivation patient has for some suggestions and not invested at present time enough to continue therapy.  As she reviews and feels she wants to continue she will contact therapist.  Therapist provided active listening, open questions supportive interventions.  Courage patient with thinking of 3 things she is grateful for every day, continuing to increase activity, activities she enjoys, connection with others  Plan: 1.  Patient has decided to discontinue therapy and will contact therapy if she wants to restart.  Therapist encourage patient with coping strategies she feels will be helpful for patient's depression including staying busy and getting out more, connecting with others, exercise, thinking of 3 things she is grateful for.    Diagnosis: Axis I: generalized anxiety disorder, anxiety and depression, adjustment insomnia    Axis II: No diagnosis    Cordella Register, LCSW 11/01/2019

## 2019-11-01 NOTE — Telephone Encounter (Signed)
[place] (1st attempt)   Pt called and LVM wanting to know how to go about having her son help her with medication management.

## 2019-11-04 ENCOUNTER — Other Ambulatory Visit: Payer: Medicare HMO

## 2019-11-04 ENCOUNTER — Encounter: Payer: Self-pay | Admitting: Gastroenterology

## 2019-11-04 ENCOUNTER — Ambulatory Visit (INDEPENDENT_AMBULATORY_CARE_PROVIDER_SITE_OTHER): Payer: Medicare HMO | Admitting: Gastroenterology

## 2019-11-04 VITALS — BP 98/52 | HR 72 | Ht 60.0 in | Wt 131.5 lb

## 2019-11-04 DIAGNOSIS — Z85048 Personal history of other malignant neoplasm of rectum, rectosigmoid junction, and anus: Secondary | ICD-10-CM | POA: Diagnosis not present

## 2019-11-04 DIAGNOSIS — R197 Diarrhea, unspecified: Secondary | ICD-10-CM

## 2019-11-04 DIAGNOSIS — A09 Infectious gastroenteritis and colitis, unspecified: Secondary | ICD-10-CM | POA: Diagnosis not present

## 2019-11-04 NOTE — Telephone Encounter (Signed)
[employer] (2nd attempt)   Pt called and LVM wanting to know how to go about having her son help her with medication management.

## 2019-11-04 NOTE — Progress Notes (Signed)
    History of Present Illness: This is a 69 year old female complaining of persistent mild diarrhea and occasional incontinence.  She has difficulty hearing and she is accompanied by her sister to assist her in the visit.  She has a history of stage IIA rectal cancer S/P LAR and right hemicolectomy for a large tubular adenoma in 2016.  She underwent colonoscopy in June as below.  She relates difficulties with loose bowel movements occurring about twice daily however some days she does not have a bowel movement.  With looser stool she has had an occasional episode of fecal incontinence.  She notes that Lomotil is helpful.  She does not recall trying cholestyramine. Denies weight loss, abdominal pain, change in stool caliber, melena, hematochezia, nausea, vomiting, dysphagia, reflux symptoms, chest pain.   Colonoscopy 07/2019 - Diverticulosis in the sigmoid colon, in the descending colon and in the transverse colon. - Patent end-to-side ileo-colonic anastomosis, characterized by healthy appearing mucosa. - Patent end-to-end colo-colonic anastomosis, characterized by healthy appearing mucosa. - A tattoo was seen in the rectum. The tattoo site appeared normal. - The examination was otherwise normal on direct views. - No specimens collected.  Current Medications, Allergies, Past Medical History, Past Surgical History, Family History and Social History were reviewed in [employer] record.   Physical Exam: General: Well developed, well nourished, no acute distress Head: Normocephalic and atraumatic Eyes:  sclerae anicteric, EOMI Ears: Decreased auditory acuity Mouth: Not examined, mask on during Covid-19 pandemic Lungs: Clear throughout to auscultation Heart: Regular rate and rhythm; soft systolic murmur. No rubs or bruits Abdomen: Soft, non tender and non distended. No masses, hepatosplenomegaly or hernias noted. Normal Bowel sounds Rectal: DRE unremarkable at recent  colonoscopy, not repeated today  Musculoskeletal: Symmetrical with no gross deformities  Pulses:  Normal pulses noted Extremities: No clubbing, cyanosis, edema or deformities noted Neurological: Alert oriented x 4, grossly nonfocal Psychological:  Alert and cooperative.  Anxious   Assessment and Recommendations:  1. Diarrhea, occasional incontinence, occasional constipation. S/P low anterior resection and right hemicolectomy in 2016 for stage IIA rectal cancer and a large tubular adenoma respectively.  Send GI stool profile and fecal elastase. R/O diarrhea due to metformin side effect.  I have advised her to ask to discuss with her PCP a 5 to 7-day Metformin holiday to further assess.  Change Lomotil to 1 p.o. every morning (not as needed) and then 1-2 qid as needed for diarrhea.  Consider trial of cholestyramine or colestipol if no diagnosis is established and above measures are not effective. REV in 6 weeks.   2.  Personal history of stage IIa rectal cancer and a large tubular adenoma.  3-year interval surveillance colonoscopy is recommended in June 2024.  3. DM2  4. Depression, anxiety.

## 2019-11-04 NOTE — Patient Instructions (Signed)
Your provider has requested that you go to the basement level for lab work before leaving today. Press "B" on the elevator. The lab is located at the first door on the left as you exit the elevator.  You can take your Lomotil one tablet by mouth in the morning and up to four times a day as needed throughout the day.  Discuss with your primary care physician regarding temporarily holding your metformin to see if this helps with your symptoms.   Due to recent changes in healthcare laws, you may see the results of your imaging and laboratory studies on MyChart before your provider has had a chance to review them.  We understand that in some cases there may be results that are confusing or concerning to you. Not all laboratory results come back in the same time frame and the provider may be waiting for multiple results in order to interpret others.  Please give [place] 48 hours in order for your provider to thoroughly review all the results before contacting the office for clarification of your results.    Thank you for choosing me and [facility].  Pricilla Riffle. Dagoberto Ligas., MD., [facility] Regal

## 2019-11-07 NOTE — Progress Notes (Signed)
Subjective:    CC: discuss DM medications  HPI: Pleasant 69 year old female presenting to discuss changing from metformin to a different diabetes medication.  Reports seeing Dr. Fuller Plan last week gastroenterology who suspects that her Metformin may be worsening her diarrhea.  She has been having almost daily diarrhea with 20 days out of 30 in September having at least one episode of diarrhea.  Notes that 17 days out of August also involved having diarrhea.  Lomotil does help some and she reports taking it when she noticed that she has diarrhea.  She has been on Metformin 1000 mg twice daily with meals for many years.  It did always cause some GI upset but not this bad.  She has never been on any other diabetic medication.  She checks her glucose every morning fasting with results 118-132.  Her last hemoglobin A1c was 6.1%.  I reviewed the past medical history, family history, social history, surgical history, and allergies today and no changes were needed.  Please see the problem list section below in epic for further details.  Past Medical History: Past Medical History:  Diagnosis Date  .  Paroxysmal SVT (ANVRT)    S/p AV nodal ablation Dr. Lovena Le 09/28/08   . Anxiety   . CAD    Coronary disease status post non-ST elevation MI in May 2010 with  PCI of the left circumflex on Jun 24, 2008. She then had a PCI of  the RCA on July 15, 2008. 02/24/12 Cath, Severe 95% LAD, ostial 95% circ, ostial RCA, patent stents in distal RCA and mid circ normal LV 02/26/12 CABG with LIMA to LAD, SVG to RCA, and SVG to OM Dr. Roxan Hockey    . Cancer [facility])    adenocarcinoma of rectum  . Cataract   . Depression   . Diabetes mellitus, insulin dependent (IDDM), uncontrolled   . Heart murmur   . Hyperlipidemia   . Hypertension   . Hypertensive heart disease   . S/P radiation therapy 04/11/14-05/18/14   50.4Gy rectal  . Vertigo    Result of chemotherapy; has been to ENT and told nothing to be done   Past Surgical  History: Past Surgical History:  Procedure Laterality Date  . brain tumor removed     2019  . COLON SURGERY     2016  . COLONOSCOPY    . COLONOSCOPY WITH PROPOFOL N/A 07/12/2014   Procedure: COLONOSCOPY WITH PROPOFOL POSSIBLE POLYP RESECTION;  Surgeon: [name], MD;  Location: [facility];  Service: Endoscopy;  Laterality: N/A;   to be admitted after colonoscopy-robotic surgery on 6/8  . CORONARY ANGIOPLASTY WITH STENT PLACEMENT    . CORONARY ARTERY BYPASS GRAFT  02/26/2012   Roxan Hockey, MD;  Location: [place];  Service: Open Heart Surgery;  Laterality: N/A;  CABG x three,  using left internal mammary artery  and left leg greater saphenous vein,   . ENDOVEIN HARVEST OF GREATER SAPHENOUS VEIN  02/26/2012  . EUS N/A 03/24/2014   Procedure: LOWER ENDOSCOPIC ULTRASOUND (EUS);  Surgeon: Milus Banister, MD;  Location: [facility];  Service: Endoscopy;  Laterality: N/A;  . LEFT HEART CATHETERIZATION WITH CORONARY ANGIOGRAM N/A 02/24/2012   Procedure: LEFT HEART CATHETERIZATION WITH CORONARY ANGIOGRAM;  Surgeon: Jacolyn Reedy, MD;  Location: [facility] CATH LAB;  Service: Cardiovascular;  Laterality: N/A;   Social History: Social History   Socioeconomic History  . Marital status: Divorced    Spouse name: Not on file  . Number of children: Not on file  .  Years of education: Not on file  . Highest education level: Not on file  Occupational History  . Not on file  Tobacco Use  . Smoking status: Never Smoker  . Smokeless tobacco: Never Used  Vaping Use  . Vaping Use: Never used  Substance and Sexual Activity  . Alcohol use: No    Alcohol/week: 0.0 standard drinks  . Drug use: No  . Sexual activity: Not Currently  Other Topics Concern  . 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   Also has a dog    Enjoys reading, gardening, going to movies or just getting out with her sister   [employer] to church and bible study group on  Tuesdays.   Independent and drives   Says she copes with stress by prayer    Social Determinants of Health   Financial Resource Strain:   . Difficulty of Paying Living Expenses: Not on file  Food Insecurity:   . Worried About Charity fundraiser in the Last Year: Not on file  . Ran Out of Food in the Last Year: Not on file  Transportation Needs:   . Lack of Transportation (Medical): Not on file  . Lack of Transportation (Non-Medical): Not on file  Physical Activity:   . Days of Exercise per Week: Not on file  . Minutes of Exercise per Session: Not on file  Stress:   . Feeling of Stress : Not on file  Social Connections:   . Frequency of Communication with Friends and Family: Not on file  . Frequency of Social Gatherings with Friends and Family: Not on file  . Attends Religious Services: Not on file  . Active Member of Clubs or Organizations: Not on file  . Attends Archivist Meetings: Not on file  . Marital Status: Not on file   Family History: Family History  Problem Relation Age of Onset  . Diabetes Father   . Diabetes Brother   . Alzheimer's disease Brother 58  . Diabetes Sister   . Colon polyps Maternal Grandmother   . Colon cancer Neg Hx   . Esophageal cancer Neg Hx   . Liver cancer Neg Hx   . Pancreatic cancer Neg Hx   . Rectal cancer Neg Hx   . Stomach cancer Neg Hx    Allergies: No Known Allergies Medications: See med rec.  Review of Systems: See HPI for pertinent positives and negatives.   Objective:    General: Well Developed, well nourished, and in no acute distress.  Neuro: Alert and oriented x3.  HEENT: Normocephalic, atraumatic.  Skin: Warm and dry. Cardiac: Regular rate and rhythm, no murmurs rubs or gallops, no lower extremity edema.  Respiratory: Clear to auscultation bilaterally. Not using accessory muscles,  speaking in full sentences.  Impression and Recommendations:    1. Type 2 diabetes mellitus with vascular disease (HCC) POCT urine microalbumin completed.  Foot exam completed.  It is likely that Metformin is causing a worsening of her diarrhea and reasonable to consider stopping and/or changing to an extended release formula or a different medication.  With her sugars being well controlled and her A1c less than 7%, we will stop the Metformin for the next week to see if this has a beneficial effect on  her diarrhea.  If there is improvement in her diarrhea off of the Metformin, we may need to change to an extended release formula or discontinue it altogether and start Jardiance.  If there is no improvement in her diarrhea, the Metformin is not a contributing factor and she can continue this.  Written instructions provided to stop Metformin, carefully watch her dietary intake to avoid concentrated sweets and simple carbohydrates, and monitor diarrhea. - POCT UA - Microalbumin  2. Need for influenza vaccination Flu vaccine given in office today. - Flu Vaccine QUAD High Dose(Fluad)  Return in about 1 week (around 11/15/2019) for DM follow up. ___________________________________________ Clearnce Sorrel, DNP, APRN, FNP-BC Primary [facility] and [place]

## 2019-11-08 ENCOUNTER — Encounter: Payer: Self-pay | Admitting: Medical-Surgical

## 2019-11-08 ENCOUNTER — Ambulatory Visit (INDEPENDENT_AMBULATORY_CARE_PROVIDER_SITE_OTHER): Payer: Medicare HMO | Admitting: Medical-Surgical

## 2019-11-08 ENCOUNTER — Other Ambulatory Visit: Payer: Self-pay

## 2019-11-08 VITALS — BP 125/80 | HR 72 | Temp 98.1°F | Ht 59.0 in | Wt 129.6 lb

## 2019-11-08 DIAGNOSIS — Z23 Encounter for immunization: Secondary | ICD-10-CM

## 2019-11-08 DIAGNOSIS — E1159 Type 2 diabetes mellitus with other circulatory complications: Secondary | ICD-10-CM

## 2019-11-08 LAB — POCT UA - MICROALBUMIN
Creatinine, POC: 200 mg/dL
Microalbumin Ur, POC: 80 mg/L

## 2019-11-08 NOTE — Patient Instructions (Addendum)
Stop Metformin  Monitor sugars closely  Be careful with your diet, avoid sweets and sugars.   Influenza (Flu) Vaccine (Inactivated or Recombinant): What You Need to Know 1. Why get vaccinated? Influenza vaccine can prevent influenza (flu). Flu is a contagious disease that spreads around the [place] every year, usually between October and May. Anyone can get the flu, but it is more dangerous for some people. Infants and young children, people 27 years of age and older, pregnant women, and people with certain health conditions or a weakened immune system are at greatest risk of flu complications. Pneumonia, bronchitis, sinus infections and ear infections are examples of flu-related complications. If you have a medical condition, such as heart disease, cancer or diabetes, flu can make it worse. Flu can cause fever and chills, sore throat, muscle aches, fatigue, cough, headache, and runny or stuffy nose. Some people may have vomiting and diarrhea, though this is more common in children than adults. Each year thousands of people in the [place] die from flu, and many more are hospitalized. Flu vaccine prevents millions of illnesses and flu-related visits to the doctor each year. 2. Influenza vaccine CDC recommends everyone 81 months of age and older get vaccinated every flu season. Children 6 months through 65 years of age may need 2 doses during a single flu season. Everyone else needs only 1 dose each flu season. It takes about 2 weeks for protection to develop after vaccination. There are many flu viruses, and they are always changing. Each year a new flu vaccine is made to protect against three or four viruses that are likely to cause disease in the upcoming flu season. Even when the vaccine doesn't exactly match these viruses, it may still provide some protection. Influenza vaccine does not cause flu. Influenza vaccine may be given at the same time as other vaccines. 3. Talk with your  health care provider Tell your vaccine provider if the person getting the vaccine:  Has had an allergic reaction after a previous dose of influenza vaccine, or has any severe, life-threatening allergies.  Has ever had Guillain-Barr Syndrome (also called GBS). In some cases, your health care provider may decide to postpone influenza vaccination to a future visit. People with minor illnesses, such as a cold, may be vaccinated. People who are moderately or severely ill should usually wait until they recover before getting influenza vaccine. Your health care provider can give you more information. 4. Risks of a vaccine reaction  Soreness, redness, and swelling where shot is given, fever, muscle aches, and headache can happen after influenza vaccine.  There may be a very small increased risk of Guillain-Barr Syndrome (GBS) after inactivated influenza vaccine (the flu shot). Young children who get the flu shot along with pneumococcal vaccine (PCV13), and/or DTaP vaccine at the same time might be slightly more likely to have a seizure caused by fever. Tell your health care provider if a child who is getting flu vaccine has ever had a seizure. People sometimes faint after medical procedures, including vaccination. Tell your provider if you feel dizzy or have vision changes or ringing in the ears. As with any medicine, there is a very remote chance of a vaccine causing a severe allergic reaction, other serious injury, or death. 5. What if there is a serious problem? An allergic reaction could occur after the vaccinated person leaves the clinic. If you see signs of a severe allergic reaction (hives, swelling of the face and throat, difficulty breathing, a fast  heartbeat, dizziness, or weakness), call 9-1-1 and get the person to the nearest hospital. For other signs that concern you, call your health care provider. Adverse reactions should be reported to the Vaccine Adverse Event Reporting System (VAERS).  Your health care provider will usually file this report, or you can do it yourself. Visit the VAERS website at www.vaers.SamedayNews.es or call (201)050-8684.VAERS is only for reporting reactions, and VAERS staff do not give medical advice. 6. The National Vaccine Injury Compensation Program The [employer] Vaccine Injury Compensation Program (VICP) is a federal program that was created to compensate people who may have been injured by certain vaccines. Visit the VICP website at GoldCloset.com.ee or call 321-258-2810 to learn about the program and about filing a claim. There is a time limit to file a claim for compensation. 7. How can I learn more?  Ask your healthcare provider.  Call your local or state health department.  Contact the Centers for Disease Control and Prevention (CDC): ? Call (719) 022-8711 (1-800-CDC-INFO) or ? Visit CDC's https://gibson.com/ Vaccine Information Statement (Interim) Inactivated Influenza Vaccine (09/18/2017) This information is not intended to replace advice given to you by your health care provider. Make sure you discuss any questions you have with your health care provider. Document Revised: 05/12/2018 Document Reviewed: 09/22/2017 Elsevier Patient Education  [place].

## 2019-11-08 NOTE — Telephone Encounter (Signed)
Pt is scheduled for an OV with Joy today at 4:00 PM

## 2019-11-09 ENCOUNTER — Other Ambulatory Visit: Payer: Self-pay | Admitting: Medical-Surgical

## 2019-11-09 ENCOUNTER — Telehealth: Payer: Self-pay

## 2019-11-09 DIAGNOSIS — G4700 Insomnia, unspecified: Secondary | ICD-10-CM

## 2019-11-09 DIAGNOSIS — M47818 Spondylosis without myelopathy or radiculopathy, sacral and sacrococcygeal region: Secondary | ICD-10-CM

## 2019-11-09 LAB — GI PROFILE, STOOL, PCR

## 2019-11-09 LAB — PANCREATIC ELASTASE, FECAL: Pancreatic Elastase, Fecal: >500 ug Elast./g (ref >200)

## 2019-11-09 MED ORDER — TRAZODONE HCL 50 MG PO TABS: 25.0000 mg | ORAL_TABLET | Freq: Every evening | ORAL | 3 refills | Status: DC | PRN

## 2019-11-09 NOTE — Telephone Encounter (Signed)
Pt called and wanted to know how to stop taking the metformin and start the new medication. I reviewed her office note from her 11/08/2019 visit and read to her that what Caryl Asp had said was for her to stop taking the metformin and then follow up in 1 week, and that if the diarrhea had improved that we would need to look at starting her on an XR form or a different medication altogether. Pt is scheduled for a follow up on 11/15/2019 at 4:00 PM. Pt verbalized understanding that she will stop the metformin and then follow up on 11/15/2019 before starting any new medications. No further questions or concerns at this time.

## 2019-11-09 NOTE — Telephone Encounter (Signed)
This encounter was created in error - please disregard.

## 2019-11-09 NOTE — Telephone Encounter (Signed)
Pt called requesting a refill of trazodone 50 mg, 0.5-1 tab qhs prn. It was last written on 05/31/2019 for #30 with 3 refills by Dr. Nani Ravens, her former PCP. The Rx has been tee'd up below and is ready for review and approval/denial.

## 2019-11-11 ENCOUNTER — Ambulatory Visit: Payer: Medicare HMO | Admitting: Medical-Surgical

## 2019-11-15 ENCOUNTER — Ambulatory Visit (INDEPENDENT_AMBULATORY_CARE_PROVIDER_SITE_OTHER): Payer: Medicare HMO | Admitting: Medical-Surgical

## 2019-11-15 ENCOUNTER — Encounter: Payer: Self-pay | Admitting: Medical-Surgical

## 2019-11-15 VITALS — BP 136/77 | HR 61 | Temp 97.9°F | Ht 59.0 in | Wt 128.0 lb

## 2019-11-15 DIAGNOSIS — E1159 Type 2 diabetes mellitus with other circulatory complications: Secondary | ICD-10-CM | POA: Diagnosis not present

## 2019-11-15 DIAGNOSIS — R197 Diarrhea, unspecified: Secondary | ICD-10-CM | POA: Diagnosis not present

## 2019-11-15 DIAGNOSIS — E559 Vitamin D deficiency, unspecified: Secondary | ICD-10-CM

## 2019-11-15 DIAGNOSIS — F418 Other specified anxiety disorders: Secondary | ICD-10-CM

## 2019-11-15 DIAGNOSIS — M47818 Spondylosis without myelopathy or radiculopathy, sacral and sacrococcygeal region: Secondary | ICD-10-CM

## 2019-11-15 LAB — POCT GLYCOSYLATED HEMOGLOBIN (HGB A1C): Hemoglobin A1C: 6.4 % — AB (ref 4.0–5.6)

## 2019-11-15 MED ORDER — TRAMADOL HCL 50 MG PO TABS: ORAL_TABLET | ORAL | 0 refills | Status: DC

## 2019-11-15 MED ORDER — AMITRIPTYLINE HCL 25 MG PO TABS: 25.0000 mg | ORAL_TABLET | Freq: Every day | ORAL | 0 refills | Status: DC

## 2019-11-15 MED ORDER — METOPROLOL TARTRATE 25 MG PO TABS: ORAL_TABLET | ORAL | 2 refills | Status: DC

## 2019-11-15 MED ORDER — DIPHENOXYLATE-ATROPINE 2.5-0.025 MG PO TABS: ORAL_TABLET | ORAL | 0 refills | Status: DC

## 2019-11-15 MED ORDER — ATORVASTATIN CALCIUM 10 MG PO TABS: ORAL_TABLET | ORAL | 3 refills | Status: DC

## 2019-11-15 MED ORDER — VITAMIN D (ERGOCALCIFEROL) 1.25 MG (50000 UNIT) PO CAPS: ORAL_CAPSULE | ORAL | 5 refills | Status: DC

## 2019-11-15 MED ORDER — ARIPIPRAZOLE 10 MG PO TABS: 10.0000 mg | ORAL_TABLET | Freq: Every day | ORAL | 2 refills | Status: DC

## 2019-11-15 MED ORDER — EMPAGLIFLOZIN 10 MG PO TABS: 10.0000 mg | ORAL_TABLET | Freq: Every day | ORAL | 0 refills | Status: DC

## 2019-11-15 NOTE — Progress Notes (Signed)
Subjective:    CC: 1 week DM/diarrhea follow up  HPI: Pleasant 69 year old female accompanied by her son presenting for 1 week follow-up of diabetes and diarrhea.  Last week, we discussed stopping her Metformin for a week to see if her diarrhea improved.  She has been off of Metformin for the past 7 days and notes that she has had no further diarrhea.  Her fasting morning sugars have ranged from 152-269.  Her current hemoglobin A1c is 6.4%.  Anxiety/depression-there was some confusion at her last appointment regarding which antidepressant she was taking.  We had both sertraline 50 mg daily and Lexapro 10 mg daily and her medication list.  Today her son reports that she has been taking sertraline 50 mg daily.  She was previously prescribed Lexapro 10 mg daily and preferred to take this medication but the last time she called for refill, she was given sertraline 50 mg instead.  She has been taking this daily and continues to battle with anxiety and depression.  Her son notes that on the Lexapro, she also stayed anxious and depressed.  Patient notes that she is more anxious than depressed.  Notes that her anxiety is triggered by not having things that she likes to do and not being able to do the things that she used to love.  Taking hydroxyzine 25 mg approximately twice daily for anxiety with minimal relief.  Notes that she does not have any interest or know of anything that she enjoys to do.  Was able to go to church this past weekend since her diarrhea had resolved and reports she was very happy to do that.  Admits to sleeping poorly.  She goes to bed around 11 PM at night and usually is not asleep until 1-4 AM.  Once she is asleep she will not wake until around noon or later the next day.  She does take trazodone at night but this is not been helpful.  I reviewed the past medical history, family history, social history, surgical history, and allergies today and no changes were needed.  Please see the  problem list section below in epic for further details.  Past Medical History: Past Medical History:  Diagnosis Date  .  Paroxysmal SVT (ANVRT)    S/p AV nodal ablation Dr. Lovena Le 09/28/08   . Anxiety   . CAD    Coronary disease status post non-ST elevation MI in May 2010 with  PCI of the left circumflex on Jun 24, 2008. She then had a PCI of  the RCA on July 15, 2008. 02/24/12 Cath, Severe 95% LAD, ostial 95% circ, ostial RCA, patent stents in distal RCA and mid circ normal LV 02/26/12 CABG with LIMA to LAD, SVG to RCA, and SVG to OM Dr. Roxan Hockey    . Cancer [facility])    adenocarcinoma of rectum  . Cataract   . Depression   . Diabetes mellitus, insulin dependent (IDDM), uncontrolled   . Heart murmur   . Hyperlipidemia   . Hypertension   . Hypertensive heart disease   . S/P radiation therapy 04/11/14-05/18/14   50.4Gy rectal  . Vertigo    Result of chemotherapy; has been to ENT and told nothing to be done   Past Surgical History: Past Surgical History:  Procedure Laterality Date  . brain tumor removed     2019  . COLON SURGERY     2016  . COLONOSCOPY    . COLONOSCOPY WITH PROPOFOL N/A 07/12/2014   Procedure: COLONOSCOPY WITH  PROPOFOL POSSIBLE POLYP RESECTION;  Surgeon: [name], MD;  Location: [facility];  Service: Endoscopy;  Laterality: N/A;   to be admitted after colonoscopy-robotic surgery on 6/8  . CORONARY ANGIOPLASTY WITH STENT PLACEMENT    . CORONARY ARTERY BYPASS GRAFT  02/26/2012   Roxan Hockey, MD;  Location: [place];  Service: Open Heart Surgery;  Laterality: N/A;  CABG x three,  using left internal mammary artery  and left leg greater saphenous vein,   . ENDOVEIN HARVEST OF GREATER SAPHENOUS VEIN  02/26/2012  . EUS N/A 03/24/2014   Procedure: LOWER ENDOSCOPIC ULTRASOUND (EUS);  Surgeon: Milus Banister, MD;  Location: [facility];  Service: Endoscopy;  Laterality: N/A;  . LEFT HEART CATHETERIZATION WITH CORONARY ANGIOGRAM N/A 02/24/2012   Procedure: LEFT HEART  CATHETERIZATION WITH CORONARY ANGIOGRAM;  Surgeon: Jacolyn Reedy, MD;  Location[facility] CATH LAB;  Service: Cardiovascular;  Laterality: N/A;   Social History: Social History   Socioeconomic History  . Marital status: Divorced    Spouse name: Not on file  . Number of children: Not on file  . Years of education: Not on file  . Highest education level: Not on file  Occupational History  . Not on file  Tobacco Use  . Smoking status: Never Smoker  . Smokeless tobacco: Never Used  Vaping Use  . Vaping Use: Never used  Substance and Sexual Activity  . Alcohol use: No    Alcohol/week: 0.0 standard drinks  . Drug use: No  . Sexual activity: Not Currently  Other Topics Concern  . 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   Also has a dog   Enjoys reading, gardening, going to movies or just getting out with her sister   [employer] to church and bible study group on  Tuesdays.   Independent and drives   Says she copes with stress by prayer    Social Determinants of Health   Financial Resource Strain:   . Difficulty of Paying Living Expenses: Not on file  Food Insecurity:   . Worried About Charity fundraiser in the Last Year: Not on file  . Ran Out of Food in the Last Year: Not on file  Transportation Needs:   . Lack of Transportation (Medical): Not on file  . Lack of Transportation (Non-Medical): Not on file  Physical Activity:   . Days of Exercise per Week: Not on file  . Minutes of Exercise per Session: Not on file  Stress:   . Feeling of Stress : Not on file  Social Connections:   . Frequency of Communication with Friends and Family: Not on file  . Frequency of Social Gatherings with Friends and Family: Not on file  . Attends Religious Services: Not on file  . Active Member of Clubs or  Organizations: Not on file  . Attends Archivist Meetings: Not on file  . Marital Status: Not on file   Family History: Family History  Problem Relation Age of Onset  . Diabetes Father   . Diabetes Brother   . Alzheimer's disease Brother 42  . Diabetes Sister   . Colon polyps Maternal Grandmother   . Colon cancer Neg Hx   . Esophageal cancer Neg Hx   . Liver cancer Neg  Hx   . Pancreatic cancer Neg Hx   . Rectal cancer Neg Hx   . Stomach cancer Neg Hx    Allergies: No Known Allergies Medications: See med rec.  Review of Systems: See HPI for pertinent positives and negatives.   Objective:    General: Well Developed, well nourished, and in no acute distress.  Neuro: Alert and oriented x3.  HEENT: Normocephalic, atraumatic.  Skin: Warm and dry. Cardiac: Regular rate and rhythm, no murmurs rubs or gallops, no lower extremity edema.  Respiratory: Clear to auscultation bilaterally. Not using accessory muscles, speaking in full sentences.   Impression and Recommendations:    1. Type 2 diabetes mellitus with vascular disease (HCC) We are completely stopping Metformin and adding this to her intolerance/allergy list.  Starting Jardiance 10 mg daily.  Discussed continued monitoring of fasting glucose in the mornings and avoidance of concentrated sweets. - POCT glycosylated hemoglobin (Hb A1C)  2. Diarrhea, unspecified type Okay to use Lomotil as needed if diarrhea develops again.  3. Anxiety with depression Discussed various options for treatment of anxiety with depression.  Suspect a lot of her anxiety is related to limitation in activity due to physical status and recent diarrhea.  Encouraged patient to try new things on a regular basis to see if there are some things she likes to do.  Discussed limitations of medication and treating anxiety that is related to a situation such as this.  As sertraline and Lexapro were not effective in helping control her symptoms, the son  would like to try other options.  After review of her medication list, discussed switching to either Celexa or trialing a tricyclic antidepressant such as a low-dose amitriptyline to help with sleep at night.  The patient and her son agreed that they would like to try low-dose TCA.  Sending in amitriptyline 25 mg nightly.  Discontinue trazodone and hydroxyzine.  Updated medication list provided to patient's son who states he will go home and discard any medications not on that list.  Ambulatory referral to geriatric psych entered as I believe she will need further evaluation and management. - Ambulatory referral to Psychiatry  Patient's son has requested her prescriptions be changed over to [facility].  She is due for refills on most of her medications so sending in refills of the rest of her medicines as requested.  If there is any issue with getting these filled, advised the son to let me know.  Return in about 4 weeks (around 12/13/2019) for mood follow up. ___________________________________________ Clearnce Sorrel, DNP, APRN, FNP-BC Primary Care and [place]

## 2019-11-16 ENCOUNTER — Other Ambulatory Visit: Payer: Self-pay | Admitting: Family Medicine

## 2019-11-16 DIAGNOSIS — F418 Other specified anxiety disorders: Secondary | ICD-10-CM

## 2019-11-22 ENCOUNTER — Telehealth: Payer: Self-pay

## 2019-11-22 NOTE — Telephone Encounter (Signed)
Her sugars are going to be elevated if she is noncompliant with her diabetic diet recommendations. While anxiety is not something to brush off, it does not give her license to eat sweets and sugary foods. She needs to cut out the foods that increase her sugar. We discussed finding some activities that she might find interesting and will keep her occupied. Has she tried that? At her age, she should not be on multiple medications that have a sedative effect due to safety concerns. It appears she got a refill of the Lexapro from her old doctor on 10/12. She should not be taking this along with the Amitriptyline. There may be a period of increased anxiety while making medication changes but she should avoid behaviors (overeating, poor dietary choices, etc.) that can and will worsen her chronic health conditions. We are working on the Geriatric Psych referral.

## 2019-11-22 NOTE — Telephone Encounter (Signed)
Pt called and LVM stating her blood sugars and anxiety have been elevated since her OV last week.   Called and spoke with Katharine Look who said that her blood sugar readings are ranging from 168-286. She said that she started both Jardiance and amitriptyline on 11/16/2019, the day after her appt last week. She said that she has not really changed her diet, that she is actually eating more because of her increased anxiety. Pt states that she is still eating sugary foods and high carb foods. She states "I just don't know what to do with all this anxiety".

## 2019-11-23 NOTE — Telephone Encounter (Signed)
Pt states she has not tried any of the activities to keep her occupied. Instructed her to review the notes she was given and do these things instead of turning to food. Instructed her to stop the Lexapro. Pt verbalized understanding. No further questions or concerns at this time.

## 2019-12-13 ENCOUNTER — Ambulatory Visit: Payer: Medicare HMO | Admitting: Medical-Surgical

## 2019-12-13 ENCOUNTER — Ambulatory Visit (INDEPENDENT_AMBULATORY_CARE_PROVIDER_SITE_OTHER): Payer: Medicare HMO | Admitting: Medical-Surgical

## 2019-12-13 ENCOUNTER — Encounter: Payer: Self-pay | Admitting: Medical-Surgical

## 2019-12-13 VITALS — BP 112/71 | HR 61 | Temp 97.8°F | Ht 59.0 in | Wt 131.1 lb

## 2019-12-13 DIAGNOSIS — Z8659 Personal history of other mental and behavioral disorders: Secondary | ICD-10-CM

## 2019-12-13 DIAGNOSIS — E1159 Type 2 diabetes mellitus with other circulatory complications: Secondary | ICD-10-CM | POA: Diagnosis not present

## 2019-12-13 DIAGNOSIS — F418 Other specified anxiety disorders: Secondary | ICD-10-CM | POA: Diagnosis not present

## 2019-12-13 DIAGNOSIS — R413 Other amnesia: Secondary | ICD-10-CM

## 2019-12-13 DIAGNOSIS — I119 Hypertensive heart disease without heart failure: Secondary | ICD-10-CM

## 2019-12-13 DIAGNOSIS — G4700 Insomnia, unspecified: Secondary | ICD-10-CM | POA: Diagnosis not present

## 2019-12-13 MED ORDER — AMITRIPTYLINE HCL 50 MG PO TABS: 50.0000 mg | ORAL_TABLET | Freq: Every day | ORAL | 1 refills | Status: DC

## 2019-12-13 MED ORDER — EMPAGLIFLOZIN 25 MG PO TABS: 25.0000 mg | ORAL_TABLET | Freq: Every day | ORAL | 3 refills | Status: DC

## 2019-12-13 NOTE — Patient Instructions (Addendum)
Please call the number for Medicaid on the back of the insurance card and find out who they have listed in network for Geriatric Psychiatry. Let me know who they have in network and I will get the referral over to them.   Look into the [employer] program. Also check out the [employer] in [place] for senior citizen activities.   Increase Jardiance to 20mg  (2 tablets) daily until your supply is gone. Once that is gone, start the new prescription of 25mg  daily (1 tablet) daily.   Increase Amitriptyline to 50mg  nightly (2 tablets). New prescription was sent for 50mg  tablets so when her current supply runs out, she will change to 1 tablet nightly.

## 2019-12-13 NOTE — Progress Notes (Signed)
Subjective:    CC: anxiety/depression follow up  HPI: Pleasant 69 year old female presenting for follow up on anxiety and depression.   Anxiety/depression- At our last visit we discontinued hydroxyzine, lexapro, sertraline, and trazadone. She was started on Amitriptyline 25mg  nightly and referred to geriatric psychiatry. She called a week later reporting she was using food for comfort and her glucose was elevated. She was advised to avoid eating for comfort because of the negative effect on weight and diabetes. Advised to look into activities that will keep her occupied. So far, she has not done this. She is tolerating the Amitriptyline well without side effects. Notes that there have been some improvements in her symptoms but she is still anxious a lot. She has not tried any activities or found anything she would like to do to keep herself busy. She is still overeating and snacks on crackers frequently. Notes that she has a hard time going to sleep but sleeps for a while once there. Her son reports she is telling him differently and reporting choppy sleep. Tends to fall asleep in early morning hours and sleep through the day.   DM- Sugars every morning between 160s-210s. Not compliant with diabetic diet.  HTN- BP at goal today.   I reviewed the past medical history, family history, social history, surgical history, and allergies today and no changes were needed.  Please see the problem list section below in epic for further details.  Past Medical History: Past Medical History:  Diagnosis Date  .  Paroxysmal SVT (ANVRT)    S/p AV nodal ablation Dr. Lovena Le 09/28/08   . Anxiety   . CAD    Coronary disease status post non-ST elevation MI in May 2010 with  PCI of the left circumflex on Jun 24, 2008. She then had a PCI of  the RCA on July 15, 2008. 02/24/12 Cath, Severe 95% LAD, ostial 95% circ, ostial RCA, patent stents in distal RCA and mid circ normal LV 02/26/12 CABG with LIMA to LAD, SVG to RCA,  and SVG to OM Dr. Roxan Hockey    . Cancer [facility])    adenocarcinoma of rectum  . Cataract   . Depression   . Diabetes mellitus, insulin dependent (IDDM), uncontrolled   . Heart murmur   . Hyperlipidemia   . Hypertension   . Hypertensive heart disease   . S/P radiation therapy 04/11/14-05/18/14   50.4Gy rectal  . Vertigo    Result of chemotherapy; has been to ENT and told nothing to be done   Past Surgical History: Past Surgical History:  Procedure Laterality Date  . brain tumor removed     2019  . COLON SURGERY     2016  . COLONOSCOPY    . COLONOSCOPY WITH PROPOFOL N/A 07/12/2014   Procedure: COLONOSCOPY WITH PROPOFOL POSSIBLE POLYP RESECTION;  Surgeon: [name], MD;  Location: [facility];  Service: Endoscopy;  Laterality: N/A;   to be admitted after colonoscopy-robotic surgery on 6/8  . CORONARY ANGIOPLASTY WITH STENT PLACEMENT    . CORONARY ARTERY BYPASS GRAFT  02/26/2012   Roxan Hockey, MD;  Location: [place];  Service: Open Heart Surgery;  Laterality: N/A;  CABG x three,  using left internal mammary artery  and left leg greater saphenous vein,   . ENDOVEIN HARVEST OF GREATER SAPHENOUS VEIN  02/26/2012  . EUS N/A 03/24/2014   Procedure: LOWER ENDOSCOPIC ULTRASOUND (EUS);  Surgeon: Milus Banister, MD;  Location: [facility];  Service: Endoscopy;  Laterality: N/A;  . LEFT  HEART CATHETERIZATION WITH CORONARY ANGIOGRAM N/A 02/24/2012   Procedure: LEFT HEART CATHETERIZATION WITH CORONARY ANGIOGRAM;  Surgeon: Jacolyn Reedy, MD;  Location: [facility] CATH LAB;  Service: Cardiovascular;  Laterality: N/A;   Social History: Social History   Socioeconomic History  . Marital status: Divorced    Spouse name: Not on file  . Number of children: Not on file  . Years of education: Not on file  . Highest education level: Not on file  Occupational History  . Not on file  Tobacco Use  . Smoking status: Never Smoker  . Smokeless tobacco: Never Used  Vaping Use  . Vaping Use: Never used   Substance and Sexual Activity  . Alcohol use: No    Alcohol/week: 0.0 standard drinks  . Drug use: No  . Sexual activity: Not Currently  Other Topics Concern  . 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   Also has a dog   Enjoys reading, gardening, going to movies or just getting out with her sister   [employer] to church and bible study group on  Tuesdays.   Independent and drives   Says she copes with stress by prayer    Social Determinants of Health   Financial Resource Strain:   . Difficulty of Paying Living Expenses: Not on file  Food Insecurity:   . Worried About Charity fundraiser in the Last Year: Not on file  . Ran Out of Food in the Last Year: Not on file  Transportation Needs:   . Lack of Transportation (Medical): Not on file  . Lack of Transportation (Non-Medical): Not on file  Physical Activity:   . Days of Exercise per Week: Not on file  . Minutes of Exercise per Session: Not on file  Stress:   . Feeling of Stress : Not on file  Social Connections:   . Frequency of Communication with Friends and Family: Not on file  . Frequency of Social Gatherings with Friends and Family: Not on file  . Attends Religious Services: Not on file  . Active Member of Clubs or Organizations: Not on file  . Attends Archivist Meetings: Not on file  . Marital Status: Not on file   Family History: Family History  Problem Relation Age of Onset  . Diabetes Father   . Diabetes Brother   . Alzheimer's disease Brother 26  . Diabetes Sister   . Colon polyps Maternal Grandmother   . Colon cancer Neg Hx   . Esophageal cancer Neg Hx   . Liver cancer Neg Hx   . Pancreatic cancer Neg Hx   . Rectal cancer Neg Hx   . Stomach cancer Neg Hx    Allergies: No Known Allergies Medications: See med  rec.  Review of Systems: See HPI for pertinent positives and negatives.   Depression screen [facility], [facility], [facility] 2/9 12/13/2019 11/08/2019 08/10/2019 10/27/2017 04/12/2016  Decreased Interest 3 2 0 0 -  Down, Depressed, Hopeless 3 1 0 0 0  PHQ - 2 Score 6 3 0 0 0  Altered sleeping 2 3 3  - -  Tired, decreased energy 1 3 0 - -  Change in appetite 1 2 0 - -  Feeling bad or failure about yourself  3 3 3  - -  Trouble concentrating 1 2 1  - -  Moving slowly or fidgety/restless 0 0 0 - -  Suicidal thoughts 0 0 0 - -  PHQ-9 Score 14 16 7  - -  Difficult doing work/chores Somewhat difficult Somewhat difficult Somewhat difficult - -  Some recent data might be hidden   GAD 7 : Generalized Anxiety Score 12/13/2019 11/08/2019 08/10/2019  Nervous, Anxious, on Edge 2 1 3   Control/stop worrying 1 1 1   Worry too much - different things 1 1 1   Trouble relaxing 1 2 3   Restless 1 1 2   Easily annoyed or irritable 1 0 0  Afraid - awful might happen 0 0 1  Total GAD 7 Score 7 6 11   Anxiety Difficulty Somewhat difficult Somewhat difficult Somewhat difficult   Objective:    General: Well Developed, well nourished, and in no acute distress.  Neuro: Alert and oriented x3.  HEENT: Normocephalic, atraumatic.  Skin: Warm and dry. Cardiac: Regular rate and rhythm, no murmurs rubs or gallops, no lower extremity edema.  Respiratory: Clear to auscultation bilaterally. Not using accessory muscles, speaking in full sentences.  Impression and Recommendations:    1. Type 2 diabetes mellitus with vascular disease ([place]) Increasing Jardiance to 2 tablets (20mg ) daily until supply runs out at home. Once that is gone, start new prescription for Jardiance 55m daily. Continue to monitor blood sugars. Work on dietary compliance with diabetic diet recommendations.   2. Hypertensive heart disease without heart failure BP at goal today. Continue Lopressor.   3. Anxiety with depression Increase Amitriptyline to 50mg  daily. Ok to take 2 tablets of  current supply until gone then start new prescription of 1 50mg  tablet daily. Monitor for side effects or worsening confusion. Ultimately, think she needs to be managed by Geriatric Psychiatry but her insurance has caused difficulty getting her in with the providers we usually send to. Advised son to contact Medicaid at the number on the back of the card to find out who is in network for Geriatric Psychiatry. Once he has this information, requested he let us know so a referral can be sent over. Advised patient that some of her symptoms with be helped by medication but she is also going to have to make behavioral changes. Requested she try to find activities that she enjoys to keep her occupied.  4. Insomnia, unspecified type Increasing Amitriptyline to 50mg  daily.   5. History of depression See above.  6. Memory impairment Her son is helping to manage her medications but he is sleeping during the day because of working 3rd shift. Discussed possible home health and he would like to try that. Referring to Home health for medication management and to evaluate for potential home needs.  - Ambulatory referral to [place]  Return in about 4 weeks (around 01/10/2020) for mood follow up. ___________________________________________ Clearnce Sorrel, DNP, APRN, FNP-BC [facility] and [place]

## 2019-12-14 DIAGNOSIS — I480 Paroxysmal atrial fibrillation: Secondary | ICD-10-CM | POA: Diagnosis not present

## 2019-12-14 DIAGNOSIS — E1159 Type 2 diabetes mellitus with other circulatory complications: Secondary | ICD-10-CM | POA: Diagnosis not present

## 2019-12-14 DIAGNOSIS — I252 Old myocardial infarction: Secondary | ICD-10-CM | POA: Diagnosis not present

## 2019-12-14 DIAGNOSIS — R011 Cardiac murmur, unspecified: Secondary | ICD-10-CM | POA: Diagnosis not present

## 2019-12-14 DIAGNOSIS — C20 Malignant neoplasm of rectum: Secondary | ICD-10-CM | POA: Diagnosis not present

## 2019-12-14 DIAGNOSIS — I2510 Atherosclerotic heart disease of native coronary artery without angina pectoris: Secondary | ICD-10-CM | POA: Diagnosis not present

## 2019-12-14 DIAGNOSIS — F32A Depression, unspecified: Secondary | ICD-10-CM | POA: Diagnosis not present

## 2019-12-14 DIAGNOSIS — I119 Hypertensive heart disease without heart failure: Secondary | ICD-10-CM | POA: Diagnosis not present

## 2019-12-14 DIAGNOSIS — F419 Anxiety disorder, unspecified: Secondary | ICD-10-CM | POA: Diagnosis not present

## 2019-12-14 DIAGNOSIS — E119 Type 2 diabetes mellitus without complications: Secondary | ICD-10-CM | POA: Diagnosis not present

## 2019-12-16 ENCOUNTER — Ambulatory Visit: Payer: Medicare HMO | Admitting: Medical-Surgical

## 2019-12-21 DIAGNOSIS — I252 Old myocardial infarction: Secondary | ICD-10-CM | POA: Diagnosis not present

## 2019-12-21 DIAGNOSIS — R011 Cardiac murmur, unspecified: Secondary | ICD-10-CM | POA: Diagnosis not present

## 2019-12-21 DIAGNOSIS — C20 Malignant neoplasm of rectum: Secondary | ICD-10-CM | POA: Diagnosis not present

## 2019-12-21 DIAGNOSIS — I119 Hypertensive heart disease without heart failure: Secondary | ICD-10-CM | POA: Diagnosis not present

## 2019-12-21 DIAGNOSIS — I2510 Atherosclerotic heart disease of native coronary artery without angina pectoris: Secondary | ICD-10-CM | POA: Diagnosis not present

## 2019-12-21 DIAGNOSIS — I480 Paroxysmal atrial fibrillation: Secondary | ICD-10-CM | POA: Diagnosis not present

## 2019-12-21 DIAGNOSIS — F419 Anxiety disorder, unspecified: Secondary | ICD-10-CM | POA: Diagnosis not present

## 2019-12-21 DIAGNOSIS — E1159 Type 2 diabetes mellitus with other circulatory complications: Secondary | ICD-10-CM | POA: Diagnosis not present

## 2019-12-21 DIAGNOSIS — F32A Depression, unspecified: Secondary | ICD-10-CM | POA: Diagnosis not present

## 2019-12-28 DIAGNOSIS — F32A Depression, unspecified: Secondary | ICD-10-CM | POA: Diagnosis not present

## 2019-12-28 DIAGNOSIS — F419 Anxiety disorder, unspecified: Secondary | ICD-10-CM | POA: Diagnosis not present

## 2019-12-28 DIAGNOSIS — I480 Paroxysmal atrial fibrillation: Secondary | ICD-10-CM | POA: Diagnosis not present

## 2019-12-28 DIAGNOSIS — R011 Cardiac murmur, unspecified: Secondary | ICD-10-CM | POA: Diagnosis not present

## 2019-12-28 DIAGNOSIS — I252 Old myocardial infarction: Secondary | ICD-10-CM | POA: Diagnosis not present

## 2019-12-28 DIAGNOSIS — I2510 Atherosclerotic heart disease of native coronary artery without angina pectoris: Secondary | ICD-10-CM | POA: Diagnosis not present

## 2019-12-28 DIAGNOSIS — E1159 Type 2 diabetes mellitus with other circulatory complications: Secondary | ICD-10-CM | POA: Diagnosis not present

## 2019-12-28 DIAGNOSIS — C20 Malignant neoplasm of rectum: Secondary | ICD-10-CM | POA: Diagnosis not present

## 2019-12-28 DIAGNOSIS — I119 Hypertensive heart disease without heart failure: Secondary | ICD-10-CM | POA: Diagnosis not present

## 2020-01-04 ENCOUNTER — Other Ambulatory Visit: Payer: Self-pay | Admitting: Family Medicine

## 2020-01-06 DIAGNOSIS — E1159 Type 2 diabetes mellitus with other circulatory complications: Secondary | ICD-10-CM | POA: Diagnosis not present

## 2020-01-06 DIAGNOSIS — I252 Old myocardial infarction: Secondary | ICD-10-CM | POA: Diagnosis not present

## 2020-01-06 DIAGNOSIS — F419 Anxiety disorder, unspecified: Secondary | ICD-10-CM | POA: Diagnosis not present

## 2020-01-06 DIAGNOSIS — F32A Depression, unspecified: Secondary | ICD-10-CM | POA: Diagnosis not present

## 2020-01-06 DIAGNOSIS — C20 Malignant neoplasm of rectum: Secondary | ICD-10-CM | POA: Diagnosis not present

## 2020-01-06 DIAGNOSIS — R011 Cardiac murmur, unspecified: Secondary | ICD-10-CM | POA: Diagnosis not present

## 2020-01-06 DIAGNOSIS — I119 Hypertensive heart disease without heart failure: Secondary | ICD-10-CM | POA: Diagnosis not present

## 2020-01-06 DIAGNOSIS — I480 Paroxysmal atrial fibrillation: Secondary | ICD-10-CM | POA: Diagnosis not present

## 2020-01-06 DIAGNOSIS — I2510 Atherosclerotic heart disease of native coronary artery without angina pectoris: Secondary | ICD-10-CM | POA: Diagnosis not present

## 2020-01-08 ENCOUNTER — Other Ambulatory Visit: Payer: Self-pay | Admitting: Family Medicine

## 2020-01-10 ENCOUNTER — Encounter: Payer: Self-pay | Admitting: Medical-Surgical

## 2020-01-10 ENCOUNTER — Ambulatory Visit (INDEPENDENT_AMBULATORY_CARE_PROVIDER_SITE_OTHER): Payer: Medicare HMO | Admitting: Medical-Surgical

## 2020-01-10 VITALS — BP 101/66 | HR 78 | Temp 97.8°F | Ht 59.0 in | Wt 129.3 lb

## 2020-01-10 DIAGNOSIS — F418 Other specified anxiety disorders: Secondary | ICD-10-CM

## 2020-01-10 NOTE — Progress Notes (Signed)
Subjective:    CC: mood/insomnia follow up  HPI: Pleasant 69 year old female presenting today for mood and insomnia follow up. Reports she is feeling better on the Amitriptyline 50mg  dose at bedtime and that she has more energy and interest in doing things. Notes that she even got her Christmas tree up yesterday and that a month ago, she would not have even thought about doing something like that. Denies SI/HI.   Still having a bit of trouble with regulating her sleep. Taking the Amitriptyline when she goes to bed around midnight but sometimes it takes hours for her to finally go to sleep. Once she is asleep, she tends to oversleep. Yesterday, reports waking up at around 1pm.   I reviewed the past medical history, family history, social history, surgical history, and allergies today and no changes were needed.  Please see the problem list section below in epic for further details.  Past Medical History: Past Medical History:  Diagnosis Date  .  Paroxysmal SVT (ANVRT)    S/p AV nodal ablation Dr. Lovena Le 09/28/08   . Anxiety   . CAD    Coronary disease status post non-ST elevation MI in May 2010 with  PCI of the left circumflex on Jun 24, 2008. She then had a PCI of  the RCA on July 15, 2008. 02/24/12 Cath, Severe 95% LAD, ostial 95% circ, ostial RCA, patent stents in distal RCA and mid circ normal LV 02/26/12 CABG with LIMA to LAD, SVG to RCA, and SVG to OM Dr. Roxan Hockey    . Cancer [facility])    adenocarcinoma of rectum  . Cataract   . Depression   . Diabetes mellitus, insulin dependent (IDDM), uncontrolled   . Heart murmur   . Hyperlipidemia   . Hypertension   . Hypertensive heart disease   . S/P radiation therapy 04/11/14-05/18/14   50.4Gy rectal  . Vertigo    Result of chemotherapy; has been to ENT and told nothing to be done   Past Surgical History: Past Surgical History:  Procedure Laterality Date  . brain tumor removed     2019  . COLON SURGERY     2016  . COLONOSCOPY    .  COLONOSCOPY WITH PROPOFOL N/A 07/12/2014   Procedure: COLONOSCOPY WITH PROPOFOL POSSIBLE POLYP RESECTION;  Surgeon: [name], MD;  [facility];  Service: Endoscopy;  Laterality: N/A;   to be admitted after colonoscopy-robotic surgery on 6/8  . CORONARY ANGIOPLASTY WITH STENT PLACEMENT    . CORONARY ARTERY BYPASS GRAFT  02/26/2012   Roxan Hockey, MD;  Location: [place];  Service: Open Heart Surgery;  Laterality: N/A;  CABG x three,  using left internal mammary artery  and left leg greater saphenous vein,   . ENDOVEIN HARVEST OF GREATER SAPHENOUS VEIN  02/26/2012  . EUS N/A 03/24/2014   Procedure: LOWER ENDOSCOPIC ULTRASOUND (EUS);  Surgeon: Milus Banister, MD;  Location: [facility];  Service: Endoscopy;  Laterality: N/A;  . LEFT HEART CATHETERIZATION WITH CORONARY ANGIOGRAM N/A 02/24/2012   Procedure: LEFT HEART CATHETERIZATION WITH CORONARY ANGIOGRAM;  Surgeon: Jacolyn Reedy, MD;  Location: [facility] CATH LAB;  Service: Cardiovascular;  Laterality: N/A;   Social History: Social History   Socioeconomic History  . Marital status: Divorced    Spouse name: Not on file  . Number of children: Not on file  . Years of education: Not on file  . Highest education level: Not on file  Occupational History  . Not on file  Tobacco Use  .  Smoking status: Never Smoker  . Smokeless tobacco: Never Used  Vaping Use  . Vaping Use: Never used  Substance and Sexual Activity  . Alcohol use: No    Alcohol/week: 0.0 standard drinks  . Drug use: No  . Sexual activity: Not Currently  Other Topics Concern  . 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   Also has a dog   Enjoys reading, gardening, going to movies or just getting out with her sister   [employer] to church and bible study group on  Tuesdays.   Independent  and drives   Says she copes with stress by prayer    Social Determinants of Health   Financial Resource Strain:   . Difficulty of Paying Living Expenses: Not on file  Food Insecurity:   . Worried About Charity fundraiser in the Last Year: Not on file  . Ran Out of Food in the Last Year: Not on file  Transportation Needs:   . Lack of Transportation (Medical): Not on file  . Lack of Transportation (Non-Medical): Not on file  Physical Activity:   . Days of Exercise per Week: Not on file  . Minutes of Exercise per Session: Not on file  Stress:   . Feeling of Stress : Not on file  Social Connections:   . Frequency of Communication with Friends and Family: Not on file  . Frequency of Social Gatherings with Friends and Family: Not on file  . Attends Religious Services: Not on file  . Active Member of Clubs or Organizations: Not on file  . Attends Archivist Meetings: Not on file  . Marital Status: Not on file   Family History: Family History  Problem Relation Age of Onset  . Diabetes Father   . Diabetes Brother   . Alzheimer's disease Brother 3  . Diabetes Sister   . Colon polyps Maternal Grandmother   . Colon cancer Neg Hx   . Esophageal cancer Neg Hx   . Liver cancer Neg Hx   . Pancreatic cancer Neg Hx   . Rectal cancer Neg Hx   . Stomach cancer Neg Hx    Allergies: No Known Allergies Medications: See med rec.  Review of Systems: See HPI for pertinent positives and negatives.   Objective:    General: Well Developed, well nourished, and in no acute distress.  Neuro: Alert and oriented x3.  HEENT: Normocephalic, atraumatic.  Skin: Warm and dry. Cardiac: Regular rate and rhythm, no murmurs rubs or gallops, no lower extremity edema.  Respiratory: Clear to auscultation bilaterally. Not using accessory muscles, speaking in full sentences.   Impression and Recommendations:    1. Anxiety with depression/Insomnia Continue Amitriptyline 50mg  at bedtime.  Recommend taking the medication around 10pm to allow for falling asleep earlier and not sleeping so late in the day.   Return in about 6 weeks (around 02/21/2020) for DM follow up. ___________________________________________ Clearnce Sorrel, DNP, APRN, FNP-BC Primary [facility] and [place]

## 2020-01-13 DIAGNOSIS — F419 Anxiety disorder, unspecified: Secondary | ICD-10-CM | POA: Diagnosis not present

## 2020-01-13 DIAGNOSIS — C20 Malignant neoplasm of rectum: Secondary | ICD-10-CM | POA: Diagnosis not present

## 2020-01-13 DIAGNOSIS — I119 Hypertensive heart disease without heart failure: Secondary | ICD-10-CM | POA: Diagnosis not present

## 2020-01-13 DIAGNOSIS — I2510 Atherosclerotic heart disease of native coronary artery without angina pectoris: Secondary | ICD-10-CM | POA: Diagnosis not present

## 2020-01-13 DIAGNOSIS — F32A Depression, unspecified: Secondary | ICD-10-CM | POA: Diagnosis not present

## 2020-01-13 DIAGNOSIS — I252 Old myocardial infarction: Secondary | ICD-10-CM | POA: Diagnosis not present

## 2020-01-13 DIAGNOSIS — R011 Cardiac murmur, unspecified: Secondary | ICD-10-CM | POA: Diagnosis not present

## 2020-01-13 DIAGNOSIS — E1159 Type 2 diabetes mellitus with other circulatory complications: Secondary | ICD-10-CM | POA: Diagnosis not present

## 2020-01-13 DIAGNOSIS — I480 Paroxysmal atrial fibrillation: Secondary | ICD-10-CM | POA: Diagnosis not present

## 2020-01-25 DIAGNOSIS — F32A Depression, unspecified: Secondary | ICD-10-CM | POA: Diagnosis not present

## 2020-01-25 DIAGNOSIS — I119 Hypertensive heart disease without heart failure: Secondary | ICD-10-CM | POA: Diagnosis not present

## 2020-01-25 DIAGNOSIS — I2510 Atherosclerotic heart disease of native coronary artery without angina pectoris: Secondary | ICD-10-CM | POA: Diagnosis not present

## 2020-01-25 DIAGNOSIS — I252 Old myocardial infarction: Secondary | ICD-10-CM | POA: Diagnosis not present

## 2020-01-25 DIAGNOSIS — E1159 Type 2 diabetes mellitus with other circulatory complications: Secondary | ICD-10-CM | POA: Diagnosis not present

## 2020-01-25 DIAGNOSIS — I480 Paroxysmal atrial fibrillation: Secondary | ICD-10-CM | POA: Diagnosis not present

## 2020-01-25 DIAGNOSIS — R011 Cardiac murmur, unspecified: Secondary | ICD-10-CM | POA: Diagnosis not present

## 2020-01-25 DIAGNOSIS — C20 Malignant neoplasm of rectum: Secondary | ICD-10-CM | POA: Diagnosis not present

## 2020-01-25 DIAGNOSIS — F419 Anxiety disorder, unspecified: Secondary | ICD-10-CM | POA: Diagnosis not present

## 2020-02-01 ENCOUNTER — Telehealth: Payer: Self-pay | Admitting: Medical-Surgical

## 2020-02-01 NOTE — Telephone Encounter (Signed)
Looks like [name] has an appointment on 02/07/2020 but that will be too soon to check her A1c. Can we call to reschedule her until at least after the 12th?  Thanks, Christen Butter, FNP

## 2020-02-03 ENCOUNTER — Telehealth: Payer: Self-pay

## 2020-02-03 NOTE — Telephone Encounter (Signed)
No answer, no voicemail. Will try to call patient back at a later time. (1st attempt)   Pt is scheduled for a 6 week follow up on 02/07/2020 regarding her DM. She was instructed at her last OV on 01/10/2020 to RTC in 6 weeks, around 02/21/2020. Pt needs to have her A1C checked, but it cannot be done until on/after 02/15/2020. Need to try and contact for rescheduling, or find out if she will be willing to RTC after the 11th for her A1C.

## 2020-02-05 ENCOUNTER — Other Ambulatory Visit: Payer: Self-pay | Admitting: Medical-Surgical

## 2020-02-07 ENCOUNTER — Ambulatory Visit: Payer: Medicare HMO | Admitting: Medical-Surgical

## 2020-02-08 DIAGNOSIS — I2510 Atherosclerotic heart disease of native coronary artery without angina pectoris: Secondary | ICD-10-CM | POA: Diagnosis not present

## 2020-02-08 DIAGNOSIS — R011 Cardiac murmur, unspecified: Secondary | ICD-10-CM | POA: Diagnosis not present

## 2020-02-08 DIAGNOSIS — C20 Malignant neoplasm of rectum: Secondary | ICD-10-CM | POA: Diagnosis not present

## 2020-02-08 DIAGNOSIS — I119 Hypertensive heart disease without heart failure: Secondary | ICD-10-CM | POA: Diagnosis not present

## 2020-02-08 DIAGNOSIS — E1159 Type 2 diabetes mellitus with other circulatory complications: Secondary | ICD-10-CM | POA: Diagnosis not present

## 2020-02-08 DIAGNOSIS — I480 Paroxysmal atrial fibrillation: Secondary | ICD-10-CM | POA: Diagnosis not present

## 2020-02-08 DIAGNOSIS — F419 Anxiety disorder, unspecified: Secondary | ICD-10-CM | POA: Diagnosis not present

## 2020-02-08 DIAGNOSIS — I252 Old myocardial infarction: Secondary | ICD-10-CM | POA: Diagnosis not present

## 2020-02-08 DIAGNOSIS — F32A Depression, unspecified: Secondary | ICD-10-CM | POA: Diagnosis not present

## 2020-02-09 NOTE — Telephone Encounter (Signed)
OV RS by schedulers to 02/21/2020 at 11:10 AM

## 2020-02-10 ENCOUNTER — Other Ambulatory Visit: Payer: Self-pay | Admitting: Medical-Surgical

## 2020-02-21 ENCOUNTER — Ambulatory Visit: Payer: Medicare HMO | Admitting: Medical-Surgical

## 2020-02-28 ENCOUNTER — Ambulatory Visit: Payer: Medicare HMO | Admitting: Medical-Surgical

## 2020-03-06 ENCOUNTER — Other Ambulatory Visit: Payer: Self-pay

## 2020-03-06 ENCOUNTER — Ambulatory Visit (INDEPENDENT_AMBULATORY_CARE_PROVIDER_SITE_OTHER): Payer: Medicare HMO | Admitting: Medical-Surgical

## 2020-03-06 ENCOUNTER — Encounter: Payer: Self-pay | Admitting: Medical-Surgical

## 2020-03-06 VITALS — BP 123/75 | HR 63 | Temp 97.8°F | Ht 59.0 in | Wt 128.8 lb

## 2020-03-06 DIAGNOSIS — E1159 Type 2 diabetes mellitus with other circulatory complications: Secondary | ICD-10-CM | POA: Diagnosis not present

## 2020-03-06 LAB — POCT GLYCOSYLATED HEMOGLOBIN (HGB A1C): Hemoglobin A1C: 8.4 % — AB (ref 4.0–5.6)

## 2020-03-06 MED ORDER — SEMAGLUTIDE 7 MG PO TABS: 1.0000 | ORAL_TABLET | Freq: Every day | ORAL | 0 refills | Status: DC

## 2020-03-06 NOTE — Progress Notes (Signed)
Subjective:    CC: DM follow up  HPI: Pleasant 70 year old female accompanied by her son presenting today for follow up on diabetes. Has not been checking her sugars since she says she cannot get blood from her fingers with the lancets she is using. Her son was unaware of this. She has been eating convenience foods, frozen meals, canned pastas, and other foods that she admits she should not eat. Taking Jardiance 25mg  daily, tolerating well without side effects. Is not exercising.   I reviewed the past medical history, family history, social history, surgical history, and allergies today and no changes were needed.  Please see the problem list section below in epic for further details.  Past Medical History: Past Medical History:  Diagnosis Date  .  Paroxysmal SVT (ANVRT)    S/p AV nodal ablation Dr. Lovena Le 09/28/08   . Anxiety   . CAD    Coronary disease status post non-ST elevation MI in May 2010 with  PCI of the left circumflex on Jun 24, 2008. She then had a PCI of  the RCA on July 15, 2008. 02/24/12 Cath, Severe 95% LAD, ostial 95% circ, ostial RCA, patent stents in distal RCA and mid circ normal LV 02/26/12 CABG with LIMA to LAD, SVG to RCA, and SVG to OM Dr. Roxan Hockey    . Cancer [facility])    adenocarcinoma of rectum  . Cataract   . Depression   . Diabetes mellitus, insulin dependent (IDDM), uncontrolled   . Heart murmur   . Hyperlipidemia   . Hypertension   . Hypertensive heart disease   . S/P radiation therapy 04/11/14-05/18/14   50.4Gy rectal  . Vertigo    Result of chemotherapy; has been to ENT and told nothing to be done   Past Surgical History: Past Surgical History:  Procedure Laterality Date  . brain tumor removed     2019  . COLON SURGERY     2016  . COLONOSCOPY    . COLONOSCOPY WITH PROPOFOL N/A 07/12/2014   Procedure: COLONOSCOPY WITH PROPOFOL POSSIBLE POLYP RESECTION;  Surgeon: [name], MD;  [facility];  Service: Endoscopy;  Laterality: N/A;   to be  admitted after colonoscopy-robotic surgery on 6/8  . CORONARY ANGIOPLASTY WITH STENT PLACEMENT    . CORONARY ARTERY BYPASS GRAFT  02/26/2012   Roxan Hockey, MD;  Location: [place];  Service: Open Heart Surgery;  Laterality: N/A;  CABG x three,  using left internal mammary artery  and left leg greater saphenous vein,   . ENDOVEIN HARVEST OF GREATER SAPHENOUS VEIN  02/26/2012  . EUS N/A 03/24/2014   Procedure: LOWER ENDOSCOPIC ULTRASOUND (EUS);  Surgeon: Milus Banister, MD;  Location: [facility];  Service: Endoscopy;  Laterality: N/A;  . LEFT HEART CATHETERIZATION WITH CORONARY ANGIOGRAM N/A 02/24/2012   Procedure: LEFT HEART CATHETERIZATION WITH CORONARY ANGIOGRAM;  Surgeon: Jacolyn Reedy, MD;  Location: [facility] CATH LAB;  Service: Cardiovascular;  Laterality: N/A;   Social History: Social History   Socioeconomic History  . Marital status: Divorced    Spouse name: Not on file  . Number of children: Not on file  . Years of education: Not on file  . Highest education level: Not on file  Occupational History  . Not on file  Tobacco Use  . Smoking status: Never Smoker  . Smokeless tobacco: Never Used  Vaping Use  . Vaping Use: Never used  Substance and Sexual Activity  . Alcohol use: No    Alcohol/week: 0.0 standard  drinks  . Drug use: No  . Sexual activity: Not Currently  Other Topics Concern  . 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   Also has a dog   Enjoys reading, gardening, going to movies or just getting out with her sister   [employer] to church and bible study group on  Tuesdays.   Independent and drives   Says she copes with stress by prayer    Social Determinants of Health   Financial Resource Strain: Not on file  Food Insecurity: Not on file  Transportation Needs: Not on file  Physical  Activity: Not on file  Stress: Not on file  Social Connections: Not on file   Family History: Family History  Problem Relation Age of Onset  . Diabetes Father   . Diabetes Brother   . Alzheimer's disease Brother 30  . Diabetes Sister   . Colon polyps Maternal Grandmother   . Colon cancer Neg Hx   . Esophageal cancer Neg Hx   . Liver cancer Neg Hx   . Pancreatic cancer Neg Hx   . Rectal cancer Neg Hx   . Stomach cancer Neg Hx    Allergies: No Known Allergies Medications: See med rec.  Review of Systems: See HPI for pertinent positives and negatives.   Objective:    General: Well Developed, well nourished, and in no acute distress.  Neuro: Alert and oriented x3.  HEENT: Normocephalic, atraumatic.  Skin: Warm and dry. Cardiac: Regular rate and rhythm, no murmurs rubs or gallops, no lower extremity edema.  Respiratory: Clear to auscultation bilaterally. Not using accessory muscles, speaking in full sentences.   Impression and Recommendations:    1. Type 2 diabetes mellitus with vascular disease (H[facility]) POCT HgbA1c 8.4%. Continue Jardiance 25mg  daily. Start Rybelsus 7mg  daily. Strongly encouraged diabetic diet compliance as well as physical activity of some sort each day. Will look into getting approval for a CGM such as Freestyle or Dexcom but will need to consult with her insurance for coverage.  - POCT glycosylated hemoglobin (Hb A1C)  Return in about 3 months (around 06/03/2020) for DM follow up.  ___________________________________________ Clearnce Sorrel, DNP, APRN, FNP-BC [facility] and [place]

## 2020-03-07 MED ORDER — DEXCOM G6 SENSOR MISC: 3 refills | Status: DC

## 2020-03-07 MED ORDER — DEXCOM G6 TRANSMITTER MISC: 1.0000 | Freq: Once | 0 refills | Status: AC

## 2020-03-07 MED ORDER — DEXCOM G6 RECEIVER DEVI: 1.0000 | Freq: Once | 0 refills | Status: AC

## 2020-03-07 NOTE — Progress Notes (Signed)
Pharmacy verified that any glucometer will be covered 100%. Sending prescription for Dexcom CGM with sensors, transmitter, and receiver.   Clearnce Sorrel, DNP, APRN, FNP-BC [place] [facility]

## 2020-03-07 NOTE — Addendum Note (Signed)
Addended bySamuel Bouche on: 03/07/2020 11:25 AM   Modules accepted: Orders

## 2020-03-08 NOTE — Progress Notes (Signed)
Virtual Visit via Telephone   I connected with  [name]  on 03/08/20 by telephone/telehealth and verified that I am speaking with the correct person using two identifiers.   I discussed the limitations, risks, security and privacy concerns of performing an evaluation and management service by telephone, including the higher likelihood of inaccurate diagnosis and treatment, and the availability of in person appointments.  We also discussed the likely need of an additional face to face encounter for complete and high quality delivery of care.  I also discussed with the patient that there may be a patient responsible charge related to this service. The patient expressed understanding and wishes to proceed.  Provider location is in medical facility. Patient location is at their home, different from provider location. People involved in care of the patient during this telehealth encounter were myself, my nurse/medical assistant, and my front office/scheduling team member.  CC: vaginal itching/burning  HPI: Pleasant 70 year old female presenting via telephone with complaints of vaginal burning and itching. Started after starting Jardiance. Treated with OTC antifungals with resolution but has returned a couple of times. No discharge or urinary symptoms. Not sexually active. Sugars have been high and working on getting them controlled with Jardiance and Rybelsus since unable to tolerate Metformin.   Review of Systems: See HPI for pertinent positives and negatives.   Objective Findings:    General: Speaking full sentences, no audible heavy breathing.  Sounds alert and appropriately interactive.    Impression and Recommendations:    1. Vulvovaginal candidiasis Diflucan 150mg  x 1 with repeat 3 days later. Getting blood sugars under control is going to be the key to prevention. Start Rybelsus when it comes in to see if this will get better control. In the meantime, dietary modifications strongly  encouraged.   I discussed the above assessment and treatment plan with the patient. The patient was provided an opportunity to ask questions and all were answered. The patient agreed with the plan and demonstrated an understanding of the instructions.   The patient was advised to call back or seek an in-person evaluation if the symptoms worsen or if the condition fails to improve as anticipated.  15 minutes of non-face-to-face time was provided during this encounter.  No follow-ups on file. ___________________________________________ Samuel Bouche, DNP, APRN, FNP-BC [facility] [place]

## 2020-03-09 ENCOUNTER — Telehealth (INDEPENDENT_AMBULATORY_CARE_PROVIDER_SITE_OTHER): Payer: Medicare HMO | Admitting: Medical-Surgical

## 2020-03-09 ENCOUNTER — Encounter: Payer: Self-pay | Admitting: Medical-Surgical

## 2020-03-09 DIAGNOSIS — B373 Candidiasis of vulva and vagina: Secondary | ICD-10-CM

## 2020-03-09 DIAGNOSIS — B3731 Acute candidiasis of vulva and vagina: Secondary | ICD-10-CM

## 2020-03-09 MED ORDER — FLUCONAZOLE 150 MG PO TABS: ORAL_TABLET | ORAL | 1 refills | Status: DC

## 2020-04-10 ENCOUNTER — Other Ambulatory Visit: Payer: Self-pay

## 2020-04-10 ENCOUNTER — Other Ambulatory Visit: Payer: Self-pay | Admitting: Medical-Surgical

## 2020-04-10 DIAGNOSIS — Z8659 Personal history of other mental and behavioral disorders: Secondary | ICD-10-CM

## 2020-04-10 DIAGNOSIS — F418 Other specified anxiety disorders: Secondary | ICD-10-CM

## 2020-04-10 DIAGNOSIS — E1159 Type 2 diabetes mellitus with other circulatory complications: Secondary | ICD-10-CM

## 2020-04-10 DIAGNOSIS — G4700 Insomnia, unspecified: Secondary | ICD-10-CM

## 2020-04-10 DIAGNOSIS — I119 Hypertensive heart disease without heart failure: Secondary | ICD-10-CM

## 2020-04-10 DIAGNOSIS — E785 Hyperlipidemia, unspecified: Secondary | ICD-10-CM

## 2020-04-10 DIAGNOSIS — E559 Vitamin D deficiency, unspecified: Secondary | ICD-10-CM

## 2020-04-10 MED ORDER — AMITRIPTYLINE HCL 50 MG PO TABS: 50.0000 mg | ORAL_TABLET | Freq: Every day | ORAL | 0 refills | Status: DC

## 2020-04-10 MED ORDER — SEMAGLUTIDE 7 MG PO TABS: 1.0000 | ORAL_TABLET | Freq: Every day | ORAL | 0 refills | Status: DC

## 2020-04-10 MED ORDER — ARIPIPRAZOLE 10 MG PO TABS: 10.0000 mg | ORAL_TABLET | Freq: Every day | ORAL | 2 refills | Status: DC

## 2020-04-10 MED ORDER — EMPAGLIFLOZIN 25 MG PO TABS: 25.0000 mg | ORAL_TABLET | Freq: Every day | ORAL | 2 refills | Status: DC

## 2020-04-10 MED ORDER — METOPROLOL TARTRATE 25 MG PO TABS: 25.0000 mg | ORAL_TABLET | Freq: Two times a day (BID) | ORAL | 1 refills | Status: DC

## 2020-04-10 MED ORDER — ATORVASTATIN CALCIUM 10 MG PO TABS: 10.0000 mg | ORAL_TABLET | Freq: Every day | ORAL | 2 refills | Status: DC

## 2020-04-10 MED ORDER — VITAMIN D (ERGOCALCIFEROL) 1.25 MG (50000 UNIT) PO CAPS: 50000.0000 [IU] | ORAL_CAPSULE | ORAL | 2 refills | Status: AC

## 2020-05-15 ENCOUNTER — Other Ambulatory Visit: Payer: Self-pay | Admitting: Medical-Surgical

## 2020-05-15 DIAGNOSIS — E1159 Type 2 diabetes mellitus with other circulatory complications: Secondary | ICD-10-CM

## 2020-05-16 ENCOUNTER — Other Ambulatory Visit: Payer: Self-pay | Admitting: Medical-Surgical

## 2020-05-16 DIAGNOSIS — E1159 Type 2 diabetes mellitus with other circulatory complications: Secondary | ICD-10-CM

## 2020-06-05 ENCOUNTER — Other Ambulatory Visit: Payer: Self-pay

## 2020-06-05 ENCOUNTER — Ambulatory Visit (INDEPENDENT_AMBULATORY_CARE_PROVIDER_SITE_OTHER): Payer: Medicare HMO

## 2020-06-05 ENCOUNTER — Ambulatory Visit (INDEPENDENT_AMBULATORY_CARE_PROVIDER_SITE_OTHER): Payer: Medicare HMO | Admitting: Medical-Surgical

## 2020-06-05 ENCOUNTER — Encounter: Payer: Self-pay | Admitting: Medical-Surgical

## 2020-06-05 VITALS — BP 105/67 | HR 82 | Temp 97.8°F | Ht 59.0 in | Wt 129.4 lb

## 2020-06-05 DIAGNOSIS — E785 Hyperlipidemia, unspecified: Secondary | ICD-10-CM

## 2020-06-05 DIAGNOSIS — I119 Hypertensive heart disease without heart failure: Secondary | ICD-10-CM | POA: Diagnosis not present

## 2020-06-05 DIAGNOSIS — E1159 Type 2 diabetes mellitus with other circulatory complications: Secondary | ICD-10-CM

## 2020-06-05 DIAGNOSIS — M47818 Spondylosis without myelopathy or radiculopathy, sacral and sacrococcygeal region: Secondary | ICD-10-CM | POA: Diagnosis not present

## 2020-06-05 DIAGNOSIS — M25552 Pain in left hip: Secondary | ICD-10-CM

## 2020-06-05 DIAGNOSIS — G4700 Insomnia, unspecified: Secondary | ICD-10-CM

## 2020-06-05 DIAGNOSIS — F418 Other specified anxiety disorders: Secondary | ICD-10-CM | POA: Diagnosis not present

## 2020-06-05 LAB — POCT GLYCOSYLATED HEMOGLOBIN (HGB A1C): Hemoglobin A1C: 8.8 % — AB (ref 4.0–5.6)

## 2020-06-05 MED ORDER — TRAMADOL HCL 50 MG PO TABS: ORAL_TABLET | ORAL | 0 refills | Status: DC

## 2020-06-05 MED ORDER — ARIPIPRAZOLE 10 MG PO TABS: 10.0000 mg | ORAL_TABLET | Freq: Every day | ORAL | 2 refills | Status: DC

## 2020-06-05 MED ORDER — AMITRIPTYLINE HCL 50 MG PO TABS
50.0000 mg | ORAL_TABLET | Freq: Every day | ORAL | 0 refills | Status: DC
Start: 2020-06-05 — End: 2021-01-08

## 2020-06-05 MED ORDER — EMPAGLIFLOZIN 25 MG PO TABS: 25.0000 mg | ORAL_TABLET | Freq: Every day | ORAL | 2 refills | Status: DC

## 2020-06-05 MED ORDER — ATORVASTATIN CALCIUM 10 MG PO TABS: 10.0000 mg | ORAL_TABLET | Freq: Every day | ORAL | 2 refills | Status: DC

## 2020-06-05 MED ORDER — RYBELSUS 14 MG PO TABS: 14.0000 mg | ORAL_TABLET | Freq: Every day | ORAL | 2 refills | Status: DC

## 2020-06-05 MED ORDER — METOPROLOL TARTRATE 25 MG PO TABS: 25.0000 mg | ORAL_TABLET | Freq: Two times a day (BID) | ORAL | 1 refills | Status: DC

## 2020-06-05 NOTE — Progress Notes (Signed)
Subjective:    CC: Chronic disease follow-up  HPI: Pleasant 70 year old female accompanied by her son presenting for the following:  Diabetes-not checking sugars at home as she reports she cannot get blood using the lancets that she has.  Her son was not aware of this.  Admits that she is eating like crazy and not paying attention to the recommended diabetic diet.  She likes to eat canned foods and high carbohydrate snacks.  Notes that she does this because she does not want to cook.  Taking Jardiance 25 mg daily and Rybelsus 7 mg daily, tolerating well without side effects.  Insomnia-notes that she is sleeping very well with use of amitriptyline at night.  Mood-taking amitriptyline 50 mg nightly, tolerating well without side effects.  Also taking Abilify 10 mg daily as prescribed.  Feels that her anxiety and depression symptoms are well controlled.  Left hip pain-notes that she has been having significant left hip pain for the last week.  This kept her bedridden for several days as walking on it is an exacerbating factor.  Feels okay when sitting/lying.  She has been taking Aleve to help with this and using a topical cream for pain.  This is somewhat helpful but does still limit her activity.  I reviewed the past medical history, family history, social history, surgical history, and allergies today and no changes were needed.  Please see the problem list section below in epic for further details.  Past Medical History: Past Medical History:  Diagnosis Date  .  Paroxysmal SVT (ANVRT)    S/p AV nodal ablation Dr. Lovena Le 09/28/08   . Anxiety   . CAD    Coronary disease status post non-ST elevation MI in May 2010 with  PCI of the left circumflex on Jun 24, 2008. She then had a PCI of  the RCA on July 15, 2008. 02/24/12 Cath, Severe 95% LAD, ostial 95% circ, ostial RCA, patent stents in distal RCA and mid circ normal LV 02/26/12 CABG with LIMA to LAD, SVG to RCA, and SVG to OM Dr. Roxan Hockey     . Cancer [facility])    adenocarcinoma of rectum  . Cataract   . Depression   . Diabetes mellitus, insulin dependent (IDDM), uncontrolled   . Heart murmur   . Hyperlipidemia   . Hypertension   . Hypertensive heart disease   . S/P radiation therapy 04/11/14-05/18/14   50.4Gy rectal  . Vertigo    Result of chemotherapy; has been to ENT and told nothing to be done   Past Surgical History: Past Surgical History:  Procedure Laterality Date  . brain tumor removed     2019  . COLON SURGERY     2016  . COLONOSCOPY    . COLONOSCOPY WITH PROPOFOL N/A 07/12/2014   Procedure: COLONOSCOPY WITH PROPOFOL POSSIBLE POLYP RESECTION;  Surgeon: [name], MD;  Location: [facility];  Service: Endoscopy;  Laterality: N/A;   to be admitted after colonoscopy-robotic surgery on 6/8  . CORONARY ANGIOPLASTY WITH STENT PLACEMENT    . CORONARY ARTERY BYPASS GRAFT  02/26/2012   Roxan Hockey, MD;  Location: [place];  Service: Open Heart Surgery;  Laterality: N/A;  CABG x three,  using left internal mammary artery  and left leg greater saphenous vein,   . ENDOVEIN HARVEST OF GREATER SAPHENOUS VEIN  02/26/2012  . EUS N/A 03/24/2014   Procedure: LOWER ENDOSCOPIC ULTRASOUND (EUS);  Surgeon: Milus Banister, MD;  Location: [facility];  Service: Endoscopy;  Laterality: N/A;  .  LEFT HEART CATHETERIZATION WITH CORONARY ANGIOGRAM N/A 02/24/2012   Procedure: LEFT HEART CATHETERIZATION WITH CORONARY ANGIOGRAM;  Surgeon: Jacolyn Reedy, MD;  Location: [facility] CATH LAB;  Service: Cardiovascular;  Laterality: N/A;   Social History: Social History   Socioeconomic History  . Marital status: Divorced    Spouse name: Not on file  . Number of children: Not on file  . Years of education: Not on file  . Highest education level: Not on file  Occupational History  . Not on file  Tobacco Use  . Smoking status: Never Smoker  . Smokeless tobacco: Never Used  Vaping Use  . Vaping Use: Never used  Substance and Sexual Activity  .  Alcohol use: No    Alcohol/week: 0.0 standard drinks  . Drug use: No  . Sexual activity: Not Currently  Other Topics Concern  . 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   Also has a dog   Enjoys reading, gardening, going to movies or just getting out with her sister   [employer] to church and bible study group on  Tuesdays.   Independent and drives   Says she copes with stress by prayer    Social Determinants of Health   Financial Resource Strain: Not on file  Food Insecurity: Not on file  Transportation Needs: Not on file  Physical Activity: Not on file  Stress: Not on file  Social Connections: Not on file   Family History: Family History  Problem Relation Age of Onset  . Diabetes Father   . Diabetes Brother   . Alzheimer's disease Brother 18  . Diabetes Sister   . Colon polyps Maternal Grandmother   . Colon cancer Neg Hx   . Esophageal cancer Neg Hx   . Liver cancer Neg Hx   . Pancreatic cancer Neg Hx   . Rectal cancer Neg Hx   . Stomach cancer Neg Hx    Allergies: No Known Allergies Medications: See med rec.  Review of Systems: See HPI for pertinent positives and negatives.   Objective:    General: Well Developed, well nourished, and in no acute distress.  Neuro: Alert and oriented x3.  HEENT: Normocephalic, atraumatic.  Skin: Warm and dry. Cardiac: Regular rate and rhythm, no murmurs rubs or gallops, no lower extremity edema.  Respiratory: Clear to auscultation bilaterally. Not using accessory muscles, speaking in full sentences.   Impression and Recommendations:    1. Type 2 diabetes mellitus with vascular disease (HCC) POCT hemoglobin A1c 8.8% today.  Continue Jardiance 25 mg daily and increase Rybelsus to 14 mg daily.  Discussed dietary modifications and the importance of  controlling diabetes.  Unfortunately, metformin is not tolerated but was very successful in controlling her glucose.  Since this is not an option, she will have to make changes to her diet.  Her son notes that he will look into clean eats since she does not want to cook anymore and they have specific diets available for meal delivery. - POCT glycosylated hemoglobin (Hb A1C) - empagliflozin (JARDIANCE) 25 MG TABS tablet; Take 1 tablet (25 mg total) by mouth daily before breakfast.  Dispense: 90 tablet; Refill: 2  2. Insomnia, unspecified type 3. Anxiety with depression  Well-controlled.  Continue amitriptyline 50 mg nightly and  Abilify 10 mg daily. - amitriptyline (ELAVIL) 50 MG tablet; Take 1 tablet (50 mg total) by mouth at bedtime.  Dispense: 90 tablet; Refill: 0 - ARIPiprazole (ABILIFY) 10 MG tablet; Take 1 tablet (10 mg total) by mouth daily.  Dispense: 90 tablet; Refill: 2  4. Hyperlipidemia, unspecified hyperlipidemia type Continue atorvastatin 10 mg daily. - atorvastatin (LIPITOR) 10 MG tablet; Take 1 tablet (10 mg total) by mouth daily.  Dispense: 90 tablet; Refill: 2  5. Hypertensive heart disease without heart failure Continue metoprolol 25 mg twice daily. - metoprolol tartrate (LOPRESSOR) 25 MG tablet; Take 1 tablet (25 mg total) by mouth 2 (two) times daily. TAKE 1 TABLET(25 MG) BY MOUTH TWICE DAILY  Dispense: 180 tablet; Refill: 1  6. SI joint arthritis 7. Left hip pain Known SI joint arthritis but new onset left hip pain.  Getting updated left hip x-rays today.  Okay to use Aleve sparingly but monitor for signs of GI upset, melena, or hematochezia.  Tramadol 50 mg every 8 hours as needed with caution to use very sparingly and only for severe discomfort. - traMADol (ULTRAM) 50 MG tablet; TAKE 1 TABLET(50 MG) BY MOUTH EVERY 8 HOURS AS NEEDED  Dispense: 15 tablet; Refill: 0 - DG Hip Unilat W OR W/O Pelvis 2-3 Views Left; Future  Return in about 3 months (around 09/05/2020) for DM  follow up. ___________________________________________ Clearnce Sorrel, DNP, APRN, FNP-BC [facility] [place]

## 2020-06-06 ENCOUNTER — Other Ambulatory Visit: Payer: Self-pay | Admitting: Medical-Surgical

## 2020-06-06 DIAGNOSIS — R54 Age-related physical debility: Secondary | ICD-10-CM

## 2020-06-06 DIAGNOSIS — W19XXXA Unspecified fall, initial encounter: Secondary | ICD-10-CM

## 2020-06-06 DIAGNOSIS — R413 Other amnesia: Secondary | ICD-10-CM

## 2020-06-06 DIAGNOSIS — M160 Bilateral primary osteoarthritis of hip: Secondary | ICD-10-CM

## 2020-06-09 ENCOUNTER — Telehealth: Payer: Self-pay

## 2020-06-09 DIAGNOSIS — E785 Hyperlipidemia, unspecified: Secondary | ICD-10-CM | POA: Diagnosis not present

## 2020-06-09 DIAGNOSIS — I999 Unspecified disorder of circulatory system: Secondary | ICD-10-CM | POA: Diagnosis not present

## 2020-06-09 DIAGNOSIS — F418 Other specified anxiety disorders: Secondary | ICD-10-CM | POA: Diagnosis not present

## 2020-06-09 DIAGNOSIS — G4700 Insomnia, unspecified: Secondary | ICD-10-CM | POA: Diagnosis not present

## 2020-06-09 DIAGNOSIS — M4306 Spondylolysis, lumbar region: Secondary | ICD-10-CM | POA: Diagnosis not present

## 2020-06-09 DIAGNOSIS — I25810 Atherosclerosis of coronary artery bypass graft(s) without angina pectoris: Secondary | ICD-10-CM | POA: Diagnosis not present

## 2020-06-09 DIAGNOSIS — E1159 Type 2 diabetes mellitus with other circulatory complications: Secondary | ICD-10-CM | POA: Diagnosis not present

## 2020-06-09 DIAGNOSIS — M47818 Spondylosis without myelopathy or radiculopathy, sacral and sacrococcygeal region: Secondary | ICD-10-CM | POA: Diagnosis not present

## 2020-06-09 DIAGNOSIS — I119 Hypertensive heart disease without heart failure: Secondary | ICD-10-CM | POA: Diagnosis not present

## 2020-06-09 NOTE — Telephone Encounter (Signed)
John aware of authorization of orders and instructed to send the paperwork to the attention of Dr. Emeterio Reeve. He verified the name of the provider he needed to send the paperwork to. No further questions or concerns at this time.

## 2020-06-09 NOTE — Telephone Encounter (Signed)
John with [facility] called stating that the Rx for amitriptyline 50 mg is considered high risk for pts her age and is requesting a Rx change to trazodone or melatonin depending on the level of insomnia. Please advise.

## 2020-06-09 NOTE — Telephone Encounter (Signed)
John with [facility] called requesting verbal orders to continue home PT plan of care for gait balance, transfer strength, pain management, activity tolerance, and safety:   1 week for 1 2 week for 2 1 week for 6  He is also requesting orders for an add on skilled nursing evaluation secondary to pt having trouble monitoring and managing DM.   John with [employer] [phone]

## 2020-06-09 NOTE — Telephone Encounter (Signed)
She has already trialed trazodone which was not helpful.  Her anxiety was not well managed and she was not sleeping despite being on Lexapro, sertraline, and hydroxyzine 75 mg 3 times daily.  After review of her medications and evaluation of her risk, we started amitriptyline which is helping with sleep as well as mood.  She has been on this medication for nearly 6 months and is tolerating it well so want to avoid changing it.

## 2020-06-09 NOTE — Telephone Encounter (Signed)
Okay to give verbal order for this request.  They will need to address documentation for signing to Dr. Emeterio Reeve as I cannot sign home health orders in [facility] as an NP.

## 2020-06-09 NOTE — Telephone Encounter (Signed)
[facility] aware of [name]'s response. No further questions or concerns at this time.

## 2020-06-14 DIAGNOSIS — I119 Hypertensive heart disease without heart failure: Secondary | ICD-10-CM | POA: Diagnosis not present

## 2020-06-14 DIAGNOSIS — M4306 Spondylolysis, lumbar region: Secondary | ICD-10-CM | POA: Diagnosis not present

## 2020-06-14 DIAGNOSIS — G4700 Insomnia, unspecified: Secondary | ICD-10-CM | POA: Diagnosis not present

## 2020-06-14 DIAGNOSIS — E785 Hyperlipidemia, unspecified: Secondary | ICD-10-CM | POA: Diagnosis not present

## 2020-06-14 DIAGNOSIS — I999 Unspecified disorder of circulatory system: Secondary | ICD-10-CM | POA: Diagnosis not present

## 2020-06-14 DIAGNOSIS — M47818 Spondylosis without myelopathy or radiculopathy, sacral and sacrococcygeal region: Secondary | ICD-10-CM | POA: Diagnosis not present

## 2020-06-14 DIAGNOSIS — E1159 Type 2 diabetes mellitus with other circulatory complications: Secondary | ICD-10-CM | POA: Diagnosis not present

## 2020-06-14 DIAGNOSIS — I25810 Atherosclerosis of coronary artery bypass graft(s) without angina pectoris: Secondary | ICD-10-CM | POA: Diagnosis not present

## 2020-06-14 DIAGNOSIS — F418 Other specified anxiety disorders: Secondary | ICD-10-CM | POA: Diagnosis not present

## 2020-06-16 ENCOUNTER — Other Ambulatory Visit: Payer: Self-pay | Admitting: Family Medicine

## 2020-06-16 DIAGNOSIS — G4700 Insomnia, unspecified: Secondary | ICD-10-CM | POA: Diagnosis not present

## 2020-06-16 DIAGNOSIS — M4306 Spondylolysis, lumbar region: Secondary | ICD-10-CM | POA: Diagnosis not present

## 2020-06-16 DIAGNOSIS — I999 Unspecified disorder of circulatory system: Secondary | ICD-10-CM | POA: Diagnosis not present

## 2020-06-16 DIAGNOSIS — F418 Other specified anxiety disorders: Secondary | ICD-10-CM | POA: Diagnosis not present

## 2020-06-16 DIAGNOSIS — M47818 Spondylosis without myelopathy or radiculopathy, sacral and sacrococcygeal region: Secondary | ICD-10-CM | POA: Diagnosis not present

## 2020-06-16 DIAGNOSIS — I25810 Atherosclerosis of coronary artery bypass graft(s) without angina pectoris: Secondary | ICD-10-CM | POA: Diagnosis not present

## 2020-06-16 DIAGNOSIS — I119 Hypertensive heart disease without heart failure: Secondary | ICD-10-CM | POA: Diagnosis not present

## 2020-06-16 DIAGNOSIS — E1159 Type 2 diabetes mellitus with other circulatory complications: Secondary | ICD-10-CM | POA: Diagnosis not present

## 2020-06-16 DIAGNOSIS — E785 Hyperlipidemia, unspecified: Secondary | ICD-10-CM | POA: Diagnosis not present

## 2020-06-21 DIAGNOSIS — G4700 Insomnia, unspecified: Secondary | ICD-10-CM | POA: Diagnosis not present

## 2020-06-21 DIAGNOSIS — M4306 Spondylolysis, lumbar region: Secondary | ICD-10-CM | POA: Diagnosis not present

## 2020-06-21 DIAGNOSIS — F418 Other specified anxiety disorders: Secondary | ICD-10-CM | POA: Diagnosis not present

## 2020-06-21 DIAGNOSIS — M47818 Spondylosis without myelopathy or radiculopathy, sacral and sacrococcygeal region: Secondary | ICD-10-CM | POA: Diagnosis not present

## 2020-06-21 DIAGNOSIS — I999 Unspecified disorder of circulatory system: Secondary | ICD-10-CM | POA: Diagnosis not present

## 2020-06-21 DIAGNOSIS — I25810 Atherosclerosis of coronary artery bypass graft(s) without angina pectoris: Secondary | ICD-10-CM | POA: Diagnosis not present

## 2020-06-21 DIAGNOSIS — I119 Hypertensive heart disease without heart failure: Secondary | ICD-10-CM | POA: Diagnosis not present

## 2020-06-21 DIAGNOSIS — E1159 Type 2 diabetes mellitus with other circulatory complications: Secondary | ICD-10-CM | POA: Diagnosis not present

## 2020-06-21 DIAGNOSIS — E785 Hyperlipidemia, unspecified: Secondary | ICD-10-CM | POA: Diagnosis not present

## 2020-06-23 DIAGNOSIS — M4306 Spondylolysis, lumbar region: Secondary | ICD-10-CM | POA: Diagnosis not present

## 2020-06-23 DIAGNOSIS — E785 Hyperlipidemia, unspecified: Secondary | ICD-10-CM | POA: Diagnosis not present

## 2020-06-23 DIAGNOSIS — M47818 Spondylosis without myelopathy or radiculopathy, sacral and sacrococcygeal region: Secondary | ICD-10-CM | POA: Diagnosis not present

## 2020-06-23 DIAGNOSIS — I999 Unspecified disorder of circulatory system: Secondary | ICD-10-CM | POA: Diagnosis not present

## 2020-06-23 DIAGNOSIS — F418 Other specified anxiety disorders: Secondary | ICD-10-CM | POA: Diagnosis not present

## 2020-06-23 DIAGNOSIS — I25810 Atherosclerosis of coronary artery bypass graft(s) without angina pectoris: Secondary | ICD-10-CM | POA: Diagnosis not present

## 2020-06-23 DIAGNOSIS — G4700 Insomnia, unspecified: Secondary | ICD-10-CM | POA: Diagnosis not present

## 2020-06-23 DIAGNOSIS — I119 Hypertensive heart disease without heart failure: Secondary | ICD-10-CM | POA: Diagnosis not present

## 2020-06-23 DIAGNOSIS — E1159 Type 2 diabetes mellitus with other circulatory complications: Secondary | ICD-10-CM | POA: Diagnosis not present

## 2020-06-28 DIAGNOSIS — I999 Unspecified disorder of circulatory system: Secondary | ICD-10-CM | POA: Diagnosis not present

## 2020-06-28 DIAGNOSIS — G4700 Insomnia, unspecified: Secondary | ICD-10-CM | POA: Diagnosis not present

## 2020-06-28 DIAGNOSIS — M47818 Spondylosis without myelopathy or radiculopathy, sacral and sacrococcygeal region: Secondary | ICD-10-CM | POA: Diagnosis not present

## 2020-06-28 DIAGNOSIS — I25810 Atherosclerosis of coronary artery bypass graft(s) without angina pectoris: Secondary | ICD-10-CM | POA: Diagnosis not present

## 2020-06-28 DIAGNOSIS — F418 Other specified anxiety disorders: Secondary | ICD-10-CM | POA: Diagnosis not present

## 2020-06-28 DIAGNOSIS — I119 Hypertensive heart disease without heart failure: Secondary | ICD-10-CM | POA: Diagnosis not present

## 2020-06-28 DIAGNOSIS — M4306 Spondylolysis, lumbar region: Secondary | ICD-10-CM | POA: Diagnosis not present

## 2020-06-28 DIAGNOSIS — E785 Hyperlipidemia, unspecified: Secondary | ICD-10-CM | POA: Diagnosis not present

## 2020-06-28 DIAGNOSIS — E1159 Type 2 diabetes mellitus with other circulatory complications: Secondary | ICD-10-CM | POA: Diagnosis not present

## 2020-06-29 DIAGNOSIS — I25810 Atherosclerosis of coronary artery bypass graft(s) without angina pectoris: Secondary | ICD-10-CM | POA: Diagnosis not present

## 2020-06-29 DIAGNOSIS — G4700 Insomnia, unspecified: Secondary | ICD-10-CM | POA: Diagnosis not present

## 2020-06-29 DIAGNOSIS — F418 Other specified anxiety disorders: Secondary | ICD-10-CM | POA: Diagnosis not present

## 2020-06-29 DIAGNOSIS — I119 Hypertensive heart disease without heart failure: Secondary | ICD-10-CM | POA: Diagnosis not present

## 2020-06-29 DIAGNOSIS — M47818 Spondylosis without myelopathy or radiculopathy, sacral and sacrococcygeal region: Secondary | ICD-10-CM | POA: Diagnosis not present

## 2020-06-29 DIAGNOSIS — I999 Unspecified disorder of circulatory system: Secondary | ICD-10-CM | POA: Diagnosis not present

## 2020-06-29 DIAGNOSIS — E785 Hyperlipidemia, unspecified: Secondary | ICD-10-CM | POA: Diagnosis not present

## 2020-06-29 DIAGNOSIS — E1159 Type 2 diabetes mellitus with other circulatory complications: Secondary | ICD-10-CM | POA: Diagnosis not present

## 2020-06-29 DIAGNOSIS — M4306 Spondylolysis, lumbar region: Secondary | ICD-10-CM | POA: Diagnosis not present

## 2020-07-05 DIAGNOSIS — I25810 Atherosclerosis of coronary artery bypass graft(s) without angina pectoris: Secondary | ICD-10-CM | POA: Diagnosis not present

## 2020-07-05 DIAGNOSIS — M47818 Spondylosis without myelopathy or radiculopathy, sacral and sacrococcygeal region: Secondary | ICD-10-CM | POA: Diagnosis not present

## 2020-07-05 DIAGNOSIS — I119 Hypertensive heart disease without heart failure: Secondary | ICD-10-CM | POA: Diagnosis not present

## 2020-07-05 DIAGNOSIS — G4700 Insomnia, unspecified: Secondary | ICD-10-CM | POA: Diagnosis not present

## 2020-07-05 DIAGNOSIS — F418 Other specified anxiety disorders: Secondary | ICD-10-CM | POA: Diagnosis not present

## 2020-07-05 DIAGNOSIS — E785 Hyperlipidemia, unspecified: Secondary | ICD-10-CM | POA: Diagnosis not present

## 2020-07-05 DIAGNOSIS — I999 Unspecified disorder of circulatory system: Secondary | ICD-10-CM | POA: Diagnosis not present

## 2020-07-05 DIAGNOSIS — M4306 Spondylolysis, lumbar region: Secondary | ICD-10-CM | POA: Diagnosis not present

## 2020-07-05 DIAGNOSIS — E1159 Type 2 diabetes mellitus with other circulatory complications: Secondary | ICD-10-CM | POA: Diagnosis not present

## 2020-07-06 DIAGNOSIS — E1159 Type 2 diabetes mellitus with other circulatory complications: Secondary | ICD-10-CM | POA: Diagnosis not present

## 2020-07-06 DIAGNOSIS — M4306 Spondylolysis, lumbar region: Secondary | ICD-10-CM | POA: Diagnosis not present

## 2020-07-06 DIAGNOSIS — I119 Hypertensive heart disease without heart failure: Secondary | ICD-10-CM | POA: Diagnosis not present

## 2020-07-06 DIAGNOSIS — I25810 Atherosclerosis of coronary artery bypass graft(s) without angina pectoris: Secondary | ICD-10-CM | POA: Diagnosis not present

## 2020-07-06 DIAGNOSIS — E785 Hyperlipidemia, unspecified: Secondary | ICD-10-CM | POA: Diagnosis not present

## 2020-07-06 DIAGNOSIS — I999 Unspecified disorder of circulatory system: Secondary | ICD-10-CM | POA: Diagnosis not present

## 2020-07-06 DIAGNOSIS — G4700 Insomnia, unspecified: Secondary | ICD-10-CM | POA: Diagnosis not present

## 2020-07-06 DIAGNOSIS — M47818 Spondylosis without myelopathy or radiculopathy, sacral and sacrococcygeal region: Secondary | ICD-10-CM | POA: Diagnosis not present

## 2020-07-06 DIAGNOSIS — F418 Other specified anxiety disorders: Secondary | ICD-10-CM | POA: Diagnosis not present

## 2020-07-09 DIAGNOSIS — E785 Hyperlipidemia, unspecified: Secondary | ICD-10-CM | POA: Diagnosis not present

## 2020-07-09 DIAGNOSIS — M47818 Spondylosis without myelopathy or radiculopathy, sacral and sacrococcygeal region: Secondary | ICD-10-CM | POA: Diagnosis not present

## 2020-07-09 DIAGNOSIS — I25810 Atherosclerosis of coronary artery bypass graft(s) without angina pectoris: Secondary | ICD-10-CM | POA: Diagnosis not present

## 2020-07-09 DIAGNOSIS — I119 Hypertensive heart disease without heart failure: Secondary | ICD-10-CM | POA: Diagnosis not present

## 2020-07-09 DIAGNOSIS — G4700 Insomnia, unspecified: Secondary | ICD-10-CM | POA: Diagnosis not present

## 2020-07-09 DIAGNOSIS — F418 Other specified anxiety disorders: Secondary | ICD-10-CM | POA: Diagnosis not present

## 2020-07-09 DIAGNOSIS — I999 Unspecified disorder of circulatory system: Secondary | ICD-10-CM | POA: Diagnosis not present

## 2020-07-09 DIAGNOSIS — E1159 Type 2 diabetes mellitus with other circulatory complications: Secondary | ICD-10-CM | POA: Diagnosis not present

## 2020-07-09 DIAGNOSIS — M4306 Spondylolysis, lumbar region: Secondary | ICD-10-CM | POA: Diagnosis not present

## 2020-07-10 DIAGNOSIS — I25810 Atherosclerosis of coronary artery bypass graft(s) without angina pectoris: Secondary | ICD-10-CM | POA: Diagnosis not present

## 2020-07-10 DIAGNOSIS — I119 Hypertensive heart disease without heart failure: Secondary | ICD-10-CM | POA: Diagnosis not present

## 2020-07-10 DIAGNOSIS — M4306 Spondylolysis, lumbar region: Secondary | ICD-10-CM | POA: Diagnosis not present

## 2020-07-10 DIAGNOSIS — M47818 Spondylosis without myelopathy or radiculopathy, sacral and sacrococcygeal region: Secondary | ICD-10-CM | POA: Diagnosis not present

## 2020-07-10 DIAGNOSIS — E785 Hyperlipidemia, unspecified: Secondary | ICD-10-CM | POA: Diagnosis not present

## 2020-07-10 DIAGNOSIS — F418 Other specified anxiety disorders: Secondary | ICD-10-CM | POA: Diagnosis not present

## 2020-07-10 DIAGNOSIS — I999 Unspecified disorder of circulatory system: Secondary | ICD-10-CM | POA: Diagnosis not present

## 2020-07-10 DIAGNOSIS — E1159 Type 2 diabetes mellitus with other circulatory complications: Secondary | ICD-10-CM | POA: Diagnosis not present

## 2020-07-10 DIAGNOSIS — G4700 Insomnia, unspecified: Secondary | ICD-10-CM | POA: Diagnosis not present

## 2020-07-14 DIAGNOSIS — I999 Unspecified disorder of circulatory system: Secondary | ICD-10-CM | POA: Diagnosis not present

## 2020-07-14 DIAGNOSIS — M4306 Spondylolysis, lumbar region: Secondary | ICD-10-CM | POA: Diagnosis not present

## 2020-07-14 DIAGNOSIS — I119 Hypertensive heart disease without heart failure: Secondary | ICD-10-CM | POA: Diagnosis not present

## 2020-07-14 DIAGNOSIS — M47818 Spondylosis without myelopathy or radiculopathy, sacral and sacrococcygeal region: Secondary | ICD-10-CM | POA: Diagnosis not present

## 2020-07-14 DIAGNOSIS — I25810 Atherosclerosis of coronary artery bypass graft(s) without angina pectoris: Secondary | ICD-10-CM | POA: Diagnosis not present

## 2020-07-14 DIAGNOSIS — G4700 Insomnia, unspecified: Secondary | ICD-10-CM | POA: Diagnosis not present

## 2020-07-14 DIAGNOSIS — E785 Hyperlipidemia, unspecified: Secondary | ICD-10-CM | POA: Diagnosis not present

## 2020-07-14 DIAGNOSIS — F418 Other specified anxiety disorders: Secondary | ICD-10-CM | POA: Diagnosis not present

## 2020-07-14 DIAGNOSIS — E1159 Type 2 diabetes mellitus with other circulatory complications: Secondary | ICD-10-CM | POA: Diagnosis not present

## 2020-07-20 DIAGNOSIS — F418 Other specified anxiety disorders: Secondary | ICD-10-CM | POA: Diagnosis not present

## 2020-07-20 DIAGNOSIS — G4700 Insomnia, unspecified: Secondary | ICD-10-CM | POA: Diagnosis not present

## 2020-07-20 DIAGNOSIS — M4306 Spondylolysis, lumbar region: Secondary | ICD-10-CM | POA: Diagnosis not present

## 2020-07-20 DIAGNOSIS — E1159 Type 2 diabetes mellitus with other circulatory complications: Secondary | ICD-10-CM | POA: Diagnosis not present

## 2020-07-20 DIAGNOSIS — I119 Hypertensive heart disease without heart failure: Secondary | ICD-10-CM | POA: Diagnosis not present

## 2020-07-20 DIAGNOSIS — M47818 Spondylosis without myelopathy or radiculopathy, sacral and sacrococcygeal region: Secondary | ICD-10-CM | POA: Diagnosis not present

## 2020-07-20 DIAGNOSIS — I999 Unspecified disorder of circulatory system: Secondary | ICD-10-CM | POA: Diagnosis not present

## 2020-07-20 DIAGNOSIS — I25810 Atherosclerosis of coronary artery bypass graft(s) without angina pectoris: Secondary | ICD-10-CM | POA: Diagnosis not present

## 2020-07-20 DIAGNOSIS — E785 Hyperlipidemia, unspecified: Secondary | ICD-10-CM | POA: Diagnosis not present

## 2020-07-21 DIAGNOSIS — G4700 Insomnia, unspecified: Secondary | ICD-10-CM | POA: Diagnosis not present

## 2020-07-21 DIAGNOSIS — I25810 Atherosclerosis of coronary artery bypass graft(s) without angina pectoris: Secondary | ICD-10-CM | POA: Diagnosis not present

## 2020-07-21 DIAGNOSIS — M4306 Spondylolysis, lumbar region: Secondary | ICD-10-CM | POA: Diagnosis not present

## 2020-07-21 DIAGNOSIS — I999 Unspecified disorder of circulatory system: Secondary | ICD-10-CM | POA: Diagnosis not present

## 2020-07-21 DIAGNOSIS — E785 Hyperlipidemia, unspecified: Secondary | ICD-10-CM | POA: Diagnosis not present

## 2020-07-21 DIAGNOSIS — E1159 Type 2 diabetes mellitus with other circulatory complications: Secondary | ICD-10-CM | POA: Diagnosis not present

## 2020-07-21 DIAGNOSIS — F418 Other specified anxiety disorders: Secondary | ICD-10-CM | POA: Diagnosis not present

## 2020-07-21 DIAGNOSIS — M47818 Spondylosis without myelopathy or radiculopathy, sacral and sacrococcygeal region: Secondary | ICD-10-CM | POA: Diagnosis not present

## 2020-07-21 DIAGNOSIS — I119 Hypertensive heart disease without heart failure: Secondary | ICD-10-CM | POA: Diagnosis not present

## 2020-07-26 DIAGNOSIS — I119 Hypertensive heart disease without heart failure: Secondary | ICD-10-CM | POA: Diagnosis not present

## 2020-07-26 DIAGNOSIS — I25810 Atherosclerosis of coronary artery bypass graft(s) without angina pectoris: Secondary | ICD-10-CM | POA: Diagnosis not present

## 2020-07-26 DIAGNOSIS — M4306 Spondylolysis, lumbar region: Secondary | ICD-10-CM | POA: Diagnosis not present

## 2020-07-26 DIAGNOSIS — G4700 Insomnia, unspecified: Secondary | ICD-10-CM | POA: Diagnosis not present

## 2020-07-26 DIAGNOSIS — E1159 Type 2 diabetes mellitus with other circulatory complications: Secondary | ICD-10-CM | POA: Diagnosis not present

## 2020-07-26 DIAGNOSIS — I999 Unspecified disorder of circulatory system: Secondary | ICD-10-CM | POA: Diagnosis not present

## 2020-07-26 DIAGNOSIS — F418 Other specified anxiety disorders: Secondary | ICD-10-CM | POA: Diagnosis not present

## 2020-07-26 DIAGNOSIS — M47818 Spondylosis without myelopathy or radiculopathy, sacral and sacrococcygeal region: Secondary | ICD-10-CM | POA: Diagnosis not present

## 2020-07-26 DIAGNOSIS — E785 Hyperlipidemia, unspecified: Secondary | ICD-10-CM | POA: Diagnosis not present

## 2020-07-27 DIAGNOSIS — F418 Other specified anxiety disorders: Secondary | ICD-10-CM | POA: Diagnosis not present

## 2020-07-27 DIAGNOSIS — I25810 Atherosclerosis of coronary artery bypass graft(s) without angina pectoris: Secondary | ICD-10-CM | POA: Diagnosis not present

## 2020-07-27 DIAGNOSIS — I999 Unspecified disorder of circulatory system: Secondary | ICD-10-CM | POA: Diagnosis not present

## 2020-07-27 DIAGNOSIS — G4700 Insomnia, unspecified: Secondary | ICD-10-CM | POA: Diagnosis not present

## 2020-07-27 DIAGNOSIS — M4306 Spondylolysis, lumbar region: Secondary | ICD-10-CM | POA: Diagnosis not present

## 2020-07-27 DIAGNOSIS — I119 Hypertensive heart disease without heart failure: Secondary | ICD-10-CM | POA: Diagnosis not present

## 2020-07-27 DIAGNOSIS — E1159 Type 2 diabetes mellitus with other circulatory complications: Secondary | ICD-10-CM | POA: Diagnosis not present

## 2020-07-27 DIAGNOSIS — E785 Hyperlipidemia, unspecified: Secondary | ICD-10-CM | POA: Diagnosis not present

## 2020-07-27 DIAGNOSIS — M47818 Spondylosis without myelopathy or radiculopathy, sacral and sacrococcygeal region: Secondary | ICD-10-CM | POA: Diagnosis not present

## 2020-08-02 DIAGNOSIS — I25810 Atherosclerosis of coronary artery bypass graft(s) without angina pectoris: Secondary | ICD-10-CM | POA: Diagnosis not present

## 2020-08-02 DIAGNOSIS — M47818 Spondylosis without myelopathy or radiculopathy, sacral and sacrococcygeal region: Secondary | ICD-10-CM | POA: Diagnosis not present

## 2020-08-02 DIAGNOSIS — F418 Other specified anxiety disorders: Secondary | ICD-10-CM | POA: Diagnosis not present

## 2020-08-02 DIAGNOSIS — G4700 Insomnia, unspecified: Secondary | ICD-10-CM | POA: Diagnosis not present

## 2020-08-02 DIAGNOSIS — E1159 Type 2 diabetes mellitus with other circulatory complications: Secondary | ICD-10-CM | POA: Diagnosis not present

## 2020-08-02 DIAGNOSIS — I119 Hypertensive heart disease without heart failure: Secondary | ICD-10-CM | POA: Diagnosis not present

## 2020-08-02 DIAGNOSIS — E785 Hyperlipidemia, unspecified: Secondary | ICD-10-CM | POA: Diagnosis not present

## 2020-08-02 DIAGNOSIS — I999 Unspecified disorder of circulatory system: Secondary | ICD-10-CM | POA: Diagnosis not present

## 2020-08-02 DIAGNOSIS — M4306 Spondylolysis, lumbar region: Secondary | ICD-10-CM | POA: Diagnosis not present

## 2020-08-03 DIAGNOSIS — M4306 Spondylolysis, lumbar region: Secondary | ICD-10-CM | POA: Diagnosis not present

## 2020-08-03 DIAGNOSIS — F418 Other specified anxiety disorders: Secondary | ICD-10-CM | POA: Diagnosis not present

## 2020-08-03 DIAGNOSIS — I119 Hypertensive heart disease without heart failure: Secondary | ICD-10-CM | POA: Diagnosis not present

## 2020-08-03 DIAGNOSIS — E785 Hyperlipidemia, unspecified: Secondary | ICD-10-CM | POA: Diagnosis not present

## 2020-08-03 DIAGNOSIS — E1159 Type 2 diabetes mellitus with other circulatory complications: Secondary | ICD-10-CM | POA: Diagnosis not present

## 2020-08-03 DIAGNOSIS — I999 Unspecified disorder of circulatory system: Secondary | ICD-10-CM | POA: Diagnosis not present

## 2020-08-03 DIAGNOSIS — G4700 Insomnia, unspecified: Secondary | ICD-10-CM | POA: Diagnosis not present

## 2020-08-03 DIAGNOSIS — I25810 Atherosclerosis of coronary artery bypass graft(s) without angina pectoris: Secondary | ICD-10-CM | POA: Diagnosis not present

## 2020-08-03 DIAGNOSIS — M47818 Spondylosis without myelopathy or radiculopathy, sacral and sacrococcygeal region: Secondary | ICD-10-CM | POA: Diagnosis not present

## 2020-08-14 ENCOUNTER — Other Ambulatory Visit: Payer: Self-pay

## 2020-08-14 ENCOUNTER — Ambulatory Visit (INDEPENDENT_AMBULATORY_CARE_PROVIDER_SITE_OTHER): Payer: Medicare HMO | Admitting: Medical-Surgical

## 2020-08-14 ENCOUNTER — Encounter: Payer: Self-pay | Admitting: Medical-Surgical

## 2020-08-14 VITALS — BP 122/66 | HR 75 | Temp 98.4°F | Ht 59.0 in | Wt 126.7 lb

## 2020-08-14 DIAGNOSIS — E785 Hyperlipidemia, unspecified: Secondary | ICD-10-CM | POA: Diagnosis not present

## 2020-08-14 DIAGNOSIS — I119 Hypertensive heart disease without heart failure: Secondary | ICD-10-CM

## 2020-08-14 DIAGNOSIS — E1159 Type 2 diabetes mellitus with other circulatory complications: Secondary | ICD-10-CM

## 2020-08-14 MED ORDER — SITAGLIPTIN PHOSPHATE 50 MG PO TABS: 50.0000 mg | ORAL_TABLET | Freq: Every day | ORAL | 11 refills | Status: DC

## 2020-08-14 NOTE — Progress Notes (Signed)
Subjective:    CC: Hypertension/diabetes follow-up  HPI: 70 year old female accompanied by her son presents today for the following:  Hypertension-taking metoprolol 25 mg daily, tolerating well without side effects.  Blood pressure looks good today although they are not measuring at home.  She has been lax in her low-sodium diet but otherwise is doing fairly well.  She is very sedentary and is not doing any of the exercises given to her by her physical therapy nurse.  Diabetes-presents with a calendar of fasting and bedtime blood sugars.  Over the last month her lowest blood sugar has been 158 with her highest being 443.  She is taking Rybelsus 14 mg daily and Jardiance 25 mg daily.  Reports difficulty getting blood from her fingers when she does her fingersticks so does not check it regularly every day.  Continuous glucose monitor was ordered at her last visit but unfortunately she has not been able to get this yet.  She is very noncompliant with her recommended diabetic diet and per her son, eats what ever she wants whenever she wants.  If she has a craving and decides that she wants something that is not in the home, she will get in the car and go to the store to buy it.  Patient is able to verbalize the recommended limitations in her diabetic diet but does not follow these.  I reviewed the past medical history, family history, social history, surgical history, and allergies today and no changes were needed.  Please see the problem list section below in epic for further details.  Past Medical History: Past Medical History:  Diagnosis Date    Paroxysmal SVT (ANVRT)    S/p AV nodal ablation Dr. Lovena Le 09/28/08    Anxiety    CAD    Coronary disease status post non-ST elevation MI in May 2010 with  PCI of the left circumflex on Jun 24, 2008. She then had a PCI of  the RCA on July 15, 2008. 02/24/12 Cath, Severe 95% LAD, ostial 95% circ, ostial RCA, patent stents in distal RCA and mid circ normal  LV 02/26/12 CABG with LIMA to LAD, SVG to RCA, and SVG to OM Dr. Roxan Hockey     Cancer [facility])    adenocarcinoma of rectum   Cataract    Depression    Diabetes mellitus, insulin dependent (IDDM), uncontrolled    Heart murmur    Hyperlipidemia    Hypertension    Hypertensive heart disease    S/P radiation therapy 04/11/14-05/18/14   50.4Gy rectal   Vertigo    Result of chemotherapy; has been to ENT and told nothing to be done   Past Surgical History: Past Surgical History:  Procedure Laterality Date   brain tumor removed     2019   COLON SURGERY     2016   COLONOSCOPY     COLONOSCOPY WITH PROPOFOL N/A 07/12/2014   Procedure: COLONOSCOPY WITH PROPOFOL POSSIBLE POLYP RESECTION;  Surgeon: [name], MD;  [facility];  Service: Endoscopy;  Laterality: N/A;   to be admitted after colonoscopy-robotic surgery on 6/8   CORONARY ANGIOPLASTY WITH STENT PLACEMENT     CORONARY ARTERY BYPASS GRAFT  02/26/2012   Roxan Hockey, MD;  Location: [place];  Service: Open Heart Surgery;  Laterality: N/A;  CABG x three,  using left internal mammary artery  and left leg greater saphenous vein,    ENDOVEIN HARVEST OF GREATER SAPHENOUS VEIN  02/26/2012   EUS N/A 03/24/2014   Procedure: LOWER  ENDOSCOPIC ULTRASOUND (EUS);  Surgeon: Milus Banister, MD;  Location: [facility];  Service: Endoscopy;  Laterality: N/A;   LEFT HEART CATHETERIZATION WITH CORONARY ANGIOGRAM N/A 02/24/2012   Procedure: LEFT HEART CATHETERIZATION WITH CORONARY ANGIOGRAM;  Surgeon: Jacolyn Reedy, MD;  Location: [facility] CATH LAB;  Service: Cardiovascular;  Laterality: N/A;   Social History: Social History   Socioeconomic History   Marital status: Divorced    Spouse name: Not on file   Number of children: Not on file   Years of education: Not on file   Highest education level: Not on file  Occupational History   Not on file  Tobacco Use   Smoking status: Never   Smokeless tobacco: Never  Vaping Use   Vaping Use: Never used   Substance and Sexual Activity   Alcohol use: No    Alcohol/week: 0.0 standard drinks   Drug use: No   Sexual activity: Not Currently  Other Topics Concern   Not on file  Social History Narrative   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   Also has a dog   Enjoys reading, gardening, going to movies or just getting out with her sister   [employer] to church and bible study group on  Tuesdays.   Independent and drives   Says she copes with stress by prayer    Social Determinants of Health   Financial Resource Strain: Not on file  Food Insecurity: Not on file  Transportation Needs: Not on file  Physical Activity: Not on file  Stress: Not on file  Social Connections: Not on file   Family History: Family History  Problem Relation Age of Onset   Diabetes Father    Diabetes Brother    Alzheimer's disease Brother 61   Diabetes Sister    Colon polyps Maternal Grandmother    Colon cancer Neg Hx    Esophageal cancer Neg Hx    Liver cancer Neg Hx    Pancreatic cancer Neg Hx    Rectal cancer Neg Hx    Stomach cancer Neg Hx    Allergies: No Known Allergies Medications: See med rec.  Review of Systems: See HPI for pertinent positives and negatives.   Objective:    General: Well Developed, well nourished, and in no acute distress.  Neuro: Alert and oriented x3.  HEENT: Normocephalic, atraumatic.  Skin: Warm and dry. Cardiac: Regular rate and rhythm, no murmurs rubs or gallops, no lower extremity edema.  Respiratory: Clear to auscultation bilaterally. Not using accessory muscles, speaking in full sentences.   Impression and Recommendations:    1. Type 2 diabetes mellitus with vascular disease ([place]) Previously treated successfully with metformin but unfortunately, she did not tolerate this.  On metformin, her A1c levels were very  well controlled.  Since stopping metformin, we have had difficulty managing her diabetes.  POCT A1c completed less than 3 months ago so unable to repeat today.  With her recordings of fasting and p.m. blood sugars, she is still not very well controlled.  Continue Jardiance 25 mg daily and Rybelsus 14 mg daily.  Adding Sitagliptin 50 mg daily.  Strongly recommend compliance with diabetic diet recommendations and avoidance of concentrated sweets and simple carbohydrates.  Patient verbalized understanding.  Reviewed emergency room precautions with patient and son and advised if blood sugars  greater than 400, she should be evaluated as this puts her at risk for DKA.  2. Hypertensive heart disease without heart failure Blood pressure well controlled today.  Continue metoprolol tartrate 25 mg as prescribed.  3. Hyperlipidemia, unspecified hyperlipidemia type Continue Lipitor 10 mg daily.  Return in about 8 weeks (around 10/09/2020) for DM follow up.  ___________________________________________ Clearnce Sorrel, DNP, APRN, FNP-BC [facility] and [place]

## 2020-08-15 ENCOUNTER — Ambulatory Visit (INDEPENDENT_AMBULATORY_CARE_PROVIDER_SITE_OTHER): Payer: Medicare HMO | Admitting: Medical-Surgical

## 2020-08-15 ENCOUNTER — Telehealth: Payer: Self-pay | Admitting: General Practice

## 2020-08-15 DIAGNOSIS — Z0000 Encounter for general adult medical examination without abnormal findings: Secondary | ICD-10-CM

## 2020-08-15 MED ORDER — RYBELSUS 14 MG PO TABS: 14.0000 mg | ORAL_TABLET | Freq: Every day | ORAL | 2 refills | Status: DC

## 2020-08-15 NOTE — Progress Notes (Signed)
MEDICARE ANNUAL WELLNESS VISIT  08/15/2020  Telephone Visit Disclaimer This Medicare AWV was conducted by telephone due to national recommendations for restrictions regarding the COVID-19 Pandemic (e.g. social distancing).  I verified, using two identifiers, that I am speaking with [name] or their authorized healthcare agent. I discussed the limitations, risks, security, and privacy concerns of performing an evaluation and management service by telephone and the potential availability of an in-person appointment in the future. The patient expressed understanding and agreed to proceed.  Location of Patient: Home Location of Provider (nurse):  In the office.  Subjective:    [name] is a 70 y.o. female patient of Samuel Bouche, NP who had a Medicare Annual Wellness Visit today via telephone. [name] is Retired and lives with their son. she has 1 child. she reports that she is socially active and does interact with friends/family regularly. she is minimally physically active and enjoys spending time with her sister.  Patient Care Team: Samuel Bouche, NP as PCP - General (Nurse Practitioner) Jacolyn Reedy, MD as Consulting Physician (Cardiology) Ladene Artist, MD as Consulting Physician (Gastroenterology) [name], MD as Consulting Physician (General Surgery) Kyung Rudd, MD as Consulting Physician (Radiation Oncology)  Advanced Directives 08/15/2020 06/26/2018 11/27/2017 05/29/2017 12/03/2016 05/28/2016 12/08/2015  Does Patient Have a Medical Advance Directive? _0  No No  Would patient like information on creating a medical advance directive? No - Patient declined No - Patient declined - No - Patient declined - - No - patient declined information  Pre-existing out of facility DNR order (yellow form or pink MOST form) - - - - - - -    Hospital Utilization Over the Past 12 Months: # of hospitalizations or ER visits: 0 # of surgeries: 0  Review of Systems     Patient reports that her overall health is worse compared to last year.  History obtained from chart review and the patient  Patient Reported Readings (BP, Pulse, CBG, Weight, etc) none  Pain Assessment Pain : No/denies pain     Current Medications & Allergies (verified) Allergies as of 08/15/2020   No Known Allergies      Medication List        Accurate as of August 15, 2020 11:51 AM. If you have any questions, ask your nurse or doctor.          amitriptyline 50 MG tablet Commonly known as: ELAVIL Take 1 tablet (50 mg total) by mouth at bedtime.   ARIPiprazole 10 MG tablet Commonly known as: ABILIFY Take 1 tablet (10 mg total) by mouth daily.   aspirin 81 MG tablet Take 81 mg by mouth at bedtime.   atorvastatin 10 MG tablet Commonly known as: LIPITOR Take 1 tablet (10 mg total) by mouth daily.   Dexcom G6 Sensor Misc Apply 1 sensor to the back of the upper arm. Sensors good for 10 days. After 10 days, remove sensor and place a new one. Alternate arms for sensor placement.   empagliflozin 25 MG Tabs tablet Commonly known as: Jardiance Take 1 tablet (25 mg total) by mouth daily before breakfast.   fluconazole 150 MG tablet Commonly known as: DIFLUCAN Take 1 tablet on day 1. Take the second tablet 3 days later.   metoprolol tartrate 25 MG tablet Commonly known as: LOPRESSOR Take 1 tablet (25 mg total) by mouth 2 (two) times daily. TAKE 1 TABLET(25 MG) BY MOUTH TWICE DAILY   Rybelsus 14 MG Tabs Generic drug:  Semaglutide Take 14 mg by mouth daily.   sitaGLIPtin 50 MG tablet Commonly known as: JANUVIA Take 1 tablet (50 mg total) by mouth daily.   traMADol 50 MG tablet Commonly known as: ULTRAM TAKE 1 TABLET(50 MG) BY MOUTH EVERY 8 HOURS AS NEEDED   True Metrix Blood Glucose Test test strip Generic drug: glucose blood TEST BLOOD SUGAR ONE TIME DAILY   True Metrix Meter w/Device Kit USE AS DIRECTED   TRUEplus Lancets 33G Misc USE DAILY TO CHECK  BLOOD SUGAR.   Vitamin D (Ergocalciferol) 1.25 MG (50000 UNIT) Caps capsule Commonly known as: DRISDOL Take 1 capsule (50,000 Units total) by mouth every 7 (seven) days.        History (reviewed): Past Medical History:  Diagnosis Date    Paroxysmal SVT (ANVRT)    S/p AV nodal ablation Dr. Lovena Le 09/28/08    Anxiety    CAD    Coronary disease status post non-ST elevation MI in May 2010 with  PCI of the left circumflex on Jun 24, 2008. She then had a PCI of  the RCA on July 15, 2008. 02/24/12 Cath, Severe 95% LAD, ostial 95% circ, ostial RCA, patent stents in distal RCA and mid circ normal LV 02/26/12 CABG with LIMA to LAD, SVG to RCA, and SVG to OM Dr. Roxan Hockey     Cancer [facility])    adenocarcinoma of rectum   Cataract    Depression    Diabetes mellitus, insulin dependent (IDDM), uncontrolled    Heart murmur    Hyperlipidemia    Hypertension    Hypertensive heart disease    S/P radiation therapy 04/11/14-05/18/14   50.4Gy rectal   Vertigo    Result of chemotherapy; has been to ENT and told nothing to be done   Past Surgical History:  Procedure Laterality Date   brain tumor removed     2019   COLON SURGERY     2016   COLONOSCOPY     COLONOSCOPY WITH PROPOFOL N/A 07/12/2014   Procedure: COLONOSCOPY WITH PROPOFOL POSSIBLE POLYP RESECTION;  Surgeon: [name], MD;  Location: [facility] END[facility];  Service: Endoscopy;  Laterality: N/A;   to be admitted after colonoscopy-robotic surgery on 6/8   CORONARY ANGIOPLASTY WITH STENT PLACEMENT     CORONARY ARTERY BYPASS GRAFT  02/26/2012   Roxan Hockey, MD;  Location: [facility];  Service: Open Heart Surgery;  Laterality: N/A;  CABG x three,  using left internal mammary artery  and left leg greater saphenous vein,    ENDOVEIN HARVEST OF GREATER SAPHENOUS VEIN  02/26/2012   EUS N/A 03/24/2014   Procedure: LOWER ENDOSCOPIC ULTRASOUND (EUS);  Surgeon: Milus Banister, MD;  Location: [facility];  Service: Endoscopy;  Laterality: N/A;   LEFT HEART  CATHETERIZATION WITH CORONARY ANGIOGRAM N/A 02/24/2012   Procedure: LEFT HEART CATHETERIZATION WITH CORONARY ANGIOGRAM;  Surgeon: Jacolyn Reedy, MD;  Location: [facility] CATH LAB;  Service: Cardiovascular;  Laterality: N/A;   Family History  Problem Relation Age of Onset   Diabetes Father    Diabetes Brother    Alzheimer's disease Brother 42   Diabetes Sister    Colon polyps Maternal Grandmother    Colon cancer Neg Hx    Esophageal cancer Neg Hx    Liver cancer Neg Hx    Pancreatic cancer Neg Hx    Rectal cancer Neg Hx    Stomach cancer Neg Hx    Social History   Socioeconomic History   Marital status: Divorced  Spouse name: Not on file   Number of children: 1   Years of education: 25   Highest education level: 12th grade  Occupational History   Not on file  Tobacco Use   Smoking status: Never   Smokeless tobacco: Never  Vaping Use   Vaping Use: Never used  Substance and Sexual Activity   Alcohol use: No    Alcohol/week: 0.0 standard drinks   Drug use: No   Sexual activity: Not Currently  Other Topics Concern   Not on file  Social History Narrative   She lives with her son. Enjoys reading, gardening, going to movies or just getting out with her sister.   Social Determinants of Health   Financial Resource Strain: Low Risk    Difficulty of Paying Living Expenses: Not hard at all  Food Insecurity: No Food Insecurity   Worried About Charity fundraiser in the Last Year: Never true   [place] in the Last Year: Never true  Transportation Needs: No Transportation Needs   Lack of Transportation (Medical): No   Lack of Transportation (Non-Medical): No  Physical Activity: Inactive   Days of Exercise per Week: 0 days   Minutes of Exercise per Session: 0 min  Stress: No Stress Concern Present   Feeling of Stress : Not at all  Social Connections: Moderately Isolated   Frequency of Communication with Friends and Family: More than three times a week   Frequency of  Social Gatherings with Friends and Family: Never   Attends Religious Services: More than 4 times per year   Active Member of [employer] or Organizations: No   Attends Archivist Meetings: Never   Marital Status: Divorced    Activities of Daily Living In your present state of health, do you have any difficulty performing the following activities: 08/15/2020 11/08/2019  Hearing? Y Y  Comment Has noticed a lot of hearing difficulty -  Vision? N N  Difficulty concentrating or making decisions? N N  Walking or climbing stairs? N N  Dressing or bathing? N N  Doing errands, shopping? N N  Preparing Food and eating ? N -  Using the Toilet? N -  In the past six months, have you accidently leaked urine? Y -  Comment has noticed leaking urine. -  Do you have problems with loss of bowel control? N -  Managing your Medications? Y -  Comment her son helps her with setting up her pill box -  Managing your Finances? N -  Housekeeping or managing your Housekeeping? N -  Some recent data might be hidden    Patient Education/ Literacy How often do you need to have someone help you when you read instructions, pamphlets, or other written materials from your doctor or pharmacy?: 1 - Never What is the last grade level you completed in school?: 12th grade  Exercise Current Exercise Habits: The patient does not participate in regular exercise at present, Exercise limited by: None identified  Diet Patient reports consuming 3 meals a day and 3 snack(s) a day Patient reports that her primary diet is: Regular Patient reports that she does have regular access to food.   Depression Screen PHQ 2/9 Scores 08/15/2020 01/10/2020 12/13/2019 11/08/2019 08/10/2019 10/27/2017 04/12/2016  PHQ - 2 Score 0 _0 0 0 0  PHQ- 9 Score - _1 - -     Fall Risk Fall Risk  08/15/2020 08/10/2019 10/27/2017 04/12/2016 05/31/2015  Falls in  the past year? 0 0 Yes No No  Number falls in past yr: 0 - 2 or more - -  Injury with  Fall? 0 - Yes - -  Risk Factor Category  - - High Fall Risk - -  Risk for fall due to : No Fall Risks - History of fall(s);Impaired balance/gait - -  Follow up Falls evaluation completed Falls evaluation completed - - -     Objective:  Leni Pankonin seemed alert and oriented and she participated appropriately during our telephone visit.  Blood Pressure Weight BMI  BP Readings from Last 3 Encounters:  08/14/20 122/66  06/05/20 105/67  03/06/20 123/75   Wt Readings from Last 3 Encounters:  08/14/20 126 lb 11.2 oz (57.5 kg)  06/05/20 129 lb 6.4 oz (58.7 kg)  03/06/20 128 lb 12.8 oz (58.4 kg)   BMI Readings from Last 1 Encounters:  08/14/20 25.59 kg/m    *Unable to obtain current vital signs, weight, and BMI due to telephone visit type  Hearing/Vision  Charla did not seem to have difficulty with hearing/understanding during the telephone conversation Reports that she has not had a formal eye exam by an eye care professional within the past year. She is scheduled for one in August.  Reports that she has not had a formal hearing evaluation within the past year *Unable to fully assess hearing and vision during telephone visit type  Cognitive Function: 6CIT Screen 08/15/2020  What Year? 0 points  What month? 0 points  What time? 0 points  Count back from 20 0 points  Months in reverse 2 points  Repeat phrase 0 points  Total Score 2   (Normal:0-7, Significant for Dysfunction: >8)  Normal Cognitive Function Screening: Yes   Immunization & Health Maintenance Record Immunization History  Administered Date(s) Administered   Fluad Quad(high Dose 65+) 11/03/2018, 11/08/2019   Influenza Split 12/23/2011   Influenza Whole 11/14/2012   Influenza, High Dose Seasonal PF 10/13/2015, 11/27/2017   Influenza,inj,Quad PF,6+ Mos 12/02/2013   Influenza-Unspecified 12/26/2014, 11/19/2016, 11/03/2018   Pneumococcal Conjugate-13 10/13/2015   Pneumococcal Polysaccharide-23 02/05/2017   Tdap  02/05/2018    Health Maintenance  Topic Date Due   OPHTHALMOLOGY EXAM  08/15/2020 (Originally 04/05/2019)   COVID-19 Vaccine (1) 08/30/2020 (Originally 03/23/1955)   MAMMOGRAM  11/07/2020 (Originally 05/24/2018)   DEXA SCAN  11/07/2020 (Originally 03/23/2014)   Zoster Vaccines- Shingrix (1 of 2) 11/15/2020 (Originally 03/23/1969)   INFLUENZA VACCINE  09/04/2020   FOOT EXAM  11/07/2020   URINE MICROALBUMIN  11/07/2020   HEMOGLOBIN A1C  12/06/2020   COLONOSCOPY (Pts 45-7yr Insurance coverage will need to be confirmed)  08/01/2021   TETANUS/TDAP  02/06/2028   Hepatitis C Screening  Completed   PNA vac Low Risk Adult  Completed   HPV VACCINES  Aged Out       Assessment  This is a routine wellness examination for [employer]  Health Maintenance: Due or Overdue There are no preventive care reminders to display for this patient.   SSkyelynn Rambeaudoes not need a referral for Community Assistance: Care Management:   no Social Work:    no Prescription Assistance:  no Nutrition/Diabetes Education:  no   Plan:  Personalized Goals  Goals Addressed               This Visit's Progress     Patient Stated (pt-stated)        08/15/2020 AWV Goal: Diabetes Management  Patient will maintain an A1C level below  8.0 Patient will not develop any diabetic foot complications Patient will not experience any hypoglycemic episodes over the next 3 months Patient will notify our office of any CBG readings outside of the provider recommended range by calling [phone] Patient will adhere to provider recommendations for diabetes management  Patient Self Management Activities take all medications as prescribed and report any negative side effects monitor and record blood sugar readings as directed adhere to a low carbohydrate diet that incorporates lean proteins, vegetables, whole grains, low glycemic fruits check feet daily noting any sores, cracks, injuries, or callous formations see PCP  or podiatrist if she notices any changes in her legs, feet, or toenails Patient will visit PCP and have an A1C level checked every 3 to 6 months as directed  have a yearly eye exam to monitor for vascular changes associated with diabetes and will request that the report be sent to her pcp.  consult with her PCP regarding any changes in her health or new or worsening symptoms         Personalized Health Maintenance & Screening Recommendations  Screening mammography Bone densitometry screening Shingles vaccine  Lung Cancer Screening Recommended: no (Low Dose CT Chest recommended if Age 47-80 years, 30 pack-year currently smoking OR have quit w/in past 15 years) Hepatitis C Screening recommended: yes HIV Screening recommended: yes  Advanced Directives: Written information was not prepared per patient's request.  Referrals & Orders No orders of the defined types were placed in this encounter.   Follow-up Plan Follow-up with Samuel Bouche, NP as planned Schedule your shingles vaccine at your pharmacy.  Please let us know if you change your mind about the mammogram or bone density referral.  Medicare wellness in one year. AVS printed and mailed to the patient.   I have personally reviewed and noted the following in the patient's chart:   Medical and social history Use of alcohol, tobacco or illicit drugs  Current medications and supplements Functional ability and status Nutritional status Physical activity Advanced directives List of other physicians Hospitalizations, surgeries, and ER visits in previous 12 months Vitals Screenings to include cognitive, depression, and falls Referrals and appointments  In addition, I have reviewed and discussed with [name] certain preventive protocols, quality metrics, and best practice recommendations. A written personalized care plan for preventive services as well as general preventive health recommendations is available and can be  mailed to the patient at her request.      Tinnie Gens, RN  08/15/2020

## 2020-08-15 NOTE — Telephone Encounter (Signed)
Patient advised.

## 2020-08-15 NOTE — Telephone Encounter (Signed)
Patient requested a refill for Rybelsus during her medicare wellness visit. She wants it faxed to [facility] in [place] with a 30 day supply.

## 2020-08-15 NOTE — Patient Instructions (Addendum)
  [place] Maintenance Summary and Written Plan of Care  [name] ,  Thank you for allowing me to perform your Medicare Annual Wellness Visit and for your ongoing commitment to your health.   Health Maintenance & Immunization History Health Maintenance  Topic Date Due   OPHTHALMOLOGY EXAM  08/15/2020 (Originally 04/05/2019)   COVID-19 Vaccine (1) 08/30/2020 (Originally 03/23/1955)   MAMMOGRAM  11/07/2020 (Originally 05/24/2018)   DEXA SCAN  11/07/2020 (Originally 03/23/2014)   Zoster Vaccines- Shingrix (1 of 2) 11/15/2020 (Originally 03/23/1969)   INFLUENZA VACCINE  09/04/2020   FOOT EXAM  11/07/2020   URINE MICROALBUMIN  11/07/2020   HEMOGLOBIN A1C  12/06/2020   COLONOSCOPY (Pts 45-33yrs Insurance coverage will need to be confirmed)  08/01/2021   TETANUS/TDAP  02/06/2028   Hepatitis C Screening  Completed   PNA vac Low Risk Adult  Completed   HPV VACCINES  Aged Out   Immunization History  Administered Date(s) Administered   Fluad Quad(high Dose 65+) 11/03/2018, 11/08/2019   Influenza Split 12/23/2011   Influenza Whole 11/14/2012   Influenza, High Dose Seasonal PF 10/13/2015, 11/27/2017   Influenza,inj,Quad PF,6+ Mos 12/02/2013   Influenza-Unspecified 12/26/2014, 11/19/2016, 11/03/2018   Pneumococcal Conjugate-13 10/13/2015   Pneumococcal Polysaccharide-23 02/05/2017   Tdap 02/05/2018    These are the patient goals that we discussed:  Goals Addressed               This Visit's Progress     Patient Stated (pt-stated)        08/15/2020 AWV Goal: Diabetes Management  Patient will maintain an A1C level below 8.0 Patient will not develop any diabetic foot complications Patient will not experience any hypoglycemic episodes over the next 3 months Patient will notify our office of any CBG readings outside of the provider recommended range by calling [phone] Patient will adhere to provider recommendations for diabetes management  Patient  Self Management Activities take all medications as prescribed and report any negative side effects monitor and record blood sugar readings as directed adhere to a low carbohydrate diet that incorporates lean proteins, vegetables, whole grains, low glycemic fruits check feet daily noting any sores, cracks, injuries, or callous formations see PCP or podiatrist if she notices any changes in her legs, feet, or toenails Patient will visit PCP and have an A1C level checked every 3 to 6 months as directed  have a yearly eye exam to monitor for vascular changes associated with diabetes and will request that the report be sent to her pcp.  consult with her PCP regarding any changes in her health or new or worsening symptoms           This is a list of Health Maintenance Items that are overdue or due now: Screening mammography Bone densitometry screening Shingles vaccine  Orders/Referrals Placed Today: No orders of the defined types were placed in this encounter.  (Contact our referral department at [phone] if you have not spoken with someone about your referral appointment within the next 5 days)    Follow-up Plan Follow-up with Samuel Bouche, NP as planned Schedule your shingles vaccine at your pharmacy.  Please let us know if you change your mind about the mammogram or bone density referral.  Medicare wellness in one year. AVS printed and mailed to the patient.

## 2020-08-15 NOTE — Telephone Encounter (Signed)
Rybelsus refill sent to [facility] per request.  Clearnce Sorrel, DNP, APRN, FNP-BC [facility]

## 2020-08-15 NOTE — Addendum Note (Signed)
Addended bySamuel Bouche on: 08/15/2020 01:40 PM   Modules accepted: Orders

## 2020-08-21 ENCOUNTER — Telehealth: Payer: Self-pay

## 2020-08-21 NOTE — Telephone Encounter (Signed)
FYI::[name] with [facility] pharmacy in [place] wanted to call and get clarification on medications. She states that the pt has an active Rx for both Rybelsus and Januvia. Myriam Jacobson states these meds pretty much do the same thing in the body. She asked [facility] about this and was told she is supposed to be taking both meds. I spoke with Joy who stated that the pt is supposed to be on both Rx's.  I called pharmacy and spoke with Myriam Jacobson and informed them that pt is to be on both Rybelsus and Januvia.

## 2020-09-07 ENCOUNTER — Telehealth: Payer: Self-pay

## 2020-09-07 NOTE — Telephone Encounter (Signed)
Medication: Rybelsus 14 mg Prior authorization determination received Medication has been approved Approved until 02/03/2021 Pt aware via MyChart message

## 2020-09-11 ENCOUNTER — Ambulatory Visit: Payer: Medicare HMO | Admitting: Medical-Surgical

## 2020-09-25 ENCOUNTER — Other Ambulatory Visit: Payer: Self-pay

## 2020-09-25 ENCOUNTER — Ambulatory Visit (INDEPENDENT_AMBULATORY_CARE_PROVIDER_SITE_OTHER): Payer: Medicare HMO | Admitting: Medical-Surgical

## 2020-09-25 ENCOUNTER — Encounter: Payer: Self-pay | Admitting: Medical-Surgical

## 2020-09-25 VITALS — BP 109/71 | HR 91 | Resp 20 | Ht 59.0 in | Wt 125.4 lb

## 2020-09-25 DIAGNOSIS — I119 Hypertensive heart disease without heart failure: Secondary | ICD-10-CM | POA: Diagnosis not present

## 2020-09-25 DIAGNOSIS — E1159 Type 2 diabetes mellitus with other circulatory complications: Secondary | ICD-10-CM

## 2020-09-25 DIAGNOSIS — E785 Hyperlipidemia, unspecified: Secondary | ICD-10-CM | POA: Diagnosis not present

## 2020-09-25 DIAGNOSIS — E118 Type 2 diabetes mellitus with unspecified complications: Secondary | ICD-10-CM

## 2020-09-25 LAB — POCT GLYCOSYLATED HEMOGLOBIN (HGB A1C): HbA1c, POC (controlled diabetic range): 7.5 % — AB (ref 0.0–7.0)

## 2020-09-25 MED ORDER — DEXCOM G6 TRANSMITTER MISC: 1.0000 | Freq: Once | 0 refills | Status: AC

## 2020-09-25 MED ORDER — DEXCOM G6 SENSOR MISC: 3 refills | Status: DC

## 2020-09-25 MED ORDER — DEXCOM G6 RECEIVER DEVI: 1.0000 | Freq: Once | 0 refills | Status: AC

## 2020-09-25 NOTE — Progress Notes (Signed)
  HPI with pertinent ROS:   CC: DM follow up  HPI: Pleasant 70 year old female accompanied by her son presenting today for diabetes follow-up.  She has not been checking her sugars at home because she says she cannot get blood out of her fingers.  Her son reports he is able to get blood and we are able to get blood here in our office but she states that she has tried and cannot do it.  She is currently taking Jardiance 25 mg daily and Rybelsus 14 mg daily, tolerating both well without side effects.  She was started on Januvia 50 mg daily but took this for 5 days and had significant diarrhea.  She when she stopped the Januvia, her diarrhea resolved.  She continues to be liberal with her diet and although she is aware she should not eat concentrated sweets and simple carbohydrates, she does partake of them often.  Has an upcoming vision appointment.  No new nephropathy symptoms.  I reviewed the past medical history, family history, social history, surgical history, and allergies today and no changes were needed.  Please see the problem list section below in epic for further details.   Physical exam:   General: Well Developed, well nourished, and in no acute distress.  Neuro: Alert and oriented x3.  HEENT: Normocephalic, atraumatic.  Skin: Warm and dry. Cardiac: Regular rate and rhythm, no murmurs rubs or gallops, no lower extremity edema.  Respiratory: Clear to auscultation bilaterally. Not using accessory muscles, speaking in full sentences.  Impression and Recommendations:    1. Type 2 diabetes mellitus with vascular disease (HCC) POCT hemoglobin A1c 7.5% today.  Prior A1c was 8.8% so she has had some significant improvement.  Adding Januvia to her intolerance list.  Continue Jardiance 25 mg and Rybelsus 14 mg daily as prescribed.  Strongly recommend checking sugars.  Continuous glucose monitor order sent although I do not believe this is covered by her pharmacy.  Discussed compliance with  diabetic diet.  At this point, her dietary habits are the biggest limiting factor outside of medication side effects. - POCT glycosylated hemoglobin (Hb A1C)  2. Hypertensive heart disease without heart failure Checking CBC and CMP.  Blood pressure looks good today on metoprolol 25 mg. - CBC with Differential/Platelet - COMPLETE METABOLIC PANEL WITH GFR  3. Hyperlipidemia, unspecified hyperlipidemia type Checking lipid panel today.  Continue atorvastatin 10 mg daily. - Lipid panel  No follow-ups on file. ___________________________________________ Clearnce Sorrel, DNP, APRN, FNP-BC [facility] [place]

## 2020-09-26 LAB — CBC WITH DIFFERENTIAL/PLATELET
Absolute Monocytes: 366 cells/uL (ref 200–950)
Basophils Absolute: 32 cells/uL (ref 0–200)
Basophils Relative: 0.6 %
Eosinophils Absolute: 69 cells/uL (ref 15–500)
Eosinophils Relative: 1.3 %
HCT: 41.8 % (ref 35.0–45.0)
Hemoglobin: 14.4 g/dL (ref 11.7–15.5)
Lymphs Abs: 1102 cells/uL (ref 850–3900)
MCH: 33.3 pg — ABNORMAL HIGH (ref 27.0–33.0)
MCHC: 34.4 g/dL (ref 32.0–36.0)
MCV: 96.5 fL (ref 80.0–100.0)
MPV: 9.5 fL (ref 7.5–12.5)
Monocytes Relative: 6.9 %
Neutro Abs: 3731 cells/uL (ref 1500–7800)
Neutrophils Relative %: 70.4 %
Platelets: 159 10*3/uL (ref 140–400)
RBC: 4.33 10*6/uL (ref 3.80–5.10)
RDW: 12.6 % (ref 11.0–15.0)
Total Lymphocyte: 20.8 %
WBC: 5.3 10*3/uL (ref 3.8–10.8)

## 2020-09-26 LAB — LIPID PANEL
Cholesterol: 137 mg/dL (ref <200)
HDL: 34 mg/dL — ABNORMAL LOW (ref >=50)
LDL Cholesterol (Calc): 59 mg/dL (calc)
Non-HDL Cholesterol (Calc): 103 mg/dL (calc) (ref <130)
Total CHOL/HDL Ratio: 4 (calc) (ref <5.0)
Triglycerides: 364 mg/dL — ABNORMAL HIGH (ref <150)

## 2020-09-26 LAB — COMPLETE METABOLIC PANEL WITH GFR
AG Ratio: 1.6 (calc) (ref 1.0–2.5)
ALT: 18 U/L (ref 6–29)
AST: 18 U/L (ref 10–35)
Albumin: 3.9 g/dL (ref 3.6–5.1)
Alkaline phosphatase (APISO): 47 U/L (ref 37–153)
BUN: 11 mg/dL (ref 7–25)
CO2: 30 mmol/L (ref 20–32)
Calcium: 9.4 mg/dL (ref 8.6–10.4)
Chloride: 103 mmol/L (ref 98–110)
Creat: 0.79 mg/dL (ref 0.60–1.00)
Globulin: 2.5 g/dL (calc) (ref 1.9–3.7)
Glucose, Bld: 254 mg/dL — ABNORMAL HIGH (ref 65–99)
Potassium: 4.6 mmol/L (ref 3.5–5.3)
Sodium: 141 mmol/L (ref 135–146)
Total Bilirubin: 0.5 mg/dL (ref 0.2–1.2)
Total Protein: 6.4 g/dL (ref 6.1–8.1)
eGFR: 80 mL/min/{1.73_m2} (ref >=60)

## 2020-09-29 DIAGNOSIS — H53451 Other localized visual field defect, right eye: Secondary | ICD-10-CM | POA: Diagnosis not present

## 2020-09-29 DIAGNOSIS — E113511 Type 2 diabetes mellitus with proliferative diabetic retinopathy with macular edema, right eye: Secondary | ICD-10-CM | POA: Diagnosis not present

## 2020-09-29 DIAGNOSIS — E113592 Type 2 diabetes mellitus with proliferative diabetic retinopathy without macular edema, left eye: Secondary | ICD-10-CM | POA: Diagnosis not present

## 2020-09-29 LAB — HM DIABETES EYE EXAM

## 2020-10-05 DIAGNOSIS — E113313 Type 2 diabetes mellitus with moderate nonproliferative diabetic retinopathy with macular edema, bilateral: Secondary | ICD-10-CM | POA: Diagnosis not present

## 2020-10-05 DIAGNOSIS — H3582 Retinal ischemia: Secondary | ICD-10-CM | POA: Diagnosis not present

## 2020-10-05 DIAGNOSIS — H43813 Vitreous degeneration, bilateral: Secondary | ICD-10-CM | POA: Diagnosis not present

## 2020-10-05 DIAGNOSIS — D3131 Benign neoplasm of right choroid: Secondary | ICD-10-CM | POA: Diagnosis not present

## 2020-10-30 ENCOUNTER — Telehealth: Payer: Self-pay | Admitting: Family Medicine

## 2020-10-30 DIAGNOSIS — R413 Other amnesia: Secondary | ICD-10-CM

## 2020-10-30 NOTE — Telephone Encounter (Signed)
Dr. Madilyn Fireman      Ms. Grand's son called and inquired about a referral to Neurology for some cognitive testing. He is her caregiver and stated recently she had called the police for a car in her driveway that belongs to him. He feels her memory is slipping and would like to have testing done. He would like to go to [facility]. Please advise    Jenny Reichmann

## 2020-10-30 NOTE — Telephone Encounter (Signed)
Okay to place referral

## 2020-11-01 NOTE — Telephone Encounter (Signed)
Referral placed.

## 2020-12-14 ENCOUNTER — Other Ambulatory Visit: Payer: Self-pay

## 2020-12-14 DIAGNOSIS — E1159 Type 2 diabetes mellitus with other circulatory complications: Secondary | ICD-10-CM

## 2020-12-14 MED ORDER — RYBELSUS 14 MG PO TABS: 14.0000 mg | ORAL_TABLET | Freq: Every day | ORAL | 2 refills | Status: DC

## 2020-12-20 ENCOUNTER — Other Ambulatory Visit: Payer: Self-pay

## 2020-12-20 DIAGNOSIS — F418 Other specified anxiety disorders: Secondary | ICD-10-CM

## 2020-12-20 MED ORDER — ARIPIPRAZOLE 10 MG PO TABS: 10.0000 mg | ORAL_TABLET | Freq: Every day | ORAL | 0 refills | Status: DC

## 2020-12-25 ENCOUNTER — Ambulatory Visit (INDEPENDENT_AMBULATORY_CARE_PROVIDER_SITE_OTHER): Payer: Medicare HMO | Admitting: Medical-Surgical

## 2020-12-25 ENCOUNTER — Encounter: Payer: Self-pay | Admitting: Medical-Surgical

## 2020-12-25 ENCOUNTER — Other Ambulatory Visit: Payer: Self-pay

## 2020-12-25 ENCOUNTER — Telehealth: Payer: Self-pay | Admitting: *Deleted

## 2020-12-25 VITALS — BP 111/67 | HR 82 | Resp 20 | Ht 59.0 in | Wt 123.7 lb

## 2020-12-25 DIAGNOSIS — E1159 Type 2 diabetes mellitus with other circulatory complications: Secondary | ICD-10-CM

## 2020-12-25 DIAGNOSIS — Z23 Encounter for immunization: Secondary | ICD-10-CM

## 2020-12-25 DIAGNOSIS — R296 Repeated falls: Secondary | ICD-10-CM | POA: Diagnosis not present

## 2020-12-25 LAB — POCT GLYCOSYLATED HEMOGLOBIN (HGB A1C): HbA1c, POC (controlled diabetic range): 7.7 % — AB (ref 0.0–7.0)

## 2020-12-25 MED ORDER — BLOOD GLUCOSE MONITOR KIT: PACK | 0 refills | Status: AC

## 2020-12-25 NOTE — Progress Notes (Signed)
  HPI with pertinent ROS:   CC: Diabetes follow-up  HPI: Pleasant 70 year old female accompanied by her son presenting today for diabetes follow-up.  Since her last visit, she has been taking Jardiance 25 mg and semaglutide 14 mg daily, tolerating well without side effects.  Is not checking her blood sugars and is not following a diabetic diet.  Notes that her glucometer is showing an error code and she is unable to get blood from her fingertips.  Her son states that she has been falling a lot.  Over the past month or so, she has had at least 1 fall per week.  She is unsure why she is falling and wonders if she may be tripping over things.  Denies dizziness and syncope spells.  No significant injury although her son does have pictures of her facial bruising from one of her falls a couple of months ago.  She is wearing a life alert necklace now just in case her son is not around to be able to get her out of the floor.  She has a cane and walker at home but she does not use either of them.  Taking amitriptyline 50 mg nightly as prescribed.  Using tramadol 50 mg as needed but only takes this sparingly.  I reviewed the past medical history, family history, social history, surgical history, and allergies today and no changes were needed.  Please see the problem list section below in epic for further details.   Physical exam:   General: Well Developed, well nourished, and in no acute distress.  Neuro: Alert and oriented x3.  HEENT: Normocephalic, atraumatic.  Skin: Warm and dry. Cardiac: Regular rate and rhythm, no murmurs rubs or gallops, no lower extremity edema.  Respiratory: Clear to auscultation bilaterally. Not using accessory muscles, speaking in full sentences.  Impression and Recommendations:    1. Type 2 diabetes mellitus with vascular disease (HCC) POCT hemoglobin A1c up to 7.7% from 7.5% 3 months ago.  At this point, we are unable to get good control of her diabetes due to dietary  noncompliance and not checking sugars at home for feedback.  Printed prescription for new glucometer provided today.  Continue Jardiance and Rybelsus as ordered.  Referring to community care coordination for pharmacist evaluation of medications and suggestions on her further management. - POCT HgB A1C - AMB Referral to [place]  2. Falls frequently Unclear etiology.  Suspect this is multifactorial.  Amitriptyline as a possible contributor however we were unable to find a good regimen to manage both her mood and her insomnia.  Also suspect physical debility and poor compliance with recommendations of home physical therapy are contributing.  Encourage patient to use her walker or cane when getting around as this will likely help prevent some of her falls.  We will have our pharmacist evaluate her medications so that we can eliminate any potential contributors to falls.  She also has a visit with neurology coming up in the month which may provide some insight.  3. Flu vaccine need Flu vaccine given in office today. - Flu Vaccine QUAD High Dose(Fluad)  Return in about 3 months (around 03/27/2021) for DM follow up. ___________________________________________ Clearnce Sorrel, DNP, APRN, FNP-BC [facility] [place]

## 2020-12-25 NOTE — Chronic Care Management (AMB) (Signed)
  Chronic Care Management   Note  12/25/2020 Name: [name] MRN: [id number] DOB: [birth year]  [name] is a 70 y.o. year old female who is a primary care patient of Samuel Bouche, NP. I reached out to [name] by phone today in response to a referral sent by [name] PCP.  Ms. Brouse was given information about Chronic Care Management services today including:  CCM service includes personalized support from designated clinical staff supervised by her physician, including individualized plan of care and coordination with other care providers 24/7 contact phone numbers for assistance for urgent and routine care needs. Service will only be billed when office clinical staff spend 20 minutes or more in a month to coordinate care. Only one practitioner may furnish and bill the service in a calendar month. The patient may stop CCM services at any time (effective at the end of the month) by phone call to the office staff. The patient is responsible for co-pay (up to 20% after annual deductible is met) if co-pay is required by the individual health plan.   Patient agreed to services and verbal consent obtained.   Follow up plan: Telephone appointment with care management team member scheduled for: 01/02/2021  Julian Hy, [place] [facility]  Direct Dial: [phone]

## 2021-01-02 ENCOUNTER — Ambulatory Visit (INDEPENDENT_AMBULATORY_CARE_PROVIDER_SITE_OTHER): Payer: Medicare HMO | Admitting: Pharmacist

## 2021-01-02 ENCOUNTER — Other Ambulatory Visit: Payer: Self-pay

## 2021-01-02 DIAGNOSIS — I119 Hypertensive heart disease without heart failure: Secondary | ICD-10-CM

## 2021-01-02 DIAGNOSIS — E1159 Type 2 diabetes mellitus with other circulatory complications: Secondary | ICD-10-CM

## 2021-01-02 DIAGNOSIS — E785 Hyperlipidemia, unspecified: Secondary | ICD-10-CM

## 2021-01-02 NOTE — Progress Notes (Signed)
Chronic Care Management Pharmacy Note  01/02/2021 Name:  [name] MRN:  [id number] DOB:  06/11/1950  Summary: addressed DM, HTN, HLD. Patient a bit uncertain about her medications, was unclear what is being taken routinely. States she has help from son who fills pill box, and is a truck driver so he travels through the weeks.  Recommendations/Changes made from today's visit:  - Recommend consider decrease metoprolol to 12.28m BID vs trial discontinue altogether, question ongoing need for this medication. - Recommend patient and son come on-site for next appointment with pill box, medication bottles to review adherence/organization to better support them in their routine.  Plan: F/u with pharmacist in 2-4 weeks  Subjective: [name] an 70y.o. year old female who is a primary patient of Danielle Bouche NP.  The CCM team was consulted for assistance with disease management and care coordination needs.    Engaged with patient by telephone for initial visit in response to provider referral for pharmacy case management and/or care coordination services.   Consent to Services:  The patient was given information about Chronic Care Management services, agreed to services, and gave verbal consent prior to initiation of services.  Please see initial visit note for detailed documentation.   Patient Care Team: Danielle Bouche NP as PCP - General (Nurse Practitioner) Danielle Reedy MD as Consulting Physician (Cardiology) Danielle Artist MD as Consulting Physician (Gastroenterology) Danielle Ruff MD as Consulting Physician (General Surgery) Danielle Rudd MD as Consulting Physician (Radiation Oncology) [name](Pharmacist)   Objective:  Lab Results  Component Value Date   CREATININE 0.79 09/25/2020   CREATININE 0.51 04/25/2019   CREATININE 0.79 06/26/2018    Lab Results  Component Value Date   HGBA1C 7.7 (A) 12/25/2020   Last diabetic Eye exam:  Lab Results   Component Value Date/Time   HMDIABEYEEXA Retinopathy (A) 09/29/2020 12:00 AM    Last diabetic Foot exam:  Lab Results  Component Value Date/Time   HMDIABFOOTEX normal exam 04/05/2015 12:00 AM        Component Value Date/Time   CHOL 137 09/25/2020 0000   TRIG 364 (H) 09/25/2020 0000   HDL 34 (L) 09/25/2020 0000   CHOLHDL 4.0 09/25/2020 0000   VLDL 38.4 06/03/2017 0955   LDLCALC 59 09/25/2020 0000   LDLDIRECT 63.0 11/19/2012 1659    Hepatic Function Latest Ref Rng & Units 09/25/2020 06/26/2018 11/27/2017  Total Protein 6.1 - 8.1 g/dL 6.4 6.3(L) 6.9  Albumin 3.5 - 5.0 g/dL - 4.2 3.7  AST 10 - 35 U/L 18 12(L) 23  ALT 6 - 29 U/L _0 Alk Phosphatase 38 - 126 U/L - 41 47  Total Bilirubin 0.2 - 1.2 mg/dL 0.5 0.4 0.6  Bilirubin, Direct 0.0 - 0.3 mg/dL - - -    Lab Results  Component Value Date/Time   TSH 0.489 04/22/2016 11:09 AM   TSH 0.55 04/12/2016 02:00 PM   TSH 0.588 02/22/2012 02:40 PM    CBC Latest Ref Rng & Units 09/25/2020 04/25/2019 06/26/2018  WBC 3.8 - 10.8 Thousand/uL 5.3 5.9 6.0  Hemoglobin 11.7 - 15.5 g/dL 14.4 12.9 12.8  Hematocrit 35.0 - 45.0 % 41.8 36.6 37.3  Platelets 140 - 400 Thousand/uL 159 137(L) 161    Lab Results  Component Value Date/Time   VD25OH 62 08/10/2019 02:56 PM   VD25OH 21.1 (L) 06/26/2018 01:14 PM   VD25OH 11.8 (L) 04/22/2016 11:09 AM     Social History  Tobacco Use  Smoking Status Never  Smokeless Tobacco Never   BP Readings from Last 3 Encounters:  12/25/20 111/67  09/25/20 109/71  08/14/20 122/66   Pulse Readings from Last 3 Encounters:  12/25/20 82  09/25/20 91  08/14/20 75   Wt Readings from Last 3 Encounters:  12/25/20 123 lb 11.2 oz (56.1 kg)  09/25/20 125 lb 6.4 oz (56.9 kg)  08/14/20 126 lb 11.2 oz (57.5 kg)    Assessment: Review of patient past medical history, allergies, medications, health status, including review of consultants reports, laboratory and other test data, was performed as part of  comprehensive evaluation and provision of chronic care management services.   SDOH:  (Social Determinants of Health) assessments and interventions performed:    CCM Care Plan  Allergies  Allergen Reactions   Januvia [Sitagliptin] Diarrhea   Metformin And Related Diarrhea    Medications Reviewed Today     Reviewed by [name], CMA (Certified Medical Assistant) on 12/25/20 at 1352  Med List Status: <None>   Medication Order Taking? Sig Documenting Provider Last Dose Status Informant  amitriptyline (ELAVIL) 50 MG tablet [id number] Yes Take 1 tablet (50 mg total) by mouth at bedtime. Danielle Bouche, NP Taking Active   ARIPiprazole (ABILIFY) 10 MG tablet [id number] Yes Take 1 tablet (10 mg total) by mouth daily. Danielle Bouche, NP Taking Active   aspirin 81 MG tablet [id number] Yes Take 81 mg by mouth at bedtime.  [provider] Taking Active Self  atorvastatin (LIPITOR) 10 MG tablet [id number] Yes Take 1 tablet (10 mg total) by mouth daily. Danielle Bouche, NP Taking Active   Blood Glucose Monitoring Suppl (TRUE METRIX METER) w/Device KIT [id number] Yes USE AS DIRECTED Shelda Pal, DO Taking Active   Continuous Blood Gluc Sensor (DEXCOM G6 SENSOR) MISC [id number] Yes Apply 1 sensor to the back of the upper arm. Sensors good for 10 days. After 10 days, remove sensor and place a new one. Alternate arms for sensor placement. Danielle Bouche, NP Taking Active   empagliflozin (JARDIANCE) 25 MG TABS tablet [id number] Yes Take 1 tablet (25 mg total) by mouth daily before breakfast. Danielle Bouche, NP Taking Active   fluconazole (DIFLUCAN) 150 MG tablet [id number] Yes Take 1 tablet on day 1. Take the second tablet 3 days later. Danielle Bouche, NP Taking Active   glucose blood (TRUE METRIX BLOOD GLUCOSE TEST) test strip [id number] Yes TEST BLOOD SUGAR ONE TIME DAILY Danielle Bouche, NP Taking Active   metoprolol tartrate (LOPRESSOR) 25 MG tablet [id number] Yes Take 1 tablet (25 mg total) by  mouth 2 (two) times daily. TAKE 1 TABLET(25 MG) BY MOUTH TWICE DAILY Danielle Bouche, NP Taking Active   Semaglutide (RYBELSUS) 14 MG TABS [id number] Yes Take 14 mg by mouth daily. Danielle Bouche, NP Taking Active   traMADol (ULTRAM) 50 MG tablet [id number] Yes TAKE 1 TABLET(50 MG) BY MOUTH EVERY 8 HOURS AS NEEDED Danielle Bouche, NP Taking Active   TRUEplus Lancets 33G MISC [id number] Yes USE DAILY TO CHECK BLOOD SUGAR. Danielle Bouche, NP Taking Active   Vitamin D, Ergocalciferol, (DRISDOL) 1.25 MG (50000 UNIT) CAPS capsule [id number] Yes Take 1 capsule (50,000 Units total) by mouth every 7 (seven) days. Danielle Bouche, NP Taking Active             Patient Active Problem List   Diagnosis Date Noted   Chronic right SI joint pain 04/21/2019   Anxiety with depression 09/04/2018   Insomnia 12/25/2017   Acute cystitis without  hematuria 10/09/2017   Coronary artery disease involving coronary bypass graft of native heart without angina pectoris 10/09/2017   Type 2 diabetes mellitus with complication, with long-term current use of insulin ([place]) 10/09/2017   Brain tumor ([place]) 10/01/2017   Urinary urgency 05/29/2017   Dysuria 05/29/2017   Vitamin D deficiency 05/07/2016   Normocytic anemia 05/07/2016   Hypoglycemia associated with type 2 diabetes mellitus ([place]) 10/05/2014   Rectal cancer ([place]) 03/31/2014   Diabetic retinopathy ([place]) 11/19/2012   S/P CABG (coronary artery bypass graft)    Thrombocytopenia (HCC)    Obesity (BMI 30-39.9)    History of depression    Coronary atherosclerosis     Paroxysmal SVT (ANVRT)    Type 2 diabetes mellitus with vascular disease ([place])    Hyperlipidemia    Hypertensive heart disease     Immunization History  Administered Date(s) Administered   Fluad Quad(high Dose 65+) 11/03/2018, 11/08/2019, 12/25/2020   Influenza Split 12/23/2011   Influenza Whole 11/14/2012   Influenza, High Dose Seasonal PF 10/13/2015, 11/27/2017   Influenza,inj,Quad PF,6+ Mos 12/02/2013    Influenza-Unspecified 12/26/2014, 11/19/2016, 11/03/2018   Pneumococcal Conjugate-13 10/13/2015   Pneumococcal Polysaccharide-23 02/05/2017   Tdap 02/05/2018   Zoster Recombinat (Shingrix) 06/27/2012    Conditions to be addressed/monitored: HTN, HLD, and DMII  There are no care plans that you recently modified to display for this patient.   Medication Assistance: None required.  Patient affirms current coverage meets needs.  Patient's preferred pharmacy is:  [facility] [place], [place] [address] Phone: [phone] Fax: [phone]  [facility] [place], [place] - [address] AT [place] [address] Phone: [phone] Fax: [phone]  Uses pill box? Yes Pt endorses 60% compliance  Follow Up:  Patient agrees to Care Plan and Follow-up.  Plan: Face to Face appointment with care management team member scheduled for: 2-4 weeks  Larinda Buttery, PharmD Clinical Pharmacist [facility] [phone]

## 2021-01-03 DIAGNOSIS — E785 Hyperlipidemia, unspecified: Secondary | ICD-10-CM

## 2021-01-03 DIAGNOSIS — E1169 Type 2 diabetes mellitus with other specified complication: Secondary | ICD-10-CM

## 2021-01-03 DIAGNOSIS — I10 Essential (primary) hypertension: Secondary | ICD-10-CM

## 2021-01-03 DIAGNOSIS — Z7984 Long term (current) use of oral hypoglycemic drugs: Secondary | ICD-10-CM

## 2021-01-03 NOTE — Patient Instructions (Signed)
Visit Information   Thank you for taking time to visit with me today. Please don't hesitate to contact me if I can be of assistance to you before our next scheduled telephone appointment.  Following are the goals we discussed today:   Patient Goals/Self-Care Activities Over the next 30 days, patient will:  take medications as prescribed and focus on medication adherence by bringing pill box, medication bottles, and son to next appointment to review medication routine  Follow Up Plan: Face to Face appointment with care management team member scheduled for: 1 month   Please call the care guide team at [phone] if you need to cancel or reschedule your appointment.    Following is a copy of your full care plan:  Care Plan : Medication Management  Updates made by Darius Bump, [place] since 01/03/2021 12:00 AM     Problem: DM, HTN, HLD   Note:   Current Barriers:  Unable to self administer medications as prescribed Does not adhere to prescribed medication regimen  Pharmacist Clinical Goal(s):  Over the next 30 days, patient will adhere to plan to optimize therapeutic regimen for chronic conditions as evidenced by report of adherence to recommended medication management changes achieve ability to self administer medications as prescribed through use of son's help & pill box organization as evidenced by patient report through collaboration with PharmD and provider.   Interventions: 1:1 collaboration with Samuel Bouche, NP regarding development and update of comprehensive plan of care as evidenced by provider attestation and co-signature Inter-disciplinary care team collaboration (see longitudinal plan of care) Comprehensive medication review performed; medication list updated in electronic medical record  Diabetes:  Uncontrolled; current treatment:jardiance 67m daily, rybelsus 135mdaily (though questionable adherence to medications) ; a1c 7.7  Current glucose readings: not currently  checking  Denies hypoglycemic/hyperglycemic symptoms  Current meal patterns: breakfast: cereal; lunch: sandwich; dinner: frozen dinners; snacks: "eats constantly", PB sandwich, more cereal, can of soup, ; drinks: crystal light tea, coke, dr pepper  Current exercise: ambulates throughout home  Educated on which medications serve each purpose, matching to disease states and helping patient understand importance as it relates to short & long term sequelae of uncontrolled diabetes Recommended patient continue current regimen, but will benefit from further evaluation of pill box organization & routine w/ son's help, as well as can provide sample of Libre 2 (& loaner reader) at next appt,  Hypertension:  Controlled; current treatment:metoprolol 2542mID (hx SVT);   Current home readings: not currently checking  Denies hypotensive/hypertensive symptoms  Recommended consider reduce to 12.5mg37mD versus trial of discontinuation, question ongoing need for this medication.   Hyperlipidemia:  Controlled; current treatment:atorvastatin 10mg1mly; LDL 59  Recommended continue current regimen  Patient Goals/Self-Care Activities Over the next 30 days, patient will:  take medications as prescribed and focus on medication adherence by bringing pill box, medication bottles, and son to next appointment to review medication routine  Follow Up Plan: Face to Face appointment with care management team member scheduled for: 1 month      Consent to CCM Services: Ms. DuncaHeywardgiven information about Chronic Care Management services including:  CCM service includes personalized support from designated clinical staff supervised by her physician, including individualized plan of care and coordination with other care providers 24/7 contact phone numbers for assistance for urgent and routine care needs. Service will only be billed when office clinical staff spend 20 minutes or more in a month to coordinate care. Only  one practitioner  may furnish and bill the service in a calendar month. The patient may stop CCM services at any time (effective at the end of the month) by phone call to the office staff. The patient will be responsible for cost sharing (co-pay) of up to 20% of the service fee (after annual deductible is met).  Patient agreed to services and verbal consent obtained.   Patient verbalizes understanding of instructions provided today and agrees to view in [place].   Face to Face appointment with care management team member scheduled for:  1 month

## 2021-01-08 ENCOUNTER — Other Ambulatory Visit: Payer: Self-pay

## 2021-01-08 DIAGNOSIS — G4700 Insomnia, unspecified: Secondary | ICD-10-CM

## 2021-01-08 MED ORDER — AMITRIPTYLINE HCL 50 MG PO TABS: 50.0000 mg | ORAL_TABLET | Freq: Every day | ORAL | 0 refills | Status: DC

## 2021-01-10 ENCOUNTER — Ambulatory Visit: Payer: Medicare HMO | Admitting: Pharmacist

## 2021-01-10 DIAGNOSIS — E1159 Type 2 diabetes mellitus with other circulatory complications: Secondary | ICD-10-CM

## 2021-01-10 DIAGNOSIS — E785 Hyperlipidemia, unspecified: Secondary | ICD-10-CM

## 2021-01-10 DIAGNOSIS — G4700 Insomnia, unspecified: Secondary | ICD-10-CM

## 2021-01-10 DIAGNOSIS — I119 Hypertensive heart disease without heart failure: Secondary | ICD-10-CM

## 2021-01-10 DIAGNOSIS — F418 Other specified anxiety disorders: Secondary | ICD-10-CM

## 2021-01-10 MED ORDER — FREESTYLE LIBRE 2 SENSOR MISC: 3 refills | Status: DC

## 2021-01-10 MED ORDER — ARIPIPRAZOLE 10 MG PO TABS: 10.0000 mg | ORAL_TABLET | Freq: Every day | ORAL | 3 refills | Status: AC

## 2021-01-10 MED ORDER — FREESTYLE LIBRE 2 READER DEVI
1.0000 | Freq: Once | 0 refills | Status: AC
Start: 2021-01-10 — End: 2021-01-10

## 2021-01-10 MED ORDER — AMITRIPTYLINE HCL 50 MG PO TABS: 50.0000 mg | ORAL_TABLET | Freq: Every day | ORAL | 3 refills | Status: DC

## 2021-01-10 NOTE — Progress Notes (Signed)
Chronic Care Management Pharmacy Note  01/10/2021 Name:  [name] MRN:  [id number] DOB:  [birth year]  Summary: addressed DM, HTN, HLD. Completed detailed review of medication routine, pill box, and adherence.   Recommendations/Changes made from today's visit:  - vitamin D level check at next PCP visit (patient ran out of vitamin D refills and level from 2021 appears sufficient to consider remaining off of supplementation) - Please send RX refills for amitriptyline & aripiprazole - Please send RX for Freestyle Libre 2 reader device AND sensors  Plan: F/u with pharmacist in 1 month   Subjective: [name] is an 70 y.o. year old female who is a primary patient of Samuel Bouche, NP.  The CCM team was consulted for assistance with disease management and care coordination needs.    Engaged with patient face to face for follow up visit in response to provider referral for pharmacy case management and/or care coordination services.   Consent to Services:  The patient was given information about Chronic Care Management services, agreed to services, and gave verbal consent prior to initiation of services.  Please see initial visit note for detailed documentation.   Patient Care Team: Samuel Bouche, NP as PCP - General (Nurse Practitioner) Jacolyn Reedy, MD as Consulting Physician (Cardiology) Ladene Artist, MD as Consulting Physician (Gastroenterology) [name], MD as Consulting Physician (General Surgery) Kyung Rudd, MD as Consulting Physician (Radiation Oncology) Darius Bump, [facility] (Pharmacist)   Objective:  Lab Results  Component Value Date   CREATININE 0.79 09/25/2020   CREATININE 0.51 04/25/2019   CREATININE 0.79 06/26/2018    Lab Results  Component Value Date   HGBA1C 7.7 (A) 12/25/2020   Last diabetic Eye exam:  Lab Results  Component Value Date/Time   HMDIABEYEEXA Retinopathy (A) 09/29/2020 12:00 AM    Last diabetic Foot exam:  Lab Results   Component Value Date/Time   HMDIABFOOTEX normal exam 04/05/2015 12:00 AM        Component Value Date/Time   CHOL 137 09/25/2020 0000   TRIG 364 (H) 09/25/2020 0000   HDL 34 (L) 09/25/2020 0000   CHOLHDL 4.0 09/25/2020 0000   VLDL 38.4 06/03/2017 0955   LDLCALC 59 09/25/2020 0000   LDLDIRECT 63.0 11/19/2012 1659    Hepatic Function Latest Ref Rng & Units 09/25/2020 06/26/2018 11/27/2017  Total Protein 6.1 - 8.1 g/dL 6.4 6.3(L) 6.9  Albumin 3.5 - 5.0 g/dL - 4.2 3.7  AST 10 - 35 U/L 18 12(L) 23  ALT 6 - 29 U/L '18 14 26  ' Alk Phosphatase 38 - 126 U/L - 41 47  Total Bilirubin 0.2 - 1.2 mg/dL 0.5 0.4 0.6  Bilirubin, Direct 0.0 - 0.3 mg/dL - - -    Lab Results  Component Value Date/Time   TSH 0.489 04/22/2016 11:09 AM   TSH 0.55 04/12/2016 02:00 PM   TSH 0.588 02/22/2012 02:40 PM    CBC Latest Ref Rng & Units 09/25/2020 04/25/2019 06/26/2018  WBC 3.8 - 10.8 Thousand/uL 5.3 5.9 6.0  Hemoglobin 11.7 - 15.5 g/dL 14.4 12.9 12.8  Hematocrit 35.0 - 45.0 % 41.8 36.6 37.3  Platelets 140 - 400 Thousand/uL 159 137(L) 161    Lab Results  Component Value Date/Time   VD25OH 62 08/10/2019 02:56 PM   VD25OH 21.1 (L) 06/26/2018 01:14 PM   VD25OH 11.8 (L) 04/22/2016 11:09 AM     Social History   Tobacco Use  Smoking Status Never  Smokeless Tobacco Never   BP Readings from  Last 3 Encounters:  12/25/20 111/67  09/25/20 109/71  08/14/20 122/66   Pulse Readings from Last 3 Encounters:  12/25/20 82  09/25/20 91  08/14/20 75   Wt Readings from Last 3 Encounters:  12/25/20 123 lb 11.2 oz (56.1 kg)  09/25/20 125 lb 6.4 oz (56.9 kg)  08/14/20 126 lb 11.2 oz (57.5 kg)    Assessment: Review of patient past medical history, allergies, medications, health status, including review of consultants reports, laboratory and other test data, was performed as part of comprehensive evaluation and provision of chronic care management services.   SDOH:  (Social Determinants of Health) assessments  and interventions performed:    CCM Care Plan  Allergies  Allergen Reactions   Januvia [Sitagliptin] Diarrhea   Metformin And Related Diarrhea    Medications Reviewed Today     Reviewed by Darius Bump, [facility] (Pharmacist) on 01/10/21 at 1119  Med List Status: <None>   Medication Order Taking? Sig Documenting Provider Last Dose Status Informant  amitriptyline (ELAVIL) 50 MG tablet [id number] Yes Take 1 tablet (50 mg total) by mouth at bedtime. Samuel Bouche, NP Taking Active   ARIPiprazole (ABILIFY) 10 MG tablet [id number] Yes Take 1 tablet (10 mg total) by mouth daily. Samuel Bouche, NP Taking Active   aspirin 81 MG tablet [id number] Yes Take 81 mg by mouth at bedtime.  [provider] Taking Active Self  atorvastatin (LIPITOR) 10 MG tablet [id number] Yes Take 1 tablet (10 mg total) by mouth daily. Samuel Bouche, NP Taking Active   blood glucose meter kit and supplies KIT [id number] Yes Dispense based on patient and insurance preference. Use up to four times daily as directed. Please include lancets, test strips, control solution. Samuel Bouche, NP Taking Active   Blood Glucose Monitoring Suppl (TRUE METRIX METER) w/Device KIT [id number] Yes USE AS DIRECTED Shelda Pal, DO Taking Active   empagliflozin (JARDIANCE) 25 MG TABS tablet [id number] Yes Take 1 tablet (25 mg total) by mouth daily before breakfast. Samuel Bouche, NP Taking Active   glucose blood (TRUE METRIX BLOOD GLUCOSE TEST) test strip [id number] Yes TEST BLOOD SUGAR ONE TIME DAILY Samuel Bouche, NP Taking Active   metoprolol tartrate (LOPRESSOR) 25 MG tablet [id number] Yes Take 1 tablet (25 mg total) by mouth 2 (two) times daily. TAKE 1 TABLET(25 MG) BY MOUTH TWICE DAILY Samuel Bouche, NP Taking Active   Semaglutide (RYBELSUS) 14 MG TABS [id number] Yes Take 14 mg by mouth daily. Samuel Bouche, NP Taking Active   traMADol (ULTRAM) 50 MG tablet [id number] No TAKE 1 TABLET(50 MG) BY MOUTH EVERY 8 HOURS AS NEEDED  Patient not taking:  Reported on 01/02/2021   Samuel Bouche, NP Not Taking Active   TRUEplus Lancets 33G MISC [id number] Yes USE DAILY TO CHECK BLOOD SUGAR. Samuel Bouche, NP Taking Active   Vitamin D, Ergocalciferol, (DRISDOL) 1.25 MG (50000 UNIT) CAPS capsule [id number] No Take 1 capsule (50,000 Units total) by mouth every 7 (seven) days.  Patient not taking: Reported on 01/02/2021   Samuel Bouche, NP Not Taking Active             Patient Active Problem List   Diagnosis Date Noted   Chronic right SI joint pain 04/21/2019   Anxiety with depression 09/04/2018   Insomnia 12/25/2017   Acute cystitis without hematuria 10/09/2017   Coronary artery disease involving coronary bypass graft of native heart without angina pectoris 10/09/2017   Type 2 diabetes mellitus with complication, with long-term current use of insulin ([place])  10/09/2017   Brain tumor ([place]) 10/01/2017   Urinary urgency 05/29/2017   Dysuria 05/29/2017   Vitamin D deficiency 05/07/2016   Normocytic anemia 05/07/2016   Hypoglycemia associated with type 2 diabetes mellitus ([place]) 10/05/2014   Rectal cancer ([place]) 03/31/2014   Diabetic retinopathy ([place]) 11/19/2012   S/P CABG (coronary artery bypass graft)    Thrombocytopenia (HCC)    Obesity (BMI 30-39.9)    History of depression    Coronary atherosclerosis     Paroxysmal SVT (ANVRT)    Type 2 diabetes mellitus with vascular disease ([place])    Hyperlipidemia    Hypertensive heart disease     Immunization History  Administered Date(s) Administered   Fluad Quad(high Dose 65+) 11/03/2018, 11/08/2019, 12/25/2020   Influenza Split 12/23/2011   Influenza Whole 11/14/2012   Influenza, High Dose Seasonal PF 10/13/2015, 11/27/2017   Influenza,inj,Quad PF,6+ Mos 12/02/2013   Influenza-Unspecified 12/26/2014, 11/19/2016, 11/03/2018   Pneumococcal Conjugate-13 10/13/2015   Pneumococcal Polysaccharide-23 02/05/2017   Tdap 02/05/2018   Zoster Recombinat (Shingrix) 06/27/2012    Conditions to be  addressed/monitored: HTN, HLD, and DMII  Care Plan : Medication Management  Updates made by Darius Bump, [place] since 01/10/2021 12:00 AM     Problem: DM, HTN, HLD   Note:   Current Barriers:  Unable to self administer medications as prescribed Does not adhere to prescribed medication regimen  Pharmacist Clinical Goal(s):  Over the next 30 days, patient will adhere to plan to optimize therapeutic regimen for chronic conditions as evidenced by report of adherence to recommended medication management changes achieve ability to self administer medications as prescribed through use of son's help & pill box organization as evidenced by patient report through collaboration with PharmD and provider.   Interventions: 1:1 collaboration with Samuel Bouche, NP regarding development and update of comprehensive plan of care as evidenced by provider attestation and co-signature Inter-disciplinary care team collaboration (see longitudinal plan of care) Comprehensive medication review performed; medication list updated in electronic medical record  Diabetes:  Uncontrolled; current treatment:jardiance 59m daily, rybelsus 191mdaily; a1c 7.7  Current glucose readings: not currently checking  Denies hypoglycemic/hyperglycemic symptoms  Current meal patterns: breakfast: cereal; lunch: sandwich; dinner: frozen dinners; snacks: "eats constantly", PB sandwich, more cereal, can of soup, ; drinks: crystal light tea, coke, dr pepper  Current exercise: ambulates throughout home  Counseled on proper administration of rybelsus (30 min prior to meal, with ~8oz of water) Recommended patient continue current regimen, provided sample of [place] (& loaner reader), counseled on administration technique and reader device training,  Hypertension:  Controlled; current treatment:metoprolol 2521mID (hx SVT);   Current home readings: not currently checking  Denies hypotensive/hypertensive symptoms  Recommended consider  reduce to 12.5mg41mD versus trial of discontinuation in future, question ongoing need for this medication.   Hyperlipidemia:  Controlled; current treatment:atorvastatin 10mg7mly; LDL 59  Recommended continue current regimen  Patient Goals/Self-Care Activities Over the next 30 days, patient will:  take medications as prescribed and scan CGM 3-6 times per day for ongoing glucose insights  Follow Up Plan: Face to Face appointment with care management team member scheduled for: 1 month       Medication Assistance: None required.  Patient affirms current coverage meets needs.  [facility] [address] [id number]: [phone]  AT [facility] [address] [place] [address] Phone: [phone] Fax: [phone]  Uses pill box? Yes Pt endorses 60% compliance  Follow Up:  Patient agrees to Care Plan and Follow-up.  Plan: Face to Face appointment with care management team member scheduled for: 2-4 weeks  Larinda Buttery, PharmD Clinical Pharmacist [facility] [phone]

## 2021-01-10 NOTE — Patient Instructions (Signed)
Visit Information  Thank you for taking time to visit with me today. Please don't hesitate to contact me if I can be of assistance to you before our next scheduled telephone appointment.  Following are the goals we discussed today:   Patient Goals/Self-Care Activities Over the next 30 days, patient will:  take medications as prescribed and scan CGM 3-6 times per day for ongoing glucose insights  Follow Up Plan: Face to Face appointment with care management team member scheduled for: 1 month   Please call the care guide team at [phone] if you need to cancel or reschedule your appointment.   Patient verbalizes understanding of instructions provided today and agrees to view in [place].   [name]

## 2021-01-10 NOTE — Addendum Note (Signed)
Addended bySamuel Bouche on: 01/10/2021 06:08 PM   Modules accepted: Orders

## 2021-01-17 ENCOUNTER — Other Ambulatory Visit: Payer: Self-pay

## 2021-01-17 DIAGNOSIS — E1159 Type 2 diabetes mellitus with other circulatory complications: Secondary | ICD-10-CM

## 2021-01-17 DIAGNOSIS — E118 Type 2 diabetes mellitus with unspecified complications: Secondary | ICD-10-CM

## 2021-01-17 DIAGNOSIS — Z794 Long term (current) use of insulin: Secondary | ICD-10-CM

## 2021-01-17 MED ORDER — FREESTYLE LIBRE 2 READER DEVI: 0 refills | Status: DC

## 2021-01-22 ENCOUNTER — Other Ambulatory Visit: Payer: Self-pay

## 2021-01-22 DIAGNOSIS — E118 Type 2 diabetes mellitus with unspecified complications: Secondary | ICD-10-CM

## 2021-01-22 DIAGNOSIS — E1159 Type 2 diabetes mellitus with other circulatory complications: Secondary | ICD-10-CM

## 2021-01-22 MED ORDER — FREESTYLE LIBRE 2 READER DEVI: 0 refills | Status: DC

## 2021-01-30 ENCOUNTER — Telehealth: Payer: Self-pay | Admitting: Medical-Surgical

## 2021-01-30 MED ORDER — FREESTYLE LIBRE 2 SENSOR MISC: 3 refills | Status: DC

## 2021-01-30 NOTE — Telephone Encounter (Signed)
Apologies about the error with the pharmacy routing.  I have since removed [facility] from the pharmacy list - Joy or CMA support staff, can you please resend RX for the Sensors to the correct pharmacy ([facility], [place], [place])?  Thank you, Lysle Morales

## 2021-01-30 NOTE — Telephone Encounter (Signed)
Patient's son called and stated that patient came in and saw [facility] & was given a blood sugar device & the device works great. They stated the meter was called in bu tthe sensors never got called in. I noticed that it looks like the sensors were sent to the wrong pharmacy and patient's son stated that Patient has never used [facility] and wanted this pharmacy taking out.   The correct pharmacy to send the sensors to:  [facility], [place] [address] Phone:  [phone]  Fax:  [phone]

## 2021-02-09 ENCOUNTER — Telehealth: Payer: Self-pay | Admitting: Medical-Surgical

## 2021-02-09 NOTE — Telephone Encounter (Signed)
Patient called and stated she can not make her appointment on 02/14/2021 with Lysle Morales due to ride issues, she does not have a ride. She stated she only has a ride on Monday's but I let patient know Lysle Morales is only in office on Wednesday's. She stated she doesn't have a way on Wednesday's. Routing to [place] to further advise.

## 2021-02-10 ENCOUNTER — Telehealth: Payer: Self-pay

## 2021-02-10 NOTE — Telephone Encounter (Signed)
Medication: Continuous Blood Gluc Receiver (FREESTYLE LIBRE 2 READER) DEVI Prior authorization submitted via CoverMyMeds on 02/10/2021 PA submission pending

## 2021-02-10 NOTE — Telephone Encounter (Signed)
Medication: Available without authorization. Prior authorization determination received "Available without authorization"  Patient aware via: [place] aware: Yes Provider aware via this encounter

## 2021-02-10 NOTE — Telephone Encounter (Signed)
Medication: Semaglutide (RYBELSUS) 14 MG TABS Prior authorization submitted via CoverMyMeds on 02/10/2021 PA submission pending

## 2021-02-12 NOTE — Telephone Encounter (Signed)
Medication: Continuous Blood Gluc Receiver (FREESTYLE LIBRE 2 READER) DEVI Prior authorization determination received Medication has been denied Reason for denial:  "No notification sent/No se envi notification"  Called and was transferred to [place] to file an appeal. They said they needed a copy of the OV notes as well as a Rx faxed to them for the appeal. She said the Rx would be faxed to [place] because they need it on a specific form, but I could go ahead and fax over a copy of the Rx that was sent to the pharmacy along with the OV notes. All of this info printed and faxed to [phone] and a fax confirmation was received. Will wait on the fax from them for completion on their form.   [name] informed of this and a copy of the OV note was given to her in case the Rx on the [place] form came through her inbox instead of mine.

## 2021-02-13 NOTE — Chronic Care Management (AMB) (Signed)
°  Care Management   Note  02/13/2021 Name: [name] MRN: [id number] DOB: 07-24-1950  [name] is a 71 y.o. year old female who is a primary care patient of Samuel Bouche, NP and is actively engaged with the care management team. I reached out to [name] by phone today to assist with re-scheduling a follow up visit with the Pharmacist  Follow up plan: Face to Face appointment with care management team member scheduled for:  02/28/2021  Julian Hy, [place], [place] Management  Direct Dial: [phone]

## 2021-02-14 ENCOUNTER — Ambulatory Visit: Payer: Medicare HMO

## 2021-02-28 ENCOUNTER — Ambulatory Visit (INDEPENDENT_AMBULATORY_CARE_PROVIDER_SITE_OTHER): Payer: Medicare HMO | Admitting: Pharmacist

## 2021-02-28 ENCOUNTER — Other Ambulatory Visit: Payer: Self-pay

## 2021-02-28 DIAGNOSIS — M47818 Spondylosis without myelopathy or radiculopathy, sacral and sacrococcygeal region: Secondary | ICD-10-CM

## 2021-02-28 DIAGNOSIS — E785 Hyperlipidemia, unspecified: Secondary | ICD-10-CM

## 2021-02-28 DIAGNOSIS — I119 Hypertensive heart disease without heart failure: Secondary | ICD-10-CM

## 2021-02-28 DIAGNOSIS — M461 Sacroiliitis, not elsewhere classified: Secondary | ICD-10-CM

## 2021-02-28 DIAGNOSIS — E1159 Type 2 diabetes mellitus with other circulatory complications: Secondary | ICD-10-CM

## 2021-02-28 NOTE — Progress Notes (Signed)
Chronic Care Management Pharmacy Note  03/01/2021 Name:  [name] MRN:  [id number] DOB:  December 15, 1950  Summary: addressed DM, HTN, HLD. Completed detailed review of CGM data and nutrition/eating patterns. Family is loving the CGM reader, but did mention they still want new RX for traditional glucometer to have as a back up - the sensor fell off patient's arm a few days early. Reviewed nutrition & provided counseling/education of easy/convenient options for healthy alternatives.  CGM data from loaner device:  Last 90 days:  average BG 211 12-6am = 210 6a-12noon = 170 12-6pm = 215  CGM data from their purchased device: Last 7 days: avg =232 12-6am = 209 6-12noon = 171 12-6pm = 263 6pm - midnight = 287  Recommendations/Changes made from today's visit:  - vitamin D level check at next PCP visit (patient ran out of vitamin D refills and level from 2021 appears sufficient to consider remaining off of supplementation) -  PCP to please send RX refills for TrueMetrix meter + supplies to [facility] (originally was [facility] but pt's son wants this changed) - PCP to please send RX for short quantity Tramadol PRN (at discretion of PCP) to [facility]  Plan: F/u with pharmacist in 2 months   Subjective: [name] is an 71 y.o. year old female who is a primary patient of Samuel Bouche, NP.  The CCM team was consulted for assistance with disease management and care coordination needs.    Engaged with patient face to face for follow up visit in response to provider referral for pharmacy case management and/or care coordination services.   Consent to Services:  The patient was given information about Chronic Care Management services, agreed to services, and gave verbal consent prior to initiation of services.  Please see initial visit note for detailed documentation.   Patient Care Team: Samuel Bouche, NP as PCP - General (Nurse Practitioner) Jacolyn Reedy, MD as Consulting Physician  (Cardiology) Ladene Artist, MD as Consulting Physician (Gastroenterology) [name], MD as Consulting Physician (General Surgery) Kyung Rudd, MD as Consulting Physician (Radiation Oncology) Darius Bump, [facility] (Pharmacist)   Objective:  Lab Results  Component Value Date   CREATININE 0.79 09/25/2020   CREATININE 0.51 04/25/2019   CREATININE 0.79 06/26/2018    Lab Results  Component Value Date   HGBA1C 7.7 (A) 12/25/2020   Last diabetic Eye exam:  Lab Results  Component Value Date/Time   HMDIABEYEEXA Retinopathy (A) 09/29/2020 12:00 AM    Last diabetic Foot exam:  Lab Results  Component Value Date/Time   HMDIABFOOTEX normal exam 04/05/2015 12:00 AM        Component Value Date/Time   CHOL 137 09/25/2020 0000   TRIG 364 (H) 09/25/2020 0000   HDL 34 (L) 09/25/2020 0000   CHOLHDL 4.0 09/25/2020 0000   VLDL 38.4 06/03/2017 0955   LDLCALC 59 09/25/2020 0000   LDLDIRECT 63.0 11/19/2012 1659    Hepatic Function Latest Ref Rng & Units 09/25/2020 06/26/2018 11/27/2017  Total Protein 6.1 - 8.1 g/dL 6.4 6.3(L) 6.9  Albumin 3.5 - 5.0 g/dL - 4.2 3.7  AST 10 - 35 U/L 18 12(L) 23  ALT 6 - 29 U/L _0 Alk Phosphatase 38 - 126 U/L - 41 47  Total Bilirubin 0.2 - 1.2 mg/dL 0.5 0.4 0.6  Bilirubin, Direct 0.0 - 0.3 mg/dL - - -    Lab Results  Component Value Date/Time   TSH 0.489 04/22/2016 11:09 AM   TSH 0.55 04/12/2016  02:00 PM   TSH 0.588 02/22/2012 02:40 PM    CBC Latest Ref Rng & Units 09/25/2020 04/25/2019 06/26/2018  WBC 3.8 - 10.8 Thousand/uL 5.3 5.9 6.0  Hemoglobin 11.7 - 15.5 g/dL 14.4 12.9 12.8  Hematocrit 35.0 - 45.0 % 41.8 36.6 37.3  Platelets 140 - 400 Thousand/uL 159 137(L) 161    Lab Results  Component Value Date/Time   VD25OH 62 08/10/2019 02:56 PM   VD25OH 21.1 (L) 06/26/2018 01:14 PM   VD25OH 11.8 (L) 04/22/2016 11:09 AM     Social History   Tobacco Use  Smoking Status Never  Smokeless Tobacco Never   BP Readings from Last 3  Encounters:  12/25/20 111/67  09/25/20 109/71  08/14/20 122/66   Pulse Readings from Last 3 Encounters:  12/25/20 82  09/25/20 91  08/14/20 75   Wt Readings from Last 3 Encounters:  12/25/20 123 lb 11.2 oz (56.1 kg)  09/25/20 125 lb 6.4 oz (56.9 kg)  08/14/20 126 lb 11.2 oz (57.5 kg)    Assessment: Review of patient past medical history, allergies, medications, health status, including review of consultants reports, laboratory and other test data, was performed as part of comprehensive evaluation and provision of chronic care management services.   SDOH:  (Social Determinants of Health) assessments and interventions performed:    CCM Care Plan  Allergies  Allergen Reactions   Januvia [Sitagliptin] Diarrhea   Metformin And Related Diarrhea    Medications Reviewed Today     Reviewed by Darius Bump, [facility] (Pharmacist) on 02/28/21 at 1139  Med List Status: <None>   Medication Order Taking? Sig Documenting Provider Last Dose Status Informant  amitriptyline (ELAVIL) 50 MG tablet [id number] Yes Take 1 tablet (50 mg total) by mouth at bedtime. Samuel Bouche, NP Taking Active   ARIPiprazole (ABILIFY) 10 MG tablet [id number] Yes Take 1 tablet (10 mg total) by mouth daily. Samuel Bouche, NP Taking Active   aspirin 81 MG tablet [id number] Yes Take 81 mg by mouth at bedtime.  [provider] Taking Active Self  atorvastatin (LIPITOR) 10 MG tablet [id number] Yes Take 1 tablet (10 mg total) by mouth daily. Samuel Bouche, NP Taking Active   blood glucose meter kit and supplies KIT [id number] Yes Dispense based on patient and insurance preference. Use up to four times daily as directed. Please include lancets, test strips, control solution. Samuel Bouche, NP Taking Active   Blood Glucose Monitoring Suppl (TRUE METRIX METER) w/Device KIT [id number] Yes USE AS DIRECTED Shelda Pal, DO Taking Active   Continuous Blood Gluc Receiver (FREESTYLE LIBRE 2 READER) [place] [id number] Yes USE AS  DIRECTED Samuel Bouche, NP Taking Active   Continuous Blood Gluc Sensor (FREESTYLE LIBRE 2 SENSOR) [place] [id number] Yes Apply sensor to the posterior arm.  Change every 14 days alternating arms. Samuel Bouche, NP Taking Active   empagliflozin (JARDIANCE) 25 MG TABS tablet [id number] Yes Take 1 tablet (25 mg total) by mouth daily before breakfast. Samuel Bouche, NP Taking Active   glucose blood (TRUE METRIX BLOOD GLUCOSE TEST) test strip [id number] Yes TEST BLOOD SUGAR ONE TIME DAILY Samuel Bouche, NP Taking Active   metoprolol tartrate (LOPRESSOR) 25 MG tablet [id number] Yes Take 1 tablet (25 mg total) by mouth 2 (two) times daily. TAKE 1 TABLET(25 MG) BY MOUTH TWICE DAILY Samuel Bouche, NP Taking Active   Semaglutide (RYBELSUS) 14 MG TABS [id number] Yes Take 14 mg by mouth daily. Samuel Bouche, NP Taking Active   traMADol (ULTRAM) 50 MG tablet [id number]  No TAKE 1 TABLET(50 MG) BY MOUTH EVERY 8 HOURS AS NEEDED  Patient not taking: Reported on 01/02/2021   Samuel Bouche, NP Not Taking Active   TRUEplus Lancets 33G MISC [id number] Yes USE DAILY TO CHECK BLOOD SUGAR. Samuel Bouche, NP Taking Active   Vitamin D, Ergocalciferol, (DRISDOL) 1.25 MG (50000 UNIT) CAPS capsule [id number] Yes Take 1 capsule (50,000 Units total) by mouth every 7 (seven) days. Samuel Bouche, NP Taking Active             Patient Active Problem List   Diagnosis Date Noted   Chronic right SI joint pain 04/21/2019   Anxiety with depression 09/04/2018   Insomnia 12/25/2017   Acute cystitis without hematuria 10/09/2017   Coronary artery disease involving coronary bypass graft of native heart without angina pectoris 10/09/2017   Type 2 diabetes mellitus with complication, with long-term current use of insulin ([place]) 10/09/2017   Brain tumor ([place]) 10/01/2017   Urinary urgency 05/29/2017   Dysuria 05/29/2017   Vitamin D deficiency 05/07/2016   Normocytic anemia 05/07/2016   Hypoglycemia associated with type 2 diabetes mellitus ([place]) 10/05/2014    Rectal cancer ([place]) 03/31/2014   Diabetic retinopathy ([place]) 11/19/2012   S/P CABG (coronary artery bypass graft)    Thrombocytopenia (HCC)    Obesity (BMI 30-39.9)    History of depression    Coronary atherosclerosis     Paroxysmal SVT (ANVRT)    Type 2 diabetes mellitus with vascular disease ([place])    Hyperlipidemia    Hypertensive heart disease     Immunization History  Administered Date(s) Administered   Fluad Quad(high Dose 65+) 11/03/2018, 11/08/2019, 12/25/2020   Influenza Split 12/23/2011   Influenza Whole 11/14/2012   Influenza, High Dose Seasonal PF 10/13/2015, 11/27/2017   Influenza,inj,Quad PF,6+ Mos 12/02/2013   Influenza-Unspecified 12/26/2014, 11/19/2016, 11/03/2018   Pneumococcal Conjugate-13 10/13/2015   Pneumococcal Polysaccharide-23 02/05/2017   Tdap 02/05/2018   Zoster Recombinat (Shingrix) 06/27/2012    Conditions to be addressed/monitored: HTN, HLD, and DMII  Care Plan : Medication Management  Updates made by Darius Bump, [place] since 03/01/2021 12:00 AM     Problem: DM, HTN, HLD   Note:   Current Barriers:  Unable to self administer medications as prescribed Does not adhere to prescribed medication regimen  Pharmacist Clinical Goal(s):  Over the next 30 days, patient will adhere to plan to optimize therapeutic regimen for chronic conditions as evidenced by report of adherence to recommended medication management changes achieve ability to self administer medications as prescribed through use of son's help & pill box organization as evidenced by patient report through collaboration with PharmD and provider.   Interventions: 1:1 collaboration with Samuel Bouche, NP regarding development and update of comprehensive plan of care as evidenced by provider attestation and co-signature Inter-disciplinary care team collaboration (see longitudinal plan of care) Comprehensive medication review performed; medication list updated in electronic medical  record  Diabetes:  Uncontrolled; current treatment:jardiance 25mg  daily, rybelsus 14mg  daily; a1c 7.7  Current glucose readings: per CGM CGM data from loaner device:  Last 90 days:  average BG 211 12-6am = 210 6a-12noon = 170 12-6pm = 215  CGM data from their purchased device: Last 7 days: avg =232 12-6am = 209 6-12noon = 171 12-6pm = 263 6pm - midnight = 287  Denies hypoglycemic/hyperglycemic symptoms  Current meal patterns: breakfast: cereal; lunch: sandwich; dinner: frozen dinners; snacks: "eats constantly", PB sandwich, more cereal, can of soup, ; drinks: crystal light tea, coke, dr pepper  Current exercise: ambulates throughout home  Counseled on proper administration of rybelsus (30 min prior to meal, with ~8oz of water) Recommended patient continue current regimen, provided sample of Libre 2 (& loaner reader), counseled on administration technique and reader device training,  Hypertension:  Controlled; current treatment:metoprolol 90m BID (hx SVT);   Current home readings: not currently checking  Denies hypotensive/hypertensive symptoms  Recommended consider reduce to 12.535mBID versus trial of discontinuation in future, question ongoing need for this medication.   Hyperlipidemia:  Controlled; current treatment:atorvastatin 1035maily; LDL 59  Recommended continue current regimen  Patient Goals/Self-Care Activities Over the next 60 days, patient will:  take medications as prescribed and scan CGM 3-6 times per day for ongoing glucose insights  Follow Up Plan: Telephone appointment with care management team member scheduled for: 2 months       Medication Assistance: None required.  Patient affirms current coverage meets needs.  [id number] [facility] [address] [id number]: [phone]: [phone] pill box? Yes Pt endorses 60%  compliance  Follow Up:  Patient agrees to Care Plan and Follow-up.  Plan: Telephone follow up appointment with care management team member scheduled for:  2 months  KeeLarinda ButteryharmD Clinical Pharmacist [phone]

## 2021-03-01 MED ORDER — TRAMADOL HCL 50 MG PO TABS: ORAL_TABLET | ORAL | 0 refills | Status: DC

## 2021-03-01 MED ORDER — TRUE METRIX METER W/DEVICE KIT: 1.0000 | PACK | Freq: Once | 0 refills | Status: AC

## 2021-03-01 NOTE — Addendum Note (Signed)
Addended bySamuel Bouche on: 03/01/2021 01:01 PM   Modules accepted: Orders

## 2021-03-01 NOTE — Patient Instructions (Signed)
Visit Information  Thank you for taking time to visit with me today. Please don't hesitate to contact me if I can be of assistance to you before our next scheduled telephone appointment.  Following are the goals we discussed today:  Patient Goals/Self-Care Activities Over the next 60 days, patient will:  take medications as prescribed and scan CGM 3-6 times per day for ongoing glucose insights  Follow Up Plan: Telephone appointment with care management team member scheduled for: 2 months  Please call the care guide team at [phone] if you need to cancel or reschedule your appointment.   Patient verbalizes understanding of instructions and care plan provided today and agrees to view in [place]. Active MyChart status confirmed with patient.    [name]

## 2021-03-06 DIAGNOSIS — I119 Hypertensive heart disease without heart failure: Secondary | ICD-10-CM | POA: Diagnosis not present

## 2021-03-06 DIAGNOSIS — M47818 Spondylosis without myelopathy or radiculopathy, sacral and sacrococcygeal region: Secondary | ICD-10-CM

## 2021-03-06 DIAGNOSIS — E785 Hyperlipidemia, unspecified: Secondary | ICD-10-CM

## 2021-03-06 DIAGNOSIS — E1159 Type 2 diabetes mellitus with other circulatory complications: Secondary | ICD-10-CM | POA: Diagnosis not present

## 2021-03-08 DIAGNOSIS — F32A Depression, unspecified: Secondary | ICD-10-CM | POA: Diagnosis not present

## 2021-03-08 DIAGNOSIS — R413 Other amnesia: Secondary | ICD-10-CM | POA: Diagnosis not present

## 2021-03-13 ENCOUNTER — Other Ambulatory Visit: Payer: Self-pay | Admitting: Medical-Surgical

## 2021-03-13 DIAGNOSIS — E1159 Type 2 diabetes mellitus with other circulatory complications: Secondary | ICD-10-CM

## 2021-03-22 ENCOUNTER — Other Ambulatory Visit: Payer: Self-pay

## 2021-03-22 MED ORDER — FREESTYLE LIBRE 2 SENSOR MISC: 0 refills | Status: AC

## 2021-03-26 DIAGNOSIS — R413 Other amnesia: Secondary | ICD-10-CM | POA: Diagnosis not present

## 2021-03-26 DIAGNOSIS — Z9889 Other specified postprocedural states: Secondary | ICD-10-CM | POA: Diagnosis not present

## 2021-03-26 DIAGNOSIS — I6781 Acute cerebrovascular insufficiency: Secondary | ICD-10-CM | POA: Diagnosis not present

## 2021-03-26 DIAGNOSIS — G319 Degenerative disease of nervous system, unspecified: Secondary | ICD-10-CM | POA: Diagnosis not present

## 2021-03-27 ENCOUNTER — Ambulatory Visit: Payer: Medicare HMO | Admitting: Medical-Surgical

## 2021-03-29 ENCOUNTER — Telehealth: Payer: Self-pay | Admitting: *Deleted

## 2021-03-29 NOTE — Chronic Care Management (AMB) (Signed)
°  Care Management   Note  03/29/2021 Name: Noela Brothers MRN: [id number] DOB: 09/08/1950  Chesnee Floren is a 71 y.o. year old female who is a primary care patient of Samuel Bouche, NP and is actively engaged with the care management team. I reached out to [name] by phone today to assist with re-scheduling a follow up visit with the Pharmacist  Follow up plan: Unsuccessful telephone outreach attempt made. A HIPAA compliant phone message was left for the patient providing contact information and requesting a return call.   Julian Hy, [place] [facility]  Direct Dial: [phone]

## 2021-04-04 ENCOUNTER — Other Ambulatory Visit: Payer: Self-pay | Admitting: Medical-Surgical

## 2021-04-04 DIAGNOSIS — G4700 Insomnia, unspecified: Secondary | ICD-10-CM

## 2021-04-07 ENCOUNTER — Other Ambulatory Visit: Payer: Self-pay | Admitting: Medical-Surgical

## 2021-04-07 DIAGNOSIS — E1159 Type 2 diabetes mellitus with other circulatory complications: Secondary | ICD-10-CM

## 2021-04-07 DIAGNOSIS — E118 Type 2 diabetes mellitus with unspecified complications: Secondary | ICD-10-CM

## 2021-04-09 ENCOUNTER — Ambulatory Visit (INDEPENDENT_AMBULATORY_CARE_PROVIDER_SITE_OTHER): Payer: Medicare HMO | Admitting: Medical-Surgical

## 2021-04-09 ENCOUNTER — Other Ambulatory Visit: Payer: Self-pay

## 2021-04-09 ENCOUNTER — Encounter: Payer: Self-pay | Admitting: Medical-Surgical

## 2021-04-09 VITALS — BP 108/69 | HR 82 | Resp 20 | Ht 59.0 in | Wt 126.4 lb

## 2021-04-09 DIAGNOSIS — I119 Hypertensive heart disease without heart failure: Secondary | ICD-10-CM

## 2021-04-09 DIAGNOSIS — E559 Vitamin D deficiency, unspecified: Secondary | ICD-10-CM | POA: Diagnosis not present

## 2021-04-09 DIAGNOSIS — E1159 Type 2 diabetes mellitus with other circulatory complications: Secondary | ICD-10-CM | POA: Diagnosis not present

## 2021-04-09 DIAGNOSIS — E785 Hyperlipidemia, unspecified: Secondary | ICD-10-CM

## 2021-04-09 DIAGNOSIS — G4700 Insomnia, unspecified: Secondary | ICD-10-CM

## 2021-04-09 DIAGNOSIS — F418 Other specified anxiety disorders: Secondary | ICD-10-CM | POA: Diagnosis not present

## 2021-04-09 LAB — POCT GLYCOSYLATED HEMOGLOBIN (HGB A1C): HbA1c, POC (controlled diabetic range): 7.6 % — AB (ref 0.0–7.0)

## 2021-04-09 NOTE — Progress Notes (Signed)
?  HPI with pertinent ROS:  ? ?CC: Diabetes follow-up ? ?HPI: ?Danielle 71 year old Hart accompanied by her son presenting today for the following: ? ?Diabetes follow-up-taking Jardiance 25 mg daily and Rybelsus 14 mg daily, tolerating well without side effects.  Checking blood sugars using continuous glucose monitor with average blood sugars in the 212-232 range. Admits to overeating with meals and snacking on sweets out of boredom. Reports she has never had self control with eating. Unfortunately, her current medications do not allow for this and control over her diabetes like Metformin did. She is not interested in restarting Metformin due to diarrhea.  ? ?HTN- Taking Lopressor '25mg'$  twice daily as prescribed, tolerating well without side effects. Denies CP, SOB, palpitations, lower extremity edema, dizziness, headaches, or vision changes. ? ?Mood/insomnia- taking Amitriptyline '50mg'$  nightly, tolerating well without side effects. Feels this is helping with both mood and sleep.  ? ?HLD- taking Lipitor '10mg'$  daily, tolerating well without side effects.  ? ?Vitamin D- due for recheck per pharmacist's recommendation.  ? ?I reviewed the past medical history, family history, social history, surgical history, and allergies today and no changes were needed.  Please see the problem list section below in epic for further details. ? ? ?Physical exam:  ? ?General: Well Developed, well nourished, and in no acute distress.  ?Neuro: Alert and oriented x3, extra-ocular muscles intact, sensation grossly intact.  ?HEENT: Normocephalic, atraumatic, pupils equal round reactive to light, neck supple, no masses, no lymphadenopathy, thyroid nonpalpable.  ?Skin: Warm and dry, no rashes. ?Cardiac: Regular rate and rhythm, no murmurs rubs or gallops, no lower extremity edema.  ?Respiratory: Clear to auscultation bilaterally. Not using accessory muscles, speaking in full sentences. ? ?Impression and Recommendations:   ? ?1. Type 2 diabetes  mellitus with vascular disease ([place]) ?Checking labs. Microalbumin ordered. POCT A1c 7.6%, similar to last check. Continue current medications. Discussed dietary limitation, portion control, and avoidance of snacking on sweets and junk foods.  ?- COMPLETE METABOLIC PANEL WITH GFR ?- Microalbumin / creatinine urine ratio ?- POCT HgB A1C ? ?2. Hyperlipidemia, unspecified hyperlipidemia type ?Checking lipids. Continue Lipitor.  ?- Lipid panel ? ?3. Hypertensive heart disease without heart failure ?Checking labs. Continue Lopressor as prescribed.  ?- CBC with Differential/Platelet ?- COMPLETE METABOLIC PANEL WITH GFR ?- Lipid panel ? ?4. Insomnia, unspecified type ?5. Anxiety with depression ?Stable. Continue Amitriptyline.  ? ?6. Vitamin D deficiency ?Checking Vitamin D.  ?- Vitamin D 1,25 dihydroxy ? ?Return in about 3 months (around 07/10/2021) for DM/HTN/HLD follow up. ?___________________________________________ ?Clearnce Sorrel, DNP, APRN, FNP-BC ?Primary Care and Sports Medicine ?[place] ?

## 2021-04-10 NOTE — Chronic Care Management (AMB) (Signed)
?  Care Management  ? ?Note ? ?04/10/2021 ?Name: [name] MRN: [id number] DOB: 30-Nov-1950 ? ?[name] is a 71 y.o. year old female who is a primary care patient of Samuel Bouche, NP and is actively engaged with the care management team. I reached out to [name] by phone today to assist with re-scheduling a follow up visit with the Pharmacist ? ?Follow up plan: ?Telephone appointment with care management team member scheduled for: 07/26/2021 ? ?Francely Craw, CCMA ?Care Guide, Embedded Care Coordination ?[place]  Care Management  ?Direct Dial: [phone] ? ? ?

## 2021-04-23 ENCOUNTER — Other Ambulatory Visit: Payer: Self-pay

## 2021-04-23 ENCOUNTER — Telehealth: Payer: Medicare HMO

## 2021-04-23 DIAGNOSIS — E1159 Type 2 diabetes mellitus with other circulatory complications: Secondary | ICD-10-CM

## 2021-04-23 MED ORDER — EMPAGLIFLOZIN 25 MG PO TABS: 25.0000 mg | ORAL_TABLET | Freq: Every day | ORAL | 0 refills | Status: AC

## 2021-06-15 ENCOUNTER — Other Ambulatory Visit: Payer: Self-pay | Admitting: Medical-Surgical

## 2021-06-15 DIAGNOSIS — I119 Hypertensive heart disease without heart failure: Secondary | ICD-10-CM

## 2021-06-15 DIAGNOSIS — E785 Hyperlipidemia, unspecified: Secondary | ICD-10-CM

## 2021-06-15 DIAGNOSIS — M47818 Spondylosis without myelopathy or radiculopathy, sacral and sacrococcygeal region: Secondary | ICD-10-CM

## 2021-06-15 DIAGNOSIS — E1159 Type 2 diabetes mellitus with other circulatory complications: Secondary | ICD-10-CM

## 2021-06-29 ENCOUNTER — Encounter: Payer: Self-pay | Admitting: Neurology

## 2021-06-29 NOTE — Telephone Encounter (Signed)
Mychart message sent to patient.

## 2021-07-15 NOTE — Progress Notes (Unsigned)
   Established Patient Office Visit  Subjective   Patient ID: [name], female   DOB: 1950/11/07 Age: 71 y.o. MRN: [id number]   No chief complaint on file.   HPI Pleasant 71 year old female accompanied by her son presenting today for follow-up on:  Diabetes:  Hypertension:  Hyperlipidemia:  Mood/insomnia:  ROS    Objective:    There were no vitals filed for this visit.   Physical Exam   No results found for this or any previous visit (from the past 24 hour(s)).   {Labs (Optional):23779}  The ASCVD Risk score (Arnett DK, et al., 2019) failed to calculate for the following reasons:   The patient has a prior MI or stroke diagnosis   Assessment & Plan:   No problem-specific Assessment & Plan notes found for this encounter.   No follow-ups on file.  ___________________________________________ Thayer Ohm, DNP, APRN, FNP-BC Primary Care and Sports Medicine [facility] [place]

## 2021-07-16 ENCOUNTER — Ambulatory Visit (INDEPENDENT_AMBULATORY_CARE_PROVIDER_SITE_OTHER): Payer: Self-pay | Admitting: Medical-Surgical

## 2021-07-16 DIAGNOSIS — E1159 Type 2 diabetes mellitus with other circulatory complications: Secondary | ICD-10-CM

## 2021-07-16 DIAGNOSIS — F418 Other specified anxiety disorders: Secondary | ICD-10-CM

## 2021-07-16 DIAGNOSIS — I119 Hypertensive heart disease without heart failure: Secondary | ICD-10-CM

## 2021-07-16 DIAGNOSIS — Z91199 Patient's noncompliance with other medical treatment and regimen due to unspecified reason: Secondary | ICD-10-CM

## 2021-07-16 DIAGNOSIS — E785 Hyperlipidemia, unspecified: Secondary | ICD-10-CM

## 2021-07-26 ENCOUNTER — Ambulatory Visit (INDEPENDENT_AMBULATORY_CARE_PROVIDER_SITE_OTHER): Payer: Medicare HMO | Admitting: Pharmacist

## 2021-07-26 DIAGNOSIS — E1159 Type 2 diabetes mellitus with other circulatory complications: Secondary | ICD-10-CM

## 2021-07-26 DIAGNOSIS — I119 Hypertensive heart disease without heart failure: Secondary | ICD-10-CM

## 2021-07-26 DIAGNOSIS — E785 Hyperlipidemia, unspecified: Secondary | ICD-10-CM

## 2021-07-26 NOTE — Progress Notes (Cosign Needed)
Chronic Care Management Pharmacy Note  07/26/2021 Name:  [name] MRN:  [id number] DOB:  [birth year]  Summary: addressed DM, HTN, HLD. Son, [name], usually assists with care for patient, but was unavailable during my call.  Patient describes she quit using CGM because she was bumping it off earlier than the 2 week period and felt it was too costly to not get its full 2 week durations. She is not checking sugars at home using traditional glucometer either. She states her last readings on CGM were ~200s in AM and ~300s in evening. Still eating "too much" per patient's own words.  Reviewed nutrition & provided counseling/education of easy/convenient options for healthy alternatives.  Attempted to also contact [name] via his own phone number, was unsuccessful.   Recommendations/Changes made from today's visit:  - Recommended patient reschedule her PCP visit in order to complete the recommendations below: - vitamin D level check at next PCP visit (patient ran out of vitamin D refills and level from 2021 appears sufficient to consider remaining off of supplementation)   Plan: F/u with pharmacist in 2 months   Subjective: [name] is an 71 y.o. year old female who is a primary patient of Samuel Bouche, NP.  The CCM team was consulted for assistance with disease management and care coordination needs.    Engaged with patient by telephone for follow up visit in response to provider referral for pharmacy case management and/or care coordination services.   Consent to Services:  The patient was given information about Chronic Care Management services, agreed to services, and gave verbal consent prior to initiation of services.  Please see initial visit note for detailed documentation.   Patient Care Team: Samuel Bouche, NP as PCP - General (Nurse Practitioner) Jacolyn Reedy, MD as Consulting Physician (Cardiology) Ladene Artist, MD as Consulting Physician (Gastroenterology) [name], MD as Consulting Physician (General Surgery) Kyung Rudd, MD as Consulting Physician (Radiation Oncology) Darius Bump, [facility] (Pharmacist)   Objective:  Lab Results  Component Value Date   CREATININE 0.79 09/25/2020   CREATININE 0.51 04/25/2019   CREATININE 0.79 06/26/2018    Lab Results  Component Value Date   HGBA1C 7.6 (A) 04/09/2021   Last diabetic Eye exam:  Lab Results  Component Value Date/Time   HMDIABEYEEXA Retinopathy (A) 09/29/2020 12:00 AM    Last diabetic Foot exam:  Lab Results  Component Value Date/Time   HMDIABFOOTEX normal exam 04/05/2015 12:00 AM        Component Value Date/Time   CHOL 137 09/25/2020 0000   TRIG 364 (H) 09/25/2020 0000   HDL 34 (L) 09/25/2020 0000   CHOLHDL 4.0 09/25/2020 0000   VLDL 38.4 06/03/2017 0955   LDLCALC 59 09/25/2020 0000   LDLDIRECT 63.0 11/19/2012 1659       Latest Ref Rng & Units 09/25/2020   12:00 AM 06/26/2018    1:14 PM 11/27/2017    2:26 PM  Hepatic Function  Total Protein 6.1 - 8.1 g/dL 6.4  6.3  6.9   Albumin 3.5 - 5.0 g/dL  4.2  3.7   AST 10 - 35 U/L _0 ALT 6 - 29 U/L _1 Alk Phosphatase 38 - 126 U/L  41  47   Total Bilirubin 0.2 - 1.2 mg/dL 0.5  0.4  0.6     Lab Results  Component Value Date/Time   TSH 0.489 04/22/2016 11:09 AM   TSH 0.55  04/12/2016 02:00 PM   TSH 0.588 02/22/2012 02:40 PM       Latest Ref Rng & Units 09/25/2020   12:00 AM 04/25/2019    3:19 PM 06/26/2018    1:14 PM  CBC  WBC 3.8 - 10.8 Thousand/uL 5.3  5.9  6.0   Hemoglobin 11.7 - 15.5 g/dL 14.4  12.9  12.8   Hematocrit 35.0 - 45.0 % 41.8  36.6  37.3   Platelets 140 - 400 Thousand/uL 159  137  161     Lab Results  Component Value Date/Time   VD25OH 62 08/10/2019 02:56 PM   VD25OH 21.1 (L) 06/26/2018 01:14 PM   VD25OH 11.8 (L) 04/22/2016 11:09 AM     Social History   Tobacco Use  Smoking Status Never  Smokeless Tobacco Never   BP Readings from Last 3 Encounters:  04/09/21 108/69   12/25/20 111/67  09/25/20 109/71   Pulse Readings from Last 3 Encounters:  04/09/21 82  12/25/20 82  09/25/20 91   Wt Readings from Last 3 Encounters:  04/09/21 126 lb 6.4 oz (57.3 kg)  12/25/20 123 lb 11.2 oz (56.1 kg)  09/25/20 125 lb 6.4 oz (56.9 kg)    Assessment: Review of patient past medical history, allergies, medications, health status, including review of consultants reports, laboratory and other test data, was performed as part of comprehensive evaluation and provision of chronic care management services.   SDOH:  (Social Determinants of Health) assessments and interventions performed:    CCM Care Plan  Allergies  Allergen Reactions   Januvia [Sitagliptin] Diarrhea   Metformin And Related Diarrhea    Medications Reviewed Today     Reviewed by Beverlee Nims, CMA (Certified Medical Assistant) on 04/09/21 at 1410  Med List Status: <None>   Medication Order Taking? Sig Documenting Provider Last Dose Status Informant  amitriptyline (ELAVIL) 50 MG tablet [id number] Yes TAKE 1 TABLET(50 MG) BY MOUTH AT BEDTIME Samuel Bouche, NP Taking Active   ARIPiprazole (ABILIFY) 10 MG tablet [id number] Yes Take 1 tablet (10 mg total) by mouth daily. Samuel Bouche, NP Taking Active   aspirin 81 MG tablet [id number] Yes Take 81 mg by mouth at bedtime.  [provider] Taking Active Self  atorvastatin (LIPITOR) 10 MG tablet [id number] Yes Take 1 tablet (10 mg total) by mouth daily. Samuel Bouche, NP Taking Active   blood glucose meter kit and supplies KIT [id number] Yes Dispense based on patient and insurance preference. Use up to four times daily as directed. Please include lancets, test strips, control solution. Samuel Bouche, NP Taking Active   Continuous Blood Gluc Receiver (FREESTYLE LIBRE 2 READER) DEVI [id number] Yes USE AS DIRECTED Samuel Bouche, NP Taking Active   Continuous Blood Gluc Sensor (FREESTYLE LIBRE 2 SENSOR) MISC [id number] Yes Apply sensor to the posterior arm.  Change  every 14 days alternating arms. Samuel Bouche, NP Taking Active   empagliflozin (JARDIANCE) 25 MG TABS tablet [id number] Yes Take 1 tablet (25 mg total) by mouth daily before breakfast. Samuel Bouche, NP Taking Active   glucose blood (TRUE METRIX BLOOD GLUCOSE TEST) test strip [id number] Yes TEST BLOOD SUGAR ONE TIME DAILY Samuel Bouche, NP Taking Active   metoprolol tartrate (LOPRESSOR) 25 MG tablet [id number] Yes Take 1 tablet (25 mg total) by mouth 2 (two) times daily. TAKE 1 TABLET(25 MG) BY MOUTH TWICE DAILY Samuel Bouche, NP Taking Active   RYBELSUS 14 MG TABS [id number] Yes TAKE 1 TABLET BY MOUTH DAILY Samuel Bouche, NP Taking Active  traMADol (ULTRAM) 50 MG tablet [id number] Yes TAKE 1 TABLET(50 MG) BY MOUTH EVERY 8 HOURS AS NEEDED Samuel Bouche, NP Taking Active   TRUEplus Lancets 33G MISC [id number] Yes USE DAILY TO CHECK BLOOD SUGAR. Samuel Bouche, NP Taking Active   Vitamin D, Ergocalciferol, (DRISDOL) 1.25 MG (50000 UNIT) CAPS capsule [id number] Yes Take 1 capsule (50,000 Units total) by mouth every 7 (seven) days. Samuel Bouche, NP Taking Active             Patient Active Problem List   Diagnosis Date Noted   Chronic right SI joint pain 04/21/2019   Anxiety with depression 09/04/2018   Insomnia 12/25/2017   Acute cystitis without hematuria 10/09/2017   Coronary artery disease involving coronary bypass graft of native heart without angina pectoris 10/09/2017   Type 2 diabetes mellitus with complication, with long-term current use of insulin ([place]) 10/09/2017   Brain tumor ([place]) 10/01/2017   Urinary urgency 05/29/2017   Dysuria 05/29/2017   Vitamin D deficiency 05/07/2016   Normocytic anemia 05/07/2016   Hypoglycemia associated with type 2 diabetes mellitus ([place]) 10/05/2014   Rectal cancer ([place]) 03/31/2014   Diabetic retinopathy ([place]) 11/19/2012   S/P CABG (coronary artery bypass graft)    Thrombocytopenia (HCC)    Obesity (BMI 30-39.9)    History of depression    Coronary atherosclerosis      Paroxysmal SVT (ANVRT)    Type 2 diabetes mellitus with vascular disease ([place])    Hyperlipidemia    Hypertensive heart disease     Immunization History  Administered Date(s) Administered   Fluad Quad(high Dose 65+) 11/03/2018, 11/08/2019, 12/25/2020   Influenza Split 12/23/2011   Influenza Whole 11/14/2012   Influenza, High Dose Seasonal PF 10/13/2015, 11/27/2017   Influenza,inj,Quad PF,6+ Mos 12/02/2013   Influenza-Unspecified 12/26/2014, 11/19/2016, 11/03/2018   Pneumococcal Conjugate-13 10/13/2015   Pneumococcal Polysaccharide-23 02/05/2017   Tdap 02/05/2018   Zoster Recombinat (Shingrix) 06/27/2012    Conditions to be addressed/monitored: HTN, HLD, and DMII  Care Plan : Medication Management  Updates made by Darius Bump, [place] since 07/26/2021 12:00 AM     Problem: DM, HTN, HLD   Note:   Current Barriers:  Unable to self administer medications as prescribed Does not adhere to prescribed medication regimen  Pharmacist Clinical Goal(s):  Over the next 30 days, patient will adhere to plan to optimize therapeutic regimen for chronic conditions as evidenced by report of adherence to recommended medication management changes achieve ability to self administer medications as prescribed through use of son's help & pill box organization as evidenced by patient report through collaboration with PharmD and provider.   Interventions: 1:1 collaboration with Samuel Bouche, NP regarding development and update of comprehensive plan of care as evidenced by provider attestation and co-signature Inter-disciplinary care team collaboration (see longitudinal plan of care) Comprehensive medication review performed; medication list updated in electronic medical record  Diabetes:  Uncontrolled; current treatment:jardiance 46m daily, rybelsus 166mdaily; a1c 7.7  Current glucose readings: per CGM CGM data from loaner device:  Last 90 days:  average BG 211 12-6am = 210 6a-12noon =  170 12-6pm = 215  CGM data from their purchased device: Last 7 days: avg =232 12-6am = 209 6-12noon = 171 12-6pm = 263 6pm - midnight = 287  Denies hypoglycemic/hyperglycemic symptoms  Current meal patterns: breakfast: cereal; lunch: sandwich; dinner: frozen dinners; snacks: "eats constantly", PB sandwich, more cereal, can of soup, ; drinks: crystal light tea, coke, dr pepper  Current exercise: ambulates throughout  home  Counseled on proper administration of rybelsus (30 min prior to meal, with ~8oz of water) Recommended patient continue current regimen, provided sample of Libre 2 (& loaner reader), counseled on administration technique and reader device training,  Hypertension:  Controlled; current treatment:metoprolol 25mg  BID (hx SVT);   Current home readings: not currently checking  Denies hypotensive/hypertensive symptoms  Recommended consider reduce to 12.5mg  BID versus trial of discontinuation in future, question ongoing need for this medication.   Hyperlipidemia:  Controlled; current treatment:atorvastatin 10mg  daily; LDL 59  Recommended continue current regimen  Patient Goals/Self-Care Activities Over the next 60 days, patient will:  take medications as prescribed and scan CGM 3-6 times per day for ongoing glucose insights  Follow Up Plan: Telephone appointment with care management team member scheduled for: 2 months    Long-Range Goal: Disease Progression Prevention   Start Date: 01/02/2021  This Visit's Progress: Not on track  Priority: High  Note:   Current Barriers:  Unable to self administer medications as prescribed Does not adhere to prescribed medication regimen  Pharmacist Clinical Goal(s):  Over the next 30 days, patient will adhere to plan to optimize therapeutic regimen for chronic conditions as evidenced by report of adherence to recommended medication management changes achieve ability to self administer medications as prescribed through use of son's  help & pill box organization as evidenced by patient report through collaboration with PharmD and provider.   Interventions: 1:1 collaboration with Samuel Bouche, NP regarding development and update of comprehensive plan of care as evidenced by provider attestation and co-signature Inter-disciplinary care team collaboration (see longitudinal plan of care) Comprehensive medication review performed; medication list updated in electronic medical record  Diabetes:  Uncontrolled; current treatment:jardiance 25mg  daily, rybelsus 14mg  daily; a1c 7.7  Current glucose readings: per CGM CGM data from loaner device:  Last 90 days:  average BG 211 12-6am = 210 6a-12noon = 170 12-6pm = 215  CGM data from their purchased device: Last 7 days: avg =232 12-6am = 209 6-12noon = 171 12-6pm = 263 6pm - midnight = 287  Denies hypoglycemic/hyperglycemic symptoms  Current meal patterns: breakfast: cereal; lunch: sandwich; dinner: frozen dinners; snacks: "eats constantly", PB sandwich, more cereal, can of soup, ; drinks: crystal light tea, coke, dr pepper  Current exercise: ambulates throughout home  Counseled on proper administration of rybelsus (30 min prior to meal, with ~8oz of water) Recommended patient continue current regimen, provided sample of Libre 2 (& loaner reader), counseled on administration technique and reader device training,  Hypertension:  Controlled; current treatment:metoprolol 25mg  BID (hx SVT);   Current home readings: not currently checking  Denies hypotensive/hypertensive symptoms  Recommended consider reduce to 12.5mg  BID versus trial of discontinuation in future, question ongoing need for this medication.   Hyperlipidemia:  Controlled; current treatment:atorvastatin 10mg  daily; LDL 59  Recommended continue current regimen  Patient Goals/Self-Care Activities Over the next 60 days, patient will:  take medications as prescribed and scan CGM 3-6 times per day for ongoing glucose  insights  Follow Up Plan: Telephone appointment with care management team member scheduled for: 2 months        Medication Assistance: None required.  Patient affirms current coverage meets needs.  Patient's preferred pharmacy is:  [facility] [place], [place] - [address] [address] AT [facility] OF [address] [facility] [address] Phone: [phone] Fax: [phone]   Uses pill box? Yes Pt endorses 60% compliance  Follow Up:  Patient agrees  to Care Plan and Follow-up.  Plan: Telephone follow up appointment with care management team member scheduled for:  2 months  Larinda Buttery, PharmD Clinical Pharmacist [facility] [phone]

## 2021-07-26 NOTE — Patient Instructions (Signed)
Visit Information  Thank you for taking time to visit with me today. Please don't hesitate to contact me if I can be of assistance to you before our next scheduled telephone appointment.  Following are the goals we discussed today:  Patient Goals/Self-Care Activities Over the next 60 days, patient will:  take medications as prescribed and check blood sugars at home, and reschedule PCP visit  Follow Up Plan: Telephone appointment with care management team member scheduled for: 2 months  Please call the care guide team at [phone] if you need to cancel or reschedule your appointment.   Patient verbalizes understanding of instructions and care plan provided today and agrees to view in [place]. Active MyChart status and patient understanding of how to access instructions and care plan via MyChart confirmed with patient.     [name]

## 2021-08-03 DIAGNOSIS — E785 Hyperlipidemia, unspecified: Secondary | ICD-10-CM

## 2021-08-03 DIAGNOSIS — I119 Hypertensive heart disease without heart failure: Secondary | ICD-10-CM

## 2021-08-03 DIAGNOSIS — E1159 Type 2 diabetes mellitus with other circulatory complications: Secondary | ICD-10-CM

## 2021-09-18 DIAGNOSIS — Z794 Long term (current) use of insulin: Secondary | ICD-10-CM | POA: Diagnosis not present

## 2021-09-18 DIAGNOSIS — Z8659 Personal history of other mental and behavioral disorders: Secondary | ICD-10-CM | POA: Diagnosis not present

## 2021-09-18 DIAGNOSIS — I25810 Atherosclerosis of coronary artery bypass graft(s) without angina pectoris: Secondary | ICD-10-CM | POA: Diagnosis not present

## 2021-09-18 DIAGNOSIS — E785 Hyperlipidemia, unspecified: Secondary | ICD-10-CM | POA: Diagnosis not present

## 2021-09-18 DIAGNOSIS — E118 Type 2 diabetes mellitus with unspecified complications: Secondary | ICD-10-CM | POA: Diagnosis not present

## 2021-09-25 ENCOUNTER — Telehealth: Payer: Medicare HMO

## 2021-09-26 ENCOUNTER — Encounter: Payer: Self-pay | Admitting: General Practice

## 2021-12-17 ENCOUNTER — Other Ambulatory Visit: Payer: Self-pay | Admitting: Medical-Surgical

## 2021-12-17 DIAGNOSIS — E1159 Type 2 diabetes mellitus with other circulatory complications: Secondary | ICD-10-CM

## 2021-12-17 NOTE — Telephone Encounter (Signed)
Patient stated that she didn't want to make an appointment because she has a new PCP. Tvt

## 2022-04-11 ENCOUNTER — Other Ambulatory Visit: Payer: Self-pay

## 2022-04-11 DIAGNOSIS — G4700 Insomnia, unspecified: Secondary | ICD-10-CM

## 2022-04-11 DIAGNOSIS — E785 Hyperlipidemia, unspecified: Secondary | ICD-10-CM

## 2022-04-11 MED ORDER — ATORVASTATIN CALCIUM 10 MG PO TABS: ORAL_TABLET | ORAL | 0 refills | Status: DC

## 2022-04-11 MED ORDER — AMITRIPTYLINE HCL 50 MG PO TABS: ORAL_TABLET | ORAL | 0 refills | Status: DC

## 2022-04-11 MED ORDER — AMITRIPTYLINE HCL 50 MG PO TABS: ORAL_TABLET | ORAL | 0 refills | Status: AC

## 2022-04-11 MED ORDER — ATORVASTATIN CALCIUM 10 MG PO TABS: ORAL_TABLET | ORAL | 0 refills | Status: AC

## 2022-05-20 ENCOUNTER — Other Ambulatory Visit: Payer: Self-pay | Admitting: Medical-Surgical

## 2022-05-20 DIAGNOSIS — G4700 Insomnia, unspecified: Secondary | ICD-10-CM

## 2022-05-20 DIAGNOSIS — E785 Hyperlipidemia, unspecified: Secondary | ICD-10-CM

## 2022-06-20 ENCOUNTER — Encounter: Payer: Self-pay | Admitting: Gastroenterology

## 2022-12-04 ENCOUNTER — Ambulatory Visit: Payer: Medicare HMO | Admitting: Gastroenterology

## 2023-08-18 ENCOUNTER — Encounter: Payer: Self-pay | Admitting: Gastroenterology

## 2023-09-01 ENCOUNTER — Telehealth: Payer: Self-pay | Admitting: *Deleted

## 2023-09-01 ENCOUNTER — Other Ambulatory Visit: Payer: Self-pay

## 2023-09-01 DIAGNOSIS — Z951 Presence of aortocoronary bypass graft: Secondary | ICD-10-CM

## 2023-09-01 NOTE — Telephone Encounter (Signed)
 Please see note from Norleen Schillings, CRNA-he requires this patient to have a cardiology evaluation prior to her colonoscopy in the [facility].  Chart indicates she had a CABG in 2014 and if she has had regular cardiology follow-up since then, it has not been in recent years and/or not in this health system.  Please contact her and see which can find out about where her cardiologist is so an appointment can be arranged.  If she is unable to see them in about the next 2 weeks, her colonoscopy will need to be rescheduled to a later date.  VEAR Brand MD

## 2023-09-01 NOTE — Telephone Encounter (Signed)
 Spoke with the patient and Sharlisa Hollifield her son and primary care giver. The patient has not seen her cardiologist in many years and cannot recall the name of the doctor. She does see her PCP regularly. Can I send for cardiac clearance to her PCP?

## 2023-09-01 NOTE — Telephone Encounter (Signed)
 Dr. Legrand,  This pt is scheduled with you on 09/23/23.  She has a significant cardiac hx but very little information concerning her condition in the EMR.  We will need a cardiac clearance to clear her for care at [facility].  Thanks,  Norleen EMERSON Schillings

## 2023-09-01 NOTE — Telephone Encounter (Signed)
 Although this woman has had a few colonoscopies in the [facility] since her 2014 CABG, our anesthesia provider is requesting pre-procedure evaluation by Cardiology.  I do not find any Cardiology office notes in this EHR, nor any notes from outside Cardiology practices in Care Everywhere.  So please refer her to [facility] cardiology for Hx CAD and CABG - pre-procedure evaluation.  Colonoscopy therefore needs to be canceled for the upcoming date, to be rescheduled for after clearance.  - H. Danis

## 2023-09-01 NOTE — Telephone Encounter (Signed)
 Procedure canceled. Care giver, Genavieve Mangiapane requests this. Referral to [facility] cardiology.

## 2023-09-09 ENCOUNTER — Encounter

## 2023-09-23 ENCOUNTER — Encounter: Admitting: Gastroenterology

## 2023-11-03 ENCOUNTER — Telehealth: Payer: Self-pay

## 2023-11-03 NOTE — Transitions of Care (Post Inpatient/ED Visit) (Signed)
 11/03/2023  Name: [name] MRN: [id number] DOB: 1950/09/29  Today's TOC FU Call Status: Today's TOC FU Call Status:: Successful TOC FU Call Completed TOC FU Call Complete Date: 11/03/23 Patient's Name and Date of Birth confirmed.  Transition Care Management Follow-up Telephone Call Date of Discharge: 11/01/23 Discharge Facility: Other [facility]) Name of Other (Non-[facility]) Discharge Facility: [facility] Type of Discharge: Inpatient Admission How have you been since you were released from the hospital?: Same Any questions or concerns?: No  Items Reviewed: Did you receive and understand the discharge instructions provided?: Yes Medications obtained,verified, and reconciled?: Yes (Medications Reviewed) Any new allergies since your discharge?: No Dietary orders reviewed?: Yes Do you have support at home?: Yes People in Home [RPT]: child(ren), adult  Medications Reviewed Today: Medications Reviewed Today     Reviewed by Emmitt Pan, LPN (Licensed Practical Nurse) on 11/03/23 at 0930  Med List Status: <None>   Medication Order Taking? Sig Documenting Provider Last Dose Status Informant  amitriptyline  (ELAVIL ) 50 MG tablet [id number] Yes TAKE 1 TABLET(50 MG) BY MOUTH AT BEDTIME. NEEDS APPOINTMENT FOR FURTHER REFILLS. Willo Mini, NP  Active   ARIPiprazole  (ABILIFY ) 10 MG tablet [id number] Yes Take 1 tablet (10 mg total) by mouth daily. Willo Mini, NP  Active   aspirin  81 MG tablet [id number] Yes Take 81 mg by mouth at bedtime.  [provider]  Active Self  atorvastatin  (LIPITOR) 10 MG tablet [id number] Yes TAKE 1 TABLET(10 MG) BY MOUTH DAILY. NEEDS APPOINTMENT FOR FURTHER REFILLS. Willo Mini, NP  Active   blood glucose meter kit and supplies KIT [id number] Yes Dispense based on patient and insurance preference. Use up to four times daily as directed. Please include lancets, test strips, control solution. Willo Mini, NP  Active   Continuous Blood Gluc Receiver (FREESTYLE  LIBRE 2 READER) DEVI [id number] Yes USE AS DIRECTED Willo Mini, NP  Active   Continuous Blood Gluc Sensor (FREESTYLE LIBRE 2 SENSOR) MISC [id number] Yes Apply sensor to the posterior arm.  Change every 14 days alternating arms. Willo Mini, NP  Active   empagliflozin  (JARDIANCE ) 25 MG TABS tablet [id number] Yes Take 1 tablet (25 mg total) by mouth daily before breakfast. Willo Mini, NP  Active   glucose blood (TRUE METRIX BLOOD GLUCOSE TEST) test strip [id number] Yes TEST BLOOD SUGAR ONE TIME DAILY Willo Mini, NP  Active   metoprolol  tartrate (LOPRESSOR ) 25 MG tablet [id number] Yes TAKE 1 TABLET(25 MG) BY MOUTH TWICE DAILY Willo Mini, NP  Active   Semaglutide  (RYBELSUS ) 14 MG TABS [id number] Yes TAKE 1 TABLET BY MOUTH DAILY Jessup, Joy, NP  Active   traMADol  (ULTRAM ) 50 MG tablet [id number] Yes TAKE 1 TABLET(50 MG) BY MOUTH EVERY 8 HOURS AS NEEDED Willo Mini, NP  Active   TRUEplus Lancets 33G MISC [id number] Yes USE DAILY TO CHECK BLOOD SUGAR. Willo Mini, NP  Active   Vitamin D , Ergocalciferol , (DRISDOL ) 1.25 MG (50000 UNIT) CAPS capsule [id number] Yes Take 1 capsule (50,000 Units total) by mouth every 7 (seven) days. Willo Mini, NP  Active             Home Care and Equipment/Supplies: Were Home Health Services Ordered?: NA Any new equipment or medical supplies ordered?: NA  Functional Questionnaire: Do you need assistance with bathing/showering or dressing?: No Do you need assistance with meal preparation?: No Do you need assistance with eating?: Yes Do you have difficulty maintaining continence: No Do you need assistance with getting out of bed/getting out of a chair/moving?:  Yes Do you have difficulty managing or taking your medications?: Yes  Follow up appointments reviewed: PCP Follow-up appointment confirmed?: No (declined, going back to rehab [facility]) MD Provider Line Number:[phone] Given: No Specialist Hospital Follow-up appointment confirmed?: NA Do you need  transportation to your follow-up appointment?: No Do you understand care options if your condition(s) worsen?: Yes-patient verbalized understanding    SIGNATURE Julian Lemmings, LPN [facility] Nurse Health Advisor Direct Dial [phone]
